# Patient Record
Sex: Female | Born: 1960
Health system: Southern US, Community
[De-identification: ages and names within clinical notes are randomized; demographics above are authoritative.]

## PROBLEM LIST (undated history)

## (undated) DIAGNOSIS — Z9889 Other specified postprocedural states: Secondary | ICD-10-CM

## (undated) DIAGNOSIS — I10 Essential (primary) hypertension: Secondary | ICD-10-CM

## (undated) DIAGNOSIS — K219 Gastro-esophageal reflux disease without esophagitis: Secondary | ICD-10-CM

## (undated) DIAGNOSIS — Z8679 Personal history of other diseases of the circulatory system: Secondary | ICD-10-CM

## (undated) DIAGNOSIS — D151 Benign neoplasm of heart: Secondary | ICD-10-CM

## (undated) DIAGNOSIS — F329 Major depressive disorder, single episode, unspecified: Secondary | ICD-10-CM

## (undated) DIAGNOSIS — M545 Low back pain, unspecified: Secondary | ICD-10-CM

## (undated) DIAGNOSIS — F419 Anxiety disorder, unspecified: Secondary | ICD-10-CM

## (undated) DIAGNOSIS — N393 Stress incontinence (female) (male): Secondary | ICD-10-CM

## (undated) DIAGNOSIS — R011 Cardiac murmur, unspecified: Secondary | ICD-10-CM

## (undated) DIAGNOSIS — M722 Plantar fascial fibromatosis: Secondary | ICD-10-CM

## (undated) DIAGNOSIS — M4800 Spinal stenosis, site unspecified: Secondary | ICD-10-CM

## (undated) DIAGNOSIS — G8929 Other chronic pain: Secondary | ICD-10-CM

## (undated) DIAGNOSIS — I513 Intracardiac thrombosis, not elsewhere classified: Secondary | ICD-10-CM

## (undated) DIAGNOSIS — A419 Sepsis, unspecified organism: Secondary | ICD-10-CM

## (undated) DIAGNOSIS — Z87442 Personal history of urinary calculi: Secondary | ICD-10-CM

## (undated) DIAGNOSIS — G43909 Migraine, unspecified, not intractable, without status migrainosus: Secondary | ICD-10-CM

## (undated) DIAGNOSIS — F32A Depression, unspecified: Secondary | ICD-10-CM

## (undated) DIAGNOSIS — I483 Typical atrial flutter: Secondary | ICD-10-CM

## (undated) DIAGNOSIS — I480 Paroxysmal atrial fibrillation: Secondary | ICD-10-CM

## (undated) DIAGNOSIS — J90 Pleural effusion, not elsewhere classified: Secondary | ICD-10-CM

## (undated) DIAGNOSIS — M1990 Unspecified osteoarthritis, unspecified site: Secondary | ICD-10-CM

## (undated) DIAGNOSIS — Z8719 Personal history of other diseases of the digestive system: Secondary | ICD-10-CM

## (undated) DIAGNOSIS — I499 Cardiac arrhythmia, unspecified: Secondary | ICD-10-CM

## (undated) DIAGNOSIS — M26609 Unspecified temporomandibular joint disorder, unspecified side: Secondary | ICD-10-CM

## (undated) HISTORY — DX: Typical atrial flutter: I48.3

## (undated) HISTORY — DX: Intracardiac thrombosis, not elsewhere classified: I51.3

## (undated) HISTORY — DX: Paroxysmal atrial fibrillation: I48.0

## (undated) HISTORY — PX: CARDIOVASCULAR STRESS TEST: SHX262

## (undated) HISTORY — PX: OTHER SURGICAL HISTORY: SHX169

---

## 1982-11-07 HISTORY — PX: APPENDECTOMY: SHX54

## 1989-07-08 HISTORY — PX: LASIK: SHX215

## 2003-08-11 ENCOUNTER — Encounter: Payer: Self-pay | Admitting: Family Medicine

## 2003-08-11 ENCOUNTER — Encounter: Admission: RE | Admit: 2003-08-11 | Discharge: 2003-08-11 | Payer: Self-pay | Admitting: Family Medicine

## 2006-08-31 ENCOUNTER — Encounter: Admission: RE | Admit: 2006-08-31 | Discharge: 2006-08-31 | Payer: Self-pay | Admitting: Gastroenterology

## 2006-11-17 ENCOUNTER — Encounter: Admission: RE | Admit: 2006-11-17 | Discharge: 2006-11-17 | Payer: Self-pay | Admitting: Gastroenterology

## 2006-12-27 ENCOUNTER — Encounter: Admission: RE | Admit: 2006-12-27 | Discharge: 2006-12-27 | Payer: Self-pay | Admitting: Gastroenterology

## 2007-03-05 ENCOUNTER — Ambulatory Visit (HOSPITAL_COMMUNITY): Admission: RE | Admit: 2007-03-05 | Discharge: 2007-03-05 | Payer: Self-pay | Admitting: Gastroenterology

## 2007-09-25 ENCOUNTER — Emergency Department (HOSPITAL_COMMUNITY): Admission: EM | Admit: 2007-09-25 | Discharge: 2007-09-25 | Payer: Self-pay | Admitting: Emergency Medicine

## 2008-11-12 ENCOUNTER — Encounter: Admission: RE | Admit: 2008-11-12 | Discharge: 2009-02-10 | Payer: Self-pay | Admitting: Orthopedic Surgery

## 2009-02-12 ENCOUNTER — Encounter: Admission: RE | Admit: 2009-02-12 | Discharge: 2009-05-04 | Payer: Self-pay | Admitting: Orthopedic Surgery

## 2009-03-03 ENCOUNTER — Encounter: Admission: RE | Admit: 2009-03-03 | Discharge: 2009-03-26 | Payer: Self-pay | Admitting: Orthopedic Surgery

## 2009-05-07 ENCOUNTER — Encounter: Admission: RE | Admit: 2009-05-07 | Discharge: 2009-08-05 | Payer: Self-pay | Admitting: Orthopedic Surgery

## 2009-07-17 ENCOUNTER — Encounter: Admission: RE | Admit: 2009-07-17 | Discharge: 2009-07-17 | Payer: Self-pay | Admitting: Orthopedic Surgery

## 2009-09-16 ENCOUNTER — Inpatient Hospital Stay (HOSPITAL_COMMUNITY): Admission: RE | Admit: 2009-09-16 | Discharge: 2009-09-19 | Payer: Self-pay | Admitting: Orthopedic Surgery

## 2009-09-16 HISTORY — PX: POSTERIOR FUSION LUMBAR SPINE: SUR632

## 2009-11-10 ENCOUNTER — Encounter: Admission: RE | Admit: 2009-11-10 | Discharge: 2010-02-08 | Payer: Self-pay | Admitting: Orthopedic Surgery

## 2010-01-11 ENCOUNTER — Encounter: Admission: RE | Admit: 2010-01-11 | Discharge: 2010-01-11 | Payer: Self-pay | Admitting: Orthopedic Surgery

## 2010-01-18 ENCOUNTER — Encounter: Admission: RE | Admit: 2010-01-18 | Discharge: 2010-01-18 | Payer: Self-pay | Admitting: Orthopedic Surgery

## 2010-04-08 ENCOUNTER — Ambulatory Visit (HOSPITAL_COMMUNITY): Admission: RE | Admit: 2010-04-08 | Discharge: 2010-04-10 | Payer: Self-pay | Admitting: Orthopedic Surgery

## 2010-04-08 HISTORY — PX: ANTERIOR CERVICAL DECOMP/DISCECTOMY FUSION: SHX1161

## 2010-06-10 ENCOUNTER — Encounter: Admission: RE | Admit: 2010-06-10 | Discharge: 2010-06-10 | Payer: Self-pay | Admitting: Orthopedic Surgery

## 2010-11-28 ENCOUNTER — Encounter: Payer: Self-pay | Admitting: Gastroenterology

## 2011-01-24 LAB — CBC
HCT: 37.3 % (ref 36.0–46.0)
Hemoglobin: 12.7 g/dL (ref 12.0–15.0)
MCV: 88.4 fL (ref 78.0–100.0)
Platelets: 231 10*3/uL (ref 150–400)
RBC: 4.23 MIL/uL (ref 3.87–5.11)

## 2011-02-09 LAB — HEMOGLOBIN AND HEMATOCRIT, BLOOD
HCT: 26.9 % — ABNORMAL LOW (ref 36.0–46.0)
Hemoglobin: 9.1 g/dL — ABNORMAL LOW (ref 12.0–15.0)
Hemoglobin: 9.7 g/dL — ABNORMAL LOW (ref 12.0–15.0)

## 2011-02-09 LAB — TYPE AND SCREEN
ABO/RH(D): A POS
Antibody Screen: NEGATIVE

## 2011-02-09 LAB — CBC
HCT: 27.6 % — ABNORMAL LOW (ref 36.0–46.0)
Hemoglobin: 12.7 g/dL (ref 12.0–15.0)
Hemoglobin: 8.6 g/dL — ABNORMAL LOW (ref 12.0–15.0)
MCHC: 34.4 g/dL (ref 30.0–36.0)
MCHC: 34.4 g/dL (ref 30.0–36.0)
MCV: 87.8 fL (ref 78.0–100.0)
MCV: 88.8 fL (ref 78.0–100.0)
RBC: 2.83 MIL/uL — ABNORMAL LOW (ref 3.87–5.11)
RBC: 3.14 MIL/uL — ABNORMAL LOW (ref 3.87–5.11)
RBC: 4.2 MIL/uL (ref 3.87–5.11)

## 2011-02-09 LAB — POCT I-STAT 4, (NA,K, GLUC, HGB,HCT)
Glucose, Bld: 113 mg/dL — ABNORMAL HIGH (ref 70–99)
Sodium: 134 mEq/L — ABNORMAL LOW (ref 135–145)

## 2011-02-09 LAB — URINALYSIS, ROUTINE W REFLEX MICROSCOPIC
Bilirubin Urine: NEGATIVE
Ketones, ur: NEGATIVE mg/dL
Nitrite: NEGATIVE
Urobilinogen, UA: 0.2 mg/dL (ref 0.0–1.0)

## 2011-02-09 LAB — URINE MICROSCOPIC-ADD ON

## 2011-03-02 ENCOUNTER — Other Ambulatory Visit: Payer: Self-pay | Admitting: Orthopedic Surgery

## 2011-03-02 DIAGNOSIS — M545 Low back pain, unspecified: Secondary | ICD-10-CM

## 2011-03-11 ENCOUNTER — Other Ambulatory Visit: Payer: Self-pay

## 2011-03-15 ENCOUNTER — Ambulatory Visit
Admission: RE | Admit: 2011-03-15 | Discharge: 2011-03-15 | Disposition: A | Discharge status: Home / Self Care | Payer: No Typology Code available for payment source | Source: Ambulatory Visit | Attending: Orthopedic Surgery | Admitting: Orthopedic Surgery

## 2011-03-15 DIAGNOSIS — M545 Low back pain, unspecified: Secondary | ICD-10-CM

## 2011-04-05 ENCOUNTER — Ambulatory Visit
Admission: RE | Admit: 2011-04-05 | Discharge: 2011-04-05 | Disposition: A | Discharge status: Home / Self Care | Payer: No Typology Code available for payment source | Source: Ambulatory Visit | Attending: Family Medicine | Admitting: Family Medicine

## 2011-04-05 ENCOUNTER — Other Ambulatory Visit: Payer: Self-pay | Admitting: Family Medicine

## 2011-04-05 DIAGNOSIS — R319 Hematuria, unspecified: Secondary | ICD-10-CM

## 2011-04-05 DIAGNOSIS — M549 Dorsalgia, unspecified: Secondary | ICD-10-CM

## 2011-04-05 DIAGNOSIS — N390 Urinary tract infection, site not specified: Secondary | ICD-10-CM

## 2011-04-05 DIAGNOSIS — R300 Dysuria: Secondary | ICD-10-CM

## 2011-07-28 ENCOUNTER — Encounter: Payer: Self-pay | Admitting: Internal Medicine

## 2011-08-08 ENCOUNTER — Ambulatory Visit: Payer: No Typology Code available for payment source

## 2011-08-11 ENCOUNTER — Ambulatory Visit: Payer: Medicaid Other | Attending: Orthopedic Surgery

## 2011-08-11 DIAGNOSIS — M25579 Pain in unspecified ankle and joints of unspecified foot: Secondary | ICD-10-CM | POA: Insufficient documentation

## 2011-08-11 DIAGNOSIS — R5381 Other malaise: Secondary | ICD-10-CM | POA: Insufficient documentation

## 2011-08-11 DIAGNOSIS — IMO0001 Reserved for inherently not codable concepts without codable children: Secondary | ICD-10-CM | POA: Insufficient documentation

## 2011-08-16 LAB — URINE MICROSCOPIC-ADD ON

## 2011-08-16 LAB — URINALYSIS, ROUTINE W REFLEX MICROSCOPIC
Glucose, UA: NEGATIVE
Ketones, ur: 15 — AB
Leukocytes, UA: NEGATIVE
Protein, ur: 100 — AB

## 2011-08-16 LAB — PREGNANCY, URINE: Preg Test, Ur: NEGATIVE

## 2011-08-22 ENCOUNTER — Ambulatory Visit: Payer: Medicaid Other

## 2011-08-29 ENCOUNTER — Encounter: Payer: Medicaid Other | Admitting: Physical Therapy

## 2011-08-31 ENCOUNTER — Ambulatory Visit: Payer: Medicaid Other

## 2011-09-05 ENCOUNTER — Ambulatory Visit: Payer: Medicaid Other

## 2011-11-26 ENCOUNTER — Emergency Department (HOSPITAL_BASED_OUTPATIENT_CLINIC_OR_DEPARTMENT_OTHER)
Admission: EM | Admit: 2011-11-26 | Discharge: 2011-11-26 | Disposition: A | Discharge status: Home / Self Care | Payer: Medicaid Other | Attending: Emergency Medicine | Admitting: Emergency Medicine

## 2011-11-26 ENCOUNTER — Encounter (HOSPITAL_BASED_OUTPATIENT_CLINIC_OR_DEPARTMENT_OTHER): Payer: Self-pay | Admitting: Emergency Medicine

## 2011-11-26 DIAGNOSIS — J069 Acute upper respiratory infection, unspecified: Secondary | ICD-10-CM | POA: Insufficient documentation

## 2011-11-26 DIAGNOSIS — G8929 Other chronic pain: Secondary | ICD-10-CM | POA: Insufficient documentation

## 2011-11-26 DIAGNOSIS — M542 Cervicalgia: Secondary | ICD-10-CM | POA: Insufficient documentation

## 2011-11-26 DIAGNOSIS — M5030 Other cervical disc degeneration, unspecified cervical region: Secondary | ICD-10-CM | POA: Insufficient documentation

## 2011-11-26 DIAGNOSIS — H9209 Otalgia, unspecified ear: Secondary | ICD-10-CM | POA: Insufficient documentation

## 2011-11-26 MED ORDER — FEXOFENADINE-PSEUDOEPHED ER 60-120 MG PO TB12: 1.0000 | ORAL_TABLET | Freq: Two times a day (BID) | ORAL | Status: DC

## 2011-11-26 MED ORDER — DEXAMETHASONE SODIUM PHOSPHATE 10 MG/ML IJ SOLN
10.0000 mg | Freq: Once | INTRAMUSCULAR | Status: AC
  Administered 2011-11-26: 10 mg via INTRAMUSCULAR
  Filled 2011-11-26: qty 1

## 2011-11-26 MED ORDER — HYDROMORPHONE HCL PF 1 MG/ML IJ SOLN
1.0000 mg | Freq: Once | INTRAMUSCULAR | Status: AC
  Administered 2011-11-26: 1 mg via INTRAMUSCULAR
  Filled 2011-11-26: qty 1

## 2011-11-26 MED ORDER — CARISOPRODOL 350 MG PO TABS: 350.0000 mg | ORAL_TABLET | Freq: Three times a day (TID) | ORAL | Status: DC

## 2011-11-26 MED ORDER — METAXALONE 800 MG PO TABS: 800.0000 mg | ORAL_TABLET | Freq: Three times a day (TID) | ORAL | Status: AC

## 2011-11-26 MED ORDER — DIAZEPAM 5 MG PO TABS
5.0000 mg | ORAL_TABLET | Freq: Once | ORAL | Status: AC
  Administered 2011-11-26: 5 mg via ORAL
  Filled 2011-11-26: qty 1

## 2011-11-26 NOTE — ED Provider Notes (Addendum)
History     CSN: [id number]  Arrival date & time 11/26/11  4098   First MD Initiated Contact with Patient 11/26/11 0920      Chief Complaint  Patient presents with  . Neck Pain    (Consider location/radiation/quality/duration/timing/severity/associated sxs/prior treatment) HPI Comments: Patient has a history of prior neck and back pain she sees Dr. Shon Baton at [facility] orthopedics for management of this she has had surgery on both her neck and her back. She is surgery on her neck for degenerative joint disease. She is currently on oxycodone 15 mg tablets prescribed by Dr. Shon Baton for her lower back. She complains today of pain throughout her neck on both sides of her neck. This started yesterday afternoon. It has progressively gotten worse since then. Denies any recent injury. Denies any radiation down her arms. Denies any numbness or weakness in her arms. Denies any fevers. She does complain of some nasal congestion and pain in her ears for about the last 3-4 days. Denies any significant cough. Denied any headache. She has tried taking her oxycodone for her neck pain, but this has not relieved it. Her neck pain is worse with any movement.  Patient is a 51 y.o. female presenting with neck pain. The history is provided by the patient.  Neck Pain  This is a recurrent problem. Pertinent negatives include no chest pain, no numbness, no headaches and no weakness.    Past Medical History  Diagnosis Date  . DDD (degenerative disc disease), cervical   . Back pain, chronic     Past Surgical History  Procedure Date  . Neck surgery   . Back surgery     History reviewed. No pertinent family history.  History  Substance Use Topics  . Smoking status: Never Smoker   . Smokeless tobacco: Never Used  . Alcohol Use: No    OB History    Grav Para Term Preterm Abortions TAB SAB Ect Mult Living                  Review of Systems  Constitutional: Negative for fever, chills, diaphoresis and  fatigue.  HENT: Positive for ear pain, congestion, rhinorrhea, neck pain and postnasal drip. Negative for sneezing.   Eyes: Negative.   Respiratory: Negative for cough, chest tightness and shortness of breath.   Cardiovascular: Negative for chest pain and leg swelling.  Gastrointestinal: Negative for nausea, vomiting, abdominal pain, diarrhea and blood in stool.  Genitourinary: Negative for frequency, hematuria, flank pain and difficulty urinating.  Musculoskeletal: Negative for back pain and arthralgias.  Skin: Negative for rash.  Neurological: Negative for dizziness, speech difficulty, weakness, numbness and headaches.    Allergies  Review of patient's allergies indicates no known allergies.  Home Medications   Current Outpatient Rx  Name Route Sig Dispense Refill  . OXYCODONE HCL 15 MG PO TABS Oral Take 15 mg by mouth every 4 (four) hours as needed.    Marland Kitchen FEXOFENADINE-PSEUDOEPHED ER 60-120 MG PO TB12 Oral Take 1 tablet by mouth every 12 (twelve) hours. 30 tablet 0  . METAXALONE 800 MG PO TABS Oral Take 1 tablet (800 mg total) by mouth 3 (three) times daily. 20 tablet 0    BP 134/89  Pulse 76  Temp(Src) 98 F (36.7 C) (Oral)  Resp 18  SpO2 98%  LMP 11/26/2011  Physical Exam  Constitutional: She is oriented to person, place, and time. She appears well-developed and well-nourished.  HENT:  Head: Normocephalic and atraumatic.  Right Ear:  External ear normal.  Left Ear: External ear normal.  Nose: Nose normal.  Mouth/Throat: Oropharynx is clear and moist.  Eyes: Pupils are equal, round, and reactive to light.  Neck: Normal range of motion. Neck supple.       Diffuse pain on palpation throughout posterior neck area. Mostly along the musculature bilaterally. And across the trapezius muscles bilaterally. There is also tenderness throughout the cervical spine. No step-offs or deformities are noted. No swelling erythema or warmth is noted.  Cardiovascular: Normal rate, regular  rhythm and normal heart sounds.   Pulmonary/Chest: Effort normal and breath sounds normal. No respiratory distress. She has no wheezes. She has no rales. She exhibits no tenderness.  Abdominal: Soft. Bowel sounds are normal. There is no tenderness. There is no rebound and no guarding.  Musculoskeletal: Normal range of motion. She exhibits no edema.  Lymphadenopathy:    She has no cervical adenopathy.  Neurological: She is alert and oriented to person, place, and time.       Normal motor function in all extremities. Normal sensation in all extremities. Radial pulses are 2+ bilaterally.  Skin: Skin is warm and dry. No rash noted.  Psychiatric: She has a normal mood and affect.    ED Course  Procedures (including critical care time)  Labs Reviewed - No data to display No results found.   1. Neck pain, musculoskeletal   2. URI (upper respiratory infection)       MDM  Patient has been given Dilaudid and Valium in the emergency department as well as a shot of Decadron. She does complain of some improvement in symptoms. She has no neurologic deficits no radiation of the pain down her arm. I do not feel that emergent MRI is needed. She does see Dr. Shon Baton with [facility] orthopedics. Advised patient to followup with her orthopedist on Monday return here for any worsening symptoms over the weekend. She will continue taking her pain medicine at home and I will also prescribe her a muscle relaxer.       Rolan Bucco, MD 11/26/11 4782  Rolan Bucco, MD 11/26/11 1209

## 2011-11-26 NOTE — ED Notes (Signed)
Nasal congestion, earache, since Tuesday.  Now having severe neck pain.  Denies fever.

## 2011-11-28 ENCOUNTER — Other Ambulatory Visit: Payer: Self-pay | Admitting: Orthopedic Surgery

## 2011-11-28 ENCOUNTER — Ambulatory Visit
Admission: RE | Admit: 2011-11-28 | Discharge: 2011-11-28 | Disposition: A | Discharge status: Home / Self Care | Payer: Medicaid Other | Source: Ambulatory Visit | Attending: Orthopedic Surgery | Admitting: Orthopedic Surgery

## 2011-11-28 ENCOUNTER — Telehealth (HOSPITAL_BASED_OUTPATIENT_CLINIC_OR_DEPARTMENT_OTHER): Payer: Self-pay | Admitting: *Deleted

## 2011-11-28 DIAGNOSIS — R52 Pain, unspecified: Secondary | ICD-10-CM

## 2011-11-28 NOTE — ED Notes (Signed)
Flexeril 5mg  po tid for muscle spasm, 15 tabs no refills, per Dr. Alden Server.  Insurance will not cover Skelexin.

## 2012-04-02 ENCOUNTER — Inpatient Hospital Stay (HOSPITAL_COMMUNITY)
Admission: EM | Admit: 2012-04-02 | Discharge: 2012-04-03 | DRG: 309 | Disposition: A | Discharge status: Home / Self Care | Payer: Medicare Other | Attending: Cardiovascular Disease | Admitting: Cardiovascular Disease

## 2012-04-02 ENCOUNTER — Emergency Department (HOSPITAL_COMMUNITY): Payer: Medicare Other

## 2012-04-02 ENCOUNTER — Encounter (HOSPITAL_COMMUNITY): Payer: Self-pay | Admitting: Neurology

## 2012-04-02 ENCOUNTER — Encounter: Payer: Self-pay | Admitting: Internal Medicine

## 2012-04-02 DIAGNOSIS — I4891 Unspecified atrial fibrillation: Principal | ICD-10-CM | POA: Diagnosis present

## 2012-04-02 DIAGNOSIS — M549 Dorsalgia, unspecified: Secondary | ICD-10-CM | POA: Diagnosis present

## 2012-04-02 DIAGNOSIS — G43909 Migraine, unspecified, not intractable, without status migrainosus: Secondary | ICD-10-CM | POA: Diagnosis present

## 2012-04-02 DIAGNOSIS — M545 Low back pain, unspecified: Secondary | ICD-10-CM | POA: Insufficient documentation

## 2012-04-02 DIAGNOSIS — Z9089 Acquired absence of other organs: Secondary | ICD-10-CM

## 2012-04-02 DIAGNOSIS — M797 Fibromyalgia: Secondary | ICD-10-CM | POA: Insufficient documentation

## 2012-04-02 DIAGNOSIS — N390 Urinary tract infection, site not specified: Secondary | ICD-10-CM | POA: Diagnosis present

## 2012-04-02 DIAGNOSIS — M5030 Other cervical disc degeneration, unspecified cervical region: Secondary | ICD-10-CM | POA: Insufficient documentation

## 2012-04-02 DIAGNOSIS — IMO0001 Reserved for inherently not codable concepts without codable children: Secondary | ICD-10-CM | POA: Diagnosis present

## 2012-04-02 DIAGNOSIS — I498 Other specified cardiac arrhythmias: Secondary | ICD-10-CM | POA: Diagnosis not present

## 2012-04-02 DIAGNOSIS — K219 Gastro-esophageal reflux disease without esophagitis: Secondary | ICD-10-CM | POA: Diagnosis present

## 2012-04-02 HISTORY — DX: Other chronic pain: G89.29

## 2012-04-02 HISTORY — DX: Low back pain: M54.5

## 2012-04-02 HISTORY — DX: Gastro-esophageal reflux disease without esophagitis: K21.9

## 2012-04-02 HISTORY — DX: Low back pain, unspecified: M54.50

## 2012-04-02 HISTORY — DX: Migraine, unspecified, not intractable, without status migrainosus: G43.909

## 2012-04-02 LAB — COMPREHENSIVE METABOLIC PANEL
ALT: 10 U/L (ref 0–35)
AST: 13 U/L (ref 0–37)
CO2: 25 mEq/L (ref 19–32)
Chloride: 105 mEq/L (ref 96–112)
GFR calc non Af Amer: >90 mL/min (ref >90)
Sodium: 137 mEq/L (ref 135–145)
Total Bilirubin: 0.3 mg/dL (ref 0.3–1.2)

## 2012-04-02 LAB — APTT: aPTT: 24 seconds (ref 24–37)

## 2012-04-02 LAB — HEPARIN LEVEL (UNFRACTIONATED): Heparin Unfractionated: 0.44 IU/mL (ref 0.30–0.70)

## 2012-04-02 LAB — CARDIAC PANEL(CRET KIN+CKTOT+MB+TROPI)
CK, MB: 1.5 ng/mL (ref 0.3–4.0)
Total CK: 32 U/L (ref 7–177)

## 2012-04-02 LAB — DIFFERENTIAL
Basophils Absolute: 0 10*3/uL (ref 0.0–0.1)
Lymphocytes Relative: 38 % (ref 12–46)
Neutro Abs: 3.8 10*3/uL (ref 1.7–7.7)

## 2012-04-02 LAB — URINALYSIS, ROUTINE W REFLEX MICROSCOPIC
Bilirubin Urine: NEGATIVE
Protein, ur: 100 mg/dL — AB
Specific Gravity, Urine: 1.016 (ref 1.005–1.030)
Urobilinogen, UA: 1 mg/dL (ref 0.0–1.0)

## 2012-04-02 LAB — CBC
Platelets: 252 10*3/uL (ref 150–400)
RDW: 13.4 % (ref 11.5–15.5)
WBC: 6.8 10*3/uL (ref 4.0–10.5)

## 2012-04-02 LAB — URINE MICROSCOPIC-ADD ON

## 2012-04-02 LAB — PROTIME-INR: INR: 0.96 (ref 0.00–1.49)

## 2012-04-02 LAB — TSH: TSH: 1.954 u[IU]/mL (ref 0.350–4.500)

## 2012-04-02 MED ORDER — SODIUM CHLORIDE 0.9 % IJ SOLN: 3.0000 mL | INTRAMUSCULAR | Status: DC | PRN

## 2012-04-02 MED ORDER — ALPRAZOLAM 0.25 MG PO TABS
0.2500 mg | ORAL_TABLET | Freq: Two times a day (BID) | ORAL | Status: DC | PRN
  Administered 2012-04-03: 0.25 mg via ORAL
  Filled 2012-04-02: qty 1

## 2012-04-02 MED ORDER — ZOLPIDEM TARTRATE 5 MG PO TABS
5.0000 mg | ORAL_TABLET | Freq: Every evening | ORAL | Status: DC | PRN
  Administered 2012-04-02: 5 mg via ORAL
  Filled 2012-04-02: qty 1

## 2012-04-02 MED ORDER — ACETAMINOPHEN 325 MG PO TABS
650.0000 mg | ORAL_TABLET | ORAL | Status: DC | PRN
  Administered 2012-04-02: 650 mg via ORAL
  Filled 2012-04-02: qty 2

## 2012-04-02 MED ORDER — DILTIAZEM HCL 100 MG IV SOLR
5.0000 mg/h | Freq: Once | INTRAVENOUS | Status: AC
  Administered 2012-04-02: 5 mg/h via INTRAVENOUS

## 2012-04-02 MED ORDER — HEPARIN BOLUS VIA INFUSION
4000.0000 [IU] | Freq: Once | INTRAVENOUS | Status: AC
  Administered 2012-04-02: 4000 [IU] via INTRAVENOUS

## 2012-04-02 MED ORDER — DILTIAZEM HCL 25 MG/5ML IV SOLN
INTRAVENOUS | Status: AC
  Filled 2012-04-02: qty 5

## 2012-04-02 MED ORDER — DILTIAZEM HCL 50 MG/10ML IV SOLN
10.0000 mg | Freq: Once | INTRAVENOUS | Status: AC
  Administered 2012-04-02: 10 mg via INTRAVENOUS

## 2012-04-02 MED ORDER — HEPARIN (PORCINE) IN NACL 100-0.45 UNIT/ML-% IJ SOLN
1100.0000 [IU]/h | INTRAMUSCULAR | Status: DC
  Administered 2012-04-02: 1100 [IU]/h via INTRAVENOUS
  Filled 2012-04-02 (×3): qty 250

## 2012-04-02 MED ORDER — NITROGLYCERIN 0.4 MG SL SUBL: 0.4000 mg | SUBLINGUAL_TABLET | SUBLINGUAL | Status: DC | PRN

## 2012-04-02 MED ORDER — LORAZEPAM 0.5 MG PO TABS: 0.5000 mg | ORAL_TABLET | Freq: Every evening | ORAL | Status: DC | PRN

## 2012-04-02 MED ORDER — SODIUM CHLORIDE 0.9 % IV SOLN: 250.0000 mL | INTRAVENOUS | Status: DC | PRN

## 2012-04-02 MED ORDER — SODIUM CHLORIDE 0.9 % IV BOLUS (SEPSIS)
500.0000 mL | Freq: Once | INTRAVENOUS | Status: AC
  Administered 2012-04-02: 500 mL via INTRAVENOUS

## 2012-04-02 MED ORDER — IOHEXOL 350 MG/ML SOLN
100.0000 mL | Freq: Once | INTRAVENOUS | Status: AC | PRN
  Administered 2012-04-02: 100 mL via INTRAVENOUS

## 2012-04-02 MED ORDER — CIPROFLOXACIN HCL 500 MG PO TABS
500.0000 mg | ORAL_TABLET | Freq: Two times a day (BID) | ORAL | Status: DC
  Administered 2012-04-02 – 2012-04-03 (×2): 500 mg via ORAL
  Filled 2012-04-02 (×4): qty 1

## 2012-04-02 MED ORDER — ADULT MULTIVITAMIN W/MINERALS CH
1.0000 | ORAL_TABLET | Freq: Every day | ORAL | Status: DC
  Administered 2012-04-03: 1 via ORAL
  Filled 2012-04-02: qty 1

## 2012-04-02 MED ORDER — ACETAMINOPHEN 325 MG PO TABS
650.0000 mg | ORAL_TABLET | Freq: Once | ORAL | Status: AC
  Administered 2012-04-02: 650 mg via ORAL
  Filled 2012-04-02: qty 2

## 2012-04-02 MED ORDER — DILTIAZEM HCL 100 MG IV SOLR
10.0000 mg/h | INTRAVENOUS | Status: DC
  Administered 2012-04-02: 20 mg/h via INTRAVENOUS
  Administered 2012-04-03 (×2): 10 mg/h via INTRAVENOUS
  Filled 2012-04-02 (×2): qty 100

## 2012-04-02 MED ORDER — OFF THE BEAT BOOK
Freq: Once | Status: AC
  Administered 2012-04-02: 17:00:00
  Filled 2012-04-02: qty 1

## 2012-04-02 MED ORDER — METOPROLOL TARTRATE 25 MG PO TABS
25.0000 mg | ORAL_TABLET | Freq: Two times a day (BID) | ORAL | Status: DC
  Administered 2012-04-02: 25 mg via ORAL
  Filled 2012-04-02 (×4): qty 1

## 2012-04-02 MED ORDER — METOPROLOL TARTRATE 1 MG/ML IV SOLN: 2.5000 mg | Freq: Four times a day (QID) | INTRAVENOUS | Status: DC | PRN

## 2012-04-02 MED ORDER — ONDANSETRON HCL 4 MG/2ML IJ SOLN: 4.0000 mg | Freq: Four times a day (QID) | INTRAMUSCULAR | Status: DC | PRN

## 2012-04-02 MED ORDER — OXYCODONE HCL 5 MG PO TABS
15.0000 mg | ORAL_TABLET | ORAL | Status: DC | PRN
  Administered 2012-04-02 – 2012-04-03 (×3): 15 mg via ORAL
  Filled 2012-04-02 (×3): qty 3

## 2012-04-02 MED ORDER — SODIUM CHLORIDE 0.9 % IJ SOLN
3.0000 mL | Freq: Two times a day (BID) | INTRAMUSCULAR | Status: DC
  Administered 2012-04-03: 3 mL via INTRAVENOUS

## 2012-04-02 NOTE — ED Notes (Signed)
Patient transported to X-ray. HR 112. A x 4. Talking, denying any cp or sob.

## 2012-04-02 NOTE — ED Notes (Signed)
Patient transported to CT. Pt a x 4. HR 121

## 2012-04-02 NOTE — H&P (Signed)
 CARDIOLOGY CONSULT NOTE   Patient ID: Miranda Taylor MRN: [id number] DOB/AGE: 51/30/1962 50 y.o.  Admit date: 04/02/2012  Primary Physician   [facility], Dr Miranda Taylor Primary Cardiologist   none Reason for Consultation   Atrial fib  HPI:Miranda Taylor is a 50 y.o. female without a history of CAD.  She has been feeling poorly for about a week. She complains of general malaise, fatigue and also had urinary frequency with burning. She took some medication she had at home which seemed to help, but her symptoms returned and she saw her family physician today. Her heart rate was noted to be elevated and irregular so she was sent to the emergency room.   In the emergency room, she was found to be in atrial fibrillation with rapid ventricular response. Her heart rate has improved on IV Cardizem, but is still significantly elevated between 101 and 150. She has never had palpitations. She has no history of presyncope or syncope. She has not had any orthostatic symptoms. She did not know her heart rate was elevated or that it was irregular until told by her family physician's staff. She has not had dyspnea on exertion or any chest pain. In the emergency room, she is resting comfortably.   Past Medical History  Diagnosis Date  . DDD (degenerative disc disease), cervical   . Back pain, chronic     Past Surgical History  Procedure Date  . Neck surgery   . Back surgery   . Appendectomy     No Known Allergies  I have reviewed the patient's current medications    . diltiazem (CARDIZEM) infusion  5-15 mg/hr Intravenous Once  . diltiazem  10 mg Intravenous Once  . sodium chloride  500 mL Intravenous Once     iohexol  Prior to Admission medications   Medication Sig Start Date End Date Taking? Authorizing Provider  LORazepam (ATIVAN) 0.5 MG tablet Take 0.5 mg by mouth at bedtime as needed. For sleep   Yes Historical Provider, MD  Multiple Vitamin (MULITIVITAMIN WITH  MINERALS) TABS Take 1 tablet by mouth daily.   Yes Historical Provider, MD  oxyCODONE (ROXICODONE) 15 MG immediate release tablet Take 15 mg by mouth every 4 (four) hours as needed. For pain   Yes Historical Provider, MD     History   Social History  . Marital Status: Married    Spouse Name: N/A    Number of Children: N/A  . Years of Education: N/A   Occupational History  . Disabled    Social History Main Topics  . Smoking status: Former Smoker -- 0.5 packs/day for 20 years    Types: Cigarettes  . Smokeless tobacco: Never Used  . Alcohol Use: No  . Drug Use: Not on file  . Sexually Active: Not on file   Social History Narrative   Pt lives with husband. Both parents are living, one with cardiac issues. Father has arrhythmia and is on blood thinners. Mother and sibs without cardiac issues.   ROS: She has issues with chronic musculoskeletal aches and pains. The UTI symptoms are described above but she denies fevers or chills. She has not seen blood in her urine although the doctor's office told her there was some. She gets occasional reflux symptoms from specific foods but these are rare. She denies melena or other GI problems. Full 14 point review of systems complete and found to be negative unless listed above.  Physical Exam: Blood pressure 114/67, pulse   110, temperature 98.6 F (37 C), temperature source Oral, resp. rate 18, last menstrual period 03/19/2012, SpO2 98.00%.  General: Well developed, well nourished, female in no acute distress Head: Eyes PERRLA, No xanthomas.   Normocephalic and atraumatic, oropharynx without edema or exudate. Dentition good Lungs: Clear bilaterally  Heart: Heart rapid and irregular rate and rhythm with S1, S2, no significant  murmur. pulses are 2+ all 4 extrem.   Neck: No carotid bruits. No lymphadenopathy.  JVD not elevated. Abdomen: Bowel sounds present, abdomen soft and tender in the mid-upper area as well as in the lower abdomen but is without  masses or hernias noted. Msk:  No spine or cva tenderness. No weakness, no joint deformities or effusions. Extremities: No clubbing or cyanosis. No edema.  Neuro: Alert and oriented X 3. No focal deficits noted. Psych:  Good affect, responds appropriately Skin: No rashes or lesions noted.  Labs:   Lab Results  Component Value Date   WBC 6.8 04/02/2012   HGB 12.3 04/02/2012   HCT 36.6 04/02/2012   MCV 85.3 04/02/2012   PLT 252 04/02/2012    Basename 04/02/12 1130  INR 0.96    Lab 04/02/12 1130  NA 137  K 4.3  CL 105  CO2 25  BUN 14  CREATININE 0.76  CALCIUM 9.4  PROT 7.0  BILITOT 0.3  ALKPHOS 79  ALT 10  AST 13  GLUCOSE 86    Basename 04/02/12 1130  CKTOTAL 32  CKMB 1.5  TROPONINI <0.30   Lab Results  Component Value Date   DDIMER 0.63* 04/02/2012   ECG:  02-Apr-2012 11:20:46  Atrial fibrillation with rapid ventricular response Vent. rate 144 BPM PR interval * ms QRS duration 74 ms QT/QTc 284/439 ms P-R-T axes * 50 63  Radiology:  Dg Chest 2 View 04/02/2012  *RADIOLOGY REPORT*  Clinical Data: New onset of atrial fibrillation.  CHEST - 2 VIEW  Comparison: None.  Findings: The heart size and pulmonary vascularity are normal and the lungs are clear.  No effusions.  No osseous abnormality.  IMPRESSION: Normal chest.  Original Report Authenticated By: Ravenscroft H. MAXWELL, M.D.   Ct Angio Chest W/cm &/or Wo Cm 04/02/2012  *RADIOLOGY REPORT*  Clinical Data: Back pain  CT ANGIOGRAPHY CHEST  Technique:  Multidetector CT imaging of the chest using the standard protocol during bolus administration of intravenous contrast. Multiplanar reconstructed images including MIPs were obtained and reviewed to evaluate the vascular anatomy.  Contrast: 100mL OMNIPAQUE IOHEXOL 350 MG/ML SOLN  Comparison: None.  Findings: Respiratory motion artifact limits the examination.  No obvious filling defects in the pulmonary arterial tree to suggest acute pulmonary thromboembolism.  Negative abnormal  mediastinal adenopathy. Small mediastinal and hilar nodes are present.  Calcified thyroid lesions bilaterally.  No pneumothorax.  No pleural fluid.  Large simple cyst in the right kidney.  IMPRESSION: No evidence of acute pulmonary thromboembolism.  Calcified thyroid masses.  Thyroid ultrasound is recommended.  Original Report Authenticated By: ARTHUR A. HOSS, M.D.    ASSESSMENT AND PLAN:   The patient was seen today by Dr , the patient evaluated and the data reviewed.  1. atrial fibrillation with rapid ventricular response: Her rate has improved with IV Cardizem which we can titrate as her blood pressure will allow. She will be anticoagulated with heparin. She may need an antiarrhythmic, if she does not convert spontaneously. The duration is unknown, so if she needs cardioversion, a TEE will be needed.  2. UTI: Her family physician started   her on Cipro and has sent a culture. We will continue the Cipro and we will followup on the culture results if the patient remains hospitalized long enough period  3. Anticoagulation: We will initially use heparin. Her chads score is 0. If we do not cardiovert her, aspirin only. If she is cardioverted, she will need anticoagulation, M.D. advise on Coumadin versus Xarelto or Pradaxa.  4. chronic back pain: She will be continued on her home medications.  Signed: Rhonda Barrett 04/02/2012, 2:32 PM Co-Sign MD    Attending Note:   The patient was seen and examined.  Agree with assessment and plan as noted above.  1. Atrial fibrillation:  Pt is fairly asymptomatic.  She has not felt well for a week but did not know that her heart was irregular. Agree with heparin for now.  Since she is basically asymptomatic, we could consider rate control and anticoagulation for a month and then proceed with cardioversion instead of a several day  Hospitalization. Continue dilt drip for now.  Add metoprolol as needed / as tolerated for additional rate control.  Could use  xarelto instead of coumadin if patient can afford it  2. Urinary tract infection:  This may be exacerbating her atrial fibrillation.  Continue cipro for now.    J. , Jr., MD, FACC 04/02/2012, 4:02 PM   

## 2012-04-02 NOTE — ED Provider Notes (Addendum)
History     CSN: [id number]  Arrival date & time 04/02/12  1101   First MD Initiated Contact with Patient 04/02/12 1116      Chief Complaint  Patient presents with  . Atrial Fibrillation    (Consider location/radiation/quality/duration/timing/severity/associated sxs/prior treatment) HPI Pt referred from her PMD's office for new onset afib. Pt states she has been fatigued x 2 weeks with urinary frequency and dysuria. No fever chills. No CP, SOB palpitations. No prev history of arrythmia. No leg swelling or pain. No recent travel or surgery.  Past Medical History  Diagnosis Date  . DDD (degenerative disc disease), cervical   . Back pain, chronic     Past Surgical History  Procedure Date  . Neck surgery   . Back surgery   . Appendectomy     No family history on file.  History  Substance Use Topics  . Smoking status: Former Smoker -- 0.5 packs/day for 20 years    Types: Cigarettes  . Smokeless tobacco: Never Used  . Alcohol Use: No    OB History    Grav Para Term Preterm Abortions TAB SAB Ect Mult Living                  Review of Systems  Constitutional: Negative for fever and chills.  Respiratory: Negative for cough and shortness of breath.   Cardiovascular: Negative for chest pain, palpitations and leg swelling.  Gastrointestinal: Negative for nausea, vomiting and abdominal pain.  Genitourinary: Positive for dysuria and frequency. Negative for flank pain.  Musculoskeletal: Negative for back pain.  Skin: Negative for rash and wound.  Neurological: Negative for dizziness, weakness, light-headedness and numbness.    Allergies  Review of patient's allergies indicates no known allergies.  Home Medications   Current Outpatient Rx  Name Route Sig Dispense Refill  . LORAZEPAM 0.5 MG PO TABS Oral Take 0.5 mg by mouth at bedtime as needed. For sleep    . ADULT MULTIVITAMIN W/MINERALS CH Oral Take 1 tablet by mouth daily.    . OXYCODONE HCL 15 MG PO TABS Oral  Take 15 mg by mouth every 4 (four) hours as needed. For pain      BP 114/67  Pulse 110  Temp(Src) 98.6 F (37 C) (Oral)  Resp 18  Ht 5\' 7"  (1.702 m)  Wt 165 lb (74.844 kg)  BMI 25.84 kg/m2  SpO2 98%  LMP 03/19/2012  Physical Exam  Nursing note and vitals reviewed. Constitutional: She is oriented to person, place, and time. She appears well-developed and well-nourished. No distress.  HENT:  Head: Normocephalic and atraumatic.  Mouth/Throat: Oropharynx is clear and moist.  Eyes: EOM are normal. Pupils are equal, round, and reactive to light.  Neck: Normal range of motion. Neck supple. No thyromegaly present.  Cardiovascular:       irreg irreg  Pulmonary/Chest: Effort normal and breath sounds normal. No respiratory distress. She has no wheezes. She has no rales.  Abdominal: Soft. Bowel sounds are normal. There is no tenderness. There is no rebound and no guarding.  Musculoskeletal: Normal range of motion. She exhibits no edema and no tenderness.       No calf tenderness or swelling  Neurological: She is alert and oriented to person, place, and time.       5/5 motor, sensation intact  Skin: Skin is warm and dry. No rash noted. No erythema.  Psychiatric: She has a normal mood and affect. Her behavior is normal.    ED  Course  Procedures (including critical care time)  Labs Reviewed  COMPREHENSIVE METABOLIC PANEL - Abnormal; Notable for the following:    Albumin 3.0 (*)    All other components within normal limits  URINALYSIS, ROUTINE W REFLEX MICROSCOPIC - Abnormal; Notable for the following:    APPearance HAZY (*)    Hgb urine dipstick MODERATE (*)    Protein, ur 100 (*)    Leukocytes, UA SMALL (*)    All other components within normal limits  D-DIMER, QUANTITATIVE - Abnormal; Notable for the following:    D-Dimer, Quant 0.63 (*)    All other components within normal limits  URINE MICROSCOPIC-ADD ON - Abnormal; Notable for the following:    Squamous Epithelial / LPF FEW  (*)    All other components within normal limits  CBC  DIFFERENTIAL  APTT  PROTIME-INR  CARDIAC PANEL(CRET KIN+CKTOT+MB+TROPI)  TSH  T4, FREE  HEPARIN LEVEL (UNFRACTIONATED)   Dg Chest 2 View  04/02/2012  *RADIOLOGY REPORT*  Clinical Data: New onset of atrial fibrillation.  CHEST - 2 VIEW  Comparison: None.  Findings: The heart size and pulmonary vascularity are normal and the lungs are clear.  No effusions.  No osseous abnormality.  IMPRESSION: Normal chest.  Original Report Authenticated By: Gwynn Burly, M.D.   Ct Angio Chest W/cm &/or Wo Cm  04/02/2012  *RADIOLOGY REPORT*  Clinical Data: Back pain  CT ANGIOGRAPHY CHEST  Technique:  Multidetector CT imaging of the chest using the standard protocol during bolus administration of intravenous contrast. Multiplanar reconstructed images including MIPs were obtained and reviewed to evaluate the vascular anatomy.  Contrast: OMNIPAQUE IOHEXOL 350 MG/ML SOLN  Comparison: None.  Findings: Respiratory motion artifact limits the examination.  No obvious filling defects in the pulmonary arterial tree to suggest acute pulmonary thromboembolism.  Negative abnormal mediastinal adenopathy. Small mediastinal and hilar nodes are present.  Calcified thyroid lesions bilaterally.  No pneumothorax.  No pleural fluid.  Large simple cyst in the right kidney.  IMPRESSION: No evidence of acute pulmonary thromboembolism.  Calcified thyroid masses.  Thyroid ultrasound is recommended.  Original Report Authenticated By: Donavan Burnet, M.D.     1. Atrial fibrillation with rapid ventricular response       Date: 04/02/2012  Rate: 144  Rhythm: atrial fibrillation  QRS Axis: normal  Intervals: normal  ST/T Wave abnormalities: normal  Conduction Disutrbances:none  Narrative Interpretation:   Old EKG Reviewed: none available   MDM   Discussed with cardiology. Will see in ED.       Loren Racer, MD 04/02/12 1517  Loren Racer, MD 04/02/12  1538

## 2012-04-02 NOTE — Progress Notes (Signed)
ANTICOAGULATION CONSULT NOTE - Initial Consult  Pharmacy Consult for Heparin Indication: atrial fibrillation  No Known Allergies  Patient Measurements: Wt: 75Kg, Ht: 67", IBW: 62 Kg Heparin Dosing Weight: 75Kg  Vital Signs: Temp: 98.6 F (37 C) (05/27 1114) Temp src: Oral (05/27 1114) BP: 114/67 mmHg (05/27 1411) Pulse Rate: 110  (05/27 1411)  Labs:  Basename 04/02/12 1130  HGB 12.3  HCT 36.6  PLT 252  APTT 24  LABPROT 13.0  INR 0.96  HEPARINUNFRC --  CREATININE 0.76  CKTOTAL 32  CKMB 1.5  TROPONINI <0.30    CrCl is unknown because there is no height on file for the current visit.   Medical History: Past Medical History  Diagnosis Date  . DDD (degenerative disc disease), cervical   . Back pain, chronic     Medications:  PTA: Lorazepam, MVI, oxycodone  Assessment: Patient admitted with afib, RVR, to start IV heparin. No prior hx of CAD noted. Noted chest CT negative for PE/dissection. Baseline labs are WNL  Goal of Therapy:  Heparin level 0.3-0.7 units/ml Monitor platelets by anticoagulation protocol: Yes   Plan:  Give 4000 units bolus x 1 Start heparin infusion at 1100 units/hr Check anti-Xa level in 8 hours and daily while on heparin Continue to monitor H&H and platelets  Allena Katz,  K 04/02/2012,3:11 PM

## 2012-04-02 NOTE — Progress Notes (Signed)
ANTICOAGULATION CONSULT NOTE - Initial Consult  Pharmacy Consult for Heparin Indication: atrial fibrillation  No Known Allergies  Patient Measurements: Wt: 75Kg, Ht: 67", IBW: 62 Kg Heparin Dosing Weight: 75Kg  Vital Signs: Temp: 98.8 F (37.1 C) (05/27 2100) Temp src: Oral (05/27 2100) BP: 119/79 mmHg (05/27 2100) Pulse Rate: 87  (05/27 2100)  Labs:  Basename 04/02/12 2134 04/02/12 1130  HGB -- 12.3  HCT -- 36.6  PLT -- 252  APTT -- 24  LABPROT -- 13.0  INR -- 0.96  HEPARINUNFRC 0.44 --  CREATININE -- 0.76  CKTOTAL -- 32  CKMB -- 1.5  TROPONINI -- <0.30    Estimated Creatinine Clearance: 88.9 ml/min (by C-G formula based on Cr of 0.76).   Medical History: Past Medical History  Diagnosis Date  . DDD (degenerative disc disease), cervical   . GERD (gastroesophageal reflux disease)   . Migraines 04/02/12    "some"  . Fibromyalgia   . Chronic lower back pain   . Recurrent UTI   . Atrial flutter with rapid ventricular response     Medications:  PTA: zepam, MVI, oxycodone  Assessment: Patient admitted with afib, RVR, to start IV heparin. No prior hx of CAD noted. Noted chest CT negative for PE/dissection. Heparin level is therapeutic.  Goal of Therapy:  Heparin level 0.3-0.7 units/ml Monitor platelets by anticoagulation protocol: Yes   Plan:  Continue heparin at 1100 units/hr. F/u am labs.  ,  [place] 04/02/2012,10:21 PM

## 2012-04-02 NOTE — ED Notes (Signed)
Pt returned from CT, went to bathroom- RN notified

## 2012-04-02 NOTE — ED Notes (Signed)
Cardiology at bedside with patient

## 2012-04-02 NOTE — ED Notes (Signed)
Per ems- Pt reporting "no energy" x 2 weeks, back pain. Went to PCP today, noticed uncontrolled AFIB 140-170, goes in and out ST then AFIB. 124/62. Denying any cp or sob. Skin warm and dry.

## 2012-04-02 NOTE — Consult Note (Signed)
CARDIOLOGY CONSULT NOTE   Patient ID: Miranda Taylor MRN: [id number] DOB/AGE: 06-27-1961 50 y.o.  Admit date: 04/02/2012  Primary Physician   [facility], Dr Karen Chafe Primary Cardiologist   none Reason for Consultation   Atrial fib  WUJ:WJXBJYN H Miranda Taylor is a 51 y.o. female without a history of CAD.  She has been feeling poorly for about a week. She complains of general malaise, fatigue and also had urinary frequency with burning. She took some medication she had at home which seemed to help, but her symptoms returned and she saw her family physician today. Her heart rate was noted to be elevated and irregular so she was sent to the emergency room.   In the emergency room, she was found to be in atrial fibrillation with rapid ventricular response. Her heart rate has improved on IV Cardizem, but is still significantly elevated between 101 and 150. She has never had palpitations. She has no history of presyncope or syncope. She has not had any orthostatic symptoms. She did not know her heart rate was elevated or that it was irregular until told by her family physician's staff. She has not had dyspnea on exertion or any chest pain. In the emergency room, she is resting comfortably.   Past Medical History  Diagnosis Date  . DDD (degenerative disc disease), cervical   . Back pain, chronic     Past Surgical History  Procedure Date  . Neck surgery   . Back surgery   . Appendectomy     No Known Allergies  I have reviewed the patient's current medications    . diltiazem (CARDIZEM) infusion  5-15 mg/hr Intravenous Once  . diltiazem  10 mg Intravenous Once  . sodium chloride  500 mL Intravenous Once     iohexol  Prior to Admission medications   Medication Sig Start Date End Date Taking? Authorizing Provider  LORazepam (ATIVAN) 0.5 MG tablet Take 0.5 mg by mouth at bedtime as needed. For sleep   Yes Historical Provider, MD  Multiple Vitamin (MULITIVITAMIN WITH  MINERALS) TABS Take 1 tablet by mouth daily.   Yes Historical Provider, MD  oxyCODONE (ROXICODONE) 15 MG immediate release tablet Take 15 mg by mouth every 4 (four) hours as needed. For pain   Yes Historical Provider, MD     History   Social History  . Marital Status: Married    Spouse Name: N/A    Number of Children: N/A  . Years of Education: N/A   Occupational History  . Disabled    Social History Main Topics  . Smoking status: Former Smoker -- 0.5 packs/day for 20 years    Types: Cigarettes  . Smokeless tobacco: Never Used  . Alcohol Use: No  . Drug Use: Not on file  . Sexually Active: Not on file   Social History Narrative   Pt lives with husband. Both parents are living, one with cardiac issues. Father has arrhythmia and is on blood thinners. Mother and sibs without cardiac issues.   ROS: She has issues with chronic musculoskeletal aches and pains. The UTI symptoms are described above but she denies fevers or chills. She has not seen blood in her urine although the doctor's office told her there was some. She gets occasional reflux symptoms from specific foods but these are rare. She denies melena or other GI problems. Full 14 point review of systems complete and found to be negative unless listed above.  Physical Exam: Blood pressure 114/67, pulse  110, temperature 98.6 F (37 C), temperature source Oral, resp. rate 18, last menstrual period 03/19/2012, SpO2 98.00%.  General: Well developed, well nourished, female in no acute distress Head: Eyes PERRLA, No xanthomas.   Normocephalic and atraumatic, oropharynx without edema or exudate. Dentition good Lungs: Clear bilaterally  Heart: Heart rapid and irregular rate and rhythm with S1, S2, no significant  murmur. pulses are 2+ all 4 extrem.   Neck: No carotid bruits. No lymphadenopathy.  JVD not elevated. Abdomen: Bowel sounds present, abdomen soft and tender in the mid-upper area as well as in the lower abdomen but is without  masses or hernias noted. Msk:  No spine or cva tenderness. No weakness, no joint deformities or effusions. Extremities: No clubbing or cyanosis. No edema.  Neuro: Alert and oriented X 3. No focal deficits noted. Psych:  Good affect, responds appropriately Skin: No rashes or lesions noted.  Labs:   Lab Results  Component Value Date   WBC 6.8 04/02/2012   HGB 12.3 04/02/2012   HCT 36.6 04/02/2012   MCV 85.3 04/02/2012   PLT 252 04/02/2012    Basename 04/02/12 1130  INR 0.96    Lab 04/02/12 1130  NA 137  K 4.3  CL 105  CO2 25  BUN 14  CREATININE 0.76  CALCIUM 9.4  PROT 7.0  BILITOT 0.3  ALKPHOS 79  ALT 10  AST 13  GLUCOSE 86    Basename 04/02/12 1130  CKTOTAL 32  CKMB 1.5  TROPONINI <0.30   Lab Results  Component Value Date   DDIMER 0.63* 04/02/2012   ECG:  02-Apr-2012 11:20:46  Atrial fibrillation with rapid ventricular response Vent. rate 144 BPM PR interval * ms QRS duration 74 ms QT/QTc 284/439 ms P-R-T axes * 50 63  Radiology:  Dg Chest 2 View 04/02/2012  *RADIOLOGY REPORT*  Clinical Data: New onset of atrial fibrillation.  CHEST - 2 VIEW  Comparison: None.  Findings: The heart size and pulmonary vascularity are normal and the lungs are clear.  No effusions.  No osseous abnormality.  IMPRESSION: Normal chest.  Original Report Authenticated By: Gwynn Burly, M.D.   Ct Angio Chest W/cm &/or Wo Cm 04/02/2012  *RADIOLOGY REPORT*  Clinical Data: Back pain  CT ANGIOGRAPHY CHEST  Technique:  Multidetector CT imaging of the chest using the standard protocol during bolus administration of intravenous contrast. Multiplanar reconstructed images including MIPs were obtained and reviewed to evaluate the vascular anatomy.  Contrast: OMNIPAQUE IOHEXOL 350 MG/ML SOLN  Comparison: None.  Findings: Respiratory motion artifact limits the examination.  No obvious filling defects in the pulmonary arterial tree to suggest acute pulmonary thromboembolism.  Negative abnormal  mediastinal adenopathy. Small mediastinal and hilar nodes are present.  Calcified thyroid lesions bilaterally.  No pneumothorax.  No pleural fluid.  Large simple cyst in the right kidney.  IMPRESSION: No evidence of acute pulmonary thromboembolism.  Calcified thyroid masses.  Thyroid ultrasound is recommended.  Original Report Authenticated By: Donavan Burnet, M.D.    ASSESSMENT AND PLAN:   The patient was seen today by Dr Elease Hashimoto, the patient evaluated and the data reviewed.  1. atrial fibrillation with rapid ventricular response: Her rate has improved with IV Cardizem which we can titrate as her blood pressure will allow. She will be anticoagulated with heparin. She may need an antiarrhythmic, if she does not convert spontaneously. The duration is unknown, so if she needs cardioversion, a TEE will be needed.  2. UTI: Her family physician started  her on Cipro and has sent a culture. We will continue the Cipro and we will followup on the culture results if the patient remains hospitalized long enough period  3. Anticoagulation: We will initially use heparin. Her chads score is 0. If we do not cardiovert her, aspirin only. If she is cardioverted, she will need anticoagulation, M.D. advise on Coumadin versus Xarelto or Pradaxa.  4. chronic back pain: She will be continued on her home medications.  Signed: Theodore Demark 04/02/2012, 2:32 PM Co-Sign MD    Attending Note:   The patient was seen and examined.  Agree with assessment and plan as noted above.  1. Atrial fibrillation:  Pt is fairly asymptomatic.  She has not felt well for a week but did not know that her heart was irregular. Agree with heparin for now.  Since she is basically asymptomatic, we could consider rate control and anticoagulation for a month and then proceed with cardioversion instead of a several day  Hospitalization. Continue dilt drip for now.  Add metoprolol as needed / as tolerated for additional rate control.  Could use  xarelto instead of coumadin if patient can afford it  2. Urinary tract infection:  This may be exacerbating her atrial fibrillation.  Continue cipro for now.   Vesta Mixer, Montez Hageman., MD, [facility] 04/02/2012, 4:02 PM

## 2012-04-03 ENCOUNTER — Other Ambulatory Visit: Payer: Self-pay

## 2012-04-03 DIAGNOSIS — M797 Fibromyalgia: Secondary | ICD-10-CM | POA: Insufficient documentation

## 2012-04-03 DIAGNOSIS — M5030 Other cervical disc degeneration, unspecified cervical region: Secondary | ICD-10-CM | POA: Insufficient documentation

## 2012-04-03 DIAGNOSIS — I4891 Unspecified atrial fibrillation: Secondary | ICD-10-CM

## 2012-04-03 DIAGNOSIS — M545 Low back pain, unspecified: Secondary | ICD-10-CM | POA: Insufficient documentation

## 2012-04-03 DIAGNOSIS — K219 Gastro-esophageal reflux disease without esophagitis: Secondary | ICD-10-CM | POA: Insufficient documentation

## 2012-04-03 HISTORY — PX: TRANSTHORACIC ECHOCARDIOGRAM: SHX275

## 2012-04-03 LAB — CBC
HCT: 34.8 % — ABNORMAL LOW (ref 36.0–46.0)
RDW: 13.7 % (ref 11.5–15.5)
WBC: 9.2 10*3/uL (ref 4.0–10.5)

## 2012-04-03 LAB — HEPARIN LEVEL (UNFRACTIONATED): Heparin Unfractionated: 0.44 IU/mL (ref 0.30–0.70)

## 2012-04-03 MED ORDER — CIPROFLOXACIN HCL 500 MG PO TABS: 500.0000 mg | ORAL_TABLET | Freq: Two times a day (BID) | ORAL | Status: AC

## 2012-04-03 MED ORDER — METOPROLOL TARTRATE 25 MG PO TABS
25.0000 mg | ORAL_TABLET | Freq: Two times a day (BID) | ORAL | Status: DC
  Filled 2012-04-03 (×2): qty 1

## 2012-04-03 MED ORDER — TRAMADOL HCL 50 MG PO TABS
50.0000 mg | ORAL_TABLET | Freq: Four times a day (QID) | ORAL | Status: DC | PRN
  Administered 2012-04-03 (×2): 50 mg via ORAL
  Filled 2012-04-03 (×2): qty 1

## 2012-04-03 MED ORDER — METOPROLOL TARTRATE 25 MG PO TABS: 25.0000 mg | ORAL_TABLET | Freq: Two times a day (BID) | ORAL | Status: DC

## 2012-04-03 MED ORDER — VENLAFAXINE HCL ER 37.5 MG PO CP24: 37.5000 mg | ORAL_CAPSULE | Freq: Every day | ORAL | Status: DC

## 2012-04-03 MED ORDER — VENLAFAXINE HCL ER 37.5 MG PO CP24
37.5000 mg | ORAL_CAPSULE | Freq: Every day | ORAL | Status: DC
  Administered 2012-04-03: 37.5 mg via ORAL
  Filled 2012-04-03: qty 1

## 2012-04-03 NOTE — Progress Notes (Signed)
Pt received discharge instructions with no further questions. New prescriptions faxed to pharmacy in [place]. Pt knows how to check heart rate and when to call the doctor. She is aware of follow up appointments. She knows signs/symptoms of heart attack and stroke. Stable for DC. Duwaine Maxin, RN

## 2012-04-03 NOTE — Discharge Summary (Signed)
Patient ID: Miranda Taylor,  MRN: [id number], DOB/AGE: 51/15/1962 51 y.o.  Admit date: 04/02/2012 Discharge date: 04/03/2012  Primary Care Provider: Terri Piedra, PA - [facility] Primary Cardiologist: Katherina Right, MD  Discharge Diagnoses Principal Problem:  *Atrial fibrillation with rapid ventricular response  Active Problems:  UTI (lower urinary tract infection)  Chronic lower back pain  Fibromyalgia  Migraines  GERD (gastroesophageal reflux disease)  DDD (degenerative disc disease), cervical  Allergies No Known Allergies  Procedures  CTA Chest 04/02/2012  IMPRESSION: No evidence of acute pulmonary thromboembolism.  Calcified thyroid masses.  Thyroid ultrasound is recommended. _____________  2D Echocardiogram 04/03/2012  Study Conclusions  - Left ventricle: The cavity size was normal. Systolic   function was normal. The estimated ejection fraction was   in the range of 55% to 60%. Wall motion was normal; there   were no regional wall motion abnormalities. - Atrial septum: No defect or patent foramen ovale was   identified. - Pulmonary arteries: PA peak pressure: 32mm Hg (S). _____________  History of Present Illness  51 y/o female w/o prior cardiac history.  She was in her USOH until approximately 1 week prior to admission, when she began to experience generalized malaise, fatigue, and urinary frequency/dysuria.  She saw her PCP on 5/27 and was noted to be tachycardic.  ECG showed afib w/ RVR.  She was also found to have a UTI.  Pt was sent to the ER where she was placed on IV diltiazem with some improvement in rate control.  D-Dimer was elevated and CTA of the chest showed no evidence of PE but did show calcified thyroid nodules with recommendation for thyroid u/s.  Pt was evaluated by cardiology and was admitted for further evaluation.  Hospital Course  Pt r/o for MI.  She converted to Sinus Rhythm on the evening of 5/27, while still on IV diltiazem.   Following conversion, she was mildly bradycardic and this AM, diltiazem was discontinued and oral beta blocker therapy was initiated.  2D echocardiogram was carried out this AM and has shown normal LV function.  She will be discharged home today in good condition.  We have arranged for a 21 day event monitor to assess for additional or asymptomatic afib.  She has a CHADS2 score of Zero and at this point is on aspirin only.  She will continue on Cipro therapy for mgmt of UTI as ordered by her PCP.  Discharge Vitals Blood pressure 98/62, pulse 52, temperature 98 F (36.7 C), temperature source Oral, resp. rate 18, height 5\' 7"  (1.702 m), weight 180 lb 12.4 oz (82 kg), last menstrual period 03/19/2012, SpO2 97.00%.  Filed Weights   04/02/12 1513 04/02/12 1637 04/03/12 0300  Weight: 165 lb (74.844 kg) 165 lb (74.844 kg) 180 lb 12.4 oz (82 kg)   Labs  CBC  Basename 04/03/12 0623 04/02/12 1130  WBC 9.2 6.8  NEUTROABS -- 3.8  HGB 11.4* 12.3  HCT 34.8* 36.6  MCV 85.9 85.3  PLT 235 252   Basic Metabolic Panel  Basename 04/02/12 1130  NA 137  K 4.3  CL 105  CO2 25  GLUCOSE 86  BUN 14  CREATININE 0.76  CALCIUM 9.4  MG --  PHOS --   Liver Function Tests  Basename 04/02/12 1130  AST 13  ALT 10  ALKPHOS 79  BILITOT 0.3  PROT 7.0  ALBUMIN 3.0*   Cardiac Enzymes  Basename 04/02/12 1130  CKTOTAL 32  CKMB 1.5  CKMBINDEX --  TROPONINI <0.30   D-Dimer  Basename 04/02/12 1130  DDIMER 0.63*   Thyroid Function Tests  Basename 04/02/12 1349  TSH 1.954  T4TOTAL --  T3FREE --  THYROIDAB --   Disposition  Pt is being discharged home today in good condition.  Follow-up Plans & Appointments  Follow-up Information    Follow up with [facility]. (We will contact you to arrange for 21 Day Heart Monitor)    Contact information:   [address] [place] [phone]      Follow up with KAPLAN,KRISTEN, PA in 1 week. (re: Thyroid calcifications requiring  Thyroid ultrasound)    Contact information:   [address] [place] [address] [phone]         Discharge Medications  Medication List  As of 04/03/2012  3:24 PM   TAKE these medications         ciprofloxacin 500 MG tablet   Commonly known as: CIPRO   Take 1 tablet (500 mg total) by mouth 2 (two) times daily.      LORazepam 0.5 MG tablet   Commonly known as: ATIVAN   Take 0.5 mg by mouth at bedtime as needed. For sleep      metoprolol tartrate 25 MG tablet   Commonly known as: LOPRESSOR   Take 1 tablet (25 mg total) by mouth 2 (two) times daily.      mulitivitamin with minerals Tabs   Take 1 tablet by mouth daily.      oxyCODONE 15 MG immediate release tablet   Commonly known as: ROXICODONE   Take 15 mg by mouth every 4 (four) hours as needed. For pain      venlafaxine XR 37.5 MG 24 hr capsule   Commonly known as: EFFEXOR-XR   Take 1 capsule (37.5 mg total) by mouth daily.           Outstanding Labs/Studies  Pt will require thyroid u/s as an outpt to re-eval calcified thyroid masses noted on CT;  21 Day Event Monitor  Duration of Discharge Encounter   Greater than 30 minutes including physician time.  Signed, Nicolasa Ducking NP 04/03/2012, 3:24 PM

## 2012-04-03 NOTE — Progress Notes (Signed)
  Echocardiogram 2D Echocardiogram has been performed.  Jorje Guild [facility] 04/03/2012, 10:55 AM

## 2012-04-03 NOTE — Discharge Instructions (Signed)
***  PLEASE REMEMBER TO BRING ALL OF YOUR MEDICATIONS TO EACH OF YOUR FOLLOW-UP OFFICE VISITS.  

## 2012-04-03 NOTE — Discharge Summary (Signed)
Patient seen and examined and history reviewed. Agree with above findings and plan. See rounding note from earlier today.   Miranda Taylor 04/03/2012 3:30 PM

## 2012-04-03 NOTE — Progress Notes (Signed)
TELEMETRY: Reviewed telemetry pt in NSR with some junctional rhythm rate 45 bpm. Converted to NSR last pm at 10:30 : Filed Vitals:   04/03/12 0007 04/03/12 0300 04/03/12 0406 04/03/12 0744  BP: 106/52  98/47 95/60  Pulse: 49  44 45  Temp: 98.3 F (36.8 C)  98 F (36.7 C) 98.6 F (37 C)  TempSrc: Oral  Oral Oral  Resp: 18  18 18   Height:      Weight:  82 kg (180 lb 12.4 oz)    SpO2: 98%  98% 97%    Intake/Output Summary (Last 24 hours) at 04/03/12 0834 Last data filed at 04/03/12 0300  Gross per 24 hour  Intake      0 ml  Output   1100 ml  Net  -1100 ml    SUBJECTIVE Feels a little nausea this am. Feels better since she converted to NSR. No chest pain or SOB.   LABS: Basic Metabolic Panel:  Basename 04/02/12 1130  NA 137  K 4.3  CL 105  CO2 25  GLUCOSE 86  BUN 14  CREATININE 0.76  CALCIUM 9.4  MG --  PHOS --   Liver Function Tests:  Basename 04/02/12 1130  AST 13  ALT 10  ALKPHOS 79  BILITOT 0.3  PROT 7.0  ALBUMIN 3.0*   No results found for this basename: LIPASE:2,AMYLASE:2 in the last 72 hours CBC:  Basename 04/03/12 0623 04/02/12 1130  WBC 9.2 6.8  NEUTROABS -- 3.8  HGB 11.4* 12.3  HCT 34.8* 36.6  MCV 85.9 85.3  PLT 235 252   Cardiac Enzymes:  Basename 04/02/12 1130  CKTOTAL 32  CKMB 1.5  CKMBINDEX --  TROPONINI <0.30   BNP: No components found with this basename: POCBNP:3 D-Dimer:  Basename 04/02/12 1130  DDIMER 0.63*   Hemoglobin A1C: No results found for this basename: HGBA1C in the last 72 hours Fasting Lipid Panel: No results found for this basename: CHOL,HDL,LDLCALC,TRIG,CHOLHDL,LDLDIRECT in the last 72 hours Thyroid Function Tests:  Basename 04/02/12 1349  TSH 1.954  T4TOTAL --  T3FREE --  THYROIDAB --   Anemia Panel: No results found for this basename: VITAMINB12,FOLATE,FERRITIN,TIBC,IRON,RETICCTPCT in the last 72 hours  Radiology/Studies:  Dg Chest 2 View  04/02/2012  *RADIOLOGY REPORT*  Clinical Data: New  onset of atrial fibrillation.  CHEST - 2 VIEW  Comparison: None.  Findings: The heart size and pulmonary vascularity are normal and the lungs are clear.  No effusions.  No osseous abnormality.  IMPRESSION: Normal chest.  Original Report Authenticated By: Gwynn Burly, M.D.   Ct Angio Chest W/cm &/or Wo Cm  04/02/2012  *RADIOLOGY REPORT*  Clinical Data: Back pain  CT ANGIOGRAPHY CHEST  Technique:  Multidetector CT imaging of the chest using the standard protocol during bolus administration of intravenous contrast. Multiplanar reconstructed images including MIPs were obtained and reviewed to evaluate the vascular anatomy.  Contrast: OMNIPAQUE IOHEXOL 350 MG/ML SOLN  Comparison: None.  Findings: Respiratory motion artifact limits the examination.  No obvious filling defects in the pulmonary arterial tree to suggest acute pulmonary thromboembolism.  Negative abnormal mediastinal adenopathy. Small mediastinal and hilar nodes are present.  Calcified thyroid lesions bilaterally.  No pneumothorax.  No pleural fluid.  Large simple cyst in the right kidney.  IMPRESSION: No evidence of acute pulmonary thromboembolism.  Calcified thyroid masses.  Thyroid ultrasound is recommended.  Original Report Authenticated By: Donavan Burnet, M.D.    PHYSICAL EXAM General: Well developed, well nourished, in no  acute distress. Head: Normocephalic, atraumatic, sclera non-icteric, no xanthomas, nares are without discharge. Neck: Negative for carotid bruits. JVD not elevated. Lungs: Clear bilaterally to auscultation without wheezes, rales, or rhonchi. Breathing is unlabored. Heart: RRR S1 S2 without murmurs, rubs, or gallops.  Abdomen: Soft, non-tender, non-distended with normoactive bowel sounds. No hepatomegaly. No rebound/guarding. No obvious abdominal masses. Msk:  Strength and tone appears normal for age. Extremities: No clubbing, cyanosis or edema.  Distal pedal pulses are 2+ and equal bilaterally. Neuro: Alert  and oriented X 3. Moves all extremities spontaneously. Psych:  Responds to questions appropriately with a normal affect.  ASSESSMENT AND PLAN: 1. Atrial fibrillation. Now converted to NSR. Bradycardic with junctional rhythm on cardizem IV. 2. UTI on cipro. 3. Calcified thyroid nodules noted on CT chest. Patient is euthyroid. Will need follow up ultrasound as outpatient. 4. Chronic back pain.  Plan: will check Echo today. Stop IV heparin and cardizem. Start ASA 81 mg daily. If HR and rhythm are stable today may be able to discharge later this afternoon/evening. It may be beneficial for her to wear an event monitor post discharge for 3 weeks.   Principal Problem:  *Atrial fibrillation with rapid ventricular response Active Problems:  UTI (lower urinary tract infection)    Signed,  [place] MD,FACC 04/03/2012 8:42 AM

## 2012-04-03 NOTE — Progress Notes (Signed)
Pt converted to SB around 1030 pm. Pt's HR 40-60's. BP 106/52. MD on call made aware new order received to decrease Cardizem to 10 ml/ hr. Will cont to monitor pt.

## 2012-04-04 ENCOUNTER — Telehealth: Payer: Self-pay

## 2012-04-05 NOTE — Progress Notes (Signed)
Utilization Review Completed.,  T5/30/2013   

## 2012-04-06 ENCOUNTER — Encounter: Payer: Self-pay | Admitting: Internal Medicine

## 2012-04-12 NOTE — Telephone Encounter (Signed)
Patient call back to canc. Appt. For monitor she said she will be going to see some one else.

## 2012-04-16 ENCOUNTER — Encounter: Payer: Self-pay | Admitting: Internal Medicine

## 2012-04-25 ENCOUNTER — Encounter: Payer: Medicaid Other | Admitting: Nurse Practitioner

## 2012-07-12 ENCOUNTER — Encounter: Payer: Self-pay | Admitting: Internal Medicine

## 2012-07-12 ENCOUNTER — Institutional Professional Consult (permissible substitution): Payer: Medicare Other | Admitting: Internal Medicine

## 2012-07-12 ENCOUNTER — Ambulatory Visit (INDEPENDENT_AMBULATORY_CARE_PROVIDER_SITE_OTHER): Payer: Medicare Other | Admitting: Internal Medicine

## 2012-07-12 VITALS — BP 100/60 | HR 60 | Ht 67.0 in | Wt 174.0 lb

## 2012-07-12 DIAGNOSIS — I4891 Unspecified atrial fibrillation: Secondary | ICD-10-CM

## 2012-07-12 MED ORDER — FLECAINIDE ACETATE 50 MG PO TABS: 50.0000 mg | ORAL_TABLET | Freq: Two times a day (BID) | ORAL | Status: DC

## 2012-07-12 NOTE — Progress Notes (Signed)
Primary Care Physician: Mady Gemma, [place] Referring Physician:  Dr Mathis Bud is a 51 y.o. female with a h/o paroxysmal atrial fibrillation who presents today for EP consultation.  She reports that she was initially diagnosed with atrial fibrillation 5/13 after presenting to her Family Doctor with increased urinary urgency and frequency.  She was found to have afib with RVR at the time and was therefore referred to [facility] for further evaluation.  She was given IV cardizem and converted to sinus rhythm.  She was placed on metoprolol and discharge.  She wore an event monitor which documented further afib. She reports that during afib she does feels palpitations as well as fatigue.  She reports mild SOB and decreased exercise tolerance.  She feels "washed out" afterwards.  She thinks that lifting her arms above her head will occasionally trigger afib.  She is unaware of any other triggers.  She finds that episodes typically occur several times per month and last 30 minutes at at time. Today, she denies symptoms of chest pain, orthopnea, PND, lower extremity edema, dizziness, presyncope, syncope, or neurologic sequela. The patient is tolerating medications without difficulties and is otherwise without complaint today.   Past Medical History  Diagnosis Date  . DDD (degenerative disc disease), cervical   . GERD (gastroesophageal reflux disease)   . Migraines 04/02/12    "some"  . Chronic lower back pain   . Recurrent UTI   . Paroxysmal atrial fibrillation   . Atrial flutter    Past Surgical History  Procedure Date  . Back surgery   . Anterior cervical decomp/discectomy fusion ~ 2009  . Appendectomy 1980's  . Posterior fusion lumbar spine ~ 2008  . Refractive surgery 1990's    bilaterally    Current Outpatient Prescriptions  Medication Sig Dispense Refill  . doxycycline (VIBRA-TABS) 100 MG tablet as directed.      Marland Kitchen ENPRESSE-28 tablet as directed.      Marland Kitchen LORazepam  (ATIVAN) 0.5 MG tablet Take 0.5 mg by mouth at bedtime as needed. For sleep      . metoprolol tartrate (LOPRESSOR) 25 MG tablet Take 50 mg by mouth 2 (two) times daily.      . Multiple Vitamin (MULITIVITAMIN WITH MINERALS) TABS Take 1 tablet by mouth daily.      . mupirocin ointment (BACTROBAN) 2 % as directed.      Marland Kitchen NEXIUM 40 MG capsule as needed.      Marland Kitchen oxyCODONE (ROXICODONE) 15 MG immediate release tablet Take 15 mg by mouth every 4 (four) hours as needed. For pain      . RESTASIS 0.05 % ophthalmic emulsion as needed.      . venlafaxine XR (EFFEXOR-XR) 37.5 MG 24 hr capsule Take 1 capsule (37.5 mg total) by mouth daily.      Marland Kitchen DISCONTD: metoprolol tartrate (LOPRESSOR) 25 MG tablet Take 1 tablet (25 mg total) by mouth 2 (two) times daily.  60 tablet  6    No Known Allergies  History   Social History  . Marital Status: Married    Spouse Name: N/A    Number of Children: N/A  . Years of Education: N/A   Occupational History  . Disabled    Social History Main Topics  . Smoking status: Former Smoker -- 0.5 packs/day for 20 years    Types: Cigarettes    Quit date: 11/07/2001  . Smokeless tobacco: Never Used  . Alcohol Use: No  . Drug Use:  No  . Sexually Active: Yes   Other Topics Concern  . Not on file   Social History Narrative   Pt lives with husband in [place].  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  4 children    Family History  Problem Relation Age of Onset  . Hypertension      ROS- All systems are reviewed and negative except as per the HPI above  Physical Exam: Filed Vitals:   07/12/12 1237  BP: 100/60  Pulse: 60  Height: 5\' 7"  (1.702 m)  Weight: 174 lb (78.926 kg)    GEN- The patient is well appearing, alert and oriented x 3 today.   Head- normocephalic, atraumatic Eyes-  Sclera clear, conjunctiva pink Ears- hearing intact Oropharynx- clear Neck- supple, no JVP Lymph- no cervical lymphadenopathy Lungs- Clear to  ausculation bilaterally, normal work of breathing Heart- Regular rate and rhythm, no murmurs, rubs or gallops, PMI not laterally displaced GI- soft, NT, ND, + BS Extremities- no clubbing, cyanosis, or edema MS- no significant deformity or atrophy Skin- no rash or lesion Psych- euthymic mood, full affect Neuro- strength and sensation are intact  EKG 04/04/12- afib with RVR Echo 04/03/12- EF 55-60%, LA size 28mm Lexiscan myoview 04/27/12- normal perfusion Event monitor 05/07/12- sinus with episodes of afib/ RVR  Assessment and Plan:

## 2012-07-12 NOTE — Patient Instructions (Addendum)
Your physician has recommended you make the following change in your medication:  1) Start Flecainide 50mg  twice daily   Your physician recommends that you schedule a follow-up appointment in: 4 weeks with Dr Johney Frame

## 2012-07-15 NOTE — Assessment & Plan Note (Signed)
The patient has symptomatic paroxysmal atrial fibrillation.  She has not tried antiarrhythmic medicine. Therapeutic strategies for afib including medicine and ablation were discussed in detail with the patient today.  She is aware that the AHA/ACC/HRS guidelines would recommend antiarrhythmic medicine as first line therapy.  I will therefore initiate flecainide 50mg  BID at this time. She will return for further assessment in 4 weeks.  IF she fails medical therapy then catheter ablation would also be a good option for her.

## 2012-07-16 ENCOUNTER — Encounter: Payer: Self-pay | Admitting: Internal Medicine

## 2012-08-08 ENCOUNTER — Institutional Professional Consult (permissible substitution): Payer: Medicare Other | Admitting: Internal Medicine

## 2012-08-20 ENCOUNTER — Ambulatory Visit: Payer: Medicare Other | Admitting: Internal Medicine

## 2012-09-05 ENCOUNTER — Ambulatory Visit (INDEPENDENT_AMBULATORY_CARE_PROVIDER_SITE_OTHER): Payer: Medicare Other | Admitting: Internal Medicine

## 2012-09-05 VITALS — BP 108/72 | HR 48 | Ht 67.0 in | Wt 182.0 lb

## 2012-09-05 DIAGNOSIS — I4891 Unspecified atrial fibrillation: Secondary | ICD-10-CM

## 2012-09-05 MED ORDER — METOPROLOL TARTRATE 25 MG PO TABS: ORAL_TABLET | ORAL | Status: DC

## 2012-09-05 NOTE — Assessment & Plan Note (Addendum)
afib is well controlled with flecainide  I will decrease metoprolol to 25mg  BID  CHADS2VASC is 1 (female).  She will therefore continue ASA at this time.  IF she fails medical therapy with flecainide then we could consider ablation as our next option. I will see her again in 6 months for follow-up.  She will contact my office if she difficulty arises in the interim.

## 2012-09-05 NOTE — Patient Instructions (Addendum)
Your physician wants you to follow-up in: 6 months with Dr Jacquiline Doe will receive a reminder letter in the mail two months in advance. If you don't receive a letter, please call our office to schedule the follow-up appointment.  Your physician has recommended you make the following change in your medication:  1) Decrease Metoprolol to 25mg  twice daily

## 2012-09-05 NOTE — Progress Notes (Signed)
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 female who presents today for routine electrophysiology followup.  Since last being seen in our clinic, the patient reports doing very well.  She is unaware of any prolonged afib since starting flecainide.  She reports no difficulty with flecainide but has had fatigue with metoprolol. Today, she denies symptoms of palpitations, chest pain, shortness of breath,  lower extremity edema, dizziness, presyncope, or syncope.  The patient is otherwise without complaint today.   Past Medical History  Diagnosis Date  . DDD (degenerative disc disease), cervical   . GERD (gastroesophageal reflux disease)   . Migraines 04/02/12    "some"  . Chronic lower back pain   . Recurrent UTI   . Paroxysmal atrial fibrillation   . Atrial flutter    Past Surgical History  Procedure Date  . Back surgery   . Anterior cervical decomp/discectomy fusion ~ 2009  . Appendectomy 1980's  . Posterior fusion lumbar spine ~ 2008  . Refractive surgery 1990's    bilaterally    Current Outpatient Prescriptions  Medication Sig Dispense Refill  . ENPRESSE-28 tablet as directed.      . flecainide (TAMBOCOR) 50 MG tablet Take 1 tablet (50 mg total) by mouth 2 (two) times daily.  60 tablet  3  . LORazepam (ATIVAN) 0.5 MG tablet Take 0.5 mg by mouth at bedtime as needed. For sleep      . metoprolol tartrate (LOPRESSOR) 25 MG tablet Take one by mouth twice daily      . Multiple Vitamin (MULITIVITAMIN WITH MINERALS) TABS Take 1 tablet by mouth daily.      Marland Kitchen NEXIUM 40 MG capsule as needed.      Marland Kitchen oxyCODONE (ROXICODONE) 15 MG immediate release tablet Take 15 mg by mouth every 4 (four) hours as needed. For pain      . predniSONE (DELTASONE) 10 MG tablet 10 mg daily.       . RESTASIS 0.05 % ophthalmic emulsion as needed.      . venlafaxine XR (EFFEXOR-XR) 37.5 MG 24 hr capsule Take 1 capsule (37.5 mg total) by mouth daily.      Marland Kitchen DISCONTD:  metoprolol tartrate (LOPRESSOR) 25 MG tablet Take 50 mg by mouth 2 (two) times daily.        Physical Exam: Filed Vitals:   09/05/12 1230  BP: 108/72  Pulse: 48  Height: 5\' 7"  (1.702 m)  Weight: 182 lb (82.555 kg)    GEN- The patient is well appearing, alert and oriented x 3 today.   Head- normocephalic, atraumatic Eyes-  Sclera clear, conjunctiva pink Ears- hearing intact Oropharynx- clear Lungs- Clear to ausculation bilaterally, normal work of breathing Heart- bradycardic regular rhythm, no murmurs, rubs or gallops, PMI not laterally displaced GI- soft, NT, ND, + BS Extremities- no clubbing, cyanosis, or edema  ekg today reveals sinus bradycardia at 48 bpm, otherwise normal ekg  Assessment and Plan:

## 2012-10-08 ENCOUNTER — Other Ambulatory Visit (HOSPITAL_COMMUNITY): Payer: Self-pay | Admitting: Cardiovascular Disease

## 2012-10-08 DIAGNOSIS — I4891 Unspecified atrial fibrillation: Secondary | ICD-10-CM

## 2012-10-08 DIAGNOSIS — I499 Cardiac arrhythmia, unspecified: Secondary | ICD-10-CM

## 2012-10-19 ENCOUNTER — Inpatient Hospital Stay (HOSPITAL_COMMUNITY): Admission: RE | Admit: 2012-10-19 | Payer: Medicare Other | Source: Ambulatory Visit

## 2013-01-23 ENCOUNTER — Other Ambulatory Visit: Payer: Self-pay

## 2013-01-23 MED ORDER — METOPROLOL TARTRATE 25 MG PO TABS: ORAL_TABLET | ORAL | Status: DC

## 2013-02-14 ENCOUNTER — Ambulatory Visit (INDEPENDENT_AMBULATORY_CARE_PROVIDER_SITE_OTHER): Payer: Medicare Other | Admitting: Internal Medicine

## 2013-02-14 ENCOUNTER — Encounter: Payer: Self-pay | Admitting: Internal Medicine

## 2013-02-14 VITALS — BP 113/69 | HR 46 | Ht 67.0 in | Wt 182.8 lb

## 2013-02-14 DIAGNOSIS — I4891 Unspecified atrial fibrillation: Secondary | ICD-10-CM

## 2013-02-14 MED ORDER — FLECAINIDE ACETATE 50 MG PO TABS: 50.0000 mg | ORAL_TABLET | Freq: Two times a day (BID) | ORAL | Status: DC

## 2013-02-14 NOTE — Progress Notes (Signed)
HPI [name] returns today for followup. She is a very pleasant 52 year old woman with paroxysmal atrial fibrillation and sinus bradycardia. In the interim, she has done well. She notes that she has symptomatic palpitations once or twice a month. These may last an hour or more. She has not sought medical attention. She is taking flecainide 50 mg daily along with metoprolol. When her heart was out of rhythm, she will take an additional metoprolol. She denies chest pain, shortness of breath, or syncope. No Known Allergies   Current Outpatient Prescriptions  Medication Sig Dispense Refill  . desvenlafaxine (PRISTIQ) 50 MG 24 hr tablet Take 50 mg by mouth daily.      Marland Kitchen ENPRESSE-28 tablet as directed.      . flecainide (TAMBOCOR) 50 MG tablet Take 1 tablet (50 mg total) by mouth 2 (two) times daily.  60 tablet  3  . LORazepam (ATIVAN) 0.5 MG tablet Take 0.5 mg by mouth at bedtime as needed. For sleep      . metoprolol tartrate (LOPRESSOR) 25 MG tablet Take one by mouth twice daily  60 tablet  4  . Multiple Vitamin (MULITIVITAMIN WITH MINERALS) TABS Take 1 tablet by mouth daily.      Marland Kitchen NEXIUM 40 MG capsule as needed.      Marland Kitchen oxyCODONE (ROXICODONE) 15 MG immediate release tablet Take 15 mg by mouth every 4 (four) hours as needed. For pain      . RESTASIS 0.05 % ophthalmic emulsion as needed.       No current facility-administered medications for this visit.     Past Medical History  Diagnosis Date  . DDD (degenerative disc disease), cervical   . GERD (gastroesophageal reflux disease)   . Migraines 04/02/12    "some"  . Chronic lower back pain   . Recurrent UTI   . Paroxysmal atrial fibrillation   . Atrial flutter     ROS:   All systems reviewed and negative except as noted in the HPI.   Past Surgical History  Procedure Laterality Date  . Back surgery    . Anterior cervical decomp/discectomy fusion  ~ 2009  . Appendectomy  1980's  . Posterior fusion lumbar spine  ~ 2008  .  Refractive surgery  1990's    bilaterally     Family History  Problem Relation Age of Onset  . Hypertension       History   Social History  . Marital Status: Married    Spouse Name: N/A    Number of Children: N/A  . Years of Education: N/A   Occupational History  . Disabled    Social History Main Topics  . Smoking status: Former Smoker -- 0.50 packs/day for 20 years    Types: Cigarettes    Quit date: 11/07/2001  . Smokeless tobacco: Never Used  . Alcohol Use: No  . Drug Use: No  . Sexually Active: Yes   Other Topics Concern  . Not on file   Social History Narrative   Pt lives with husband in [place].  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  4 children     BP 113/69  Pulse 46  Ht 5\' 7"  (1.702 m)  Wt 182 lb 12.8 oz (82.918 kg)  BMI 28.62 kg/m2  Physical Exam:  Well appearing middle-aged woman,NAD HEENT: Unremarkable Neck:  No JVD, no thyromegally Back:  No CVA tenderness Lungs:  Clear with no wheezes, rales, or rhonchi. HEART:  Regular bradycardia rhythm, no murmurs,  no rubs, no clicks Abd:  soft, positive bowel sounds, no organomegally, no rebound, no guarding Ext:  2 plus pulses, no edema, no cyanosis, no clubbing Skin:  No rashes no nodules Neuro:  CN II through XII intact, motor grossly intact  EKG - sinus bradycardia  Assess/Plan:

## 2013-02-14 NOTE — Patient Instructions (Addendum)
Your physician wants you to follow-up in: 6 months with Dr. Allred. You will receive a reminder letter in the mail two months in advance. If you don't receive a letter, please call our office to schedule the follow-up appointment.  

## 2013-02-14 NOTE — Assessment & Plan Note (Signed)
Her symptoms are fairly well-controlled. We discussed taking additional flecainide and beta blocker therapy when she has recurrent palpitations. Also, I recommended that the patient go back to 50 mg of flecainide twice daily if her symptoms increase in frequency or severity. The patient has sinus node dysfunction and I suspect that she will ultimately end up needing a permanent pacemaker in the next several years for rate support.

## 2013-02-22 ENCOUNTER — Encounter (HOSPITAL_BASED_OUTPATIENT_CLINIC_OR_DEPARTMENT_OTHER): Payer: Self-pay | Admitting: *Deleted

## 2013-02-22 ENCOUNTER — Emergency Department (HOSPITAL_BASED_OUTPATIENT_CLINIC_OR_DEPARTMENT_OTHER): Payer: Medicare Other

## 2013-02-22 ENCOUNTER — Emergency Department (HOSPITAL_BASED_OUTPATIENT_CLINIC_OR_DEPARTMENT_OTHER)
Admission: EM | Admit: 2013-02-22 | Discharge: 2013-02-22 | Disposition: A | Discharge status: Home / Self Care | Payer: Medicare Other | Attending: Emergency Medicine | Admitting: Emergency Medicine

## 2013-02-22 DIAGNOSIS — Z8679 Personal history of other diseases of the circulatory system: Secondary | ICD-10-CM | POA: Insufficient documentation

## 2013-02-22 DIAGNOSIS — Z87891 Personal history of nicotine dependence: Secondary | ICD-10-CM | POA: Insufficient documentation

## 2013-02-22 DIAGNOSIS — Z87442 Personal history of urinary calculi: Secondary | ICD-10-CM | POA: Insufficient documentation

## 2013-02-22 DIAGNOSIS — M549 Dorsalgia, unspecified: Secondary | ICD-10-CM | POA: Insufficient documentation

## 2013-02-22 DIAGNOSIS — Z8739 Personal history of other diseases of the musculoskeletal system and connective tissue: Secondary | ICD-10-CM | POA: Insufficient documentation

## 2013-02-22 DIAGNOSIS — Z79899 Other long term (current) drug therapy: Secondary | ICD-10-CM | POA: Insufficient documentation

## 2013-02-22 DIAGNOSIS — N23 Unspecified renal colic: Secondary | ICD-10-CM | POA: Insufficient documentation

## 2013-02-22 DIAGNOSIS — M545 Low back pain, unspecified: Secondary | ICD-10-CM | POA: Insufficient documentation

## 2013-02-22 DIAGNOSIS — G8929 Other chronic pain: Secondary | ICD-10-CM | POA: Insufficient documentation

## 2013-02-22 DIAGNOSIS — R350 Frequency of micturition: Secondary | ICD-10-CM | POA: Insufficient documentation

## 2013-02-22 DIAGNOSIS — Z8719 Personal history of other diseases of the digestive system: Secondary | ICD-10-CM | POA: Insufficient documentation

## 2013-02-22 LAB — URINE MICROSCOPIC-ADD ON

## 2013-02-22 LAB — URINALYSIS, ROUTINE W REFLEX MICROSCOPIC
Glucose, UA: NEGATIVE mg/dL
Protein, ur: 30 mg/dL — AB

## 2013-02-22 MED ORDER — SODIUM CHLORIDE 0.9 % IV BOLUS (SEPSIS)
1000.0000 mL | Freq: Once | INTRAVENOUS | Status: AC
  Administered 2013-02-22: 1000 mL via INTRAVENOUS

## 2013-02-22 MED ORDER — HYDROMORPHONE HCL PF 2 MG/ML IJ SOLN
2.0000 mg | Freq: Once | INTRAMUSCULAR | Status: AC
  Administered 2013-02-22: 2 mg via INTRAVENOUS
  Filled 2013-02-22 (×2): qty 1

## 2013-02-22 MED ORDER — ONDANSETRON HCL 4 MG/2ML IJ SOLN
INTRAMUSCULAR | Status: AC
  Administered 2013-02-22: 4 mg via INTRAVENOUS
  Filled 2013-02-22: qty 2

## 2013-02-22 MED ORDER — OXYCODONE-ACETAMINOPHEN 5-325 MG PO TABS: 1.0000 | ORAL_TABLET | ORAL | Status: DC | PRN

## 2013-02-22 MED ORDER — ONDANSETRON HCL 4 MG/2ML IJ SOLN
4.0000 mg | Freq: Once | INTRAMUSCULAR | Status: AC
  Administered 2013-02-22: 4 mg via INTRAVENOUS
  Filled 2013-02-22: qty 2

## 2013-02-22 MED ORDER — ONDANSETRON HCL 4 MG/2ML IJ SOLN
4.0000 mg | Freq: Once | INTRAMUSCULAR | Status: AC
  Administered 2013-02-22: 4 mg via INTRAVENOUS

## 2013-02-22 MED ORDER — ONDANSETRON 4 MG PO TBDP: 4.0000 mg | ORAL_TABLET | Freq: Three times a day (TID) | ORAL | Status: DC | PRN

## 2013-02-22 MED ORDER — FENTANYL CITRATE 0.05 MG/ML IJ SOLN
INTRAMUSCULAR | Status: AC
  Administered 2013-02-22: 50 ug via INTRAVENOUS
  Filled 2013-02-22: qty 2

## 2013-02-22 MED ORDER — HYDROMORPHONE HCL PF 2 MG/ML IJ SOLN
2.0000 mg | Freq: Once | INTRAMUSCULAR | Status: AC
  Administered 2013-02-22: 2 mg via INTRAVENOUS
  Filled 2013-02-22: qty 1

## 2013-02-22 MED ORDER — FENTANYL CITRATE 0.05 MG/ML IJ SOLN
50.0000 ug | Freq: Once | INTRAMUSCULAR | Status: AC
  Administered 2013-02-22: 50 ug via INTRAVENOUS

## 2013-02-22 NOTE — ED Notes (Signed)
Left flank pain radiating to LLQ pain x 1 day

## 2013-02-22 NOTE — ED Notes (Signed)
Patient transported to CT 

## 2013-02-22 NOTE — ED Notes (Signed)
Pt given strainers and specimen cup to take home.

## 2013-02-23 NOTE — ED Provider Notes (Signed)
History     CSN: [id number]  Arrival date & time 02/22/13  0865   First MD Initiated Contact with Patient 02/22/13 2140      Chief Complaint  Patient presents with  . Flank Pain    (Consider location/radiation/quality/duration/timing/severity/associated sxs/prior treatment) Patient is a 52 y.o. female presenting with flank pain. The history is provided by the patient and medical records. No language interpreter was used.  Flank Pain This is a new problem. The current episode started 6 to 12 hours ago (Pt had onset of left flank and LLQ pain this morning.  She thought it was from a kidney stone, as she has had kidney stones before.). Episode frequency: The pain waxes and wanes. The problem has not changed since onset.Pertinent negatives include no chest pain, no headaches and no shortness of breath. Abdominal pain: Left lower quadrant pain. Nothing aggravates the symptoms. Nothing relieves the symptoms. Treatments tried: She tried an Oxycodone, left over from prior neck surgery, without relief.    Past Medical History  Diagnosis Date  . DDD (degenerative disc disease), cervical   . GERD (gastroesophageal reflux disease)   . Migraines 04/02/12    "some"  . Chronic lower back pain   . Recurrent UTI   . Paroxysmal atrial fibrillation   . Atrial flutter   . Kidney stones     Past Surgical History  Procedure Laterality Date  . Back surgery    . Anterior cervical decomp/discectomy fusion  ~ 2009  . Appendectomy  1980's  . Posterior fusion lumbar spine  ~ 2008  . Refractive surgery  1990's    bilaterally    Family History  Problem Relation Age of Onset  . Hypertension      History  Substance Use Topics  . Smoking status: Former Smoker -- 0.50 packs/day for 20 years    Types: Cigarettes    Quit date: 11/07/2001  . Smokeless tobacco: Never Used  . Alcohol Use: No    OB History   Grav Para Term Preterm Abortions TAB SAB Ect Mult Living                  Review of  Systems  Constitutional: Negative for fever and chills.  Eyes: Negative.   Respiratory: Negative.  Negative for shortness of breath.   Cardiovascular: Negative.  Negative for chest pain.  Gastrointestinal: Negative.  Abdominal pain: Left lower quadrant pain.  Genitourinary: Positive for flank pain.  Musculoskeletal: Positive for back pain.  Skin: Negative.   Neurological: Negative.  Negative for headaches.  Psychiatric/Behavioral: Negative.     Allergies  Review of patient's allergies indicates no known allergies.  Home Medications   Current Outpatient Rx  Name  Route  Sig  Dispense  Refill  . desvenlafaxine (PRISTIQ) 50 MG 24 hr tablet   Oral   Take 50 mg by mouth daily.         Marland Kitchen ENPRESSE-28 tablet      as directed.         . flecainide (TAMBOCOR) 50 MG tablet   Oral   Take 1 tablet (50 mg total) by mouth 2 (two) times daily.   180 tablet   3   . LORazepam (ATIVAN) 0.5 MG tablet   Oral   Take 0.5 mg by mouth at bedtime as needed. For sleep         . metoprolol tartrate (LOPRESSOR) 25 MG tablet      Take one by mouth twice daily  60 tablet   4   . Multiple Vitamin (MULITIVITAMIN WITH MINERALS) TABS   Oral   Take 1 tablet by mouth daily.         Marland Kitchen NEXIUM 40 MG capsule      as needed.         Marland Kitchen oxyCODONE (ROXICODONE) 15 MG immediate release tablet   Oral   Take 15 mg by mouth every 4 (four) hours as needed. For pain         . RESTASIS 0.05 % ophthalmic emulsion      as needed.         . ondansetron (ZOFRAN ODT) 4 MG disintegrating tablet   Oral   Take 1 tablet (4 mg total) by mouth every 8 (eight) hours as needed for nausea.   12 tablet   0   . oxyCODONE-acetaminophen (PERCOCET/ROXICET) 5-325 MG per tablet   Oral   Take 1 tablet by mouth every 4 (four) hours as needed for pain.   20 tablet   0     BP 114/58  Pulse 57  Temp(Src) 98.4 F (36.9 C) (Oral)  Resp 18  Ht 5\' 7"  (1.702 m)  Wt 175 lb (79.379 kg)  BMI 27.4 kg/m2   SpO2 100%  LMP 12/25/2012  Physical Exam  Nursing note and vitals reviewed. Constitutional: She is oriented to person, place, and time. She appears well-developed and well-nourished.  In moderate distress with left flank and LLQ pain.  HENT:  Head: Normocephalic and atraumatic.  Right Ear: External ear normal.  Left Ear: External ear normal.  Mouth/Throat: Oropharynx is clear and moist.  Scar on left anterior neck from prior cervical fusion.  Eyes: Conjunctivae and EOM are normal. Pupils are equal, round, and reactive to light. No scleral icterus.  Neck: Normal range of motion. Neck supple.  Cardiovascular: Normal rate, regular rhythm and normal heart sounds.   Pulmonary/Chest: Effort normal and breath sounds normal.  Abdominal: Soft. Bowel sounds are normal. She exhibits no distension and no mass. There is no tenderness.  She localizes her pain to the LLQ and L flank, but there is no point of tenderness.  Musculoskeletal: Normal range of motion. She exhibits no edema and no tenderness.  Neurological: She is alert and oriented to person, place, and time.  No sensory or motor deficit.  Skin: Skin is warm and dry.  Psychiatric: She has a normal mood and affect. Her behavior is normal.    ED Course  Procedures (including critical care time)  Labs Reviewed  URINALYSIS, ROUTINE W REFLEX MICROSCOPIC - Abnormal; Notable for the following:    Color, Urine AMBER (*)    APPearance CLOUDY (*)    Hgb urine dipstick LARGE (*)    Bilirubin Urine SMALL (*)    Protein, ur 30 (*)    All other components within normal limits  URINE MICROSCOPIC-ADD ON - Abnormal; Notable for the following:    Squamous Epithelial / LPF FEW (*)    All other components within normal limits  PREGNANCY, URINE   Ct Abdomen Pelvis Wo Contrast  02/22/2013  *RADIOLOGY REPORT*  Clinical Data: Left flank and left lower quadrant pain, history kidney stones  CT ABDOMEN AND PELVIS WITHOUT CONTRAST  Technique:   Multidetector CT imaging of the abdomen and pelvis was performed following the standard protocol without intravenous contrast. Sagittal and coronal MPR images reconstructed from axial data set.  Comparison: 04/05/2011  Findings: Dependent atelectasis at lung bases. Right renal cysts, largest upper  pole 5.1 x 4.5 cm image 27. No urinary tract calcification, hydronephrosis, or ureteral dilatation. Abnormal axis right kidney, hilum directed anteriorly. Within limits of a nonenhanced exam, liver, spleen, pancreas, kidneys, and adrenal glands otherwise normal appearance.  Appendix surgically absent. Bladder and ureters unremarkable. Unremarkable uterus and left ovary with small right ovarian cyst 2.6 x 2.2 cm. Scattered atherosclerotic calcifications. Stomach and small bowel loops normal appearance for technique. Question area of subtle wall thickening of the distal descending colon with minimal hazy infiltration of pericolic fat raising question of subtle colitis. No evidence of perforation or abscess.  No mass, adenopathy, free fluid, or free air. Prior lumbar fusion at L4-L5. No acute osseous findings.  IMPRESSION: Right renal cysts. No evidence of urinary tract calcification or obstruction. Question short segment of subtle colitis at distal descending colon.   Original Report Authenticated By: Ulyses Southward, M.D.    Pt had been medicated with IV Fentanyl per protocol.  She was further medicated with IV Dilaudid and Zofran.  CT did not show a ureteral calculus, though her symptoms suggested renal colic.  Advised Rx with Percocet for pain, pt to strain her urine to see if she catches a stone.   1. Renal colic       Carleene Cooper III, MD 02/23/13 1324

## 2013-03-21 ENCOUNTER — Encounter: Payer: Self-pay | Admitting: *Deleted

## 2013-03-21 ENCOUNTER — Encounter: Payer: Self-pay | Admitting: Cardiovascular Disease

## 2013-03-22 ENCOUNTER — Encounter: Payer: Self-pay | Admitting: Cardiovascular Disease

## 2013-03-22 ENCOUNTER — Ambulatory Visit (INDEPENDENT_AMBULATORY_CARE_PROVIDER_SITE_OTHER): Payer: Medicare Other | Admitting: Cardiovascular Disease

## 2013-03-22 VITALS — BP 98/68 | HR 46 | Ht 67.0 in | Wt 179.0 lb

## 2013-03-22 DIAGNOSIS — I4891 Unspecified atrial fibrillation: Secondary | ICD-10-CM

## 2013-03-22 DIAGNOSIS — R0683 Snoring: Secondary | ICD-10-CM

## 2013-03-22 DIAGNOSIS — R0609 Other forms of dyspnea: Secondary | ICD-10-CM

## 2013-03-22 DIAGNOSIS — R0989 Other specified symptoms and signs involving the circulatory and respiratory systems: Secondary | ICD-10-CM

## 2013-03-22 NOTE — Assessment & Plan Note (Signed)
She gets occasional breakthrough PAF when she drinks caffeinated beverages. She also complains of symptoms compatible with obstructive sleep apnea.this may certainly be contributing to her episodes of PAF. We will obtain an outpatient sleep study.

## 2013-03-22 NOTE — Progress Notes (Signed)
03/22/2013 Miranda Taylor   16-Apr-1961  [id number]  Primary Physician Mady Gemma, PA-C Primary Cardiologist: Erlene Quan MD FACP,FACC,FAHA, FSCAI   HPI:  The patient is a delightful 52 year old mildly overweight married Caucasian female, mother of 4 and grandmother to 2 grandchildren, whom I last saw in the office 3 months ago. She has a history of symptomatic paroxysmal atrial fibrillation with a low [place] score on beta-blocker and aspirin. She had a normal 2D echo and stress test. I did an event monitor that showed multiple episodes of PAF, which she is symptomatic from with complaints of shortness of breath and fatigue. I referred her to Dr. Hillis Range at [facility], Electrophysiologist, who placed her on flecainide 50 p.o. b.i.d. empirically. Her symptoms have improved. Since her previous office visits 09/27/12 she has had an occasional episode of PAF primarily brought on by drinking caffeinated beverages. She also complains of symptoms compatible with obstructive sleep apnea.      Current Outpatient Prescriptions  Medication Sig Dispense Refill  . desvenlafaxine (PRISTIQ) 50 MG 24 hr tablet Take 100 mg by mouth daily.       Marland Kitchen ENPRESSE-28 tablet as directed.      . flecainide (TAMBOCOR) 50 MG tablet Take 1 tablet (50 mg total) by mouth 2 (two) times daily.  180 tablet  3  . LORazepam (ATIVAN) 0.5 MG tablet Take 0.5 mg by mouth at bedtime as needed. For sleep      . metoprolol tartrate (LOPRESSOR) 25 MG tablet Take one by mouth twice daily  60 tablet  4  . Multiple Vitamin (MULITIVITAMIN WITH MINERALS) TABS Take 1 tablet by mouth daily.      Marland Kitchen NEXIUM 40 MG capsule as needed.      . ondansetron (ZOFRAN ODT) 4 MG disintegrating tablet Take 1 tablet (4 mg total) by mouth every 8 (eight) hours as needed for nausea.  12 tablet  0  . oxyCODONE (ROXICODONE) 15 MG immediate release tablet Take 15 mg by mouth every 4 (four) hours as needed. For pain      . RESTASIS 0.05 %  ophthalmic emulsion as needed.      . rizatriptan (MAXALT-MLT) 10 MG disintegrating tablet 10 mg as needed.       Marland Kitchen oxyCODONE-acetaminophen (PERCOCET/ROXICET) 5-325 MG per tablet Take 1 tablet by mouth every 4 (four) hours as needed for pain.  20 tablet  0   No current facility-administered medications for this visit.    No Known Allergies  History   Social History  . Marital Status: Married    Spouse Name: N/A    Number of Children: N/A  . Years of Education: N/A   Occupational History  . Disabled    Social History Main Topics  . Smoking status: Former Smoker -- 0.50 packs/day for 20 years    Types: Cigarettes    Quit date: 11/07/2001  . Smokeless tobacco: Never Used  . Alcohol Use: No  . Drug Use: No  . Sexually Active: Yes    Birth Control/ Protection: Pill   Other Topics Concern  . Not on file   Social History Narrative   Pt lives with husband in [place].  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  Cardiovascular: negative for chest pain, dyspnea on exertion, edema, orthopnea, palpitations, paroxysmal nocturnal dyspnea or shortness of breath Dermatological: negative for rash Respiratory: negative  for cough or wheezing Urologic: negative for hematuria Abdominal: negative for nausea, vomiting, diarrhea, bright red blood per rectum, melena, or hematemesis Neurologic: negative for visual changes, syncope, or dizziness All other systems reviewed and are otherwise negative except as noted above.    Blood pressure 98/68, pulse 46, height 5\' 7"  (1.702 m), weight 179 lb (81.194 kg), last menstrual period 12/25/2012.  General appearance: alert and no distress Neck: no adenopathy, no carotid bruit, no JVD, supple, symmetrical, trachea midline and thyroid not enlarged, symmetric, no tenderness/mass/nodules Lungs: clear to auscultation  bilaterally Heart: regular rate and rhythm, S1, S2 normal, no murmur, click, rub or gallop Extremities: extremities normal, atraumatic, no cyanosis or edema  EKG sinus bradycardia at 46 without ST or T wave changes.  ASSESSMENT AND PLAN:   Atrial fibrillation She gets occasional breakthrough PAF when she drinks caffeinated beverages. She also complains of symptoms compatible with obstructive sleep apnea.this may certainly be contributing to her episodes of PAF. We will obtain an outpatient sleep study.      [address] JMD, Nicholes Calamity, [place] 03/22/2013 10:04 AM

## 2013-03-22 NOTE — Patient Instructions (Addendum)
  Your physician wants you to follow-up in: 12 MONTHS You will receive a reminder letter in the mail one month in advance. If you don't receive a letter, please call our office to schedule the follow-up appointment.   NO MEDICATION CHANGES  Your physician has ordered the following tests: SLEEP STUDY AND A TREADMILL TEST

## 2013-03-27 ENCOUNTER — Ambulatory Visit (HOSPITAL_COMMUNITY)
Admission: RE | Admit: 2013-03-27 | Discharge: 2013-03-27 | Disposition: A | Discharge status: Home / Self Care | Payer: Medicare Other | Source: Ambulatory Visit | Attending: Cardiology | Admitting: Cardiology

## 2013-03-27 DIAGNOSIS — I4891 Unspecified atrial fibrillation: Secondary | ICD-10-CM | POA: Insufficient documentation

## 2013-04-09 ENCOUNTER — Other Ambulatory Visit: Payer: Self-pay

## 2013-04-09 MED ORDER — METOPROLOL TARTRATE 25 MG PO TABS: ORAL_TABLET | ORAL | Status: DC

## 2013-04-15 ENCOUNTER — Ambulatory Visit (HOSPITAL_BASED_OUTPATIENT_CLINIC_OR_DEPARTMENT_OTHER): Payer: Medicare Other | Attending: Cardiovascular Disease | Admitting: Radiology

## 2013-04-15 VITALS — Ht 67.0 in | Wt 170.0 lb

## 2013-04-15 DIAGNOSIS — G4730 Sleep apnea, unspecified: Secondary | ICD-10-CM | POA: Insufficient documentation

## 2013-04-15 DIAGNOSIS — G4733 Obstructive sleep apnea (adult) (pediatric): Secondary | ICD-10-CM

## 2013-04-15 DIAGNOSIS — I4891 Unspecified atrial fibrillation: Secondary | ICD-10-CM | POA: Insufficient documentation

## 2013-04-15 DIAGNOSIS — G4710 Hypersomnia, unspecified: Secondary | ICD-10-CM | POA: Insufficient documentation

## 2013-04-15 DIAGNOSIS — R0609 Other forms of dyspnea: Secondary | ICD-10-CM | POA: Insufficient documentation

## 2013-04-15 DIAGNOSIS — R0989 Other specified symptoms and signs involving the circulatory and respiratory systems: Secondary | ICD-10-CM | POA: Insufficient documentation

## 2013-04-15 HISTORY — PX: OTHER SURGICAL HISTORY: SHX169

## 2013-04-20 NOTE — Procedures (Signed)
Miranda Taylor, Miranda Taylor               ACCOUNT NO.:  [id number]  MEDICAL RECORD NO.:  [id number]          PATIENT TYPE:  OUT  LOCATION:  SLEEP CENTER                 FACILITY[facility]  [facility]  PHYSICIAN:   D. Maple Hudson, MD, FCCP, FACPDATE OF BIRTH:  1961-09-02  DATE OF STUDY:  04/15/2013                           NOCTURNAL POLYSOMNOGRAM  REFERRING PHYSICIAN:  Nanetta Batty, M.D.  INDICATION FOR STUDY:  Hypersomnia with sleep apnea.  EPWORTH SLEEPINESS SCORE:  8/24.  BMI 26.6, weight 170 pounds, height 67 inches, neck 14 inches.  MEDICATIONS:  Home medications are charted and reviewed.  SLEEP ARCHITECTURE:  Total sleep time minutes with sleep efficiency 64.4%.  Stage I was 10.7%, stage II 89.1%, stage III 0.2%, REM absent. Sleep latency 51 minutes, awake after sleep onset 10.5 minutes.  Arousal index 19.3.  BEDTIME MEDICATION:  None.  RESPIRATORY DATA:  Apnea-hypotony index (AHI) 1.3 per hour.  A total of 5 events was scored including 2 obstructive apneas and 3 hypopneas. Events were more common while supine.  There were not enough events to permit split titration CPAP protocol.  OXYGEN DATA:  Minimal snoring with oxygen desaturation to a nadir of 93% and mean oxygen saturation through the study of 96.3% on room air.  CARDIAC DATA:  Sinus rhythm with intervals of atrial fibrillation with maximum heart rate 196 per minute.  MOVEMENT-PARASOMNIA:  Periodic limb movement.  A total of 188 limb jerks were counted of which 3 were associated with arousals or awakening for periodic limb movement with arousal index of 0.8 per hour.  Bathroom x1.  IMPRESSION/RECOMMENDATION: 1. Sleep time was relatively short with sleep onset shortly before     midnight and spontaneous awakening by 4:00 a.m.  REM was absent.     No bedtime medication was taken. 2. Occasional respiratory event with sleep disturbance, within normal     limits.  AHI 1.3 per hour (the normal range for adults is from 0-5  events per hour).  Very mild snoring with oxygen desaturation to a     nadir of 93% and mean oxygen saturation through the study of 96.3%     on room air. 3. Frequent limb jerks, but with little direct impact on sleep     quality.  At future clinical meetings consider discussing sleep     impact of limb movements.      D. Maple Hudson, MD, [facility], FACP Diplomate, American Board of Sleep Medicine    CDY/MEDQ  D:  04/20/2013 12:16:10  T:  04/20/2013 14:10:25  Job:  [id number]

## 2013-04-30 ENCOUNTER — Telehealth: Payer: Self-pay | Admitting: *Deleted

## 2013-04-30 NOTE — Telephone Encounter (Signed)
Message copied by Marella Bile on Tue Apr 30, 2013 11:41 AM ------      Message from: Runell Gess      Created: Sun Apr 28, 2013  2:43 PM       Call and tell normal ------

## 2013-04-30 NOTE — Telephone Encounter (Signed)
i informed patient that gxt was normal

## 2013-08-26 DIAGNOSIS — F419 Anxiety disorder, unspecified: Secondary | ICD-10-CM

## 2013-08-26 DIAGNOSIS — F32A Depression, unspecified: Secondary | ICD-10-CM | POA: Insufficient documentation

## 2013-08-26 DIAGNOSIS — F329 Major depressive disorder, single episode, unspecified: Secondary | ICD-10-CM | POA: Insufficient documentation

## 2013-08-29 ENCOUNTER — Other Ambulatory Visit: Payer: Self-pay | Admitting: *Deleted

## 2013-08-29 MED ORDER — METOPROLOL TARTRATE 25 MG PO TABS: ORAL_TABLET | ORAL | Status: DC

## 2013-09-17 ENCOUNTER — Encounter: Payer: Self-pay | Admitting: Internal Medicine

## 2013-09-17 ENCOUNTER — Ambulatory Visit (INDEPENDENT_AMBULATORY_CARE_PROVIDER_SITE_OTHER): Payer: Medicare Other | Admitting: Internal Medicine

## 2013-09-17 VITALS — BP 113/74 | HR 52 | Ht 67.0 in | Wt 175.0 lb

## 2013-09-17 DIAGNOSIS — I4891 Unspecified atrial fibrillation: Secondary | ICD-10-CM

## 2013-09-17 NOTE — Patient Instructions (Signed)
Your physician recommends that you continue on your current medications as directed. Please refer to the Current Medication list given to you today.  Your physician wants you to follow-up in: 6 months with Dr. Taylor.  You will receive a reminder letter in the mail two months in advance. If you don't receive a letter, please call our office to schedule the follow-up appointment.  

## 2013-09-18 NOTE — Progress Notes (Signed)
HPI [name] returns today for followup. She is a very pleasant 52 year old woman with paroxysmal atrial fibrillation and sinus bradycardia. In the interim, she has done well. She notes that she has symptomatic palpitations once or twice a month, most recently when she does stenuous activity like working in the yard. These may last an hour or more. She has not sought medical attention. She is taking flecainide 50 mg daily along with metoprolol. When her heart was out of rhythm, she will take an additional metoprolol and sometimes an additional flecainide. She denies chest pain, shortness of breath, or syncope. No Known Allergies   Current Outpatient Prescriptions  Medication Sig Dispense Refill  . desvenlafaxine (PRISTIQ) 50 MG 24 hr tablet Take 100 mg by mouth daily.       . flecainide (TAMBOCOR) 50 MG tablet Take 1 tablet (50 mg total) by mouth 2 (two) times daily.  180 tablet  3  . hydrochlorothiazide (MICROZIDE) 12.5 MG capsule 12.5 mg as needed.      Marland Kitchen LORazepam (ATIVAN) 0.5 MG tablet Take 0.5 mg by mouth at bedtime as needed. For sleep      . methocarbamol (ROBAXIN) 500 MG tablet 500 mg as needed.      . metoprolol tartrate (LOPRESSOR) 25 MG tablet Take one by mouth twice daily  60 tablet  6  . Multiple Vitamin (MULITIVITAMIN WITH MINERALS) TABS Take 1 tablet by mouth daily.      Marland Kitchen NEXIUM 40 MG capsule as needed.      . ondansetron (ZOFRAN ODT) 4 MG disintegrating tablet Take 1 tablet (4 mg total) by mouth every 8 (eight) hours as needed for nausea.  12 tablet  0  . oxyCODONE (ROXICODONE) 15 MG immediate release tablet Take 15 mg by mouth every 4 (four) hours as needed. For pain      . oxyCODONE-acetaminophen (PERCOCET/ROXICET) 5-325 MG per tablet Take 1 tablet by mouth every 4 (four) hours as needed for pain.  20 tablet  0  . RESTASIS 0.05 % ophthalmic emulsion as needed.      . rizatriptan (MAXALT-MLT) 10 MG disintegrating tablet 10 mg as needed.        No current facility-administered  medications for this visit.     Past Medical History  Diagnosis Date  . DDD (degenerative disc disease), cervical   . GERD (gastroesophageal reflux disease)   . Migraines 04/02/12    "some"  . Chronic lower back pain   . Recurrent UTI   . Paroxysmal atrial fibrillation   . Atrial flutter   . Kidney stones   . Paroxysmal atrial fibrillation     ROS:   All systems reviewed and negative except as noted in the HPI.   Past Surgical History  Procedure Laterality Date  . Back surgery    . Anterior cervical decomp/discectomy fusion  ~ 2009  . Appendectomy  1980's  . Posterior fusion lumbar spine  ~ 2008  . Refractive surgery  1990's    bilaterally  . Doppler echocardiography  04/03/2012    2-D  . Nm myoview ltd      resting images reveal a normal pattern of perfusion in all refions, the post stress muyocardial perfusion images show a normal pattern of perfusion in all regions, the post stress left ventricle is normal in Foley, the rest left vehtricle is normal in size, there is no scintigraphix evidence of inducible muyocardial ischemia, the post stress ejection fraction is 61%, normal myocardial perfusion stu  Family History  Problem Relation Age of Onset  . Hypertension       History   Social History  . Marital Status: Married    Spouse Name: N/A    Number of Children: N/A  . Years of Education: N/A   Occupational History  . Disabled    Social History Main Topics  . Smoking status: Former Smoker -- 0.50 packs/day for 20 years    Types: Cigarettes    Quit date: 11/07/2001  . Smokeless tobacco: Never Used  . Alcohol Use: No  . Drug Use: No  . Sexual Activity: Yes    Birth Control/ Protection: Pill   Other Topics Concern  . Not on file   Social History Narrative   Pt lives with husband in [place].  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  4 children     BP 113/74  Pulse 52  Ht 5\' 7"  (1.702 m)  Wt 175 lb (79.379  kg)  BMI 27.40 kg/m2  Physical Exam:  Well appearing middle-aged woman,NAD HEENT: Unremarkable Neck:  No JVD, no thyromegally Back:  No CVA tenderness Lungs:  Clear with no wheezes, rales, or rhonchi. HEART:  Regular bradycardia rhythm, no murmurs, no rubs, no clicks Abd:  soft, positive bowel sounds, no organomegally, no rebound, no guarding Ext:  2 plus pulses, no edema, no cyanosis, no clubbing Skin:  No rashes no nodules Neuro:  CN II through XII intact, motor grossly intact  EKG - sinus bradycardia  Assess/Plan:

## 2013-09-18 NOTE — Assessment & Plan Note (Signed)
She is predominantly in NSR. I have asked the patient to call us if her atrial fib worsens. I have instructed her to take an additional one or two doses of flecainide if her atrial fib recurs and she is symptomatic.

## 2013-09-27 ENCOUNTER — Ambulatory Visit (INDEPENDENT_AMBULATORY_CARE_PROVIDER_SITE_OTHER): Payer: Medicare Other | Admitting: Psychiatry

## 2013-09-27 ENCOUNTER — Encounter (HOSPITAL_COMMUNITY): Payer: Self-pay | Admitting: Psychiatry

## 2013-09-27 DIAGNOSIS — F3289 Other specified depressive episodes: Secondary | ICD-10-CM

## 2013-09-27 DIAGNOSIS — F32A Depression, unspecified: Secondary | ICD-10-CM

## 2013-09-27 DIAGNOSIS — F329 Major depressive disorder, single episode, unspecified: Secondary | ICD-10-CM

## 2013-09-27 MED ORDER — DESVENLAFAXINE SUCCINATE ER 50 MG PO TB24: 150.0000 mg | ORAL_TABLET | Freq: Every day | ORAL | Status: DC

## 2013-09-27 NOTE — Progress Notes (Signed)
Psychiatric Assessment Adult  Patient Identification:  Miranda Taylor Date of Evaluation:  09/27/2013 Chief Complaint: "cannot focus, stay on task" History of Chief Complaint:  Patient is a 52 yo woman , married with 4 children aged 45, 6, 87 and 70. She also has 3 grandchildren. She reports being on disability currently, does some cleaning. Patient reports she has been having trouble focusing , unable to sit down and relax. States she cannot sit and watch TV. Reports since the beginning of summer she has felt down, on a scale of 1-10, 10 being her best, her mood is at a 5.She has not been enjoying her usual activities. Has not been sleeping well. Fair appetite. Denies any particular stressors. Denies use of drugs or alcohol. Denies suicidal thoughts, denies psychiatric hospitalizations. Has never seen a psychiatrist previously. Has been on Pristiq qt 100mg  for a year.  HPI Review of Systems Physical Exam  Depressive Symptoms: anhedonia, psychomotor agitation, fatigue, difficulty concentrating, impaired memory, anxiety, insomnia, loss of energy/fatigue, disturbed sleep,  (Hypo) Manic Symptoms:   Elevated Mood:  No Irritable Mood:  Yes Grandiosity:  No Distractibility:  Yes Labiality of Mood:  Yes Delusions:  No Hallucinations:  No Impulsivity:  No Sexually Inappropriate Behavior:  No Financial Extravagance:  No Flight of Ideas:  No  Anxiety Symptoms: Excessive Worry:  No Panic Symptoms:  No Agoraphobia:  No Obsessive Compulsive: Negative  Symptoms: None, Specific Phobias:  No Social Anxiety:  No  Psychotic Symptoms:  Hallucinations: No None Delusions:  No Paranoia:  No   Ideas of Reference:  No  PTSD Symptoms: Ever had a traumatic exposure:  No Had a traumatic exposure in the last month:  No Re-experiencing: No   Traumatic Brain Injury: No  Past Psychiatric History: Diagnosis: MDD  Hospitalizations: None  Outpatient Care: Miranda Delton PA  Substance  Abuse Care: denies  Self-Mutilation: denies  Suicidal Attempts: denies  Violent Behaviors: denies   Past Medical History:   Past Medical History  Diagnosis Date  . DDD (degenerative disc disease), cervical   . GERD (gastroesophageal reflux disease)   . Migraines 04/02/12    "some"  . Chronic lower back pain   . Recurrent UTI   . Paroxysmal atrial fibrillation   . Atrial flutter   . Kidney stones   . Paroxysmal atrial fibrillation    History of Loss of Consciousness:  No Seizure History:  No Cardiac History:  No Allergies:  No Known Allergies Current Medications:  Current Outpatient Prescriptions  Medication Sig Dispense Refill  . desvenlafaxine (PRISTIQ) 50 MG 24 hr tablet Take 100 mg by mouth daily.       . flecainide (TAMBOCOR) 50 MG tablet Take 1 tablet (50 mg total) by mouth 2 (two) times daily.  180 tablet  3  . hydrochlorothiazide (MICROZIDE) 12.5 MG capsule 12.5 mg as needed.      Marland Kitchen LORazepam (ATIVAN) 0.5 MG tablet Take 0.5 mg by mouth at bedtime as needed. For sleep      . methocarbamol (ROBAXIN) 500 MG tablet 500 mg as needed.      . metoprolol tartrate (LOPRESSOR) 25 MG tablet Take one by mouth twice daily  60 tablet  6  . Multiple Vitamin (MULITIVITAMIN WITH MINERALS) TABS Take 1 tablet by mouth daily.      Marland Kitchen NEXIUM 40 MG capsule as needed.      . ondansetron (ZOFRAN ODT) 4 MG disintegrating tablet Take 1 tablet (4 mg total) by mouth every 8 (eight) hours  as needed for nausea.  12 tablet  0  . oxyCODONE (ROXICODONE) 15 MG immediate release tablet Take 15 mg by mouth every 4 (four) hours as needed. For pain      . oxyCODONE-acetaminophen (PERCOCET/ROXICET) 5-325 MG per tablet Take 1 tablet by mouth every 4 (four) hours as needed for pain.  20 tablet  0  . RESTASIS 0.05 % ophthalmic emulsion as needed.      . rizatriptan (MAXALT-MLT) 10 MG disintegrating tablet 10 mg as needed.        No current facility-administered medications for this visit.    Previous  Psychotropic Medications:  Medication Dose   effexor                       Substance /abuse History: patient denies any  Social History: Current Place of Residence: [place] of Birth: [place]  Family Members: husband Marital Status:  Married Children: 4  Sons: 2  Daughters: 2 Education:  Management consultant Problems/Performance: None Religious Beliefs/Practices: Christian History of Abuse: none Teacher, music History:  None. Legal History: none Hobbies/Interests: beach, soft ball Family History:   Family History  Problem Relation Age of Onset  . Hypertension      Mental Status Examination/Evaluation: Objective:  Appearance: Fairly Groomed  Patent attorney::  Fair  Speech:  Clear and Coherent  Volume:  Normal  Mood:  dysthymic  Affect:  Depressed  Thought Process:  Circumstantial  Orientation:  Full (Time, Place, and Person)  Thought Content:  WDL  Suicidal Thoughts:  No  Homicidal Thoughts:  No  Judgement:  Fair  Insight:  Fair  Psychomotor Activity:  Normal  Akathisia:  No  Handed:  Right  AIMS (if indicated):  na  Assets:  Communication Skills Desire for Improvement Financial Resources/Insurance Housing Physical Health Social Support    Laboratory/X-Ray Psychological Evaluation(s)   had physical within past month, no anemia or thyroid problems     Assessment:    AXIS I Depressive Disorder NOS  AXIS II Deferred  AXIS III Past Medical History  Diagnosis Date  . DDD (degenerative disc disease), cervical   . GERD (gastroesophageal reflux disease)   . Migraines 04/02/12    "some"  . Chronic lower back pain   . Recurrent UTI   . Paroxysmal atrial fibrillation   . Atrial flutter   . Kidney stones   . Paroxysmal atrial fibrillation      AXIS IV other psychosocial or environmental problems  AXIS V 61-70 mild symptoms   Treatment Plan/Recommendations:  Plan of Care: medication management  Laboratory:  None  currently  Psychotherapy: none currently  Medications: Increase Pristiq to 150mg   Routine PRN Medications:  Yes  Consultations: none currently  Safety Concerns:  None currently  Other:      Patrick North, MD 11/21/20149:15 AM

## 2013-10-25 ENCOUNTER — Ambulatory Visit (INDEPENDENT_AMBULATORY_CARE_PROVIDER_SITE_OTHER): Payer: Medicare Other | Admitting: Psychiatry

## 2013-10-25 VITALS — BP 108/68 | HR 48 | Ht 67.0 in | Wt 179.0 lb

## 2013-10-25 DIAGNOSIS — F32A Depression, unspecified: Secondary | ICD-10-CM

## 2013-10-25 DIAGNOSIS — F329 Major depressive disorder, single episode, unspecified: Secondary | ICD-10-CM

## 2013-10-25 DIAGNOSIS — F331 Major depressive disorder, recurrent, moderate: Secondary | ICD-10-CM

## 2013-10-25 MED ORDER — QUETIAPINE FUMARATE 25 MG PO TABS: 25.0000 mg | ORAL_TABLET | Freq: Two times a day (BID) | ORAL | Status: DC

## 2013-10-25 MED ORDER — DESVENLAFAXINE SUCCINATE ER 50 MG PO TB24: ORAL_TABLET | ORAL | Status: DC

## 2013-10-25 NOTE — Progress Notes (Signed)
[facility] [id number] Progress Note  Miranda Taylor [id number] 52 y.o.  10/25/2013 9:35 AM  Chief Complaint: "increased irritability on higher dose of pristiq"  History of Present Illness:  Patient is a 52 year old woman with a long history of major depressive disorder seen for a followup today. Patient`s Pristiq was increased to 150 mg at her previous visit to treat depressed mood and anhedonia. Patient today reports that depressed he made her feel more irritable and she had become very snappy . She reports she took it for 2 weeks and then decreased back to 100 mg. Continues to feel depressed and then not enjoying her everyday activities. She is able to function but not the way she can actually enjoy her life. Reports she sleeps well on some days and has trouble sleeping on other days. Eating well, not able to exercise.  Increased anxiety, reports she worries a lot about her children. Has a supportive family, denies any suicidal thoughts.   Suicidal Ideation: No Plan Formed: No Patient has means to carry out plan: No  Homicidal Ideation: No Plan Formed: No Patient has means to carry out plan: No  Review of Systems: Psychiatric: Agitation: Yes Hallucination: No Depressed Mood: Yes Insomnia: No Hypersomnia: No Altered Concentration: No Feels Worthless: No Grandiose Ideas: No Belief In Special Powers: No New/Increased Substance Abuse: No Compulsions: No  Neurologic: Headache: No Seizure: No Paresthesias: No  Past Medical Family, Social History: Patient has h/o Lower back surgery. History of atrial fibrillation, stable..  Outpatient Encounter Prescriptions as of 10/25/2013  Medication Sig  . desvenlafaxine (PRISTIQ) 50 MG 24 hr tablet Take 3 tablets (150 mg total) by mouth daily.  . flecainide (TAMBOCOR) 50 MG tablet Take 1 tablet (50 mg total) by mouth 2 (two) times daily.  . hydrochlorothiazide (MICROZIDE) 12.5 MG capsule 12.5 mg as needed.  Marland Kitchen LORazepam (ATIVAN)  0.5 MG tablet Take 0.5 mg by mouth at bedtime as needed. For sleep  . methocarbamol (ROBAXIN) 500 MG tablet 500 mg as needed.  . metoprolol tartrate (LOPRESSOR) 25 MG tablet Take one by mouth twice daily  . Multiple Vitamin (MULITIVITAMIN WITH MINERALS) TABS Take 1 tablet by mouth daily.  Marland Kitchen NEXIUM 40 MG capsule as needed.  . ondansetron (ZOFRAN ODT) 4 MG disintegrating tablet Take 1 tablet (4 mg total) by mouth every 8 (eight) hours as needed for nausea.  Marland Kitchen oxyCODONE (ROXICODONE) 15 MG immediate release tablet Take 15 mg by mouth every 4 (four) hours as needed. For pain  . oxyCODONE-acetaminophen (PERCOCET/ROXICET) 5-325 MG per tablet Take 1 tablet by mouth every 4 (four) hours as needed for pain.  Marland Kitchen RESTASIS 0.05 % ophthalmic emulsion as needed.  . rizatriptan (MAXALT-MLT) 10 MG disintegrating tablet 10 mg as needed.     Past Psychiatric History/Hospitalization(s): Anxiety: No Bipolar Disorder: No Depression: Yes Mania: No Psychosis: No Schizophrenia: No Personality Disorder: No Hospitalization for psychiatric illness: No History of Electroconvulsive Shock Therapy: No Prior Suicide Attempts: No  Physical Exam: Constitutional:  There were no vitals taken for this visit.  General Appearance: alert, oriented, no acute distress  Musculoskeletal: Strength & Muscle Tone: within normal limits Gait & Station: normal Patient leans: N/A  Psychiatric: Speech (describe rate, volume, coherence, spontaneity, and abnormalities if any): normal rate  Thought Process (describe rate, content, abstract reasoning, and computation): normal  Associations: Coherent  Thoughts: normal  Mental Status: Orientation: oriented to person, place, time/date and situation Mood & Affect: flat affect Attention Span & Concentration: normal  Medical  Decision Making (Choose Three): Established Problem, Stable/Improving (1), Review of Psycho-Social Stressors (1), Review of Medication Regimen & Side Effects  (2) and Review of New Medication or Change in Dosage (2)  Assessment: Axis I: MDD  Axis II: deferred  Axis III: Atrial fibrillation  Axis IV: family stressors  Axis V: 70   Plan: Start Seroquel 25 mg po bid, metabolic side effects and movement disorder risks discussed. Patient provides verbal consent. Decrease Pristiq to 100 mg. Patient counseled on strategies to decrease stress and to focus on some cognitive strategies to decrease anxiety. Return to clinic in 2 weeks or call before if needed.  Patrick North, MD 10/25/2013

## 2013-10-30 ENCOUNTER — Other Ambulatory Visit (HOSPITAL_COMMUNITY): Payer: Self-pay | Admitting: *Deleted

## 2013-10-30 DIAGNOSIS — F329 Major depressive disorder, single episode, unspecified: Secondary | ICD-10-CM

## 2013-10-30 DIAGNOSIS — F32A Depression, unspecified: Secondary | ICD-10-CM

## 2013-10-30 MED ORDER — DESVENLAFAXINE SUCCINATE ER 50 MG PO TB24: ORAL_TABLET | ORAL | Status: DC

## 2013-10-30 NOTE — Telephone Encounter (Signed)
RX for Pristiq printed in error. Sent by escript at this time

## 2013-11-11 ENCOUNTER — Other Ambulatory Visit: Payer: Self-pay | Admitting: *Deleted

## 2013-11-11 DIAGNOSIS — F331 Major depressive disorder, recurrent, moderate: Secondary | ICD-10-CM | POA: Insufficient documentation

## 2013-11-11 MED ORDER — METOPROLOL TARTRATE 25 MG PO TABS: ORAL_TABLET | ORAL | Status: DC

## 2013-11-29 ENCOUNTER — Ambulatory Visit (HOSPITAL_COMMUNITY): Payer: Self-pay | Admitting: Psychiatry

## 2013-12-04 ENCOUNTER — Ambulatory Visit (INDEPENDENT_AMBULATORY_CARE_PROVIDER_SITE_OTHER): Payer: Medicare Other | Admitting: Psychiatry

## 2013-12-04 VITALS — BP 112/75 | HR 50 | Ht 67.0 in | Wt 178.0 lb

## 2013-12-04 DIAGNOSIS — F329 Major depressive disorder, single episode, unspecified: Secondary | ICD-10-CM

## 2013-12-04 DIAGNOSIS — F32A Depression, unspecified: Secondary | ICD-10-CM

## 2013-12-04 DIAGNOSIS — F3289 Other specified depressive episodes: Secondary | ICD-10-CM

## 2013-12-04 MED ORDER — DESVENLAFAXINE SUCCINATE ER 100 MG PO TB24: ORAL_TABLET | ORAL | Status: DC

## 2013-12-04 MED ORDER — QUETIAPINE FUMARATE 25 MG PO TABS: 25.0000 mg | ORAL_TABLET | Freq: Every day | ORAL | Status: DC

## 2013-12-04 NOTE — Progress Notes (Signed)
Patient ID: Miranda Taylor, female   DOB: [birth year], 53 y.o.   MRN: [id number]  [facility] [address] Progress Note  Miranda Taylor [id number] 53 y.o.  12/04/2013 10:41 AM  Chief Complaint: "became very sedated on the seroquel"  History of Present Illness:  Patient is a 53 year old woman with a long history of major depressive disorder seen for a followup today. Patient was started on sequela 25 mg twice daily to help with mood regulation  At her previous visit. Patient reports that she became very sedated on the Seroquel and stopped taking it . She increased the Pristiq back to 150 mg. she states her mood has improved a little compared to previously . She reports feeling wound up all the time and feeling like she has to do something. Since she stopped working full time she has not been able to implement a structure to her daily routine. Reports she sleeps well on some days and has trouble sleeping on other days. Eating well, not able to exercise.  Increased anxiety, reports she worries a lot about her children. Has a supportive family, denies any suicidal thoughts.   Suicidal Ideation: No Plan Formed: No Patient has means to carry out plan: No  Homicidal Ideation: No Plan Formed: No Patient has means to carry out plan: No  Review of Systems: Psychiatric: Agitation: Yes Hallucination: No Depressed Mood: Yes Insomnia: No Hypersomnia: No Altered Concentration: No Feels Worthless: No Grandiose Ideas: No Belief In Special Powers: No New/Increased Substance Abuse: No Compulsions: No  Neurologic: Headache: No Seizure: No Paresthesias: No  Past Medical Family, Social History: Patient has h/o Lower back surgery. History of atrial fibrillation, stable..  Outpatient Encounter Prescriptions as of 12/04/2013  Medication Sig  . desvenlafaxine (PRISTIQ) 50 MG 24 hr tablet Take 2 tablets by mouth daily  . flecainide (TAMBOCOR) 50 MG tablet Take 1 tablet (50 mg total) by mouth 2  (two) times daily.  . hydrochlorothiazide (MICROZIDE) 12.5 MG capsule 12.5 mg as needed.  Marland Kitchen LORazepam (ATIVAN) 0.5 MG tablet Take 0.5 mg by mouth at bedtime as needed. For sleep  . methocarbamol (ROBAXIN) 500 MG tablet 500 mg as needed.  . metoprolol tartrate (LOPRESSOR) 25 MG tablet Take one by mouth twice daily  . Multiple Vitamin (MULITIVITAMIN WITH MINERALS) TABS Take 1 tablet by mouth daily.  Marland Kitchen NEXIUM 40 MG capsule as needed.  . ondansetron (ZOFRAN ODT) 4 MG disintegrating tablet Take 1 tablet (4 mg total) by mouth every 8 (eight) hours as needed for nausea.  Marland Kitchen oxyCODONE (ROXICODONE) 15 MG immediate release tablet Take 15 mg by mouth every 4 (four) hours as needed. For pain  . oxyCODONE-acetaminophen (PERCOCET/ROXICET) 5-325 MG per tablet Take 1 tablet by mouth every 4 (four) hours as needed for pain.  Marland Kitchen QUEtiapine (SEROQUEL) 25 MG tablet Take 1 tablet (25 mg total) by mouth 2 (two) times daily.  . RESTASIS 0.05 % ophthalmic emulsion as needed.  . rizatriptan (MAXALT-MLT) 10 MG disintegrating tablet 10 mg as needed.     Past Psychiatric History/Hospitalization(s): Anxiety: No Bipolar Disorder: No Depression: Yes Mania: No Psychosis: No Schizophrenia: No Personality Disorder: No Hospitalization for psychiatric illness: No History of Electroconvulsive Shock Therapy: No Prior Suicide Attempts: No  Physical Exam: Constitutional:  BP 112/75  Pulse 50  Ht 5\' 7"  (1.702 m)  Wt 178 lb (80.74 kg)  BMI 27.87 kg/m2  General Appearance: alert, oriented, no acute distress  Musculoskeletal: Strength & Muscle Tone: within normal limits Gait & Station:  normal Patient leans: N/A  Psychiatric: Speech (describe rate, volume, coherence, spontaneity, and abnormalities if any): normal rate  Thought Process (describe rate, content, abstract reasoning, and computation): normal  Associations: Coherent  Thoughts: normal  Mental Status: Orientation: oriented to person, place, time/date  and situation Mood & Affect: flat affect Attention Span & Concentration: normal  Medical Decision Making (Choose Three): Established Problem, Stable/Improving (1), Review of Psycho-Social Stressors (1), Review of Medication Regimen & Side Effects (2) and Review of New Medication or Change in Dosage (2)  Assessment: Axis I: MDD  Axis II: deferred  Axis III: Atrial fibrillation  Axis IV: family stressors  Axis V: 62   Plan: Depression: Take Seroquel at 25mg  at bedtime. Increase Pristiq to 150 mg. Stop the Ativan at 0.5 mg at bedtime Patient counseled on strategies to stay active at home and exercise regularly. Recommend She start to see a therapist to have a more structured lifestyle, this would help with some of her mood and anxiety symptoms. Return to clinic in 3 months or call before if needed.  Elvin So, MD 12/04/2013

## 2013-12-05 ENCOUNTER — Telehealth (HOSPITAL_COMMUNITY): Payer: Self-pay | Admitting: *Deleted

## 2013-12-05 NOTE — Telephone Encounter (Signed)
Received fax from [employer]:"Per manufacturer, Pristiq cannot be split,crushed,or chewed as this may alter the rate of drug release".Requesting provider make decision to continue or change therapy and fax response or call.

## 2013-12-30 IMAGING — CT CT ANGIO CHEST
2 of 6 series · 19 of 46 positions shown · IV contrast (APPLIED)
Comparison: None.

CLINICAL DATA: Back pain

CT ANGIOGRAPHY CHEST
TECHNIQUE: Multidetector CT imaging of the chest using the
standard protocol during bolus administration of intravenous
contrast. Multiplanar reconstructed images including MIPs were
obtained and reviewed to evaluate the vascular anatomy.
Contrast: 100mL OMNIPAQUE IOHEXOL 350 MG/ML SOLN

[Series 6: pulm embolism 1.0 b25f thin · axial · 0.63mm/px · z∈[-314,-40]mm · 16 of 301 slices shown]
[im 14/301  lung]
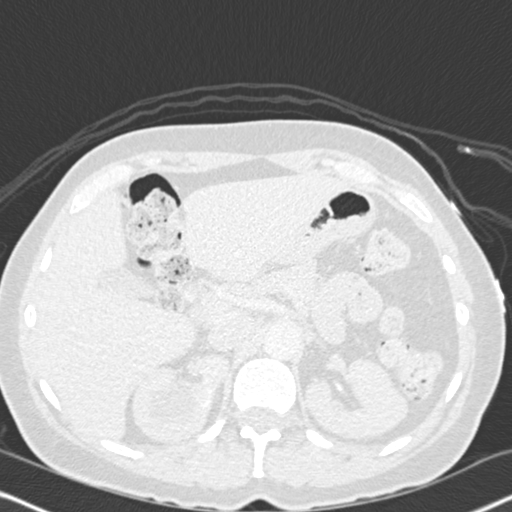
[im 40/301  soft-tissue]
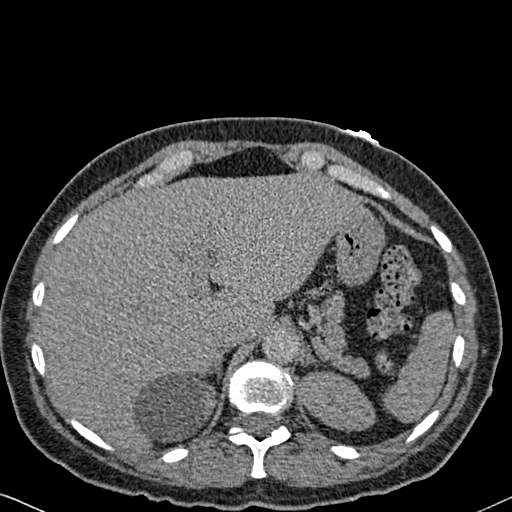
[im 53/301  lung]
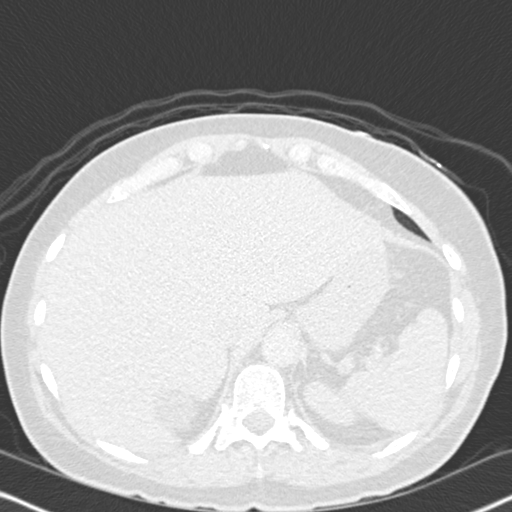
[im 66/301  soft-tissue]
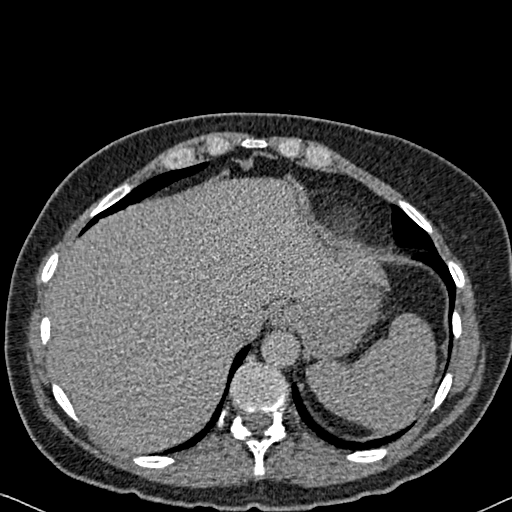
[im 92/301  lung]
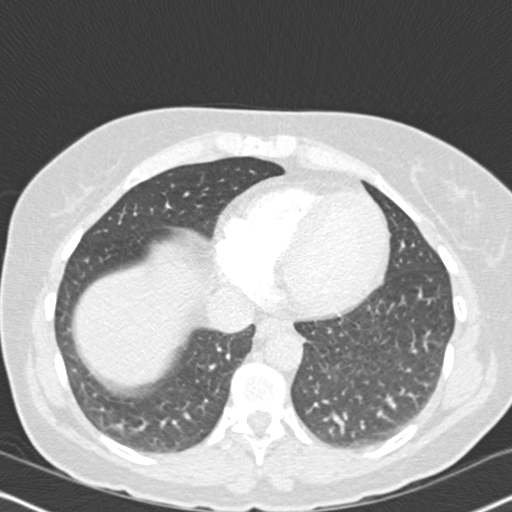
[im 105/301  soft-tissue]
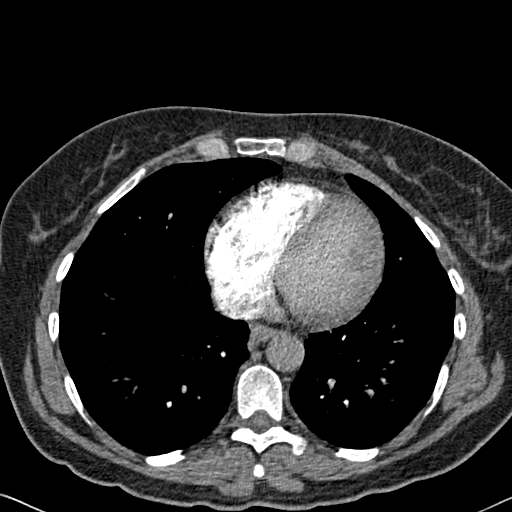
[im 118/301  lung]
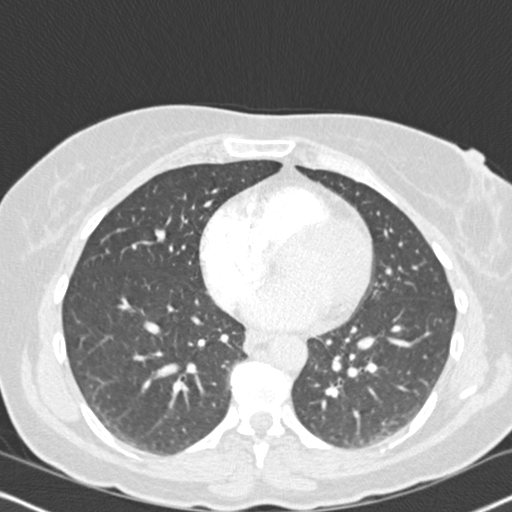
[im 144/301  soft-tissue]
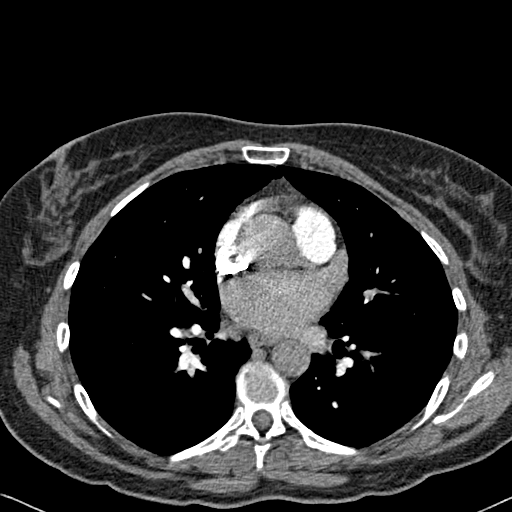
[im 157/301  lung]
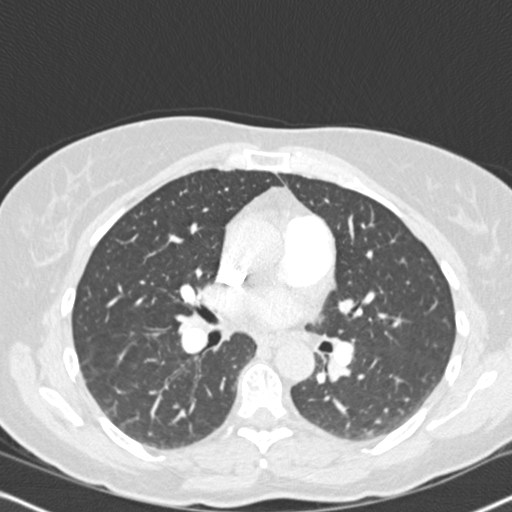
[im 183/301  soft-tissue]
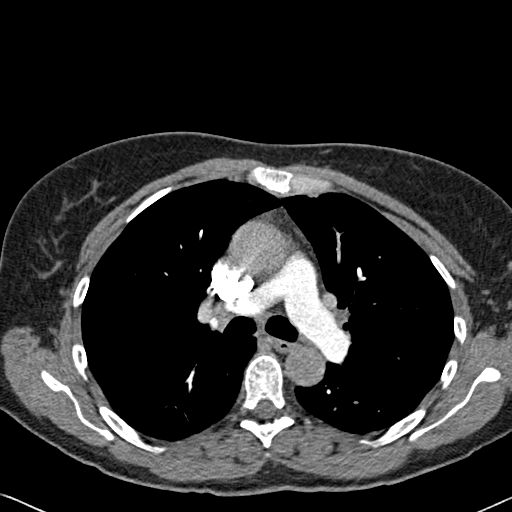
[im 196/301  lung]
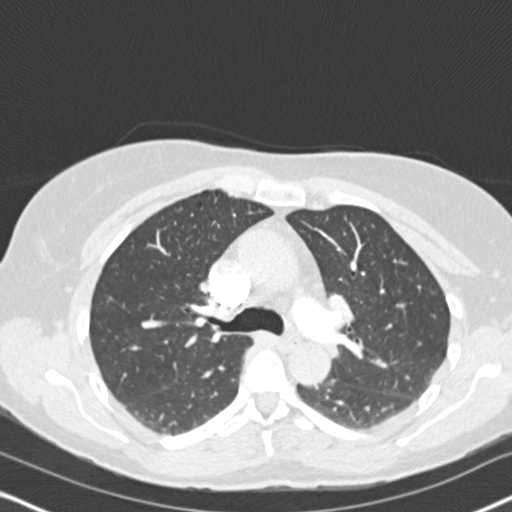
[im 209/301  soft-tissue]
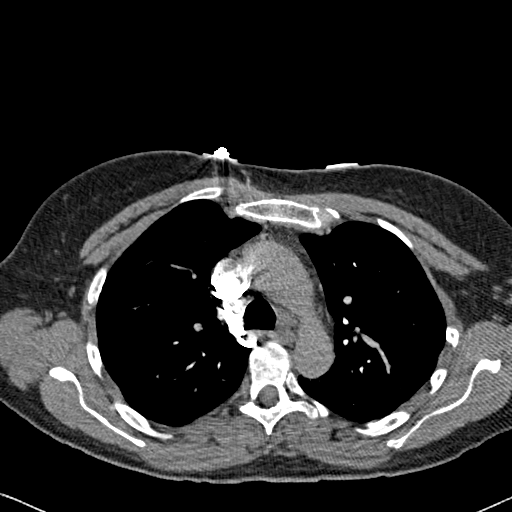
[im 235/301  lung]
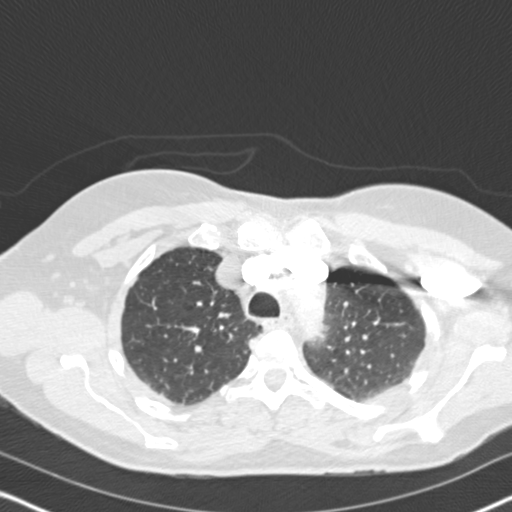
[im 248/301  soft-tissue]
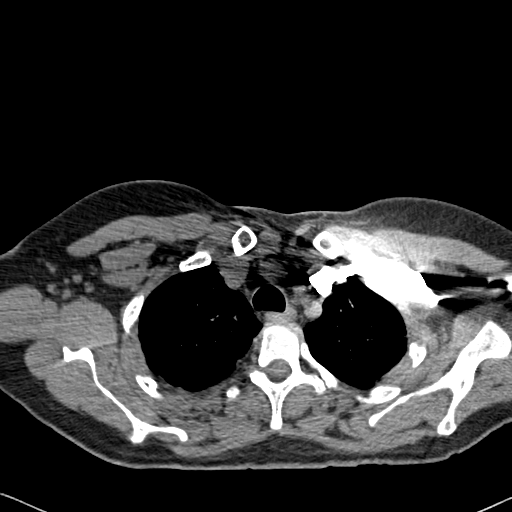
[im 261/301  lung]
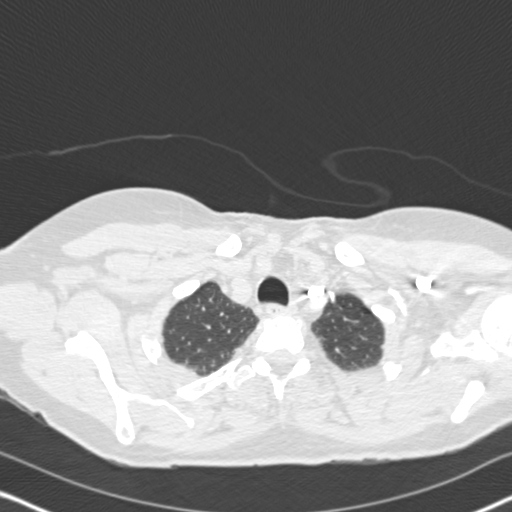
[im 287/301  soft-tissue]
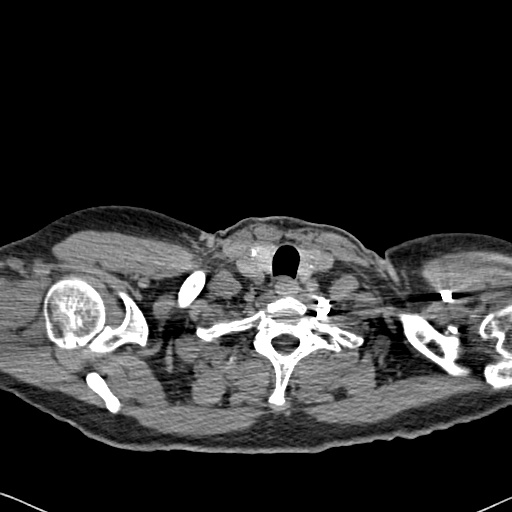

[Series 8: coronals · coronal · 0.70mm/px · 3 of 108 slices shown]
[im 27/108  soft-tissue]
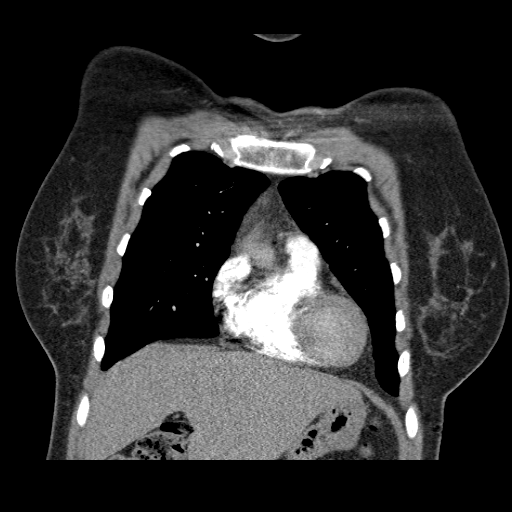
[im 54/108  soft-tissue]
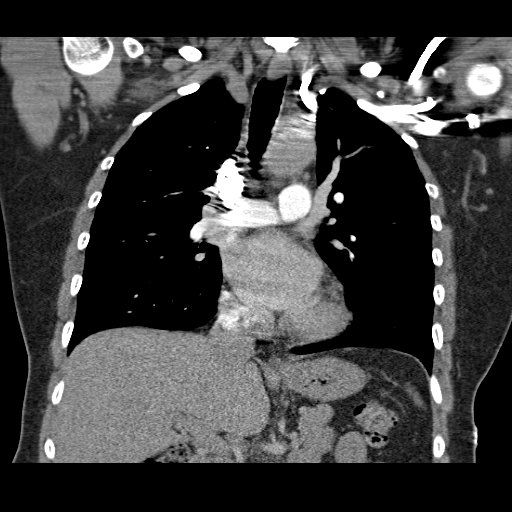
[im 81/108  soft-tissue]
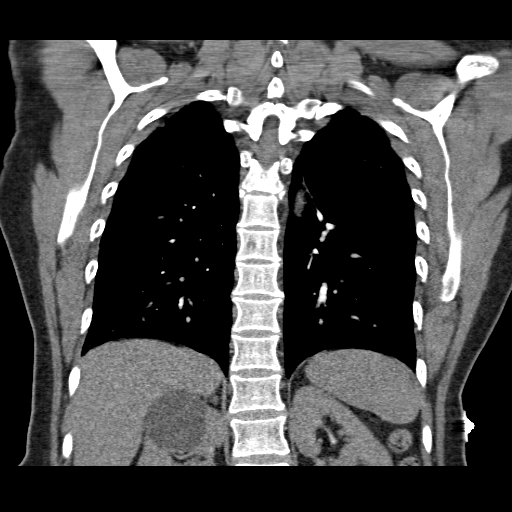

[19 of 46 positions shown; findings below may reference images not displayed]

FINDINGS: Respiratory motion artifact limits the examination.  No
obvious filling defects in the pulmonary arterial tree to suggest
acute pulmonary thromboembolism.

Negative abnormal mediastinal adenopathy. Small mediastinal and
hilar nodes are present.

Calcified thyroid lesions bilaterally.

No pneumothorax.  No pleural fluid.

Large simple cyst in the right kidney.
IMPRESSION: No evidence of acute pulmonary thromboembolism.

Calcified thyroid masses.  Thyroid ultrasound is recommended.

## 2013-12-30 IMAGING — CR DG CHEST 2V
2 series · 2 of 2 positions shown · non-contrast
Comparison: None.

CLINICAL DATA: New onset of atrial fibrillation.

CHEST - 2 VIEW

[w chest pa]
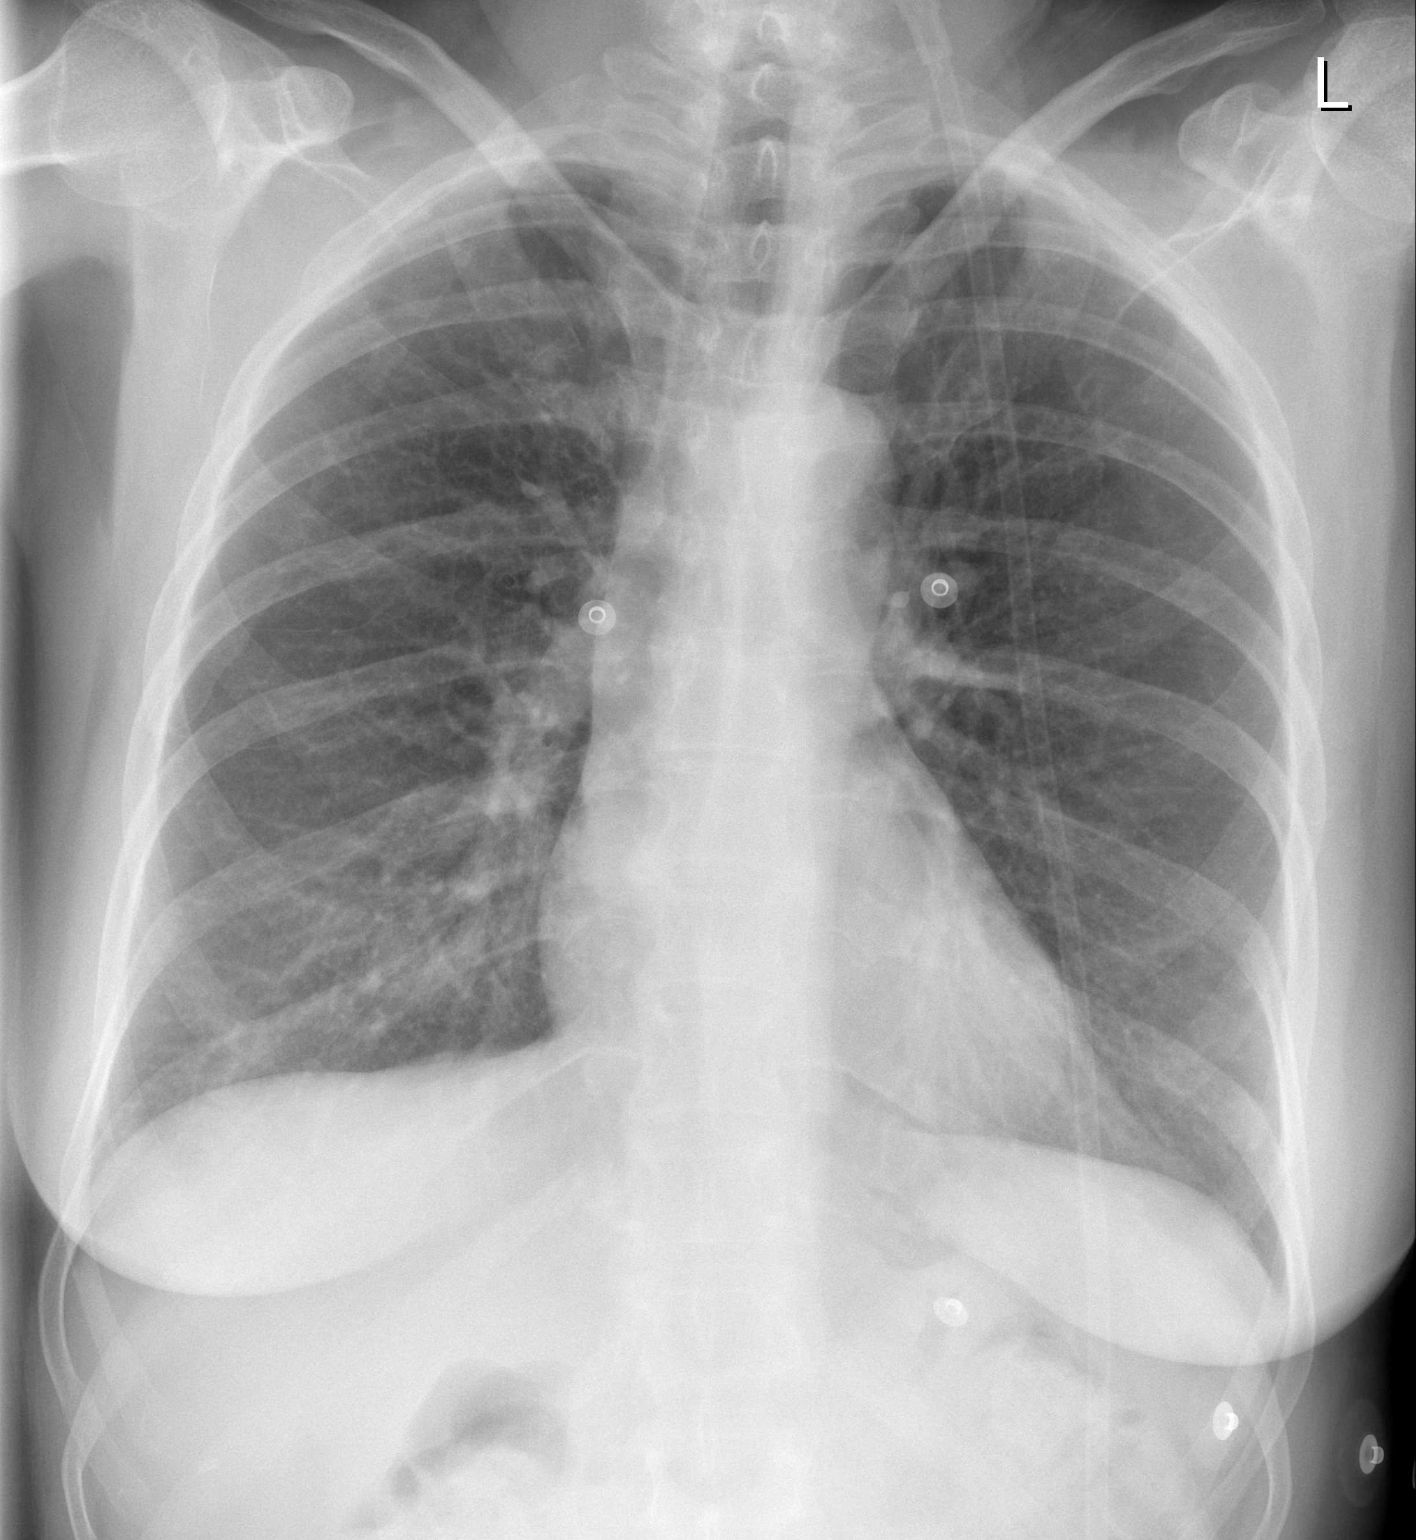

[w chest lat]
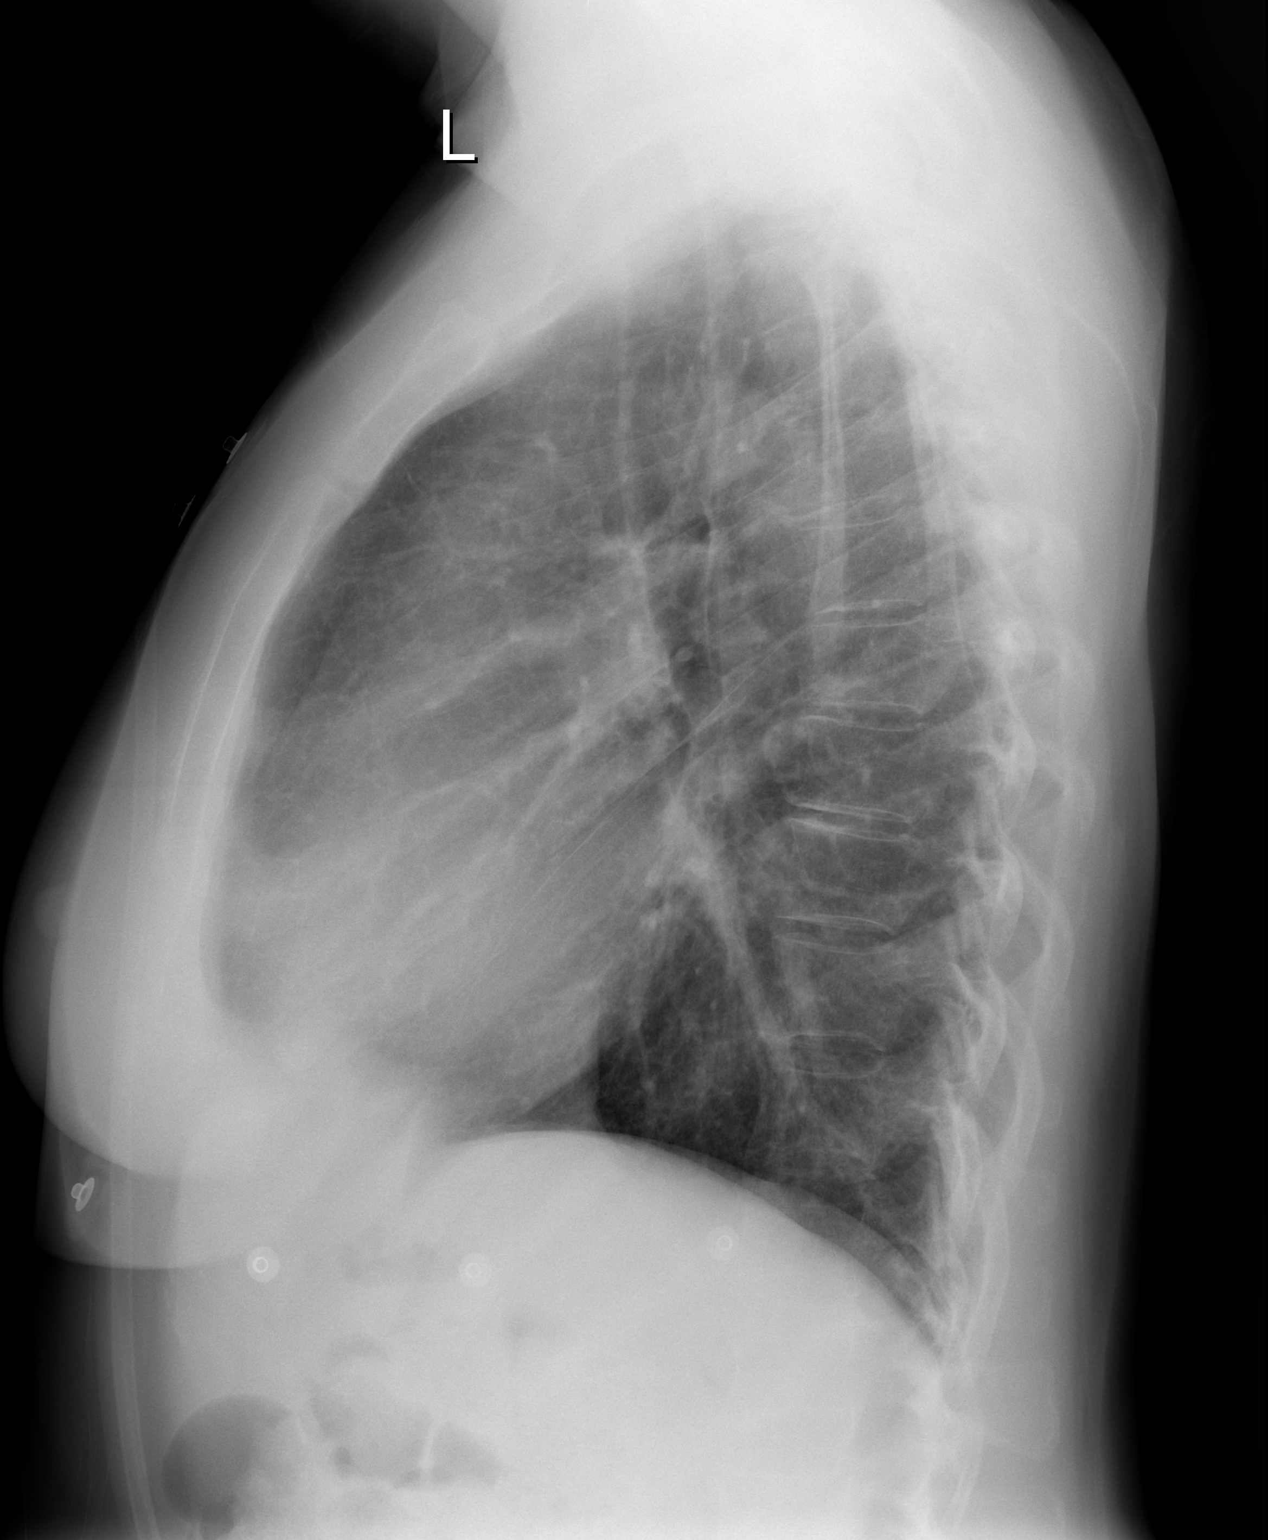

[2 of 2 positions shown; findings below may reference images not displayed]

FINDINGS: The heart size and pulmonary vascularity are normal and
the lungs are clear.  No effusions.  No osseous abnormality.
IMPRESSION: Normal chest.

## 2014-03-04 ENCOUNTER — Ambulatory Visit (INDEPENDENT_AMBULATORY_CARE_PROVIDER_SITE_OTHER): Payer: Medicare Other | Admitting: Psychiatry

## 2014-03-04 VITALS — BP 126/65 | HR 51 | Ht 67.0 in | Wt 181.8 lb

## 2014-03-04 DIAGNOSIS — F329 Major depressive disorder, single episode, unspecified: Secondary | ICD-10-CM | POA: Diagnosis not present

## 2014-03-04 DIAGNOSIS — F32A Depression, unspecified: Secondary | ICD-10-CM

## 2014-03-04 MED ORDER — DESVENLAFAXINE SUCCINATE ER 100 MG PO TB24: ORAL_TABLET | ORAL | Status: DC

## 2014-03-04 NOTE — Progress Notes (Signed)
Patient ID: MANDI MATTIOLI, female   DOB: Sep 12, 1961, 53 y.o.   MRN: [id number]    [facility] [id number] Progress Note  ARTINA MINELLA [id number] 53 y.o.  03/04/2014 9:58 AM  Chief Complaint: "depressed mood, anxiety"  History of Present Illness:  Patient is a 53 year old woman with a long history of major depressive disorder seen for a followup today.  At her previous visit, patient was started on Seroquel 25mg  at bedtime. States it made her very tired and she stopped it. Continuing the Pristiq at 150 mg. she states her mood has gone down. Small things bother her a lot. She reports feeling wound up all the time and feeling like she has to do something.  Reports she sleeps well on some days and has trouble sleeping on other days. Eating well, not able to exercise.  Increased anxiety, reports she worries a lot about her children. Has a supportive family, denies any suicidal thoughts.   Suicidal Ideation: No Plan Formed: No Patient has means to carry out plan: No  Homicidal Ideation: No Plan Formed: No Patient has means to carry out plan: No  Review of Systems: Psychiatric: Agitation: Yes Hallucination: No Depressed Mood: Yes Insomnia: No Hypersomnia: No Altered Concentration: No Feels Worthless: No Grandiose Ideas: No Belief In Special Powers: No New/Increased Substance Abuse: No Compulsions: No  Neurologic: Headache: No Seizure: No Paresthesias: No  Past Medical Family, Social History: Patient has h/o Lower back surgery. History of atrial fibrillation, stable..  Outpatient Encounter Prescriptions as of 03/04/2014  Medication Sig  . desvenlafaxine (PRISTIQ) 100 MG 24 hr tablet Take 1 and 1/2 tablets by mouth daily  . flecainide (TAMBOCOR) 50 MG tablet Take 1 tablet (50 mg total) by mouth 2 (two) times daily.  . hydrochlorothiazide (MICROZIDE) 12.5 MG capsule 12.5 mg as needed.  Marland Kitchen LORazepam (ATIVAN) 0.5 MG tablet Take 0.5 mg by mouth at bedtime as needed. For  sleep  . methocarbamol (ROBAXIN) 500 MG tablet 500 mg as needed.  . metoprolol tartrate (LOPRESSOR) 25 MG tablet Take one by mouth twice daily  . Multiple Vitamin (MULITIVITAMIN WITH MINERALS) TABS Take 1 tablet by mouth daily.  Marland Kitchen NEXIUM 40 MG capsule as needed.  . ondansetron (ZOFRAN ODT) 4 MG disintegrating tablet Take 1 tablet (4 mg total) by mouth every 8 (eight) hours as needed for nausea.  Marland Kitchen oxyCODONE (ROXICODONE) 15 MG immediate release tablet Take 15 mg by mouth every 4 (four) hours as needed. For pain  . oxyCODONE-acetaminophen (PERCOCET/ROXICET) 5-325 MG per tablet Take 1 tablet by mouth every 4 (four) hours as needed for pain.  Marland Kitchen QUEtiapine (SEROQUEL) 25 MG tablet Take 1 tablet (25 mg total) by mouth at bedtime.  . RESTASIS 0.05 % ophthalmic emulsion as needed.  . rizatriptan (MAXALT-MLT) 10 MG disintegrating tablet 10 mg as needed.     Past Psychiatric History/Hospitalization(s): Anxiety: No Bipolar Disorder: No Depression: Yes Mania: No Psychosis: No Schizophrenia: No Personality Disorder: No Hospitalization for psychiatric illness: No History of Electroconvulsive Shock Therapy: No Prior Suicide Attempts: No  Physical Exam: Constitutional:  BP 126/65  Pulse 51  Ht 5\' 7"  (1.702 m)  Wt 181 lb 12.8 oz (82.464 kg)  BMI 28.47 kg/m2  General Appearance: alert, oriented, no acute distress  Musculoskeletal: Strength & Muscle Tone: within normal limits Gait & Station: normal Patient leans: N/A  Psychiatric: Speech (describe rate, volume, coherence, spontaneity, and abnormalities if any): normal rate  Thought Process (describe rate, content, abstract reasoning, and computation): normal  Associations: Coherent  Thoughts: normal  Mental Status: Orientation: oriented to person, place, time/date and situation Mood & Affect: flat affect Attention Span & Concentration: normal  Medical Decision Making (Choose Three): Established Problem, Stable/Improving (1), Review  of Psycho-Social Stressors (1), Review of Medication Regimen & Side Effects (2) and Review of New Medication or Change in Dosage (2)  Assessment: Axis I: MDD  Axis II: deferred  Axis III: Atrial fibrillation  Axis IV: family stressors  Axis V: 91   Plan: Depression: Discontinue Seroquel. Continue Pristiq to 150 mg. Patient counseled on strategies to stay active at home and exercise regularly. Recommend She start to see a therapist to have a more structured lifestyle, this would help with some of her mood and anxiety symptoms. Return to clinic in 3 months or call before if needed. Patient informed she will start seeing a different provider next visit. Elvin So, MD 03/04/2014

## 2014-03-11 ENCOUNTER — Other Ambulatory Visit (HOSPITAL_COMMUNITY): Payer: Self-pay | Admitting: Psychiatry

## 2014-03-11 DIAGNOSIS — F329 Major depressive disorder, single episode, unspecified: Secondary | ICD-10-CM

## 2014-03-11 DIAGNOSIS — F32A Depression, unspecified: Secondary | ICD-10-CM

## 2014-03-11 MED ORDER — DESVENLAFAXINE SUCCINATE ER 50 MG PO TB24: 50.0000 mg | ORAL_TABLET | Freq: Every day | ORAL | Status: DC

## 2014-03-11 MED ORDER — DESVENLAFAXINE SUCCINATE ER 100 MG PO TB24: ORAL_TABLET | ORAL | Status: DC

## 2014-03-12 ENCOUNTER — Encounter: Payer: Self-pay | Admitting: Cardiovascular Disease

## 2014-03-12 ENCOUNTER — Ambulatory Visit (INDEPENDENT_AMBULATORY_CARE_PROVIDER_SITE_OTHER): Payer: Medicare Other | Admitting: Cardiovascular Disease

## 2014-03-12 VITALS — BP 94/68 | HR 55 | Ht 68.0 in | Wt 186.0 lb

## 2014-03-12 DIAGNOSIS — I4891 Unspecified atrial fibrillation: Secondary | ICD-10-CM | POA: Diagnosis not present

## 2014-03-12 MED ORDER — METOPROLOL TARTRATE 50 MG PO TABS: 50.0000 mg | ORAL_TABLET | Freq: Two times a day (BID) | ORAL | Status: DC

## 2014-03-12 NOTE — Patient Instructions (Signed)
We request that you follow-up in: 6 months with an extender and in 1 year with Dr Andria Rhein will receive a reminder letter in the mail two months in advance. If you don't receive a letter, please call our office to schedule the follow-up appointment.  Restart Flecainide at twice a day.  You may take an additional one to two tablets daily as needed for palpitations.

## 2014-03-12 NOTE — Assessment & Plan Note (Signed)
A: intermittent atrial fibrillation likely due to stress and not taking flecainide as prescribed, since she is only taking it PRN P: restart flecainide 50 mg PO BID plus one to two additional doses PRN; continue metoprolol 50 mg PO BID

## 2014-03-12 NOTE — Progress Notes (Signed)
Patient ID: Miranda Taylor, female   DOB: Jul 11, 1961, 53 y.o.   MRN: [id number] 03/12/2014 Miranda Taylor   [birth year]  [id number]  Primary Physician Bing Matter, PA-C Primary Cardiologist:Jonathan Adora Fridge MD Lupe Carney, FSCAI   HPI:  The patient is a delightful 53 year old mildly overweight married Caucasian female, mother of 50 and grandmother to 2 grandchildren, whom I last saw in the office 12 months ago. She has a history of symptomatic paroxysmal atrial fibrillation with a low [place] score on beta-blocker and aspirin. She had a normal 2D echo in 2013 and stress test. I did an event monitor that showed multiple episodes of PAF, which she is symptomatic from with complaints of shortness of breath and fatigue. She was referred to the electrophysiology cardiologist who placed her on flecaininde 50 mg PO BID. She was last seen by Dr. Lovena Le in November 2014 and no changes were made to her regimen at that time. She notes that since that time she has been having episodes to atrial fibrillation approximately once weekly. This is usually precipitated by stress or caffeine use. She is NOT taking flecainide twice daily, but rather only as needed if she has symptomatic atrial fibrillation. She is still taking her metoprolol bid as prescribed. She notes increased fatigue with exertion, but denies shortness of breath or chest pain.      Current Outpatient Prescriptions  Medication Sig Dispense Refill  . desvenlafaxine (PRISTIQ) 100 MG 24 hr tablet Take 1  tablet by mouth daily  30 tablet  2  . erythromycin with ethanol (THERAMYCIN) 2 % external solution as needed.      . flecainide (TAMBOCOR) 50 MG tablet Take 50 mg by mouth as needed.      . hydrochlorothiazide (MICROZIDE) 12.5 MG capsule 12.5 mg as needed.      Marland Kitchen LORazepam (ATIVAN) 0.5 MG tablet Take 0.5 mg by mouth at bedtime as needed. For sleep      . methocarbamol (ROBAXIN) 500 MG tablet 500 mg as needed.      . metoprolol tartrate (LOPRESSOR)  25 MG tablet Take 50 mg by mouth 2 (two) times daily. Take one by mouth twice daily      . Multiple Vitamin (MULITIVITAMIN WITH MINERALS) TABS Take 1 tablet by mouth daily.      Marland Kitchen NEXIUM 40 MG capsule as needed.      . ondansetron (ZOFRAN ODT) 4 MG disintegrating tablet Take 1 tablet (4 mg total) by mouth every 8 (eight) hours as needed for nausea.  12 tablet  0  . oxyCODONE (ROXICODONE) 15 MG immediate release tablet Take 15 mg by mouth every 4 (four) hours as needed. For pain      . oxyCODONE-acetaminophen (PERCOCET/ROXICET) 5-325 MG per tablet Take 1 tablet by mouth every 4 (four) hours as needed for pain.  20 tablet  0  . RESTASIS 0.05 % ophthalmic emulsion as needed.      . rizatriptan (MAXALT-MLT) 10 MG disintegrating tablet 10 mg as needed.        No current facility-administered medications for this visit.    No Known Allergies  History   Social History  . Marital Status: Married    Spouse Name: N/A    Number of Children: N/A  . Years of Education: N/A   Occupational History  . Disabled    Social History Main Topics  . Smoking status: Former Smoker -- 0.50 packs/day for 20 years    Types: Cigarettes    Quit date: 11/07/2001  .  Smokeless tobacco: Never Used  . Alcohol Use: No  . Drug Use: No  . Sexual Activity: Yes    Birth Control/ Protection: Pill   Other Topics Concern  . Not on file   Social History Narrative   Pt lives with husband in [place].  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     Review of Systems: General: positive for fatigue; negative for chills, fever, night sweats or weight changes.  Cardiovascular: positive for palpitations, negative for chest pain, dyspnea on exertion, edema, orthopnea, paroxysmal nocturnal dyspnea or shortness of breath Dermatological: negative for rash Respiratory: negative for cough or wheezing Urologic: negative for hematuria Abdominal: negative for nausea, vomiting,  diarrhea, bright red blood per rectum, melena, or hematemesis Neurologic: negative for visual changes, syncope, or dizziness All other systems reviewed and are otherwise negative except as noted above.    Blood pressure 94/68, pulse 55, height 5\' 8"  (1.727 m), weight 186 lb (84.369 kg).  General appearance: alert and no distress Neck: no adenopathy, no carotid bruit, no JVD, supple, symmetrical, trachea midline and thyroid not enlarged, symmetric, no tenderness/mass/nodules Lungs: clear to auscultation bilaterally Heart: regular rate and rhythm, S1, S2 normal, no murmur, click, rub or gallop Extremities: extremities normal, atraumatic, no cyanosis or edema  EKG sinus bradycardia at 55 without ST or T wave changes.  ASSESSMENT AND PLAN:   Atrial fibrillation A: intermittent atrial fibrillation likely due to stress and not taking flecainide as prescribed, since she is only taking it PRN P: restart flecainide 50 mg PO BID plus one to two additional doses PRN; continue metoprolol 50 mg PO BID        Talmadge Coventry, [place], [place], [place], [facility] 03/12/2014 9:53 AM  I have seen the patient and agree with Dr. Thea Gist assessment and plan. The patient has paroxysmal atrial fibrillation. She was on twice a day flecainide when I saw her last and alcohol he takes when necessary. Return to twice a day dosing with additional side during episodes of PAF. I will see her back in one year for followup.  Lorretta Harp, M.D., [place], [facility], [place] [address] [place], [place]  [address]  [phone] 03/12/2014 12:18 PM

## 2014-03-20 ENCOUNTER — Ambulatory Visit: Payer: Self-pay | Admitting: Internal Medicine

## 2014-04-30 ENCOUNTER — Ambulatory Visit: Payer: Self-pay | Admitting: Internal Medicine

## 2014-05-06 ENCOUNTER — Ambulatory Visit (HOSPITAL_COMMUNITY): Payer: Self-pay | Admitting: Psychiatry

## 2014-05-15 ENCOUNTER — Ambulatory Visit (HOSPITAL_COMMUNITY): Payer: Self-pay | Admitting: Psychiatry

## 2014-05-31 ENCOUNTER — Emergency Department (HOSPITAL_BASED_OUTPATIENT_CLINIC_OR_DEPARTMENT_OTHER): Payer: Medicare Other

## 2014-05-31 ENCOUNTER — Emergency Department (HOSPITAL_BASED_OUTPATIENT_CLINIC_OR_DEPARTMENT_OTHER)
Admission: EM | Admit: 2014-05-31 | Discharge: 2014-05-31 | Disposition: A | Discharge status: Home / Self Care | Payer: Medicare Other | Attending: Emergency Medicine | Admitting: Emergency Medicine

## 2014-05-31 ENCOUNTER — Encounter (HOSPITAL_BASED_OUTPATIENT_CLINIC_OR_DEPARTMENT_OTHER): Payer: Self-pay | Admitting: Emergency Medicine

## 2014-05-31 DIAGNOSIS — Y9289 Other specified places as the place of occurrence of the external cause: Secondary | ICD-10-CM | POA: Diagnosis not present

## 2014-05-31 DIAGNOSIS — Z79899 Other long term (current) drug therapy: Secondary | ICD-10-CM | POA: Insufficient documentation

## 2014-05-31 DIAGNOSIS — I4891 Unspecified atrial fibrillation: Secondary | ICD-10-CM | POA: Insufficient documentation

## 2014-05-31 DIAGNOSIS — S6990XA Unspecified injury of unspecified wrist, hand and finger(s), initial encounter: Secondary | ICD-10-CM | POA: Insufficient documentation

## 2014-05-31 DIAGNOSIS — Y9389 Activity, other specified: Secondary | ICD-10-CM | POA: Diagnosis not present

## 2014-05-31 DIAGNOSIS — I4892 Unspecified atrial flutter: Secondary | ICD-10-CM | POA: Diagnosis not present

## 2014-05-31 DIAGNOSIS — Z87891 Personal history of nicotine dependence: Secondary | ICD-10-CM | POA: Insufficient documentation

## 2014-05-31 DIAGNOSIS — Z87440 Personal history of urinary (tract) infections: Secondary | ICD-10-CM | POA: Insufficient documentation

## 2014-05-31 DIAGNOSIS — G8929 Other chronic pain: Secondary | ICD-10-CM | POA: Diagnosis not present

## 2014-05-31 DIAGNOSIS — Z8719 Personal history of other diseases of the digestive system: Secondary | ICD-10-CM | POA: Insufficient documentation

## 2014-05-31 DIAGNOSIS — W010XXA Fall on same level from slipping, tripping and stumbling without subsequent striking against object, initial encounter: Secondary | ICD-10-CM | POA: Insufficient documentation

## 2014-05-31 DIAGNOSIS — S6980XA Other specified injuries of unspecified wrist, hand and finger(s), initial encounter: Secondary | ICD-10-CM | POA: Insufficient documentation

## 2014-05-31 DIAGNOSIS — S62607A Fracture of unspecified phalanx of left little finger, initial encounter for closed fracture: Secondary | ICD-10-CM

## 2014-05-31 DIAGNOSIS — G43909 Migraine, unspecified, not intractable, without status migrainosus: Secondary | ICD-10-CM | POA: Insufficient documentation

## 2014-05-31 DIAGNOSIS — Z8739 Personal history of other diseases of the musculoskeletal system and connective tissue: Secondary | ICD-10-CM | POA: Diagnosis not present

## 2014-05-31 DIAGNOSIS — Z87442 Personal history of urinary calculi: Secondary | ICD-10-CM | POA: Insufficient documentation

## 2014-05-31 DIAGNOSIS — IMO0002 Reserved for concepts with insufficient information to code with codable children: Secondary | ICD-10-CM | POA: Insufficient documentation

## 2014-05-31 MED ORDER — HYDROCODONE-ACETAMINOPHEN 5-325 MG PO TABS: 1.0000 | ORAL_TABLET | Freq: Four times a day (QID) | ORAL | Status: DC | PRN

## 2014-05-31 MED ORDER — BACITRACIN 500 UNIT/GM EX OINT
1.0000 "application " | TOPICAL_OINTMENT | Freq: Two times a day (BID) | CUTANEOUS | Status: DC
  Administered 2014-05-31: 1 via TOPICAL
  Filled 2014-05-31: qty 0.9

## 2014-05-31 MED ORDER — HYDROCODONE-ACETAMINOPHEN 5-325 MG PO TABS
1.0000 | ORAL_TABLET | Freq: Once | ORAL | Status: AC
  Administered 2014-05-31: 1 via ORAL
  Filled 2014-05-31: qty 1

## 2014-05-31 NOTE — ED Provider Notes (Signed)
CSN: [id number]     Arrival date & time 05/31/14  0707 History   None    No chief complaint on file.    (Consider location/radiation/quality/duration/timing/severity/associated sxs/prior Treatment) HPI Complains of right flank pain and pain at left fifth finger after she slipped and fell on a wet floor yesterday 4 PM in her home. Treated herself with hydrocodone. Pain is worse with movement improved with remaining still. No other associated symptoms. Ambulatory since the event. No other associated symptoms. Pain nonradiating. She sustained a small laceration to her left fifth finger as result of the fall Past Medical History  Diagnosis Date  . DDD (degenerative disc disease), cervical   . GERD (gastroesophageal reflux disease)   . Migraines 04/02/12    "some"  . Chronic lower back pain   . Recurrent UTI   . Paroxysmal atrial fibrillation   . Atrial flutter   . Kidney stones   . Paroxysmal atrial fibrillation    Past Surgical History  Procedure Laterality Date  . Back surgery    . Anterior cervical decomp/discectomy fusion  ~ 2009  . Appendectomy  1980's  . Posterior fusion lumbar spine  ~ 2008  . Refractive surgery  1990's    bilaterally  . Doppler echocardiography  04/03/2012    2-D  . Nm myoview ltd      resting images reveal a normal pattern of perfusion in all refions, the post stress muyocardial perfusion images show a normal pattern of perfusion in all regions, the post stress left ventricle is normal in [place], the rest left vehtricle is normal in size, there is no scintigraphix evidence of inducible muyocardial ischemia, the post stress ejection fraction is 61%, normal myocardial perfusion stu   Family History  Problem Relation Age of Onset  . Hypertension     History  Substance Use Topics  . Smoking status: Former Smoker -- 0.50 packs/day for 20 years    Types: Cigarettes    Quit date: 11/07/2001  . Smokeless tobacco: Never Used  . Alcohol Use: No   OB History    Grav Para Term Preterm Abortions TAB SAB Ect Mult Living                 Review of Systems  Constitutional: Negative.   HENT: Negative.   Respiratory: Negative.   Cardiovascular: Negative.   Gastrointestinal: Negative.   Genitourinary:       Post menopausal  Musculoskeletal: Positive for back pain.  Skin: Positive for wound.  Neurological: Negative.   Psychiatric/Behavioral: Negative.   All other systems reviewed and are negative.     Allergies  Review of patient's allergies indicates no known allergies.  Home Medications   Prior to Admission medications   Medication Sig Start Date End Date Taking? Authorizing Provider  desvenlafaxine (PRISTIQ) 100 MG 24 hr tablet Take 1  tablet by mouth daily 03/11/14   Himabindu Ravi, MD  erythromycin with ethanol (THERAMYCIN) 2 % external solution as needed. 01/08/14   Historical Provider, MD  flecainide (TAMBOCOR) 50 MG tablet Take 50 mg by mouth as needed. 02/14/13 03/12/14  Thompson Grayer, MD  hydrochlorothiazide (MICROZIDE) 12.5 MG capsule 12.5 mg as needed. 09/06/13   Historical Provider, MD  LORazepam (ATIVAN) 0.5 MG tablet Take 0.5 mg by mouth at bedtime as needed. For sleep    Historical Provider, MD  methocarbamol (ROBAXIN) 500 MG tablet 500 mg as needed. 06/25/13   Historical Provider, MD  metoprolol tartrate (LOPRESSOR) 50 MG tablet Take 1 tablet (  50 mg total) by mouth 2 (two) times daily. Take one by mouth twice daily 03/12/14   Lorretta Harp, MD  Multiple Vitamin (MULITIVITAMIN WITH MINERALS) TABS Take 1 tablet by mouth daily.    Historical Provider, MD  NEXIUM 40 MG capsule as needed. 06/25/12   Historical Provider, MD  ondansetron (ZOFRAN ODT) 4 MG disintegrating tablet Take 1 tablet (4 mg total) by mouth every 8 (eight) hours as needed for nausea. 02/22/13   Mylinda Latina III, MD  oxyCODONE (ROXICODONE) 15 MG immediate release tablet Take 15 mg by mouth every 4 (four) hours as needed. For pain    Historical Provider, MD   oxyCODONE-acetaminophen (PERCOCET/ROXICET) 5-325 MG per tablet Take 1 tablet by mouth every 4 (four) hours as needed for pain. 02/22/13   Mylinda Latina III, MD  RESTASIS 0.05 % ophthalmic emulsion as needed. 04/25/12   Historical Provider, MD  rizatriptan (MAXALT-MLT) 10 MG disintegrating tablet 10 mg as needed.  02/26/13   Historical Provider, MD   There were no vitals taken for this visit. Physical Exam  Nursing note and vitals reviewed. Constitutional: She is oriented to person, place, and time. She appears well-developed and well-nourished.  HENT:  Head: Normocephalic and atraumatic.  Eyes: Conjunctivae are normal. Pupils are equal, round, and reactive to light.  Neck: Neck supple. No tracheal deviation present. No thyromegaly present.  Cardiovascular: Normal rate and regular rhythm.   No murmur heard. Pulmonary/Chest: Effort normal and breath sounds normal.  Abdominal: Soft. Bowel sounds are normal. She exhibits no distension. There is no tenderness.  Genitourinary:  Mild right flank tenderness  Musculoskeletal: Normal range of motion. She exhibits no edema and no tenderness.  Entire spine nontender. Pelvis stable nontender. Left hand with 3 mm crescent-shaped laceration at the volar aspect of the finger, middle phalanx. No phalanx is ecchymotic and tender. No soft tissue swelling. Limited flexion secondary to pain.  Neurological: She is alert and oriented to person, place, and time. No cranial nerve deficit. Coordination normal.  Gait normal motor strength 5 over 5 overall  Skin: Skin is warm and dry. No rash noted.  Psychiatric: She has a normal mood and affect.    ED Course  Procedures (including critical care time) Labs Review Labs Reviewed - No data to display  Imaging Review No results found.   EKG Interpretation None     X-ray viewed by me and discussed with radiologist Results for orders placed during the hospital encounter of 02/22/13  PREGNANCY, URINE       Result Value Ref Range   Preg Test, Ur NEGATIVE  NEGATIVE  URINALYSIS, ROUTINE W REFLEX MICROSCOPIC      Result Value Ref Range   Color, Urine AMBER (*) YELLOW   APPearance CLOUDY (*) CLEAR   Specific Gravity, Urine 1.028  1.005 - 1.030   pH 6.0  5.0 - 8.0   Glucose, UA NEGATIVE  NEGATIVE mg/dL   Hgb urine dipstick LARGE (*) NEGATIVE   Bilirubin Urine SMALL (*) NEGATIVE   Ketones, ur NEGATIVE  NEGATIVE mg/dL   Protein, ur 30 (*) NEGATIVE mg/dL   Urobilinogen, UA 0.2  0.0 - 1.0 mg/dL   Nitrite NEGATIVE  NEGATIVE   Leukocytes, UA NEGATIVE  NEGATIVE  URINE MICROSCOPIC-ADD ON      Result Value Ref Range   Squamous Epithelial / LPF FEW (*) RARE   WBC, UA 0-2  <3 WBC/hpf   RBC / HPF 7-10  <3 RBC/hpf   Bacteria, UA RARE  RARE   Urine-Other MUCOUS PRESENT     Dg Finger Little Left  05/31/2014   CLINICAL DATA:  [place] and injured left small finger yesterday with laceration.  EXAM: LEFT LITTLE FINGER 2+V  COMPARISON:  None.  FINDINGS: Avulsion fracture involving the volar base of the middle phalanx. No other fractures. Well preserved joint spaces. Well preserved bone mineral density. Incidental bone island in the head of 5th metacarpal.  IMPRESSION: Volar plate injury with avulsion fracture involving the volar base of the middle phalanx.  Results were discussed by telephone with Dr. Winfred Leeds of the emergency department at the time of interpretation 05/31/2014 0800 hr.   Electronically Signed   By: Evangeline Dakin M.D.   On: 05/31/2014 08:04    MDM  Laceration to fifth finger does not require repair. It is very superficial. I do not believe that this is an open fracture. Plan local wound care. Finger splint. Prescription Norco X-ray of back not indicated. Discussed with patient who agrees. Referral Dr. Amedeo Plenty, whom she seen in the past. Splint is comfortable for pt  Diagnosis #1 fall #2 closed fracture left fifth finger #3 laceration left fifth finger #4 contusion right flank Final  diagnoses:  None        Orlie Dakin, MD 05/31/14 [phone]

## 2014-05-31 NOTE — ED Notes (Signed)
Pt reports falling when walking across a wet floor yesterday.  Landed on her (L) hip.  Pt reports (L) pinky finger pain-slight swelling and redness noted- and (R) hip pain.  Pt ambulatory without difficulty.

## 2014-05-31 NOTE — Discharge Instructions (Signed)
Take Tylenol for mild pain or the pain medicine prescribed for bad pain.Call Dr. Vanetta Shawl office in 2 days to schedule an appointment. Tell office staff that you were seen here when scheduling the appointment

## 2014-06-24 ENCOUNTER — Other Ambulatory Visit: Payer: Self-pay

## 2014-06-25 ENCOUNTER — Encounter: Payer: Self-pay | Admitting: Internal Medicine

## 2014-06-25 ENCOUNTER — Ambulatory Visit (INDEPENDENT_AMBULATORY_CARE_PROVIDER_SITE_OTHER): Payer: Medicare Other | Admitting: Internal Medicine

## 2014-06-25 ENCOUNTER — Telehealth: Payer: Self-pay

## 2014-06-25 VITALS — BP 104/76 | HR 49 | Ht 67.0 in | Wt 181.0 lb

## 2014-06-25 DIAGNOSIS — I4891 Unspecified atrial fibrillation: Secondary | ICD-10-CM

## 2014-06-25 DIAGNOSIS — I480 Paroxysmal atrial fibrillation: Secondary | ICD-10-CM

## 2014-06-25 MED ORDER — METOPROLOL TARTRATE 50 MG PO TABS: 50.0000 mg | ORAL_TABLET | Freq: Two times a day (BID) | ORAL | Status: DC

## 2014-06-25 MED ORDER — FLECAINIDE ACETATE 50 MG PO TABS: 50.0000 mg | ORAL_TABLET | Freq: Two times a day (BID) | ORAL | Status: DC

## 2014-06-25 NOTE — Progress Notes (Signed)
HPI [name] returns today for followup. She is a very pleasant 53year-old woman with paroxysmal atrial fibrillation and sinus bradycardia. In the interim, she has done well. She notes that she has symptomatic palpitations once or twice a month, most recently when she does stenuous activity like working in the yard. These may last an hour or more. She has not sought medical attention. She is taking flecainide 50 mg daily along with metoprolol. When her heart was out of rhythm, she will take an additional metoprolol and sometimes an additional flecainide. She denies chest pain, shortness of breath, or syncope. She does note that she is saddened over the loss of her father who died of a heart attack a few weeks ago. No Known Allergies   Current Outpatient Prescriptions  Medication Sig Dispense Refill  . desvenlafaxine (PRISTIQ) 100 MG 24 hr tablet Take 1  tablet by mouth daily  30 tablet  2  . erythromycin with ethanol (THERAMYCIN) 2 % external solution as needed.      . flecainide (TAMBOCOR) 50 MG tablet Take 50 mg by mouth as needed.      Marland Kitchen LORazepam (ATIVAN) 0.5 MG tablet Take 0.5 mg by mouth at bedtime as needed. For sleep      . methocarbamol (ROBAXIN) 500 MG tablet 500 mg as needed.      . metoprolol tartrate (LOPRESSOR) 50 MG tablet Take 1 tablet (50 mg total) by mouth 2 (two) times daily. Take one by mouth twice daily  180 tablet  3  . montelukast (SINGULAIR) 10 MG tablet Take 10 mg by mouth.      . Multiple Vitamin (MULITIVITAMIN WITH MINERALS) TABS Take 1 tablet by mouth daily.      Marland Kitchen NEXIUM 40 MG capsule as needed.      Marland Kitchen oxyCODONE (ROXICODONE) 15 MG immediate release tablet Take 15 mg by mouth every 4 (four) hours as needed. For pain      . RESTASIS 0.05 % ophthalmic emulsion as needed.      . rizatriptan (MAXALT-MLT) 10 MG disintegrating tablet 10 mg as needed.        No current facility-administered medications for this visit.     Past Medical History  Diagnosis Date  . DDD  (degenerative disc disease), cervical   . GERD (gastroesophageal reflux disease)   . Migraines 04/02/12    "some"  . Chronic lower back pain   . Recurrent UTI   . Paroxysmal atrial fibrillation   . Atrial flutter   . Kidney stones   . Paroxysmal atrial fibrillation     ROS:   All systems reviewed and negative except as noted in the HPI.   Past Surgical History  Procedure Laterality Date  . Back surgery    . Anterior cervical decomp/discectomy fusion  ~ 2009  . Appendectomy  1980's  . Posterior fusion lumbar spine  ~ 2008  . Refractive surgery  1990's    bilaterally  . Doppler echocardiography  04/03/2012    2-D  . Nm myoview ltd      resting images reveal a normal pattern of perfusion in all refions, the post stress muyocardial perfusion images show a normal pattern of perfusion in all regions, the post stress left ventricle is normal in [place], the rest left vehtricle is normal in size, there is no scintigraphix evidence of inducible muyocardial ischemia, the post stress ejection fraction is 61%, normal myocardial perfusion stu     Family History  Problem Relation Age of Onset  .  Hypertension       History   Social History  . Marital Status: Married    Spouse Name: N/A    Number of Children: N/A  . Years of Education: N/A   Occupational History  . Disabled    Social History Main Topics  . Smoking status: Former Smoker -- 0.50 packs/day for 20 years    Types: Cigarettes    Quit date: 11/07/2001  . Smokeless tobacco: Never Used  . Alcohol Use: No  . Drug Use: No  . Sexual Activity: Yes    Birth Control/ Protection: Pill   Other Topics Concern  . Not on file   Social History Narrative   Pt lives with husband in [place].  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     Ht 5\' 7"  (1.702 m)  Wt 181 lb (82.101 kg)  BMI 28.34 kg/m2  LMP 12/25/2012  Physical Exam:  Well appearing middle-aged woman,NAD HEENT:  Unremarkable Neck:  No JVD, no thyromegally Back:  No CVA tenderness Lungs:  Clear with no wheezes, rales, or rhonchi. HEART:  Regular bradycardia rhythm, no murmurs, no rubs, no clicks Abd:  soft, positive bowel sounds, no organomegally, no rebound, no guarding Ext:  2 plus pulses, no edema, no cyanosis, no clubbing Skin:  No rashes no nodules Neuro:  CN II through XII intact, motor grossly intact  EKG - sinus bradycardia  Assess/Plan:

## 2014-06-25 NOTE — Assessment & Plan Note (Signed)
I have asked the patient to take her flecainide and metoprolol twice daily. She may take up to 6 tabs a day as needed.

## 2014-06-25 NOTE — Patient Instructions (Signed)
Your physician has recommended you make the following change in your medication:  1) Take Flecainide 50 mg twice daily.  Your physician wants you to follow-up in: 1 year with Dr. Lovena Le.  You will receive a reminder letter in the mail two months in advance. If you don't receive a letter, please call our office to schedule the follow-up appointment.

## 2014-07-04 ENCOUNTER — Other Ambulatory Visit: Payer: Self-pay | Admitting: Urology

## 2014-07-10 ENCOUNTER — Other Ambulatory Visit: Payer: Self-pay | Admitting: Allergy and Immunology

## 2014-07-10 DIAGNOSIS — J310 Chronic rhinitis: Secondary | ICD-10-CM

## 2014-07-16 ENCOUNTER — Ambulatory Visit
Admission: RE | Admit: 2014-07-16 | Discharge: 2014-07-16 | Disposition: A | Discharge status: Home / Self Care | Payer: Medicare Other | Source: Ambulatory Visit | Attending: Allergy and Immunology | Admitting: Allergy and Immunology

## 2014-07-16 DIAGNOSIS — J310 Chronic rhinitis: Secondary | ICD-10-CM

## 2014-07-25 ENCOUNTER — Encounter (HOSPITAL_BASED_OUTPATIENT_CLINIC_OR_DEPARTMENT_OTHER): Payer: Self-pay | Admitting: *Deleted

## 2014-07-26 ENCOUNTER — Encounter (HOSPITAL_BASED_OUTPATIENT_CLINIC_OR_DEPARTMENT_OTHER): Payer: Self-pay | Admitting: Emergency Medicine

## 2014-07-26 ENCOUNTER — Emergency Department (HOSPITAL_BASED_OUTPATIENT_CLINIC_OR_DEPARTMENT_OTHER)
Admission: EM | Admit: 2014-07-26 | Discharge: 2014-07-26 | Disposition: A | Discharge status: Home / Self Care | Payer: Medicare Other | Attending: Emergency Medicine | Admitting: Emergency Medicine

## 2014-07-26 DIAGNOSIS — M26609 Unspecified temporomandibular joint disorder, unspecified side: Secondary | ICD-10-CM | POA: Diagnosis not present

## 2014-07-26 DIAGNOSIS — G8929 Other chronic pain: Secondary | ICD-10-CM | POA: Insufficient documentation

## 2014-07-26 DIAGNOSIS — M26629 Arthralgia of temporomandibular joint, unspecified side: Secondary | ICD-10-CM

## 2014-07-26 DIAGNOSIS — Z79899 Other long term (current) drug therapy: Secondary | ICD-10-CM | POA: Diagnosis not present

## 2014-07-26 DIAGNOSIS — G43909 Migraine, unspecified, not intractable, without status migrainosus: Secondary | ICD-10-CM | POA: Diagnosis not present

## 2014-07-26 DIAGNOSIS — Z87442 Personal history of urinary calculi: Secondary | ICD-10-CM | POA: Insufficient documentation

## 2014-07-26 DIAGNOSIS — Z87891 Personal history of nicotine dependence: Secondary | ICD-10-CM | POA: Diagnosis not present

## 2014-07-26 DIAGNOSIS — K219 Gastro-esophageal reflux disease without esophagitis: Secondary | ICD-10-CM | POA: Diagnosis not present

## 2014-07-26 DIAGNOSIS — Z8719 Personal history of other diseases of the digestive system: Secondary | ICD-10-CM | POA: Insufficient documentation

## 2014-07-26 DIAGNOSIS — H9209 Otalgia, unspecified ear: Secondary | ICD-10-CM | POA: Insufficient documentation

## 2014-07-26 MED ORDER — HYDROMORPHONE HCL 1 MG/ML IJ SOLN
2.0000 mg | Freq: Once | INTRAMUSCULAR | Status: AC
  Administered 2014-07-26: 2 mg via INTRAMUSCULAR

## 2014-07-26 MED ORDER — HYDROMORPHONE HCL 4 MG PO TABS: 2.0000 mg | ORAL_TABLET | ORAL | Status: DC | PRN

## 2014-07-26 MED ORDER — DIAZEPAM 5 MG PO TABS: 5.0000 mg | ORAL_TABLET | Freq: Four times a day (QID) | ORAL | Status: DC | PRN

## 2014-07-26 MED ORDER — HYDROMORPHONE HCL 1 MG/ML IJ SOLN
INTRAMUSCULAR | Status: AC
  Filled 2014-07-26: qty 2

## 2014-07-26 MED ORDER — DIAZEPAM 5 MG PO TABS
5.0000 mg | ORAL_TABLET | Freq: Once | ORAL | Status: AC
  Administered 2014-07-26: 5 mg via ORAL
  Filled 2014-07-26: qty 1

## 2014-07-26 NOTE — ED Notes (Signed)
Pt presents to ED with complaints of left ear pain and throat pain that started Thursday.

## 2014-07-26 NOTE — ED Provider Notes (Addendum)
CSN: [id number]     Arrival date & time 07/26/14  0019 History   First MD Initiated Contact with Patient 07/26/14 0046     Chief Complaint  Patient presents with  . Ear Pain      (Consider location/radiation/quality/duration/timing/severity/associated sxs/prior Treatment) HPI This is a 53 year old female with a history of TMJ disorder. She is here with a two-day history of severe pain in her left TMJ. The pain radiates to the left side of her face. It is worse with palpation of the joint, chewing or swallowing. She denies fever. She denies swelling of the face. There is no associated erythema or warmth. She states she's been using her bite block without relief. She has been taking oxycodone 15 mg (which she has for back pain) without relief.  Past Medical History  Diagnosis Date  . GERD (gastroesophageal reflux disease)   . Chronic lower back pain   . Paroxysmal atrial fibrillation   . Migraines   . Spinal stenosis   . SUI (stress urinary incontinence, female)   . History of kidney stones   . TMJ (temporomandibular joint syndrome)    Past Surgical History  Procedure Laterality Date  . Anterior cervical decomp/discectomy fusion  04-08-2010    C4 -- C5  . Posterior fusion lumbar spine  09-16-2009    L4 -- L5  . Appendectomy  1984  . Transthoracic echocardiogram  04-03-2012    normal LVF/  ef 55-60%  . Cardiovascular stress test  04-27-2014   dr berry    normal perfusion study/  no ischemia/  ef 61%  . Lasik  1990's   Family History  Problem Relation Age of Onset  . Hypertension     History  Substance Use Topics  . Smoking status: Former Smoker -- 0.50 packs/day for 20 years    Types: Cigarettes    Quit date: 11/07/2001  . Smokeless tobacco: Never Used  . Alcohol Use: No   OB History   Grav Para Term Preterm Abortions TAB SAB Ect Mult Living                 Review of Systems  All other systems reviewed and are negative.  Allergies  Review of patient's allergies  indicates no known allergies.  Home Medications   Prior to Admission medications   Medication Sig Start Date End Date Taking? Authorizing Provider  desvenlafaxine (PRISTIQ) 100 MG 24 hr tablet Take 1  tablet by mouth daily 03/11/14   Himabindu Ravi, MD  erythromycin with ethanol (THERAMYCIN) 2 % external solution as needed. 01/08/14   Historical Provider, MD  flecainide (TAMBOCOR) 50 MG tablet Take 1 tablet (50 mg total) by mouth 2 (two) times daily. 06/25/14   Evans Lance, MD  LORazepam (ATIVAN) 0.5 MG tablet Take 0.5 mg by mouth at bedtime as needed. For sleep    Historical Provider, MD  methocarbamol (ROBAXIN) 500 MG tablet 500 mg as needed. 06/25/13   Historical Provider, MD  metoprolol (LOPRESSOR) 50 MG tablet Take 1 tablet (50 mg total) by mouth 2 (two) times daily. 06/25/14   Evans Lance, MD  montelukast (SINGULAIR) 10 MG tablet Take 10 mg by mouth.    Historical Provider, MD  Multiple Vitamin (MULITIVITAMIN WITH MINERALS) TABS Take 1 tablet by mouth daily.    Historical Provider, MD  NEXIUM 40 MG capsule as needed. 06/25/12   Historical Provider, MD  oxyCODONE (ROXICODONE) 15 MG immediate release tablet Take 15 mg by mouth every 4 (four)  hours as needed. For pain    Historical Provider, MD  RESTASIS 0.05 % ophthalmic emulsion as needed. 04/25/12   Historical Provider, MD  rizatriptan (MAXALT-MLT) 10 MG disintegrating tablet 10 mg as needed.  02/26/13   Historical Provider, MD   BP 128/69  Pulse 54  Temp(Src) 98.7 F (37.1 C) (Oral)  Resp 20  Ht 5\' 7"  (1.702 m)  Wt 175 lb (79.379 kg)  BMI 27.40 kg/m2  SpO2 100%  LMP 12/25/2012  Physical Exam General: Well-developed, well-nourished female in no acute distress; appearance consistent with age of record HENT: normocephalic; atraumatic; tenderness of the left TMJ with pain on range of motion, mild spasm muscle palpable; no parotid enlargement or tenderness; TMs normal Eyes: pupils equal, round and reactive to light; extraocular muscles  intact Neck: supple; no lymphadenopathy Heart: regular rate and rhythm Lungs: clear to auscultation bilaterally Abdomen: soft; nondistended; nontender Extremities: No deformity; full range of motion Neurologic: Awake, alert and oriented; motor function intact in all extremities and symmetric; no facial droop Skin: Warm and dry Psychiatric: Tearful    ED Course  Procedures (including critical care time)  MDM  We will prescribe Dilaudid and Valium for the weekend and she will contact her dentist on Monday.   Wynetta Fines, MD 07/26/14 0107  Wynetta Fines, MD 07/26/14 3300

## 2014-07-26 NOTE — ED Notes (Signed)
MD at bedside. 

## 2014-07-28 ENCOUNTER — Encounter (HOSPITAL_BASED_OUTPATIENT_CLINIC_OR_DEPARTMENT_OTHER): Payer: Self-pay | Admitting: *Deleted

## 2014-07-28 NOTE — Progress Notes (Signed)
NPO AFTER MN. ARRIVE AT 0600. NEEDS HG. WILL TAKE FLECAINIDE , METOPROLOL AND NEXIUM AM DOS W/ SIPS OF WATER. REVIEWED [facility] GUIDELINES , WILL BRING MEDS.

## 2014-08-03 NOTE — Anesthesia Preprocedure Evaluation (Signed)
Anesthesia Evaluation  Patient identified by MRN, date of birth, ID band Patient awake    Reviewed: Allergy & Precautions, H&P , NPO status , Patient's Chart, lab work & pertinent test results, reviewed documented beta blocker date and time   Airway Mallampati: II TM Distance: >3 FB Neck ROM: Full    Dental no notable dental hx.    Pulmonary former smoker,  breath sounds clear to auscultation  Pulmonary exam normal       Cardiovascular Pt. on home beta blockers + dysrhythmias Atrial Fibrillation Rhythm:Regular Rate:Normal     Neuro/Psych  Headaches, PSYCHIATRIC DISORDERS Depression    GI/Hepatic Neg liver ROS, hiatal hernia, GERD-  ,  Endo/Other  negative endocrine ROS  Renal/GU negative Renal ROS     Musculoskeletal  (+) Arthritis -, Fibromyalgia -  Abdominal   Peds  Hematology negative hematology ROS (+)   Anesthesia Other Findings   Reproductive/Obstetrics negative OB ROS                           Anesthesia Physical Anesthesia Plan  ASA: II  Anesthesia Plan: General   Post-op Pain Management:    Induction: Intravenous  Airway Management Planned:   Additional Equipment:   Intra-op Plan:   Post-operative Plan: Extubation in OR  Informed Consent: I have reviewed the patients History and Physical, chart, labs and discussed the procedure including the risks, benefits and alternatives for the proposed anesthesia with the patient or authorized representative who has indicated his/her understanding and acceptance.   Dental advisory given  Plan Discussed with: CRNA  Anesthesia Plan Comments:         Anesthesia Quick Evaluation

## 2014-08-04 ENCOUNTER — Encounter (HOSPITAL_BASED_OUTPATIENT_CLINIC_OR_DEPARTMENT_OTHER): Payer: Self-pay | Admitting: Urology

## 2014-08-04 ENCOUNTER — Ambulatory Visit (HOSPITAL_BASED_OUTPATIENT_CLINIC_OR_DEPARTMENT_OTHER)
Admission: RE | Admit: 2014-08-04 | Discharge: 2014-08-04 | Disposition: A | Discharge status: Home / Self Care | Payer: Medicare Other | Source: Ambulatory Visit | Attending: Urology | Admitting: Urology

## 2014-08-04 ENCOUNTER — Encounter (HOSPITAL_BASED_OUTPATIENT_CLINIC_OR_DEPARTMENT_OTHER)
Admission: RE | Disposition: A | Discharge status: Home / Self Care | Payer: Self-pay | Source: Ambulatory Visit | Attending: Urology

## 2014-08-04 ENCOUNTER — Encounter (HOSPITAL_BASED_OUTPATIENT_CLINIC_OR_DEPARTMENT_OTHER): Payer: Medicare Other | Admitting: Anesthesiology

## 2014-08-04 ENCOUNTER — Ambulatory Visit (HOSPITAL_BASED_OUTPATIENT_CLINIC_OR_DEPARTMENT_OTHER): Payer: Medicare Other | Admitting: Anesthesiology

## 2014-08-04 DIAGNOSIS — Z79899 Other long term (current) drug therapy: Secondary | ICD-10-CM | POA: Diagnosis not present

## 2014-08-04 DIAGNOSIS — N393 Stress incontinence (female) (male): Secondary | ICD-10-CM | POA: Diagnosis not present

## 2014-08-04 DIAGNOSIS — F3289 Other specified depressive episodes: Secondary | ICD-10-CM | POA: Insufficient documentation

## 2014-08-04 DIAGNOSIS — Z87442 Personal history of urinary calculi: Secondary | ICD-10-CM | POA: Insufficient documentation

## 2014-08-04 DIAGNOSIS — Z87891 Personal history of nicotine dependence: Secondary | ICD-10-CM | POA: Diagnosis not present

## 2014-08-04 DIAGNOSIS — Z87440 Personal history of urinary (tract) infections: Secondary | ICD-10-CM | POA: Diagnosis not present

## 2014-08-04 DIAGNOSIS — I4891 Unspecified atrial fibrillation: Secondary | ICD-10-CM | POA: Diagnosis not present

## 2014-08-04 DIAGNOSIS — M129 Arthropathy, unspecified: Secondary | ICD-10-CM | POA: Insufficient documentation

## 2014-08-04 DIAGNOSIS — F329 Major depressive disorder, single episode, unspecified: Secondary | ICD-10-CM | POA: Insufficient documentation

## 2014-08-04 HISTORY — DX: Stress incontinence (female) (male): N39.3

## 2014-08-04 HISTORY — DX: Spinal stenosis, site unspecified: M48.00

## 2014-08-04 HISTORY — DX: Personal history of other diseases of the digestive system: Z87.19

## 2014-08-04 HISTORY — DX: Unspecified temporomandibular joint disorder, unspecified side: M26.609

## 2014-08-04 HISTORY — DX: Personal history of urinary calculi: Z87.442

## 2014-08-04 HISTORY — PX: CYSTOSCOPY: SHX5120

## 2014-08-04 HISTORY — PX: PUBOVAGINAL SLING: SHX1035

## 2014-08-04 LAB — POCT HEMOGLOBIN-HEMACUE: HEMOGLOBIN: 13.1 g/dL (ref 12.0–15.0)

## 2014-08-04 SURGERY — CYSTOSCOPY
Anesthesia: General | Site: Vagina

## 2014-08-04 MED ORDER — CIPROFLOXACIN IN D5W 400 MG/200ML IV SOLN
INTRAVENOUS | Status: AC
  Filled 2014-08-04: qty 200

## 2014-08-04 MED ORDER — METOPROLOL TARTRATE 1 MG/ML IV SOLN
2.5000 mg | INTRAVENOUS | Status: DC | PRN
  Administered 2014-08-04 (×2): 2.5 mg via INTRAVENOUS
  Filled 2014-08-04 (×2): qty 5

## 2014-08-04 MED ORDER — DEXAMETHASONE SODIUM PHOSPHATE 4 MG/ML IJ SOLN
INTRAMUSCULAR | Status: DC | PRN
  Administered 2014-08-04: 10 mg via INTRAVENOUS

## 2014-08-04 MED ORDER — ONDANSETRON HCL 4 MG/2ML IJ SOLN
INTRAMUSCULAR | Status: DC | PRN
  Administered 2014-08-04: 4 mg via INTRAVENOUS

## 2014-08-04 MED ORDER — OXYCODONE HCL 5 MG PO TABS
5.0000 mg | ORAL_TABLET | Freq: Once | ORAL | Status: DC | PRN
  Filled 2014-08-04: qty 1

## 2014-08-04 MED ORDER — PROPOFOL 10 MG/ML IV BOLUS
INTRAVENOUS | Status: DC | PRN
  Administered 2014-08-04: 50 mg via INTRAVENOUS
  Administered 2014-08-04: 200 mg via INTRAVENOUS

## 2014-08-04 MED ORDER — SODIUM CHLORIDE 0.9 % IR SOLN
Status: DC | PRN
  Administered 2014-08-04: 09:00:00

## 2014-08-04 MED ORDER — CLINDAMYCIN PHOSPHATE 2 % VA CREA
1.0000 | TOPICAL_CREAM | Freq: Once | VAGINAL | Status: AC
  Administered 2014-08-04: 1 via VAGINAL
  Filled 2014-08-04 (×2): qty 40

## 2014-08-04 MED ORDER — FENTANYL CITRATE 0.05 MG/ML IJ SOLN
INTRAMUSCULAR | Status: AC
  Filled 2014-08-04: qty 6

## 2014-08-04 MED ORDER — CIPROFLOXACIN IN D5W 400 MG/200ML IV SOLN
400.0000 mg | INTRAVENOUS | Status: AC
  Administered 2014-08-04: 400 mg via INTRAVENOUS
  Filled 2014-08-04: qty 200

## 2014-08-04 MED ORDER — MIDAZOLAM HCL 2 MG/2ML IJ SOLN
INTRAMUSCULAR | Status: AC
  Filled 2014-08-04: qty 2

## 2014-08-04 MED ORDER — PHENYLEPHRINE HCL 10 MG/ML IJ SOLN
INTRAMUSCULAR | Status: DC | PRN
  Administered 2014-08-04 (×3): 40 ug via INTRAVENOUS

## 2014-08-04 MED ORDER — DOCUSATE SODIUM 100 MG PO CAPS: 100.0000 mg | ORAL_CAPSULE | Freq: Two times a day (BID) | ORAL | Status: DC | PRN

## 2014-08-04 MED ORDER — SODIUM CHLORIDE 0.9 % IV SOLN
10.0000 mg | INTRAVENOUS | Status: DC | PRN
  Administered 2014-08-04: 25 ug/min via INTRAVENOUS

## 2014-08-04 MED ORDER — FENTANYL CITRATE 0.05 MG/ML IJ SOLN
INTRAMUSCULAR | Status: DC | PRN
  Administered 2014-08-04: 25 ug via INTRAVENOUS
  Administered 2014-08-04: 50 ug via INTRAVENOUS
  Administered 2014-08-04: 25 ug via INTRAVENOUS

## 2014-08-04 MED ORDER — PROMETHAZINE HCL 25 MG/ML IJ SOLN
6.2500 mg | INTRAMUSCULAR | Status: DC | PRN
  Filled 2014-08-04: qty 1

## 2014-08-04 MED ORDER — METOCLOPRAMIDE HCL 5 MG/ML IJ SOLN
INTRAMUSCULAR | Status: DC | PRN
  Administered 2014-08-04: 10 mg via INTRAVENOUS

## 2014-08-04 MED ORDER — ACETAMINOPHEN 10 MG/ML IV SOLN
INTRAVENOUS | Status: DC | PRN
  Administered 2014-08-04: 1000 mg via INTRAVENOUS

## 2014-08-04 MED ORDER — OXYCODONE HCL 5 MG/5ML PO SOLN
5.0000 mg | Freq: Once | ORAL | Status: DC | PRN
  Filled 2014-08-04: qty 5

## 2014-08-04 MED ORDER — CEFAZOLIN SODIUM-DEXTROSE 2-3 GM-% IV SOLR
INTRAVENOUS | Status: AC
  Filled 2014-08-04: qty 50

## 2014-08-04 MED ORDER — STERILE WATER FOR IRRIGATION IR SOLN
Status: DC | PRN
  Administered 2014-08-04: 10 mL
  Administered 2014-08-04: 3000 mL via INTRAVESICAL

## 2014-08-04 MED ORDER — OXYCODONE HCL 15 MG PO TABS
15.0000 mg | ORAL_TABLET | ORAL | Status: DC | PRN
Start: 2014-08-04 — End: 2015-09-02

## 2014-08-04 MED ORDER — LIDOCAINE HCL (CARDIAC) 20 MG/ML IV SOLN
INTRAVENOUS | Status: DC | PRN
  Administered 2014-08-04: 60 mg via INTRAVENOUS

## 2014-08-04 MED ORDER — KETOROLAC TROMETHAMINE 30 MG/ML IJ SOLN
INTRAMUSCULAR | Status: DC | PRN
  Administered 2014-08-04: 30 mg via INTRAVENOUS

## 2014-08-04 MED ORDER — LACTATED RINGERS IV SOLN
INTRAVENOUS | Status: DC
Start: 2014-08-04 — End: 2014-08-04
  Administered 2014-08-04 (×2): via INTRAVENOUS
  Filled 2014-08-04: qty 1000

## 2014-08-04 MED ORDER — CEFAZOLIN SODIUM-DEXTROSE 2-3 GM-% IV SOLR
2.0000 g | INTRAVENOUS | Status: AC
Start: 2014-08-04 — End: 2014-08-04
  Administered 2014-08-04: 2 g via INTRAVENOUS
  Filled 2014-08-04: qty 50

## 2014-08-04 MED ORDER — SODIUM CHLORIDE 0.9 % IR SOLN
Status: DC | PRN
  Administered 2014-08-04: 500 mL

## 2014-08-04 MED ORDER — LIDOCAINE-EPINEPHRINE (PF) 1 %-1:200000 IJ SOLN
INTRAMUSCULAR | Status: DC | PRN
  Administered 2014-08-04: 20 mL

## 2014-08-04 MED ORDER — MIDAZOLAM HCL 5 MG/5ML IJ SOLN
INTRAMUSCULAR | Status: DC | PRN
  Administered 2014-08-04: 2 mg via INTRAVENOUS

## 2014-08-04 MED ORDER — CEFAZOLIN SODIUM 1-5 GM-% IV SOLN
1.0000 g | INTRAVENOUS | Status: DC
  Filled 2014-08-04: qty 50

## 2014-08-04 MED ORDER — MEPERIDINE HCL 25 MG/ML IJ SOLN
6.2500 mg | INTRAMUSCULAR | Status: DC | PRN
  Filled 2014-08-04: qty 1

## 2014-08-04 MED ORDER — HYDROMORPHONE HCL 1 MG/ML IJ SOLN
0.2500 mg | INTRAMUSCULAR | Status: DC | PRN
  Filled 2014-08-04: qty 1

## 2014-08-04 SURGICAL SUPPLY — 57 items
ADH SKN CLS APL DERMABOND .7 (GAUZE/BANDAGES/DRESSINGS) ×2
BAG URINE DRAINAGE (UROLOGICAL SUPPLIES) IMPLANT
BLADE CLIPPER SURG (BLADE) ×3 IMPLANT
BLADE HEX COATED 2.75 (ELECTRODE) ×3 IMPLANT
BLADE SURG 10 STRL SS (BLADE) ×3 IMPLANT
BLADE SURG 15 STRL LF DISP TIS (BLADE) ×2 IMPLANT
BLADE SURG 15 STRL SS (BLADE) ×3
CATH FOLEY 2WAY SLVR  5CC 16FR (CATHETERS) ×1
CATH FOLEY 2WAY SLVR 5CC 16FR (CATHETERS) ×2 IMPLANT
CLOTH BEACON ORANGE TIMEOUT ST (SAFETY) ×3 IMPLANT
COVER MAYO STAND STRL (DRAPES) ×5 IMPLANT
COVER TABLE BACK 60X90 (DRAPES) ×3 IMPLANT
DERMABOND ADVANCED (GAUZE/BANDAGES/DRESSINGS) ×1
DERMABOND ADVANCED .7 DNX12 (GAUZE/BANDAGES/DRESSINGS) ×2 IMPLANT
DRAIN PENROSE 18X1/4 LTX STRL (WOUND CARE) IMPLANT
DRAPE CAMERA CLOSED 9X96 (DRAPES) ×3 IMPLANT
DRAPE UNDERBUTTOCKS STRL (DRAPE) ×3 IMPLANT
ELECT REM PT RETURN 9FT ADLT (ELECTROSURGICAL) ×3
ELECTRODE REM PT RTRN 9FT ADLT (ELECTROSURGICAL) ×2 IMPLANT
GLOVE BIO SURGEON STRL SZ7 (GLOVE) ×1 IMPLANT
GLOVE BIO SURGEON STRL SZ7.5 (GLOVE) ×6 IMPLANT
GLOVE BIO SURGEON STRL SZ8 (GLOVE) ×1 IMPLANT
GLOVE BIOGEL PI IND STRL 7.5 (GLOVE) IMPLANT
GLOVE BIOGEL PI INDICATOR 7.5 (GLOVE) ×2
GLOVE SURG SS PI 7.5 STRL IVOR (GLOVE) ×1 IMPLANT
GOWN STRL REUS W/ TWL XL LVL3 (GOWN DISPOSABLE) IMPLANT
GOWN STRL REUS W/TWL XL LVL3 (GOWN DISPOSABLE) ×7 IMPLANT
HOLDER FOLEY CATH W/STRAP (MISCELLANEOUS) ×2 IMPLANT
HOOK RETRACTION 12 ELAST STAY (MISCELLANEOUS) ×1 IMPLANT
NEEDLE HYPO 22GX1.5 SAFETY (NEEDLE) ×3 IMPLANT
NS IRRIG 500ML POUR BTL (IV SOLUTION) IMPLANT
PACK BASIN DAY SURGERY FS (CUSTOM PROCEDURE TRAY) ×3 IMPLANT
PACKING VAGINAL (PACKING) ×3 IMPLANT
PENCIL BUTTON HOLSTER BLD 10FT (ELECTRODE) ×3 IMPLANT
PLUG CATH AND CAP STER (CATHETERS) ×3 IMPLANT
RETRACTOR LONRSTAR 16.6X16.6CM (MISCELLANEOUS) IMPLANT
RETRACTOR STER APS 16.6X16.6CM (MISCELLANEOUS) ×3
SET IRRIG Y TYPE TUR BLADDER L (SET/KITS/TRAYS/PACK) ×3 IMPLANT
SHEET LAVH (DRAPES) ×3 IMPLANT
SLING SYSTEM SPARC (Sling) ×1 IMPLANT
SPONGE LAP 4X18 X RAY DECT (DISPOSABLE) ×1 IMPLANT
SUCTION FRAZIER TIP 10 FR DISP (SUCTIONS) ×1 IMPLANT
SURGILUBE 2OZ TUBE FLIPTOP (MISCELLANEOUS) ×3 IMPLANT
SUT SILK 2 0 30  PSL (SUTURE)
SUT SILK 2 0 30 PSL (SUTURE) IMPLANT
SUT VIC AB 2-0 CT1 27 (SUTURE) ×6
SUT VIC AB 2-0 CT1 TAPERPNT 27 (SUTURE) ×4 IMPLANT
SUT VICRYL 4-0 PS2 18IN ABS (SUTURE) ×3 IMPLANT
SYR BULB IRRIGATION 50ML (SYRINGE) ×3 IMPLANT
SYR CONTROL 10ML LL (SYRINGE) ×3 IMPLANT
SYRINGE 10CC LL (SYRINGE) ×3 IMPLANT
TOWEL OR 17X24 6PK STRL BLUE (TOWEL DISPOSABLE) ×4 IMPLANT
TRAY DSU PREP LF (CUSTOM PROCEDURE TRAY) ×3 IMPLANT
TUBE CONNECTING 12X1/4 (SUCTIONS) ×6 IMPLANT
WATER STERILE IRR 3000ML UROMA (IV SOLUTION) ×3 IMPLANT
WATER STERILE IRR 500ML POUR (IV SOLUTION) ×1 IMPLANT
YANKAUER SUCT BULB TIP NO VENT (SUCTIONS) ×3 IMPLANT

## 2014-08-04 NOTE — Transfer of Care (Addendum)
Immediate Anesthesia Transfer of Care Note  Patient: Miranda Taylor  Procedure(s) Performed: Procedure(s) (LRB): CYSTOSCOPY (N/A) [employer] SCI[employer]IFIC RETRO-PUBIC MID URETHRAL SLING (N/A)  Patient Location: PACU  Anesthesia Type: General  Level of Consciousness: awake, alert  and oriented  Airway & Oxygen Therapy: Patient Spontanous Breathing and Patient connected to nasal cannula oxygen  Post-op Assessment: Report given to PACU RN and Post -op Vital signs reviewed and stable  Post vital signs: Reviewed and stable  Complications: No apparent anesthesia complications

## 2014-08-04 NOTE — Anesthesia Postprocedure Evaluation (Signed)
Anesthesia Post Note  Patient: Miranda Taylor  Procedure(s) Performed: Procedure(s) (LRB): CYSTOSCOPY (N/A) [employer] RETRO-PUBIC MID URETHRAL SLING (N/A)  Anesthesia type: General  Patient location: PACU  Post pain: Pain level controlled  Post assessment: Post-op Vital signs reviewed  Last Vitals: BP 100/63  Pulse 91  Temp(Src) 36.6 C (Oral)  Resp 14  Wt 180 lb 8 oz (81.874 kg)  SpO2 97%  LMP 12/25/2012  Post vital signs: Reviewed  Level of consciousness: sedated  PACU course: Pt in afib rate 100-120 intermittently in OR. COnverted to sinus tach at 119. Metoprolol IV given in PACU, pt back into afib with rate 85-110. Discussed with patient that she would likely convert, but that she should contact her cardiologist if she persisted in this rhythm. She agreed and felt comfortable with discharge.  Complications: No apparent anesthesia complications

## 2014-08-04 NOTE — Op Note (Signed)
Preoperative diagnosis:  1. Stress urinary incontinence   Postoperative diagnosis:  1. As above   Procedure: 1. Retropubic mid-urethral sling 2. cystoscopy  Surgeon: Ardis Hughs, MD Resident Assistant: Dr. Amaryllis Dyke, MD  Anesthesia: General  Complications: None  Intraoperative findings: urethral hypermobility, small grade 1 cystocele  EBL: 200cc  Specimens: None  Indication: Miranda Taylor is a 53 y.o. patient with symptomatic stress urinary incontinence.  After reviewing the management options for treatment, he elected to proceed with the above surgical procedure(s). We have discussed the potential benefits and risks of the procedure, side effects of the proposed treatment, the likelihood of the patient achieving the goals of the procedure, and any potential problems that might occur during the procedure or recuperation. Informed consent has been obtained.  Description of procedure:  The patient was taken to the operating room and general anesthesia was induced.  The patient was placed in the dorsal lithotomy position, prepped and draped in the usual sterile fashion, and preoperative antibiotics were administered. A preoperative time-out was performed.   A 16 French Foley catheter was then placed in the patient's urethra and the bladder was drained. The Foley catheter was then capped. The horseshoe [place] retractor was then applied to the drape and the Foley catheter was attached to it. Using the skin hooks for skin nodes were placed in the corners of the labia minora to open up the vaginal vestibule. 5 cc of quarter percent Marcaine with 1% epinephrine was then injected into the periurethral tissue. The suprapubic incisions were then marked out 1 cm lateral to midline and 1 cm above the pubic bone. Using a 15 blade a stab incision was made in both sides of midline. Gauze is placed over these areas to control the skin bleeding. A 1.5 cm incision was then made in the mid  urethra through the vaginal mucosa. Using this Strooley scissors dissection was then carried out peri-urethrally lateral on both sides to the endopelvic fascia.  Once the dissection was completed and there was enough space to place a finger through the incision on both sides of the urethra. Using the AMS Spark needle set both needles were passed through the stab incisions suprapubically down posterior to the pubic bone and then through the periurethral incisions using the index finger to guide the needle out of the incision. Once both needles had been placed the Foley catheter was removed and cystoscopy was performed. A 70 lens was passed gently through the patient's urethra and into the bladder under visual guidance. A 360 cystoscopic evaluation was performed. There was no mucosal abnormalities or evidence of perforation from the needles.  The left needle indented the bladder and was repassed and then reinspected with the cystoscope. The cystoscope was then removed and the Foley catheter replaced. The bladder was then drained again and the Foley catheter. The ends of the sling were then attached to the needles and the needles pulled up through the retropubic space and out the suprapubic incision. Once the sling was noted to be centered the sling was cut at the ends and the plastic sheath was then pulled out. The mesh was noted to be well seated around the urethra - it was closer to the bladder neck than anticipated and I tried to readjust it to sit more distally. A right angle was used to ensure that the proper amount of tension for the sling around the urethra applied. The sling was noted to be tension free and the periurethral area  and well positioned. At this point copious amounts of double antibiotic irrigation was then used to irrigate the incision the periurethral space and the vagina. The incision was then closed with 2-0 Vicryl in a running fashion. The stab incisions in the suprapubic area were closed  with Dermabond. The vagina was then packed with clindamycin impregnated vaginal packing. The patient was subsequently extubated and returned to the PACU in stable condition.  Disposition: Vaginal packing will be removed in the PACU. The patient will be sent home with instructions to remove the Foley catheter in 24 hours. She will be given 3 days of antibiotics as well as pain medications. Followup has been scheduled for 2 weeks.

## 2014-08-04 NOTE — H&P (Signed)
Reason For Visit f/u after urodynamics   History of Present Illness 36F referred by Dr. Kathryne Eriksson for evaluation and management of a cystocele.  SUI: The patient predominant complaint today is urinary incontinence. He has both stress and urge incontinence. She is complaining more stress incontinence. She has leakage when she last, sneezes, bends over to pick things up, and exercises. She also has urge incontinence with mild leakage several times a week. She goes through approximately 3 urinary pads per day. They are moderately soaked when she changes them. He also complains of incomplete bladder emptying. She strains to get her stream started, she starts and stops midstream, and she has to posture to empty her bladder. She complains of a weak stream. In addition, the patient has a history of recurrent UTIs. She has had 3 UTIs in the last 6 months. These of treated with antibiotics and responded well. Her symptoms consist of back pain and suprapubic pain. She currently is symptomatic today. She also has a history of kidney stones, her last stone was approximately 1 year ago. As a child she also had urinary tract infections and had "bladder neck stretch". The patient denies any constipation. She does have dyspareunia at times. She denies any neurological symptoms, comorbidities, or deficits.    Interval: UDS showed stress urinary incontinence without instability, low leak point pressures. Continues to have stress incontinence, using ~3pds/day.    Past Medical History Problems  1. History of arthritis (V13.4) 2. History of atrial fibrillation (V12.59) 3. History of depression (V11.8) 4. History of heartburn (V12.79)  Surgical History Problems  1. History of Appendectomy 2. History of Back Surgery 3. History of Hand Surgery 4. History of Hand Surgery 5. History of Neck Surgery  Current Meds 1. Ativan 1 MG Oral Tablet;  Therapy: (Recorded:30Jun2015) to Recorded 2. Metoprolol Tartrate 25  MG Oral Tablet;  Therapy: [id number] to Recorded 3. OxyCODONE HCl - 15 MG Oral Tablet;  Therapy: (Recorded:30Jun2015) to Recorded 4. Sulfamethoxazole-TMP DS 800-160 MG Oral Tablet; TAKE 2 TABLETS EVERY 12  HOURS;  Therapy: [id number] to (Evaluate:02Jul2015)  Requested for: 30Jun2015; Last  Rx:30Jun2015 Ordered  Allergies Medication  1. No Known Drug Allergies  Family History Problems  1. Family history of Family Health Status - Father's Age   31yrs 2. Family history of Family Health Status - Mother's Age   58yrs 15. Family history of Family Health Status Number Of Children   2 sons and 2 daughters 48. No pertinent family history : Mother  Social History Problems  1. Denied: History of Alcohol Use 2. Caffeine Use   1- 1/2 QD 3. Disabled 4. Former smoker (V15.82)   quit 1990 5. Four children 6. Marital History - Currently Married 7. Separated from significant other (V61.03)  Vitals Vital Signs [Data Includes: Last 1 Day]  Recorded: [id number] 08:07AM  Blood Pressure: 111 / 72 Temperature: 97.7 F Heart Rate: 55  Physical Exam Constitutional: Well nourished and well developed . No acute distress.  Pulmonary: No respiratory distress, normal respiratory rhythm and effort and clear bilateral breath sounds.  Cardiovascular: Heart rate and rhythm are normal . The arterial pulses are normal. No peripheral edema.  Abdomen: The abdomen is soft and nontender.  Skin: Normal skin turgor, no visible rash and no visible skin lesions.  Neuro/Psych:. Mood and affect are appropriate.    Results/Data Urine [Data Includes: Last 1 Day]   [id number]  COLOR YELLOW   APPEARANCE CLEAR   SPECIFIC GRAVITY 1.025   pH 6.0  GLUCOSE NEG mg/dL  BILIRUBIN NEG   KETONE NEG mg/dL  BLOOD LARGE   PROTEIN NEG mg/dL  UROBILINOGEN 1 mg/dL  NITRITE NEG   LEUKOCYTE ESTERASE NEG   SQUAMOUS EPITHELIAL/HPF FEW   WBC 0-2 WBC/hpf  RBC 3-6 RBC/hpf  BACTERIA FEW   CRYSTALS NONE SEEN   CASTS NONE  SEEN    UDS:  Urodynamic evaluation, indication urinary incontinence  The patient's PVR was 30 mL's  Patient's maximum capacity was approximately 550 mL determined by bladder fullness.  Patient had normal desire to void around 400 mL's of the strong desire to void at 451 mL's the bladder was stable, no instability during the study.  The patient's cough leak point pressure at 100 mL's was 65 cm H2O  The patient's Valsalva leak point pressure at 200 mL's was 34 cm H2O  The patient's second Valsalva leak point pressure at 450 mL was 44 cm H2O with moderate leakage  The patient generated a strong contraction and voided 500 mL's minimal PVR.  The EMG moderately increased during voiding due to poor relaxation of the external sphincter    Impression: stress urinary incontinence with moderate leakage and low leak point pressures. There is no instability.   Assessment Patient has predominantly stress urinary incontinence with a low leak point pressure, her symptoms are very bothersome. She is eager to get the period she does not appear to have much bladder instability or overactivity.  Patient has microscopic hematuria today and her urinalysis without evidence of infection. He had a CT scan performed in 2014 that did show small kidney stone right.   Plan Health Maintenance  1. UA With REFLEX; [Do Not Release]; Status:Complete;   Done: [id number] 07:58AM Stress incontinence  2. Follow-up Schedule Surgery Office  Follow-up  Status: Hold For - Appointment   Requested for: [phone]  Discussion/Summary I went over the treatment options the patient for stress urinary incontinence. We discussed pelvic floor physical therapy and strengthening exercises as well as more invasive procedures such as urethral sling including both sympathetic signs and autologous fascial sling. The patient is here to have some significant improvement, based on her exam and her urodynamics I think the best treatment  choice would be a sling. I went over the risks and benefits of both autologous fascial sling as well as synthetic slings. I discussed the black box FDA warnings vaginal mesh. After learning about both options the patient has decided to proceed with a synthetic mesh sling. I discussed this operation with the patient detail including risks and benefits. She understands there is significant risk of urinary retention following the operation. She also understands that she may not be completely dry at the end of the case. Having been given all the risks and benefits the patient has agreed to proceed with the operation.

## 2014-08-04 NOTE — Interval H&P Note (Signed)
History and Physical Interval Note:  08/04/2014 7:29 AM  Miranda Taylor  has presented today for surgery, with the diagnosis of stress urinary incontinence  The various methods of treatment have been discussed with the patient and family. After consideration of risks, benefits and other options for treatment, the patient has consented to  Procedure(s): CYSTOSCOPY (N/A) [place] (N/A) as a surgical intervention .  The patient's history has been reviewed, patient examined, no change in status, stable for surgery.  I have reviewed the patient's chart and labs.  Questions were answered to the patient's satisfaction.     Louis Meckel W

## 2014-08-04 NOTE — Discharge Instructions (Signed)
Antibiotics You may be given a prescription for an antibiotic to take when you arrive home. If so, be sure to take every tablet in the bottle, even if you are feeling better before the prescription is finished. If you begin itching, notice a rash or start to swell on your trunk, arms, legs and/or throat, immediately stop taking the antibiotic and call your Urologist.  Removal of catheter You will go home with a catheter or tube in your bladder. You will remove this the day after surgery by cutting the tube that sticks off the end of the catheter (the balloon port with numbers written on it). You can do this in the shower and allow the catheter to fall out.  Ask [place] if you have any questions about the catheter management.  Remove the foley catheter after 24 hours ( day after the procedure).can be done easily by cutting the side port of the catheter, whichallow the balloon to deflate.  You will see 1-2 teaspoons of clear water as the balloon deflates and then the catheter can be slid out without difficulty.       Cut here   Diet: You may return to your normal diet within 24 hours following your surgery. You may note some mild nausea and possibly vomiting the first 6-8 hours following surgery. This is usually due to the side effects of anesthesia, and will disappear quite soon. I would suggest clear liquids and a very light meal the first evening following your surgery.  Activity:  Your physical activity is to be restricted, especially during the first 2 weeks home. During this time use the following guidelines: No lifting heavy objects (anything greater than 10 pounds). No driving a car and limit long car rides. No strenuous exercise, limits stairclimbing to a minimum.  Bowels:  You may need a stool softener and. A bowel movement every other day is reasonable. Use a mild laxative if needed, such as milk of magnesia 2-3 tablespoons, or 2 Dulcolax tablets. Call if you continue to have problems.  If you had been taking narcotics for pain, before, during or after your surgery, you may be constipated. Take a laxative if necessary.  Medication:  You should resume your pre-surgery medications unless told not to. In addition you may be given an antibiotic to prevent or treat infection. Antibiotics are not always necessary. Pain pills (Tylox or Vicodin) may also be given to help with the incision and catheter discomfort. Tylenol (acetaminophen) or Advil (ibuprofen) which have no narcotics Arbetter if the pain is not too bad. All medication should be taken as prescribed until the bottles are finished unless you are having an unusual reaction to one of the drugs.  Problems you should report to [place]:  a. Fever greater than 101F. b. Heavy bleeding, or clots (see notes above about blood in urine). c. Inability to urinate. d. Drug reactions (hives, rash, nausea, vomiting, diarrhea). e. Severe burning or pain with urination that is not improving.     Urethral Vaginal Sling, Care After Refer to this sheet in the next few weeks. These instructions provide you with information on caring for yourself after your procedure. Your health care provider may also give you more specific instructions. Your treatment has been planned according to current medical practices, but problems sometimes occur. Call your health care provider if you have any problems or questions after your procedure.  WHAT TO EXPECT AFTER THE PROCEDURE  After your procedure, it is typical to have the following:  A catheter in your bladder until your bladder is able to work on its own properly. You will be instructed on how to empty the catheter bag.  Absorbable stitches in your incisions. They will slowly dissolve over 1-2 months. HOME CARE INSTRUCTIONS  Get plenty of rest.  Only take over-the-counter or prescription medicines as directed by your health care provider. Do not take aspirin because it can cause bleeding.  Do not take  baths. Take showers until your health care provider tells you otherwise.  You may resume your usual diet. Eat a well-balanced diet.  Drink enough fluids to keep your urine clear or pale yellow.  Limit exercise and activities as directed by your health care provider. Do not lift anything heavier than 5 pounds (2.3 kg).  Do not douche, use tampons, or have sexual intercourse for 6 weeks after your procedure.  Follow up with your health care provider as directed. SEEK MEDICAL CARE IF:  You have a heavy or bad smelling vaginal discharge.   You have a rash.   You have pain that is not controlled with medicines.   You have lightheadedness or feel faint.  SEEK IMMEDIATE MEDICAL CARE IF:  You have a fever.   You have vaginal bleeding.   You faint.   You have shortness of breath.   You have chest, abdominal, or leg pain.   You have pain when urinating or cannot urinate.   Your catheter is still in your bladder and becomes blocked.   You have swelling, redness, and pain in the vaginal area.  Document Released: 08/14/2013 Document Reviewed: 08/14/2013 [facility] Patient Information 2015 [place], [place]. This information is not intended to replace advice given to you by your health care provider. Make sure you discuss any questions you have with your health care provider.      Post Anesthesia Home Care Instructions  Activity: Get plenty of rest for the remainder of the day. A responsible adult should stay with you for 24 hours following the procedure.  For the next 24 hours, DO NOT: -Drive a car -Paediatric nurse -Drink alcoholic beverages -Take any medication unless instructed by your physician -Make any legal decisions or sign important papers.  Meals: Start with liquid foods such as gelatin or soup. Progress to regular foods as tolerated. Avoid greasy, spicy, heavy foods. If nausea and/or vomiting occur, drink only clear liquids until the nausea and/or  vomiting subsides. Call your physician if vomiting continues.  Special Instructions/Symptoms: Your throat may feel dry or sore from the anesthesia or the breathing tube placed in your throat during surgery. If this causes discomfort, gargle with warm salt water. The discomfort should disappear within 24 hours.

## 2014-08-05 ENCOUNTER — Encounter (HOSPITAL_BASED_OUTPATIENT_CLINIC_OR_DEPARTMENT_OTHER): Payer: Self-pay | Admitting: Urology

## 2014-10-09 ENCOUNTER — Ambulatory Visit: Payer: Self-pay | Admitting: Podiatry

## 2014-10-13 ENCOUNTER — Ambulatory Visit: Payer: Medicare Other | Admitting: Podiatry

## 2014-10-13 ENCOUNTER — Ambulatory Visit: Payer: Self-pay | Admitting: Podiatry

## 2014-10-15 ENCOUNTER — Encounter: Payer: Self-pay | Admitting: Cardiology

## 2014-10-15 ENCOUNTER — Ambulatory Visit (INDEPENDENT_AMBULATORY_CARE_PROVIDER_SITE_OTHER): Payer: Medicare Other | Admitting: Cardiology

## 2014-10-15 VITALS — BP 110/69 | HR 56 | Ht 67.0 in | Wt 185.0 lb

## 2014-10-15 DIAGNOSIS — Z79899 Other long term (current) drug therapy: Secondary | ICD-10-CM

## 2014-10-15 DIAGNOSIS — E785 Hyperlipidemia, unspecified: Secondary | ICD-10-CM

## 2014-10-15 DIAGNOSIS — I480 Paroxysmal atrial fibrillation: Secondary | ICD-10-CM

## 2014-10-15 DIAGNOSIS — Z8249 Family history of ischemic heart disease and other diseases of the circulatory system: Secondary | ICD-10-CM

## 2014-10-15 LAB — COMPREHENSIVE METABOLIC PANEL
ALT: 11 U/L (ref 0–35)
AST: 14 U/L (ref 0–37)
Albumin: 3.9 g/dL (ref 3.5–5.2)
Alkaline Phosphatase: 136 U/L — ABNORMAL HIGH (ref 39–117)
BUN: 15 mg/dL (ref 6–23)
CO2: 30 meq/L (ref 19–32)
Calcium: 9.8 mg/dL (ref 8.4–10.5)
Chloride: 102 mEq/L (ref 96–112)
Creat: 0.76 mg/dL (ref 0.50–1.10)
Glucose, Bld: 88 mg/dL (ref 70–99)
Potassium: 4.4 mEq/L (ref 3.5–5.3)
SODIUM: 140 meq/L (ref 135–145)
TOTAL PROTEIN: 7.5 g/dL (ref 6.0–8.3)
Total Bilirubin: 0.7 mg/dL (ref 0.2–1.2)

## 2014-10-15 LAB — LIPID PANEL
Cholesterol: 180 mg/dL (ref 0–200)
HDL: 62 mg/dL (ref >39)
LDL Cholesterol: 106 mg/dL — ABNORMAL HIGH (ref 0–99)
Total CHOL/HDL Ratio: 2.9 Ratio
Triglycerides: 58 mg/dL (ref <150)
VLDL: 12 mg/dL (ref 0–40)

## 2014-10-15 NOTE — Assessment & Plan Note (Signed)
Paroxysmal atrial fibrillation documented on event monitoring with normal 2-D echo and Myoview stress test. She has a low CH ADS score on aspirin and flecainide as well as a beta blocker.  Stable today with an average of 2 episodes of a fib per month usually associated with stress or caffeine use.

## 2014-10-15 NOTE — Assessment & Plan Note (Signed)
Father recently died of MI, he has had CAD and her mother as well. Will check cholesterol for prevention.

## 2014-10-15 NOTE — Patient Instructions (Signed)
Your physician recommends that you return for lab work  Dr.Berry  wants you to follow-up in: [place]. You will receive a reminder letter in the mail two months in advance. If you don't receive a letter, please call our office to schedule the follow-up appointment.

## 2014-10-15 NOTE — Progress Notes (Signed)
10/15/2014   PCP: Tula Nakayama   Chief Complaint  Patient presents with  . Follow-up     pt denies chest pain, sob, and swelling    Primary Cardiologist:Dr. Adora Fridge EP: Dr. Beckie Salts   HPI:  53 year old mildly overweight married Caucasian female, mother of 82 and grandmother to 3 grandchildren, who was last seen by Dr. Adora Fridge in May 2015. She has a history of symptomatic paroxysmal atrial fibrillation with a low [place] score on beta-blocker and aspirin. She had a normal 2D echo in 2013 and stress test. She had an event monitor that showed multiple episodes of PAF, which in the past she is symptomatic from with complaints of shortness of breath and fatigue. She was referred to the electrophysiology cardiologist who placed her on flecaininde 50 mg PO BID. She was last seen by Dr. Lovena Le in August 2015 and no changes were made to her regimen at that time. She notes that since that time she has been having episodes of atrial fibrillation approximately twice a month. This is usually precipitated by stress or caffeine use. She is taking flecainide twice daily.  She is  taking her metoprolol bid as prescribed. She notes increased fatigue with exertion, but denies shortness of breath or chest pain.  She exercises on occasion.  She does stay active.    No chest pain.  She has chronic back pain that is stable.  She is doing well.  Her father died recently with an MI, her mother also has CAD.  She is not sure of her last lipid panel.    No Known Allergies  Current Outpatient Prescriptions  Medication Sig Dispense Refill  . baclofen (LIORESAL) 10 MG tablet Take 10 mg by mouth as needed.     . desvenlafaxine (PRISTIQ) 50 MG 24 hr tablet Take 50 mg by mouth every evening.    Marland Kitchen erythromycin with ethanol (THERAMYCIN) 2 % external solution as needed.    . flecainide (TAMBOCOR) 50 MG tablet Take 1 tablet (50 mg total) by mouth 2 (two) times daily. 180 tablet 3  . LORazepam (ATIVAN)  0.5 MG tablet Take 0.5 mg by mouth at bedtime as needed. For sleep    . metoprolol (LOPRESSOR) 50 MG tablet Take 1 tablet (50 mg total) by mouth 2 (two) times daily. 180 tablet 3  . montelukast (SINGULAIR) 10 MG tablet Take 10 mg by mouth as needed.     . Multiple Vitamin (MULITIVITAMIN WITH MINERALS) TABS Take 1 tablet by mouth daily.    Marland Kitchen NEXIUM 40 MG capsule Take 40 mg by mouth as needed.     Marland Kitchen oxyCODONE (ROXICODONE) 15 MG immediate release tablet Take 1 tablet (15 mg total) by mouth every 4 (four) hours as needed. For pain 30 tablet 0  . PRISTIQ 100 MG 24 hr tablet Take 100 mg by mouth daily.  11  . RESTASIS 0.05 % ophthalmic emulsion Place 1 drop into both eyes as needed.     . rizatriptan (MAXALT-MLT) 10 MG disintegrating tablet 10 mg as needed.      No current facility-administered medications for this visit.    Past Medical History  Diagnosis Date  . GERD (gastroesophageal reflux disease)   . Chronic lower back pain   . Paroxysmal atrial fibrillation   . Migraines   . Spinal stenosis   . SUI (stress urinary incontinence, female)   . History of kidney stones   . TMJ (temporomandibular  joint syndrome)     LEFT SIDE--  WEAR  MOUTH GUARD  . H/O hiatal hernia     Past Surgical History  Procedure Laterality Date  . Anterior cervical decomp/discectomy fusion  04-08-2010    C4 -- C5  . Posterior fusion lumbar spine  09-16-2009    L4 -- L5  . Appendectomy  1984  . Transthoracic echocardiogram  04-03-2012    normal LVF/  ef 55-60%  . Cardiovascular stress test  04-27-2014   dr berry    normal perfusion study/  no ischemia/  ef 61%  . Lasik  1990's  . Bilateral thunb joint arthrodesis  left 2007/   right 2010  . Negative sleep study  04-15-2013  . Cystoscopy N/A 08/04/2014    Procedure: CYSTOSCOPY;  Surgeon: Ardis Hughs, MD;  Location: [facility];  Service: Urology;  Laterality: N/A;  . Pubovaginal sling N/A 08/04/2014    Procedure: BOSTON SCIENTIFIC  RETRO-PUBIC MID URETHRAL SLING;  Surgeon: Ardis Hughs, MD;  Location: [facility];  Service: Urology;  Laterality: N/A;    GUR:KYHCWCB:JS colds or fevers, slow weight increase, she has had flu shot Skin:no rashes or ulcers HEENT:no blurred vision, no congestion CV:see HPI PUL:see HPI GI:no diarrhea constipation or melena, no indigestion GU:no hematuria, no dysuria MS:no joint pain, no claudication, chronic back pain stable Neuro:no syncope, no lightheadedness Endo:no diabetes, no thyroid disease  Wt Readings from Last 3 Encounters:  10/15/14 185 lb (83.915 kg)  08/04/14 180 lb 8 oz (81.874 kg)  07/26/14 175 lb (79.379 kg)    PHYSICAL EXAM BP 110/69 mmHg  Pulse 56  Ht 5\' 7"  (1.702 m)  Wt 185 lb (83.915 kg)  BMI 28.97 kg/m2  LMP 12/25/2012 General:Pleasant affect, NAD Skin:Warm and dry, brisk capillary refill HEENT:normocephalic, sclera clear, mucus membranes moist Neck:supple, no JVD, no bruits  Heart:S1S2 RRR without murmur, gallup, rub or click Lungs:clear without rales, rhonchi, or wheezes EGB:TDVV, non tender, + BS, do not palpate liver spleen or masses Ext:no lower ext edema, 2+ pedal pulses, 2+ radial pulses Neuro:alert and oriented, MAE, follows commands, + facial symmetry   ASSESSMENT AND PLAN Atrial fibrillation Paroxysmal atrial fibrillation documented on event monitoring with normal 2-D echo and Myoview stress test. She has a low CH ADS score on aspirin and flecainide as well as a beta blocker.  Stable today with an average of 2 episodes of a fib per month usually associated with stress or caffeine use.   Family history of early CAD Father recently died of MI, he has had CAD and her mother as well. Will check cholesterol for prevention.    Follow up with Dr. Adora Fridge in 6 months.

## 2014-11-03 ENCOUNTER — Ambulatory Visit (INDEPENDENT_AMBULATORY_CARE_PROVIDER_SITE_OTHER): Payer: Medicare Other | Admitting: Podiatry

## 2014-11-03 ENCOUNTER — Ambulatory Visit: Payer: Self-pay

## 2014-11-03 ENCOUNTER — Encounter: Payer: Self-pay | Admitting: Podiatry

## 2014-11-03 VITALS — BP 108/62 | HR 54 | Resp 16

## 2014-11-03 DIAGNOSIS — M722 Plantar fascial fibromatosis: Secondary | ICD-10-CM | POA: Diagnosis not present

## 2014-11-03 MED ORDER — TRIAMCINOLONE ACETONIDE 10 MG/ML IJ SUSP
10.0000 mg | Freq: Once | INTRAMUSCULAR | Status: AC
  Administered 2014-11-03: 10 mg

## 2014-11-03 NOTE — Patient Instructions (Signed)

## 2014-11-03 NOTE — Progress Notes (Signed)
Subjective:     Patient ID: Miranda Taylor, female   DOB: 1961/08/28, 53 y.o.   MRN: [id number]  HPI patient presents stating she's getting a lot of pain in the plantar aspect of her right heel and we did surgery on the left one 2 years ago which is doing very well   Review of Systems     Objective:   Physical Exam Neurovascular status intact with muscle strength adequate range of motion within normal limits. Patient's noted to have exquisite discomfort plantar aspect right heel at the insertional point of the tendon into the calcaneus with inflammation and fluid at the insertional point    Assessment:     Acute plantar fasciitis right insertional    Plan:     X-rays reviewed and H&P reviewed. Injected the plantar fascia right 3 mg Kenalog 5 mg Xylocaine and dispensed fascial brace with instructions on usage.

## 2014-11-10 ENCOUNTER — Ambulatory Visit (INDEPENDENT_AMBULATORY_CARE_PROVIDER_SITE_OTHER): Payer: Medicare Other | Admitting: Podiatry

## 2014-11-10 ENCOUNTER — Ambulatory Visit: Payer: Medicare Other | Admitting: Podiatry

## 2014-11-10 VITALS — BP 96/56 | HR 66 | Resp 16

## 2014-11-10 DIAGNOSIS — M722 Plantar fascial fibromatosis: Secondary | ICD-10-CM | POA: Diagnosis not present

## 2014-11-10 MED ORDER — TRIAMCINOLONE ACETONIDE 10 MG/ML IJ SUSP
10.0000 mg | Freq: Once | INTRAMUSCULAR | Status: AC
  Administered 2014-11-10: 10 mg

## 2014-11-10 NOTE — Progress Notes (Signed)
Subjective:     Patient ID: Miranda Taylor, female   DOB: 25-Feb-1961, 54 y.o.   MRN: [id number]  HPI patient states my heel is improved but still painful when pressed   Review of Systems     Objective:   Physical Exam Neurovascular status intact with exquisite discomfort plantar aspect right heel at the insertional point of the tendon into the calcaneus    Assessment:     Plantar fasciitis right with inflammation still noted    Plan:     Reinjected the plantar fascia right 3 mg Kenalog 5 mg Xylocaine advised on physical therapy and reappoint in 1 month. I am just concerned because on the left one week end up having to do surgery

## 2014-12-08 ENCOUNTER — Ambulatory Visit (INDEPENDENT_AMBULATORY_CARE_PROVIDER_SITE_OTHER): Payer: Medicare Other | Admitting: Podiatry

## 2014-12-08 ENCOUNTER — Encounter: Payer: Self-pay | Admitting: Podiatry

## 2014-12-08 VITALS — BP 109/61 | HR 57 | Resp 16

## 2014-12-08 DIAGNOSIS — M722 Plantar fascial fibromatosis: Secondary | ICD-10-CM

## 2014-12-08 MED ORDER — TRIAMCINOLONE ACETONIDE 10 MG/ML IJ SUSP
10.0000 mg | Freq: Once | INTRAMUSCULAR | Status: AC
  Administered 2014-12-08: 10 mg

## 2014-12-08 NOTE — Progress Notes (Signed)
Subjective:     Patient ID: Miranda Taylor, female   DOB: [birth year], 54 y.o.   MRN: [id number]  HPI patient states I have improved some on my right heel with pain still noted with ambulation. States that the center portion of the heel is now more symptomatic   Review of Systems     Objective:   Physical Exam Neurovascular status intact with muscle strength adequate and range of motion subtalar and midtarsal joint within normal limits. Patient is noted to have discomfort in the center portion of the right heel with inflammation and fluid buildup noted    Assessment:     Plantar fasciitis which has improved but is still present right    Plan:     Reviewed condition and from the lateral side injected 3 mg Kenalog 5 mg Xylocaine and instructed on physical therapy. Reappoint for [place] to recheck again in 4 weeks

## 2015-01-22 ENCOUNTER — Ambulatory Visit (INDEPENDENT_AMBULATORY_CARE_PROVIDER_SITE_OTHER): Payer: Medicare Other | Admitting: Podiatry

## 2015-01-22 DIAGNOSIS — M722 Plantar fascial fibromatosis: Secondary | ICD-10-CM

## 2015-01-22 NOTE — Progress Notes (Signed)
Subjective:     Patient ID: Miranda Taylor, female   DOB: 09/04/1961, 54 y.o.   MRN: [id number]  HPI patient states my right heel is killing me and something has to be done. States that she only had several days of relief from the previous treatment   Review of Systems     Objective:   Physical Exam Neurovascular status intact with muscle strength adequate range of motion within normal limits. Patient's noted to have severe medial pain at the plantar fascial insertion into the navicular with fluid buildup occurring around the area. Heel bone itself is not sore    Assessment:     Acute plantar fasciitis right that's not respond to numerous conservative care    Plan:     Reviewed condition and recommended surgery due to long-standing nature problem and the amount of discomfort she's had. At this time I allowed her to read a consent form reviewing alternative treatments and complications and the surgical procedure itself. I explained risk of surgery and the fact that with this particular procedure there can be pains that may last for an extensive period of time and recovery can take upwards of 1 year. She is willing to accept risk and signs consent form is given all preoperative instructions for surgery. She is scheduled for outpatient surgery [facility] but she surgical center at this time

## 2015-01-22 NOTE — Patient Instructions (Signed)
Pre-Operative Instructions  Congratulations, you have decided to take an important step to improving your quality of life.  You can be assured that the doctors of [facility] will be with you every step of the way.  1. Plan to be at the surgery center/hospital at least 1 (one) hour prior to your scheduled time unless otherwise directed by the surgical center/hospital staff.  You must have a responsible adult accompany you, remain during the surgery and drive you home.  Make sure you have directions to the surgical center/hospital and know how to get there on time. 2. For hospital based surgery you will need to obtain a history and physical form from your family physician within 1 month prior to the date of surgery- we will give you a form for you primary physician.  3. We make every effort to accommodate the date you request for surgery.  There are however, times where surgery dates or times have to be moved.  We will contact you as soon as possible if a change in schedule is required.   4. No Aspirin/Ibuprofen for one week before surgery.  If you are on aspirin, any non-steroidal anti-inflammatory medications (Mobic, Aleve, Ibuprofen) you should stop taking it 7 days prior to your surgery.  You make take Tylenol  For pain prior to surgery.  5. Medications- If you are taking daily heart and blood pressure medications, seizure, reflux, allergy, asthma, anxiety, pain or diabetes medications, make sure the surgery center/hospital is aware before the day of surgery so they may notify you which medications to take or avoid the day of surgery. 6. No food or drink after midnight the night before surgery unless directed otherwise by surgical center/hospital staff. 7. No alcoholic beverages 24 hours prior to surgery.  No smoking 24 hours prior to or 24 hours after surgery. 8. Wear loose pants or shorts- loose enough to fit over bandages, boots, and casts. 9. No slip on shoes, sneakers are best. 10. Bring  your boot with you to the surgery center/hospital.  Also bring crutches or a walker if your physician has prescribed it for you.  If you do not have this equipment, it will be provided for you after surgery. 11. If you have not been contracted by the surgery center/hospital by the day before your surgery, call to confirm the date and time of your surgery. 12. Leave-time from work may vary depending on the type of surgery you have.  Appropriate arrangements should be made prior to surgery with your employer. 13. Prescriptions will be provided immediately following surgery by your doctor.  Have these filled as soon as possible after surgery and take the medication as directed. 14. Remove nail polish on the operative foot. 15. Wash the night before surgery.  The night before surgery wash the foot and leg well with the antibacterial soap provided and water paying special attention to beneath the toenails and in between the toes.  Rinse thoroughly with water and dry well with a towel.  Perform this wash unless told not to do so by your physician.  Enclosed: 1 Ice pack (please put in freezer the night before surgery)   1 Hibiclens skin cleaner   Pre-op Instructions  If you have any questions regarding the instructions, do not hesitate to call our office.  [place]: [address] [place], [place] [address] [phone]  [place]: [address] [place], [place] [address] [phone]  [place]: [address]  [place], [place] [address] [phone]  Dr. Richard   Tuchman DPM, Dr. Norman Regal DPM Dr. Richard Sikora DPM, Dr. M. Todd Hyatt DPM, Dr. Kathryn Egerton DPM 

## 2015-02-03 ENCOUNTER — Encounter (HOSPITAL_COMMUNITY): Payer: Self-pay | Admitting: Emergency Medicine

## 2015-02-03 ENCOUNTER — Emergency Department (HOSPITAL_COMMUNITY): Payer: Medicare Other

## 2015-02-03 ENCOUNTER — Inpatient Hospital Stay (HOSPITAL_COMMUNITY)
Admission: EM | Admit: 2015-02-03 | Discharge: 2015-02-11 | DRG: 309 | Disposition: A | Discharge status: Home / Self Care | Payer: Medicare Other | Attending: Cardiovascular Disease | Admitting: Cardiovascular Disease

## 2015-02-03 DIAGNOSIS — K219 Gastro-esophageal reflux disease without esophagitis: Secondary | ICD-10-CM | POA: Diagnosis present

## 2015-02-03 DIAGNOSIS — M2660 Temporomandibular joint disorder, unspecified: Secondary | ICD-10-CM | POA: Diagnosis present

## 2015-02-03 DIAGNOSIS — R000 Tachycardia, unspecified: Secondary | ICD-10-CM

## 2015-02-03 DIAGNOSIS — M797 Fibromyalgia: Secondary | ICD-10-CM | POA: Diagnosis present

## 2015-02-03 DIAGNOSIS — I480 Paroxysmal atrial fibrillation: Secondary | ICD-10-CM

## 2015-02-03 DIAGNOSIS — Z87891 Personal history of nicotine dependence: Secondary | ICD-10-CM

## 2015-02-03 DIAGNOSIS — I4892 Unspecified atrial flutter: Principal | ICD-10-CM

## 2015-02-03 DIAGNOSIS — R739 Hyperglycemia, unspecified: Secondary | ICD-10-CM | POA: Insufficient documentation

## 2015-02-03 DIAGNOSIS — Z981 Arthrodesis status: Secondary | ICD-10-CM

## 2015-02-03 DIAGNOSIS — I513 Intracardiac thrombosis, not elsewhere classified: Secondary | ICD-10-CM | POA: Diagnosis present

## 2015-02-03 DIAGNOSIS — F331 Major depressive disorder, recurrent, moderate: Secondary | ICD-10-CM | POA: Diagnosis present

## 2015-02-03 DIAGNOSIS — Z79899 Other long term (current) drug therapy: Secondary | ICD-10-CM

## 2015-02-03 HISTORY — DX: Plantar fascial fibromatosis: M72.2

## 2015-02-03 LAB — MAGNESIUM: Magnesium: 1.6 mg/dL (ref 1.5–2.5)

## 2015-02-03 LAB — BASIC METABOLIC PANEL
ANION GAP: 4 — AB (ref 5–15)
BUN: 15 mg/dL (ref 6–23)
CHLORIDE: 109 mmol/L (ref 96–112)
CO2: 27 mmol/L (ref 19–32)
CREATININE: 0.84 mg/dL (ref 0.50–1.10)
Calcium: 8.7 mg/dL (ref 8.4–10.5)
GFR calc non Af Amer: 78 mL/min — ABNORMAL LOW (ref >90)
Glucose, Bld: 104 mg/dL — ABNORMAL HIGH (ref 70–99)
Potassium: 3.6 mmol/L (ref 3.5–5.1)
Sodium: 140 mmol/L (ref 135–145)

## 2015-02-03 LAB — CBC
HEMATOCRIT: 38.2 % (ref 36.0–46.0)
HEMOGLOBIN: 12.3 g/dL (ref 12.0–15.0)
MCH: 28.6 pg (ref 26.0–34.0)
MCHC: 32.2 g/dL (ref 30.0–36.0)
MCV: 88.8 fL (ref 78.0–100.0)
Platelets: 254 10*3/uL (ref 150–400)
RBC: 4.3 MIL/uL (ref 3.87–5.11)
RDW: 16 % — AB (ref 11.5–15.5)
WBC: 8.1 10*3/uL (ref 4.0–10.5)

## 2015-02-03 LAB — I-STAT TROPONIN, ED: TROPONIN I, POC: 0 ng/mL (ref 0.00–0.08)

## 2015-02-03 LAB — TROPONIN I: Troponin I: <0.03 ng/mL (ref <0.031)

## 2015-02-03 LAB — PROTIME-INR
INR: 0.99 (ref 0.00–1.49)
PROTHROMBIN TIME: 13.2 s (ref 11.6–15.2)

## 2015-02-03 LAB — BRAIN NATRIURETIC PEPTIDE: B Natriuretic Peptide: 366.7 pg/mL — ABNORMAL HIGH (ref 0.0–100.0)

## 2015-02-03 MED ORDER — PANTOPRAZOLE SODIUM 40 MG PO TBEC: 80.0000 mg | DELAYED_RELEASE_TABLET | Freq: Every day | ORAL | Status: DC | PRN

## 2015-02-03 MED ORDER — METOPROLOL TARTRATE 50 MG PO TABS
50.0000 mg | ORAL_TABLET | Freq: Two times a day (BID) | ORAL | Status: DC
  Filled 2015-02-03: qty 1

## 2015-02-03 MED ORDER — SODIUM CHLORIDE 0.9 % IV SOLN: 250.0000 mL | INTRAVENOUS | Status: DC | PRN

## 2015-02-03 MED ORDER — LORATADINE 10 MG PO TABS
10.0000 mg | ORAL_TABLET | Freq: Every day | ORAL | Status: DC | PRN
  Administered 2015-02-04 – 2015-02-05 (×2): 10 mg via ORAL
  Filled 2015-02-03 (×3): qty 1

## 2015-02-03 MED ORDER — ADULT MULTIVITAMIN W/MINERALS CH
1.0000 | ORAL_TABLET | Freq: Every day | ORAL | Status: DC
  Administered 2015-02-04 – 2015-02-11 (×8): 1 via ORAL
  Filled 2015-02-03 (×9): qty 1

## 2015-02-03 MED ORDER — ACETAMINOPHEN 325 MG PO TABS
650.0000 mg | ORAL_TABLET | ORAL | Status: DC | PRN
  Administered 2015-02-04 – 2015-02-05 (×2): 650 mg via ORAL
  Filled 2015-02-03 (×3): qty 2

## 2015-02-03 MED ORDER — DILTIAZEM LOAD VIA INFUSION
20.0000 mg | Freq: Once | INTRAVENOUS | Status: AC
  Administered 2015-02-03: 20 mg via INTRAVENOUS
  Filled 2015-02-03: qty 20

## 2015-02-03 MED ORDER — METOPROLOL TARTRATE 1 MG/ML IV SOLN
5.0000 mg | Freq: Once | INTRAVENOUS | Status: AC
  Administered 2015-02-03: 5 mg via INTRAVENOUS
  Filled 2015-02-03: qty 5

## 2015-02-03 MED ORDER — ONDANSETRON HCL 4 MG/2ML IJ SOLN: 4.0000 mg | Freq: Four times a day (QID) | INTRAMUSCULAR | Status: DC | PRN

## 2015-02-03 MED ORDER — SODIUM CHLORIDE 0.9 % IJ SOLN
3.0000 mL | Freq: Two times a day (BID) | INTRAMUSCULAR | Status: DC
  Administered 2015-02-03 – 2015-02-11 (×16): 3 mL via INTRAVENOUS

## 2015-02-03 MED ORDER — METOPROLOL TARTRATE 50 MG PO TABS
50.0000 mg | ORAL_TABLET | Freq: Four times a day (QID) | ORAL | Status: DC
Start: 2015-02-03 — End: 2015-02-08
  Administered 2015-02-03 – 2015-02-07 (×16): 50 mg via ORAL
  Filled 2015-02-03: qty 2
  Filled 2015-02-03 (×18): qty 1

## 2015-02-03 MED ORDER — LORAZEPAM 0.5 MG PO TABS
0.5000 mg | ORAL_TABLET | Freq: Every day | ORAL | Status: DC
  Administered 2015-02-03 – 2015-02-10 (×8): 0.5 mg via ORAL
  Filled 2015-02-03 (×8): qty 1

## 2015-02-03 MED ORDER — APIXABAN 5 MG PO TABS
5.0000 mg | ORAL_TABLET | Freq: Two times a day (BID) | ORAL | Status: DC
  Administered 2015-02-03 – 2015-02-11 (×16): 5 mg via ORAL
  Filled 2015-02-03 (×18): qty 1

## 2015-02-03 MED ORDER — ADENOSINE 6 MG/2ML IV SOLN
6.0000 mg | Freq: Once | INTRAVENOUS | Status: AC
  Administered 2015-02-03: 6 mg via INTRAVENOUS
  Filled 2015-02-03: qty 2

## 2015-02-03 MED ORDER — DEXTROSE 5 % IV SOLN
5.0000 mg/h | INTRAVENOUS | Status: DC
  Administered 2015-02-03: 5 mg/h via INTRAVENOUS
  Filled 2015-02-03: qty 100

## 2015-02-03 MED ORDER — VENLAFAXINE HCL ER 75 MG PO CP24
75.0000 mg | ORAL_CAPSULE | Freq: Every evening | ORAL | Status: DC
  Administered 2015-02-03 – 2015-02-10 (×8): 75 mg via ORAL
  Filled 2015-02-03 (×9): qty 1

## 2015-02-03 MED ORDER — OXYCODONE HCL 5 MG PO TABS
7.5000 mg | ORAL_TABLET | ORAL | Status: DC | PRN
Start: 2015-02-03 — End: 2015-02-11
  Administered 2015-02-06: 10 mg via ORAL
  Administered 2015-02-06 – 2015-02-07 (×2): 15 mg via ORAL
  Administered 2015-02-07 – 2015-02-11 (×8): 10 mg via ORAL
  Filled 2015-02-03 (×3): qty 2
  Filled 2015-02-03 (×2): qty 3
  Filled 2015-02-03: qty 2
  Filled 2015-02-03: qty 3
  Filled 2015-02-03 (×4): qty 2

## 2015-02-03 MED ORDER — SODIUM CHLORIDE 0.9 % IJ SOLN: 3.0000 mL | INTRAMUSCULAR | Status: DC | PRN

## 2015-02-03 NOTE — Progress Notes (Signed)
Discussed risks, benefits, indications of anticoagulation with patient. She prefers Eliquis out of the options discussed. Father had bad experience on Xarelto.  Just as a reminder note when discharging patient we'll have to keep in mind that her sinus rate is in the 50s. This may affect the regimen she goes out on. Dayna Dunn PA-C

## 2015-02-03 NOTE — ED Notes (Signed)
Pt coming from MD office. Pt having weakness/fatigue- diagnosed with sinus infection. Pt has hx of Afib but normally in SR., today HR 150's Aflutter at MD office and sent here. BP 140/90, HR 148

## 2015-02-03 NOTE — ED Notes (Signed)
Placed pt on zoll for adenosine. PA at bedside. Will administer adenosine

## 2015-02-03 NOTE — ED Provider Notes (Signed)
CSN: [id number]     Arrival date & time 02/03/15  1356 History   First MD Initiated Contact with Patient 02/03/15 1357     Chief Complaint  Patient presents with  . Atrial Flutter     (Consider location/radiation/quality/duration/timing/severity/associated sxs/prior Treatment) HPI  Miranda Taylor is a 54 y.o. female with PMH of proximal atrial fibrillation takes flecanide occasionally, lopressor daily presenting from primary office due to atrial flutter with heart rate 150s. Patient went for a sinus infection. She is not in any anticoagulant. She denies any chest pain or shortness of breath. Patient given 324 aspirin in the ED. Patient endorses generalized weakness and fatigue for one week but no other symptoms. No peripheral edema or gain. Any alcohol or caffeine use. She is followed by Dr. Alvester Chou   Past Medical History  Diagnosis Date  . GERD (gastroesophageal reflux disease)   . Chronic lower back pain   . Paroxysmal atrial fibrillation   . Migraines   . Spinal stenosis   . SUI (stress urinary incontinence, female)   . History of kidney stones   . TMJ (temporomandibular joint syndrome)     LEFT SIDE--  WEAR  MOUTH GUARD  . H/O hiatal hernia    Past Surgical History  Procedure Laterality Date  . Anterior cervical decomp/discectomy fusion  04-08-2010    C4 -- C5  . Posterior fusion lumbar spine  09-16-2009    L4 -- L5  . Appendectomy  1984  . Transthoracic echocardiogram  04-03-2012    normal LVF/  ef 55-60%  . Cardiovascular stress test  04-27-2014   dr berry    normal perfusion study/  no ischemia/  ef 61%  . Lasik  1990's  . Bilateral thunb joint arthrodesis  left 2007/   right 2010  . Negative sleep study  04-15-2013  . Cystoscopy N/A 08/04/2014    Procedure: CYSTOSCOPY;  Surgeon: Ardis Hughs, MD;  Location: [facility];  Service: Urology;  Laterality: N/A;  . Pubovaginal sling N/A 08/04/2014    Procedure: BOSTON SCIENTIFIC RETRO-PUBIC MID URETHRAL  SLING;  Surgeon: Ardis Hughs, MD;  Location: [facility];  Service: Urology;  Laterality: N/A;   Family History  Problem Relation Age of Onset  . Hypertension    . Heart attack Father   . Heart disease Mother    History  Substance Use Topics  . Smoking status: Former Smoker -- 0.50 packs/day for 20 years    Types: Cigarettes    Quit date: 11/07/2001  . Smokeless tobacco: Never Used  . Alcohol Use: No   OB History    No data available     Review of Systems 10 Systems reviewed and are negative for acute change except as noted in the HPI.    Allergies  Review of patient's allergies indicates no known allergies.  Home Medications   Prior to Admission medications   Medication Sig Start Date End Date Taking? Authorizing Provider  desvenlafaxine (PRISTIQ) 50 MG 24 hr tablet Take 50 mg by mouth every evening.   Yes Historical Provider, MD  flecainide (TAMBOCOR) 50 MG tablet Take 1 tablet (50 mg total) by mouth 2 (two) times daily. Patient taking differently: Take 50 mg by mouth as needed (for heart rate).  06/25/14  Yes Evans Lance, MD  LORazepam (ATIVAN) 0.5 MG tablet Take 0.5 mg by mouth at bedtime. For sleep   Yes Historical Provider, MD  metoprolol (LOPRESSOR) 50 MG tablet Take 1  tablet (50 mg total) by mouth 2 (two) times daily. 06/25/14  Yes Evans Lance, MD  Multiple Vitamin (MULITIVITAMIN WITH MINERALS) TABS Take 1 tablet by mouth daily.   Yes Historical Provider, MD  NEXIUM 40 MG capsule Take 40 mg by mouth as needed (for heartburn).  06/25/12  Yes Historical Provider, MD  oxyCODONE (ROXICODONE) 15 MG immediate release tablet Take 1 tablet (15 mg total) by mouth every 4 (four) hours as needed. For pain Patient taking differently: Take 7.5-15 mg by mouth every 4 (four) hours as needed for pain. For pain 08/04/14  Yes Ardis Hughs, MD  rizatriptan (MAXALT-MLT) 10 MG disintegrating tablet Take 10 mg by mouth as needed for migraine.  02/26/13  Yes  Historical Provider, MD   BP 114/98 mmHg  Pulse 146  Temp(Src) 98.2 F (36.8 C) (Oral)  Resp 16  Ht 5\' 7"  (1.702 m)  Wt 193 lb (87.544 kg)  BMI 30.22 kg/m2  SpO2 96%  LMP 12/25/2012 Physical Exam  Constitutional: She appears well-developed and well-nourished. No distress.  HENT:  Head: Normocephalic and atraumatic.  Eyes: Conjunctivae and EOM are normal. Right eye exhibits no discharge. Left eye exhibits no discharge.  Neck: No JVD present.  Cardiovascular: Regular rhythm.   Tachycardia. No leg swelling or tenderness. Negative Homan's sign.  Pulmonary/Chest: Effort normal and breath sounds normal. No respiratory distress. She has no wheezes.  Abdominal: Soft. Bowel sounds are normal. She exhibits no distension. There is no tenderness.  Neurological: She is alert. She exhibits normal muscle tone. Coordination normal.  Skin: Skin is warm and dry. She is not diaphoretic.  Nursing note and vitals reviewed.   ED Course  Procedures (including critical care time) Labs Review Labs Reviewed  CBC - Abnormal; Notable for the following:    RDW 16.0 (*)    All other components within normal limits  BASIC METABOLIC PANEL - Abnormal; Notable for the following:    Glucose, Bld 104 (*)    GFR calc non Af Amer 78 (*)    Anion gap 4 (*)    All other components within normal limits  I-STAT TROPOININ, ED    Imaging Review Dg Chest 2 View  02/03/2015   CLINICAL DATA:  Weakness and fatigue. Sinus infection. Patient has atrial flutter with heart rate 150.  EXAM: CHEST  2 VIEW  COMPARISON:  04/02/2012.  FINDINGS: Cardiac silhouette within normal limits. Mild vascular congestion could represent early failure or bronchitis. Trace BILATERAL pleural effusions. Normal mediastinal contour. No osseous findings.  IMPRESSION: Normal heart size with mild vascular congestion could represent early failure or bronchitis. Trace BILATERAL pleural effusions. Worsening aeration from priors.   Electronically Signed    By: Rolla Flatten M.D.   On: 02/03/2015 15:27     EKG Interpretation   Date/Time:  Tuesday February 03 2015 14:02:00 EDT Ventricular Rate:  148 PR Interval:    QRS Duration: 96 QT Interval:  349 QTC Calculation: 548 R Axis:   -43 Text Interpretation:  Sinus tachycardia Left axis deviation RSR' in V1 or  V2, probably normal variant Abnormal T, consider ischemia, diffuse leads  Prolonged QT interval Baseline wander in lead(s) II III aVR aVF V1  Confirmed by ZAMMIT  MD, JOSEPH [phone]) on 02/03/2015 4:02:02 PM      MDM   Final diagnoses:  Atrial flutter  Tachycardia   Pt with PMH of paroxsymal atrial fibrillation presenting from PCP with atrial flutter and HR 150. No chest pain or SOB but  patient reports generalized weakness for one week. Patient with tachycardia but no JVD or peripheral edema. No improvement valsalva, Trendelenburg. Patient given IV Lopressor without improvement of heart rate. Patient also given adenosine which decreased rate to 90 and patient rhythm. BP atrial flutter but rate returned to 150. Consult to cardiology. Spoke with Almyra Deforest PA-C with cardiology who agrees to evaluate the patient with plan for admission to cardiology service.  Patient signed out to [facility] PA-C at shift change pending cardiology's recommendations.  Discussed all results and patient verbalizes understanding and agrees with plan.  This is a shared patient. This patient was discussed with the physician who saw and evaluated the patient and agrees with the plan.     Al Corpus, PA-C 02/03/15 1647  Milton Ferguson, MD 02/04/15 [phone]

## 2015-02-03 NOTE — ED Provider Notes (Signed)
Discussed case with Al Corpus, PA-C. Transfer of care to this provider a change in shift.  Miranda Taylor is a 54 year old female with past medical history of proximal atrial fibrillation that takes the night occasionally, daily Lopressor presents to the primary care provider's office with atrial flutter with a heart rate in the 150s. Patient given 324 mg aspirin in ED setting.  Plan: Cardiology to come and assess patient. High anticipation that the patient will be admitted.   Patient seen and assessed by cardiology. Cardiology to admit patient. Admission orders placed by cardiology.  Jamse Mead, PA-C 02/03/15 [place], MD 02/04/15 [phone]

## 2015-02-03 NOTE — ED Notes (Signed)
Report from hope, rn.  Pt care assumed

## 2015-02-03 NOTE — ED Notes (Signed)
Pt's HR momentarily went to 90, then right back to 150 after the adenosine.

## 2015-02-03 NOTE — ED Notes (Signed)
MD office gave pt 324mg  ASA. Pt denies chest pain and SOB

## 2015-02-03 NOTE — H&P (Signed)
History and Physical  Patient ID: Miranda Taylor MRN: [id number], DOB: 12-06-1960 Date of Encounter: 02/03/2015, 4:59 PM Primary Physician: Tula Nakayama Primary Cardiologist: Dr. Gwenlyn Found Primary EP: Dr. Lovena Le  Chief Complaint: fatigue Reason for Admission: atrial flutter with RVR  HPI: [name] is a 54 y/o F with history of paroxysmal atrial fibrillation (maintained on BB/flecainide, not on anticoagulation due to historically low [place] score - CHADSVASC 1, migraines, unremarkable echo/nuc in 2013, occasional migraines, and sinus bradycardia when in normal rhythm who presented to [facility] today from PCP for evaluation of atrial flutter. She was previously on BID flecainide but now only takes it rarely PRN.  She has felt fatigued for the last week. She saw her PCP this morning because she thought she might be coming down with a sinus infection due to the fatigue. She has had decreased exercise tolerance lately due to general fatigue. She also has had mild sore throat and runny nose. She denies fever, chills, CP or SOB. She thought she felt a little bit of palpitations this morning so she took her metoprolol and they resolved. However, when seen by her PCP she was noted to be in atrial flutter with RVR with HR 150. She had no awareness of this arrhythmia at all. In the ER she was given IV Lopressor without improvement in HR. She was also given adenosine which decreased rate to 90 but HR quickly returned to 150bpm (not on central tele at that time). Labs pertinent for troponin neg x1, CBC OK, BMET normal except glu 104. CXR normal heart size with mild vascular congestion could represent early failure or bronchitis; trace bilateral pleural effusions. HR still 150. BP stable at 110-120s. Note HR when in NSR 49-55 on previous EKGs. She is minimally symptomatic with only general fatigue. No chest pain, dyspnea, LEE, orthopnea, PND, cough, history of bleeding, history of stroke. She reports tick  bite earlier this month and states PCP has drawn blood to further evaluate.  Past Medical History  Diagnosis Date  . GERD (gastroesophageal reflux disease)   . Chronic lower back pain   . Paroxysmal atrial fibrillation   . Migraines   . Spinal stenosis   . SUI (stress urinary incontinence, female)   . History of kidney stones   . TMJ (temporomandibular joint syndrome)     LEFT SIDE--  WEAR  MOUTH GUARD  . H/O hiatal hernia   . Plantar fasciitis      Most Recent Cardiac Studies: 2D Echo 03/2012 - Left ventricle: The cavity size was normal. Systolic function was normal. The estimated ejection fraction was in the range of 55% to 60%. Wall motion was normal; there were no regional wall motion abnormalities. - Atrial septum: No defect or patent foramen ovale was identified.  Nuclear stress test 04/2012 - Normal, low risk, EF 61%   Surgical History:  Past Surgical History  Procedure Laterality Date  . Anterior cervical decomp/discectomy fusion  04-08-2010    C4 -- C5  . Posterior fusion lumbar spine  09-16-2009    L4 -- L5  . Appendectomy  1984  . Transthoracic echocardiogram  04-03-2012    normal LVF/  ef 55-60%  . Cardiovascular stress test  04-27-2014   dr berry    normal perfusion study/  no ischemia/  ef 61%  . Lasik  1990's  . Bilateral thunb joint arthrodesis  left 2007/   right 2010  . Negative sleep study  04-15-2013  . Cystoscopy N/A 08/04/2014  Procedure: CYSTOSCOPY;  Surgeon: Ardis Hughs, MD;  Location: [facility];  Service: Urology;  Laterality: N/A;  . Pubovaginal sling N/A 08/04/2014    Procedure: BOSTON SCIENTIFIC RETRO-PUBIC MID URETHRAL SLING;  Surgeon: Ardis Hughs, MD;  Location: [facility];  Service: Urology;  Laterality: N/A;     Home Meds: Prior to Admission medications   Medication Sig Start Date End Date Taking? Authorizing Provider  desvenlafaxine (PRISTIQ) 50 MG 24 hr tablet Take 50 mg by mouth every  evening.   Yes Historical Provider, MD  flecainide (TAMBOCOR) 50 MG tablet Take 1 tablet (50 mg total) by mouth 2 (two) times daily. Patient taking differently: Take 50 mg by mouth as needed (for heart rate).  06/25/14  Yes Evans Lance, MD  LORazepam (ATIVAN) 0.5 MG tablet Take 0.5 mg by mouth at bedtime. For sleep   Yes Historical Provider, MD  metoprolol (LOPRESSOR) 50 MG tablet Take 1 tablet (50 mg total) by mouth 2 (two) times daily. 06/25/14  Yes Evans Lance, MD  Multiple Vitamin (MULITIVITAMIN WITH MINERALS) TABS Take 1 tablet by mouth daily.   Yes Historical Provider, MD  NEXIUM 40 MG capsule Take 40 mg by mouth as needed (for heartburn).  06/25/12  Yes Historical Provider, MD  oxyCODONE (ROXICODONE) 15 MG immediate release tablet Take 1 tablet (15 mg total) by mouth every 4 (four) hours as needed. For pain Patient taking differently: Take 7.5-15 mg by mouth every 4 (four) hours as needed for pain. For pain 08/04/14  Yes Ardis Hughs, MD  rizatriptan (MAXALT-MLT) 10 MG disintegrating tablet Take 10 mg by mouth as needed for migraine.  02/26/13  Yes Historical Provider, MD    Allergies: No Known Allergies  History   Social History  . Marital Status: Legally Separated    Spouse Name: N/A  . Number of Children: N/A  . Years of Education: N/A   Occupational History  . Disabled    Social History Main Topics  . Smoking status: Former Smoker -- 0.50 packs/day for 20 years    Types: Cigarettes    Quit date: 11/07/2001  . Smokeless tobacco: Never Used  . Alcohol Use: No  . Drug Use: No  . Sexual Activity: Not on file   Other Topics Concern  . Not on file   Social History Narrative   Pt lives with husband in [place].  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     Family History  Problem Relation Age of Onset  . Hypertension    . Heart attack Father   . Heart disease Mother     Review of Systems: All other systems  reviewed and are otherwise negative except as noted above.  Labs:   Lab Results  Component Value Date   WBC 8.1 02/03/2015   HGB 12.3 02/03/2015   HCT 38.2 02/03/2015   MCV 88.8 02/03/2015   PLT 254 02/03/2015    Recent Labs Lab 02/03/15 1410  NA 140  K 3.6  CL 109  CO2 27  BUN 15  CREATININE 0.84  CALCIUM 8.7  GLUCOSE 104*    Lab Results  Component Value Date   CHOL 180 10/15/2014   HDL 62 10/15/2014   LDLCALC 106* 10/15/2014   TRIG 58 10/15/2014   Lab Results  Component Value Date   DDIMER 0.63* 04/02/2012    Radiology/Studies:  Dg Chest 2 View  02/03/2015   CLINICAL DATA:  Weakness and fatigue. Sinus infection. Patient has atrial flutter with heart rate 150.  EXAM: CHEST  2 VIEW  COMPARISON:  04/02/2012.  FINDINGS: Cardiac silhouette within normal limits. Mild vascular congestion could represent early failure or bronchitis. Trace BILATERAL pleural effusions. Normal mediastinal contour. No osseous findings.  IMPRESSION: Normal heart size with mild vascular congestion could represent early failure or bronchitis. Trace BILATERAL pleural effusions. Worsening aeration from priors.   Electronically Signed   By: Rolla Flatten M.D.   On: 02/03/2015 15:27   Wt Readings from Last 3 Encounters:  02/03/15 193 lb (87.544 kg)  10/15/14 185 lb (83.915 kg)  08/04/14 180 lb 8 oz (81.874 kg)   EKG: atrial flutter 148bpm diffuse T wave changes   Physical Exam: Blood pressure 110/87, pulse 147, temperature 98.2 F (36.8 C), temperature source Oral, resp. rate 16, height 5\' 7"  (1.702 m), weight 193 lb (87.544 kg), last menstrual period 12/25/2012, SpO2 96 %. General: Well developed, well nourished WF, in no acute distress. Head: Normocephalic, atraumatic, sclera non-icteric, no xanthomas, nares are without discharge.  Neck: Negative for carotid bruits. JVD not elevated. Lungs: Clear bilaterally to auscultation without wheezes, rales, or rhonchi. Breathing is unlabored. Heart: RRR  with tachycardic rate with S1 S2. No murmurs, rubs, or gallops appreciated. Abdomen: Soft, non-tender, non-distended with normoactive bowel sounds. No hepatomegaly. No rebound/guarding. No obvious abdominal masses. Msk:  Strength and tone appear normal for age. Extremities: No clubbing or cyanosis. No edema.  Distal pedal pulses are 2+ and equal bilaterally. Neuro: Alert and oriented X 3. No focal deficit. No facial asymmetry. Moves all extremities spontaneously. Psych:  Responds to questions appropriately with a normal affect.    ASSESSMENT AND PLAN:   1. Newly recognized atrial flutter with RVR of unclear duration - she reports fatigue x 1 week, which is currently the other symptom she feels right now at a HR of 150. Per d/w MD, will start diltiazem with 20mg  bolus then drip. Although rate control can be difficult to achieve with atrial flutter, will start with this strategy. Continue home BB. If HR is better in the AM, would plan for 3-4 weeks of anticoagulation with outpatient DCCV. If she remains in rapid atrial flutter will need to consider TEE/DCCV this admission. Will start Xarelto 20mg  qsupper in anticipation of this - will discuss with MD how many doses would be necessary before performing DCCV. CHADSVASC tentatively 1 for female only but will check A1C given hyperglycemia. Check BNP given question of effusions on CXR. Check 2D echo when HR lower. At discharge may consider changing flecainide back to 50mg  BID scheduled rather than PRN as she has been doing. 2. History of paroxysmal atrial fibrillation - has had to use flecainide only sparingly. 3. Recent rhinitis symptoms with history of seasonal allergies - mild. Exam generally benign. Supportive care. Symptomatic rx. 4. Mild hyperglycemia - check A1C.  5. Recent tick bite - being evaluated by primary care, tickborne titers have been obtained per patient.  Signed, Melina Copa PA-C 02/03/2015, 4:59 PM   Patient seen and examined. Agree  with assessment and plan.  [name] is a pleasant 54 year old female who has a history of paroxysmal atrial fibrillation and has been on metoprolo tartrate 50 mg twice a day , and remotely had been on Flecanide.  She has not been on anticoagulation due to a low chads asked on score.  She has a history of migraine headaches.  Over the past week she has noticed more fatigue  and developments of some mild exertional shortness of breath.  She has experienced sinus irritation and recently experienced a tick bite.  She presented to her primary care's office today for evaluation and was noted to have tachycardic heart rate in atrial flutter at a rate of 150 bpm.  She was transported to the emergency room and was given 5 mg IV Lopressor without significant benefit.  She had been given adenosine with transient reduction of heart rate, but ultimately she reverted back to her present rate at 148 bpm.  She is unaware of her palpitations.  She denies associated chest pain.  She denies any over-the-counter pseudoephedrine preparations.  There is no excessive caffeine use.  Her ECG showed probable atrial flutter with 21 block at 148 bpm. .  At present, will start Cardizem 20 mg bolus and initiate a 10 mg per hour drip.  She will be started on anticoagulation.  Consider TEE guided cardioversion if she does not convert to sinus rhythm and she may require re-initiation of flecainide or another antiarrhythmic regimen.  Troy Sine, MD, [facility] 02/03/2015 6:48 PM

## 2015-02-03 NOTE — ED Notes (Signed)
Pt monitored by pulse ox, bp cuff, and 12-lead. 

## 2015-02-03 NOTE — Progress Notes (Signed)
On-Call Cardiology Note  S: I was contacted by the patient's RN regarding a skin reaction at the site of diltiazem administration.  Patient had just received IV bolus when she developed stinging in the left forearm and multiple adjacent hives with erythema.  The redness and pain have resolved, but the hives persist.  The IV flushes freely without pain with saline.  The patient denies chest pain, shortness of breath, wheezing and rash elsewhere.  She continues to have palpitations.  O: Temp:  [98.2 F (36.8 C)] 98.2 F (36.8 C) (03/29 1402) Pulse Rate:  [146-150] 150 (03/29 2050) Resp:  [15-22] 18 (03/29 2050) BP: (110-136)/(82-106) 135/105 mmHg (03/29 2050) SpO2:  [91 %-100 %] 91 % (03/29 2050) Weight:  [83.915 kg (185 lb)-87.544 kg (193 lb)] 87.544 kg (193 lb) (03/29 1620)  Gen: Lying in bed, appears slightly anxious. HEENT: MMM.  No facial edema. Resp: Mildly diminshed breath sounds at both bases.  Otherwise clear without wheezes or crackles. CV: Tachycardic but regular.  No murmurs. Ext: Left forearm with 3 wheals.  No erythema or evidence of IV infiltration.  No rashes elsewhere on the body.  Tele: Atrial flutter with 2:1 block.  Ventricular rate 148 bpm.  A/P: 54 y/o woman with h/o PAF admitted for atrial flutter with 2:1 block.  She has a local skin reaction concerning for allergic reaction to the diltiazem.  Though she does not have any systemic symptoms, I am hesitant to continue with this medication.  I will therefore increase her metoprolol to 50 mg Q6 hours (first dose now) with consideration for addition of verapamil if additional rate control is needed and blood pressure allows.  Ultimately, the patient my need TEE/DCCV in the AM.  I will therefore make her NPO after midnight.  Nelva Bush, MD Cardiology Fellow

## 2015-02-03 NOTE — Progress Notes (Signed)
Pt c/o stinging on left hand iv after bolus of cardizem.    Left arm had patches of redness and whelps. Stopped Cardizem and Md called.  No other symptoms noted.  Hr remains in 140's .  Will continue to monitor. Saunders Revel T

## 2015-02-04 DIAGNOSIS — I480 Paroxysmal atrial fibrillation: Secondary | ICD-10-CM | POA: Diagnosis not present

## 2015-02-04 DIAGNOSIS — I4892 Unspecified atrial flutter: Secondary | ICD-10-CM | POA: Diagnosis not present

## 2015-02-04 LAB — COMPREHENSIVE METABOLIC PANEL
ALT: 51 U/L — ABNORMAL HIGH (ref 0–35)
AST: 46 U/L — AB (ref 0–37)
Albumin: 2.8 g/dL — ABNORMAL LOW (ref 3.5–5.2)
Alkaline Phosphatase: 92 U/L (ref 39–117)
Anion gap: 3 — ABNORMAL LOW (ref 5–15)
BILIRUBIN TOTAL: 0.5 mg/dL (ref 0.3–1.2)
BUN: 15 mg/dL (ref 6–23)
CALCIUM: 8.9 mg/dL (ref 8.4–10.5)
CO2: 31 mmol/L (ref 19–32)
Chloride: 108 mmol/L (ref 96–112)
Creatinine, Ser: 0.99 mg/dL (ref 0.50–1.10)
GFR calc non Af Amer: 64 mL/min — ABNORMAL LOW (ref >90)
GFR, EST AFRICAN AMERICAN: 74 mL/min — AB (ref >90)
Glucose, Bld: 116 mg/dL — ABNORMAL HIGH (ref 70–99)
POTASSIUM: 3.7 mmol/L (ref 3.5–5.1)
SODIUM: 142 mmol/L (ref 135–145)
Total Protein: 6.2 g/dL (ref 6.0–8.3)

## 2015-02-04 LAB — CBC
HCT: 37.9 % (ref 36.0–46.0)
Hemoglobin: 11.9 g/dL — ABNORMAL LOW (ref 12.0–15.0)
MCH: 28 pg (ref 26.0–34.0)
MCHC: 31.4 g/dL (ref 30.0–36.0)
MCV: 89.2 fL (ref 78.0–100.0)
Platelets: 248 10*3/uL (ref 150–400)
RBC: 4.25 MIL/uL (ref 3.87–5.11)
RDW: 16.3 % — AB (ref 11.5–15.5)
WBC: 8.5 10*3/uL (ref 4.0–10.5)

## 2015-02-04 LAB — MRSA PCR SCREENING: MRSA by PCR: NEGATIVE

## 2015-02-04 LAB — T4, FREE: FREE T4: 0.96 ng/dL (ref 0.80–1.80)

## 2015-02-04 LAB — TROPONIN I
Troponin I: <0.03 ng/mL (ref <0.031)
Troponin I: <0.03 ng/mL (ref <0.031)

## 2015-02-04 LAB — TSH: TSH: 3.081 u[IU]/mL (ref 0.350–4.500)

## 2015-02-04 MED ORDER — ZOLPIDEM TARTRATE 5 MG PO TABS
5.0000 mg | ORAL_TABLET | Freq: Every evening | ORAL | Status: DC | PRN
  Administered 2015-02-04 – 2015-02-10 (×5): 5 mg via ORAL
  Filled 2015-02-04 (×5): qty 1

## 2015-02-04 MED ORDER — ZOLPIDEM TARTRATE 5 MG PO TABS: 5.0000 mg | ORAL_TABLET | Freq: Every day | ORAL | Status: DC

## 2015-02-04 MED ORDER — MAGNESIUM SULFATE 2 GM/50ML IV SOLN
2.0000 g | Freq: Once | INTRAVENOUS | Status: AC
  Administered 2015-02-04: 2 g via INTRAVENOUS
  Filled 2015-02-04: qty 50

## 2015-02-04 NOTE — Progress Notes (Signed)
UR completed 

## 2015-02-04 NOTE — Progress Notes (Signed)
Echocardiogram has not been performed. Patient's heart rate is currently 147. Per echo order, the physician wants the echo to be performed when patient's heart rate is less than 110.

## 2015-02-04 NOTE — Care Management Note (Addendum)
    Page 1 of 1   02/09/2015     1:00:01 PM CARE MANAGEMENT NOTE 02/09/2015  Patient:  Miranda Taylor, Miranda Taylor   Account Number:  [id number]  Date Initiated:  02/04/2015  Documentation initiated by:  Elissa Hefty  Subjective/Objective Assessment:   adm w at flutter     Action/Plan:   lives w friend   Anticipated DC Date:     Anticipated DC Plan:  [place]  CM consult  Medication Assistance      Choice offered to / List presented to:             Status of service:  In process, will continue to follow Medicare Important Message given?  YES (If response is "NO", the following Medicare IM given date fields will be blank) Date Medicare IM given:  02/09/2015 Medicare IM given by:  GRAVES-BIGELOW, Date Additional Medicare IM given:   Additional Medicare IM given by:    Discharge Disposition:  HOME/SELF CARE  Per UR Regulation:  Reviewed for med. necessity/level of care/duration of stay  If discussed at [place] of Stay Meetings, dates discussed:   02/10/2015    Comments:   02-09-15 1258 Jacqlyn Krauss, RN,BSN [phone] CM did call Rafael Gonzalez and the medication is available. NO further needs from CM at this time.  3/30 1106 debbie dowell rn,bsn gave pt 30day free eliquis card. she will have 3.60 per month copay w no prior auth req.

## 2015-02-04 NOTE — Progress Notes (Signed)
TEE DCCV re scheduled for tomorrow after she has had 5 doses of Eliquis. Pt can eat today and will make her NPO after MN.  Lorretta Harp, M.D., [place], [facility], Laverta [place] [address]. [place], [place]  [id number]  [phone] 02/04/2015 2:27 PM

## 2015-02-04 NOTE — Progress Notes (Signed)
Pt hr still in 140's.   Md Aware.  However Hr in slightly increasing to 149-150. Pt asymptomatic.  Md paged. Will continue to monitor. Saunders Revel T

## 2015-02-04 NOTE — Progress Notes (Signed)
   Subjective:  C/O new DOE, no CP. Remains in [place] 2:1 at 150 VR  Objective:  Temp:  [98 F (36.7 C)-98.3 F (36.8 C)] 98.3 F (36.8 C) (03/30 0800) Pulse Rate:  [146-171] 148 (03/30 0400) Resp:  [10-26] 14 (03/30 1000) BP: (84-136)/(67-106) 127/106 mmHg (03/30 1000) SpO2:  [83 %-100 %] 94 % (03/30 1000) Weight:  [185 lb (83.915 kg)-193 lb (87.544 kg)] 193 lb (87.544 kg) (03/29 1620) Weight change:   Intake/Output from previous day: 03/29 0701 - 03/30 0700 In: 70 [P.O.:60; I.V.:10] Out: 951 [Urine:951]  Intake/Output from this shift: Total I/O In: 60 [P.O.:60] Out: -   Physical Exam: General appearance: alert and no distress Neck: no adenopathy, no carotid bruit, no JVD, supple, symmetrical, trachea midline and thyroid not enlarged, symmetric, no tenderness/mass/nodules Lungs: clear to auscultation bilaterally Heart: Tachy Extremities: extremities normal, atraumatic, no cyanosis or edema  Lab Results: Results for orders placed or performed during the hospital encounter of 02/03/15 (from the past 48 hour(s))  CBC     Status: Abnormal   Collection Time: 02/03/15  2:10 PM  Result Value Ref Range   WBC 8.1 4.0 - 10.5 K/uL   RBC 4.30 3.87 - 5.11 MIL/uL   Hemoglobin 12.3 12.0 - 15.0 g/dL   HCT 38.2 36.0 - 46.0 %   MCV 88.8 78.0 - 100.0 fL   MCH 28.6 26.0 - 34.0 pg   MCHC 32.2 30.0 - 36.0 g/dL   RDW 16.0 (H) 11.5 - 15.5 %   Platelets 254 150 - 400 K/uL  Basic metabolic panel     Status: Abnormal   Collection Time: 02/03/15  2:10 PM  Result Value Ref Range   Sodium 140 135 - 145 mmol/L   Potassium 3.6 3.5 - 5.1 mmol/L   Chloride 109 96 - 112 mmol/L   CO2 27 19 - 32 mmol/L   Glucose, Bld 104 (H) 70 - 99 mg/dL   BUN 15 6 - 23 mg/dL   Creatinine, Ser 0.84 0.50 - 1.10 mg/dL   Calcium 8.7 8.4 - 10.5 mg/dL   GFR calc non Af Amer 78 (L) >90 mL/min   GFR calc Af Amer >90 >90 mL/min    Comment: (NOTE) The eGFR has been calculated using the CKD EPI equation. This  calculation has not been validated in all clinical situations. eGFR's persistently <90 mL/min signify possible Chronic Kidney Disease.    Anion gap 4 (L) 5 - 15  I-Stat Troponin, ED (not at [facility])     Status: None   Collection Time: 02/03/15  2:47 PM  Result Value Ref Range   Troponin i, poc 0.00 0.00 - 0.08 ng/mL   Comment 3            Comment: Due to the release kinetics of cTnI, a negative result within the first hours of the onset of symptoms does not rule out myocardial infarction with certainty. If myocardial infarction is still suspected, repeat the test at appropriate intervals.   TSH     Status: None   Collection Time: 02/03/15  8:24 PM  Result Value Ref Range   TSH 3.081 0.350 - 4.500 uIU/mL  Brain natriuretic peptide     Status: Abnormal   Collection Time: 02/03/15  8:24 PM  Result Value Ref Range   B Natriuretic Peptide 366.7 (H) 0.0 - 100.0 pg/mL  T4, free     Status: None   Collection Time: 02/03/15  9:37 PM  Result Value Ref Range     Free T4 0.96 0.80 - 1.80 ng/dL    Comment: Performed at [facility]  Magnesium     Status: None   Collection Time: 02/03/15  9:37 PM  Result Value Ref Range   Magnesium 1.6 1.5 - 2.5 mg/dL  Troponin I-(serum)     Status: None   Collection Time: 02/03/15  9:37 PM  Result Value Ref Range   Troponin I <0.03 <0.031 ng/mL    Comment:        NO INDICATION OF MYOCARDIAL INJURY.   Protime-INR     Status: None   Collection Time: 02/03/15  9:37 PM  Result Value Ref Range   Prothrombin Time 13.2 11.6 - 15.2 seconds   INR 0.99 0.00 - 1.49  Troponin I-(serum)     Status: None   Collection Time: 02/04/15  2:05 AM  Result Value Ref Range   Troponin I <0.03 <0.031 ng/mL    Comment:        NO INDICATION OF MYOCARDIAL INJURY.   Comprehensive metabolic panel     Status: Abnormal   Collection Time: 02/04/15  2:05 AM  Result Value Ref Range   Sodium 142 135 - 145 mmol/L   Potassium 3.7 3.5 - 5.1 mmol/L   Chloride 108 96 - 112  mmol/L   CO2 31 19 - 32 mmol/L   Glucose, Bld 116 (H) 70 - 99 mg/dL   BUN 15 6 - 23 mg/dL   Creatinine, Ser 0.99 0.50 - 1.10 mg/dL   Calcium 8.9 8.4 - 10.5 mg/dL   Total Protein 6.2 6.0 - 8.3 g/dL   Albumin 2.8 (L) 3.5 - 5.2 g/dL   AST 46 (H) 0 - 37 U/L   ALT 51 (H) 0 - 35 U/L   Alkaline Phosphatase 92 39 - 117 U/L   Total Bilirubin 0.5 0.3 - 1.2 mg/dL   GFR calc non Af Amer 64 (L) >90 mL/min   GFR calc Af Amer 74 (L) >90 mL/min    Comment: (NOTE) The eGFR has been calculated using the CKD EPI equation. This calculation has not been validated in all clinical situations. eGFR's persistently <90 mL/min signify possible Chronic Kidney Disease.    Anion gap 3 (L) 5 - 15  CBC     Status: Abnormal   Collection Time: 02/04/15  2:05 AM  Result Value Ref Range   WBC 8.5 4.0 - 10.5 K/uL   RBC 4.25 3.87 - 5.11 MIL/uL   Hemoglobin 11.9 (L) 12.0 - 15.0 g/dL   HCT 37.9 36.0 - 46.0 %   MCV 89.2 78.0 - 100.0 fL   MCH 28.0 26.0 - 34.0 pg   MCHC 31.4 30.0 - 36.0 g/dL   RDW 16.3 (H) 11.5 - 15.5 %   Platelets 248 150 - 400 K/uL  MRSA PCR Screening     Status: None   Collection Time: 02/04/15  2:53 AM  Result Value Ref Range   MRSA by PCR NEGATIVE NEGATIVE    Comment:        The GeneXpert MRSA Assay (FDA approved for NASAL specimens only), is one component of a comprehensive MRSA colonization surveillance program. It is not intended to diagnose MRSA infection nor to guide or monitor treatment for MRSA infections.   Troponin I-(serum)     Status: None   Collection Time: 02/04/15  8:15 AM  Result Value Ref Range   Troponin I <0.03 <0.031 ng/mL    Comment:        NO INDICATION  OF MYOCARDIAL INJURY.     Imaging: Imaging results have been reviewed  Assessment/Plan:   1. Active Problems: 2.   Atrial flutter with rapid ventricular response 3.   PAF (paroxysmal atrial fibrillation) 4.   Hyperglycemia 5.   Time Spent Directly with Patient:  20 minutes  Length of Stay:     Pt remains in Aflutter with 2:1 conduction at 150s.  She's had nl 2D and myoview in past. Not on OAC at home secondary to low CHA2DSVASC2 score of 1. She has taken PRN Flecainide. She has gotten 2 doses of Eliquis. Labs OK. BB adjusted. Since we are unsure of duration of Aflutter will require TEE DCCV today. Will most likely require flecainide BID at D/C  , J 02/04/2015, 10:49 AM 

## 2015-02-05 DIAGNOSIS — I4892 Unspecified atrial flutter: Secondary | ICD-10-CM | POA: Diagnosis not present

## 2015-02-05 LAB — BASIC METABOLIC PANEL
Anion gap: 9 (ref 5–15)
BUN: 11 mg/dL (ref 6–23)
CO2: 24 mmol/L (ref 19–32)
CREATININE: 0.97 mg/dL (ref 0.50–1.10)
Calcium: 8.8 mg/dL (ref 8.4–10.5)
Chloride: 108 mmol/L (ref 96–112)
GFR calc non Af Amer: 66 mL/min — ABNORMAL LOW (ref >90)
GFR, EST AFRICAN AMERICAN: 76 mL/min — AB (ref >90)
Glucose, Bld: 104 mg/dL — ABNORMAL HIGH (ref 70–99)
Potassium: 4 mmol/L (ref 3.5–5.1)
Sodium: 141 mmol/L (ref 135–145)

## 2015-02-05 LAB — MAGNESIUM: MAGNESIUM: 2.1 mg/dL (ref 1.5–2.5)

## 2015-02-05 NOTE — Progress Notes (Signed)
Subjective:  No CP/SOB. Remains in [place] with 2:1 at 150  Objective:  Temp:  [97.5 F (36.4 C)-98.4 F (36.9 C)] 98 F (36.7 C) (03/31 0700) Pulse Rate:  [138-148] 146 (03/31 0610) Resp:  [7-28] 25 (03/31 0700) BP: (106-172)/(66-114) 120/70 mmHg (03/31 0700) SpO2:  [94 %-98 %] 98 % (03/31 0700) Weight change:   Intake/Output from previous day: 03/30 0701 - 03/31 0700 In: 770 [P.O.:720; IV Piggyback:50] Out: 800 [Urine:800]  Intake/Output from this shift:    Physical Exam: General appearance: alert and no distress Neck: no adenopathy, no carotid bruit, no JVD, supple, symmetrical, trachea midline and thyroid not enlarged, symmetric, no tenderness/mass/nodules Lungs: clear to auscultation bilaterally Heart: Tachy Extremities: extremities normal, atraumatic, no cyanosis or edema  Lab Results: Results for orders placed or performed during the hospital encounter of 02/03/15 (from the past 48 hour(s))  CBC     Status: Abnormal   Collection Time: 02/03/15  2:10 PM  Result Value Ref Range   WBC 8.1 4.0 - 10.5 K/uL   RBC 4.30 3.87 - 5.11 MIL/uL   Hemoglobin 12.3 12.0 - 15.0 g/dL   HCT 38.2 36.0 - 46.0 %   MCV 88.8 78.0 - 100.0 fL   MCH 28.6 26.0 - 34.0 pg   MCHC 32.2 30.0 - 36.0 g/dL   RDW 16.0 (H) 11.5 - 15.5 %   Platelets 254 150 - 400 K/uL  Basic metabolic panel     Status: Abnormal   Collection Time: 02/03/15  2:10 PM  Result Value Ref Range   Sodium 140 135 - 145 mmol/L   Potassium 3.6 3.5 - 5.1 mmol/L   Chloride 109 96 - 112 mmol/L   CO2 27 19 - 32 mmol/L   Glucose, Bld 104 (H) 70 - 99 mg/dL   BUN 15 6 - 23 mg/dL   Creatinine, Ser 0.84 0.50 - 1.10 mg/dL   Calcium 8.7 8.4 - 10.5 mg/dL   GFR calc non Af Amer 78 (L) >90 mL/min   GFR calc Af Amer >90 >90 mL/min    Comment: (NOTE) The eGFR has been calculated using the CKD EPI equation. This calculation has not been validated in all clinical situations. eGFR's persistently <90 mL/min signify possible  Chronic Kidney Disease.    Anion gap 4 (L) 5 - 15  I-Stat Troponin, ED (not at [facility])     Status: None   Collection Time: 02/03/15  2:47 PM  Result Value Ref Range   Troponin i, poc 0.00 0.00 - 0.08 ng/mL   Comment 3            Comment: Due to the release kinetics of cTnI, a negative result within the first hours of the onset of symptoms does not rule out myocardial infarction with certainty. If myocardial infarction is still suspected, repeat the test at appropriate intervals.   TSH     Status: None   Collection Time: 02/03/15  8:24 PM  Result Value Ref Range   TSH 3.081 0.350 - 4.500 uIU/mL  Brain natriuretic peptide     Status: Abnormal   Collection Time: 02/03/15  8:24 PM  Result Value Ref Range   B Natriuretic Peptide 366.7 (H) 0.0 - 100.0 pg/mL  T4, free     Status: None   Collection Time: 02/03/15  9:37 PM  Result Value Ref Range   Free T4 0.96 0.80 - 1.80 ng/dL    Comment: Performed at [facility]  Magnesium  Status: None   Collection Time: 02/03/15  9:37 PM  Result Value Ref Range   Magnesium 1.6 1.5 - 2.5 mg/dL  Troponin I-(serum)     Status: None   Collection Time: 02/03/15  9:37 PM  Result Value Ref Range   Troponin I <0.03 <0.031 ng/mL    Comment:        NO INDICATION OF MYOCARDIAL INJURY.   Protime-INR     Status: None   Collection Time: 02/03/15  9:37 PM  Result Value Ref Range   Prothrombin Time 13.2 11.6 - 15.2 seconds   INR 0.99 0.00 - 1.49  Troponin I-(serum)     Status: None   Collection Time: 02/04/15  2:05 AM  Result Value Ref Range   Troponin I <0.03 <0.031 ng/mL    Comment:        NO INDICATION OF MYOCARDIAL INJURY.   Comprehensive metabolic panel     Status: Abnormal   Collection Time: 02/04/15  2:05 AM  Result Value Ref Range   Sodium 142 135 - 145 mmol/L   Potassium 3.7 3.5 - 5.1 mmol/L   Chloride 108 96 - 112 mmol/L   CO2 31 19 - 32 mmol/L   Glucose, Bld 116 (H) 70 - 99 mg/dL   BUN 15 6 - 23 mg/dL   Creatinine, Ser  0.99 0.50 - 1.10 mg/dL   Calcium 8.9 8.4 - 10.5 mg/dL   Total Protein 6.2 6.0 - 8.3 g/dL   Albumin 2.8 (L) 3.5 - 5.2 g/dL   AST 46 (H) 0 - 37 U/L   ALT 51 (H) 0 - 35 U/L   Alkaline Phosphatase 92 39 - 117 U/L   Total Bilirubin 0.5 0.3 - 1.2 mg/dL   GFR calc non Af Amer 64 (L) >90 mL/min   GFR calc Af Amer 74 (L) >90 mL/min    Comment: (NOTE) The eGFR has been calculated using the CKD EPI equation. This calculation has not been validated in all clinical situations. eGFR's persistently <90 mL/min signify possible Chronic Kidney Disease.    Anion gap 3 (L) 5 - 15  CBC     Status: Abnormal   Collection Time: 02/04/15  2:05 AM  Result Value Ref Range   WBC 8.5 4.0 - 10.5 K/uL   RBC 4.25 3.87 - 5.11 MIL/uL   Hemoglobin 11.9 (L) 12.0 - 15.0 g/dL   HCT 37.9 36.0 - 46.0 %   MCV 89.2 78.0 - 100.0 fL   MCH 28.0 26.0 - 34.0 pg   MCHC 31.4 30.0 - 36.0 g/dL   RDW 16.3 (H) 11.5 - 15.5 %   Platelets 248 150 - 400 K/uL  MRSA PCR Screening     Status: None   Collection Time: 02/04/15  2:53 AM  Result Value Ref Range   MRSA by PCR NEGATIVE NEGATIVE    Comment:        The GeneXpert MRSA Assay (FDA approved for NASAL specimens only), is one component of a comprehensive MRSA colonization surveillance program. It is not intended to diagnose MRSA infection nor to guide or monitor treatment for MRSA infections.   Troponin I-(serum)     Status: None   Collection Time: 02/04/15  8:15 AM  Result Value Ref Range   Troponin I <0.03 <0.031 ng/mL    Comment:        NO INDICATION OF MYOCARDIAL INJURY.   Magnesium     Status: None   Collection Time: 02/05/15  2:23 AM  Result  Value Ref Range   Magnesium 2.1 1.5 - 2.5 mg/dL  Basic metabolic panel     Status: Abnormal   Collection Time: 02/05/15  2:23 AM  Result Value Ref Range   Sodium 141 135 - 145 mmol/L   Potassium 4.0 3.5 - 5.1 mmol/L   Chloride 108 96 - 112 mmol/L   CO2 24 19 - 32 mmol/L   Glucose, Bld 104 (H) 70 - 99 mg/dL   BUN 11  6 - 23 mg/dL   Creatinine, Ser 0.97 0.50 - 1.10 mg/dL   Calcium 8.8 8.4 - 10.5 mg/dL   GFR calc non Af Amer 66 (L) >90 mL/min   GFR calc Af Amer 76 (L) >90 mL/min    Comment: (NOTE) The eGFR has been calculated using the CKD EPI equation. This calculation has not been validated in all clinical situations. eGFR's persistently <90 mL/min signify possible Chronic Kidney Disease.    Anion gap 9 5 - 15    Imaging: Imaging results have been reviewed  Assessment/Plan:   1. Active Problems: 2.   Atrial flutter with rapid ventricular response 3.   PAF (paroxysmal atrial fibrillation) 4.   Hyperglycemia 5.   Time Spent Directly with Patient:  15 minutes  Length of Stay:   Pt remains in [place] with VR 150 (2:1) on Eliquis BID. She has received 3 doses. For TEE DCCV tomorrow Am after 5th dose. Exam benign. Labs OK  , J 02/05/2015, 9:50 AM

## 2015-02-05 NOTE — Progress Notes (Signed)
UR completed 

## 2015-02-05 NOTE — Anesthesia Preprocedure Evaluation (Addendum)
Anesthesia Evaluation  Patient identified by MRN, date of birth, ID band Patient awake    Reviewed: Allergy & Precautions, NPO status , Patient's Chart, lab work & pertinent test results  History of Anesthesia Complications Negative for: history of anesthetic complications  Airway Mallampati: II  TM Distance: >3 FB Neck ROM: Full    Dental no notable dental hx. (+) Dental Advisory Given   Pulmonary former smoker,  breath sounds clear to auscultation  Pulmonary exam normal       Cardiovascular + dysrhythmias Atrial Fibrillation Rhythm:Irregular Rate:Normal     Neuro/Psych  Headaches, PSYCHIATRIC DISORDERS Anxiety Depression    GI/Hepatic Neg liver ROS, hiatal hernia, GERD-  Medicated and Controlled,  Endo/Other  obesity  Renal/GU negative Renal ROS  negative genitourinary   Musculoskeletal  (+) Arthritis -, Fibromyalgia -  Abdominal   Peds negative pediatric ROS (+)  Hematology negative hematology ROS (+)   Anesthesia Other Findings   Reproductive/Obstetrics negative OB ROS                            Anesthesia Physical Anesthesia Plan  ASA: III  Anesthesia Plan: MAC   Post-op Pain Management:    Induction: Intravenous  Airway Management Planned: Mask  Additional Equipment:   Intra-op Plan:   Post-operative Plan:   Informed Consent: I have reviewed the patients History and Physical, chart, labs and discussed the procedure including the risks, benefits and alternatives for the proposed anesthesia with the patient or authorized representative who has indicated his/her understanding and acceptance.   Dental advisory given  Plan Discussed with: CRNA and Surgeon  Anesthesia Plan Comments:        Anesthesia Quick Evaluation

## 2015-02-06 ENCOUNTER — Observation Stay (HOSPITAL_COMMUNITY): Payer: Medicare Other | Admitting: Anesthesiology

## 2015-02-06 ENCOUNTER — Encounter (HOSPITAL_COMMUNITY): Payer: Self-pay | Admitting: *Deleted

## 2015-02-06 ENCOUNTER — Encounter (HOSPITAL_COMMUNITY)
Admission: EM | Disposition: A | Discharge status: Home / Self Care | Payer: Self-pay | Source: Home / Self Care | Attending: Cardiovascular Disease

## 2015-02-06 DIAGNOSIS — I4892 Unspecified atrial flutter: Secondary | ICD-10-CM | POA: Diagnosis not present

## 2015-02-06 DIAGNOSIS — I513 Intracardiac thrombosis, not elsewhere classified: Secondary | ICD-10-CM

## 2015-02-06 DIAGNOSIS — I499 Cardiac arrhythmia, unspecified: Secondary | ICD-10-CM

## 2015-02-06 DIAGNOSIS — I340 Nonrheumatic mitral (valve) insufficiency: Secondary | ICD-10-CM | POA: Diagnosis not present

## 2015-02-06 HISTORY — DX: Intracardiac thrombosis, not elsewhere classified: I51.3

## 2015-02-06 HISTORY — DX: Cardiac arrhythmia, unspecified: I49.9

## 2015-02-06 HISTORY — PX: TEE WITHOUT CARDIOVERSION: SHX5443

## 2015-02-06 SURGERY — ECHOCARDIOGRAM, TRANSESOPHAGEAL
Anesthesia: Monitor Anesthesia Care

## 2015-02-06 MED ORDER — FENTANYL CITRATE 0.05 MG/ML IJ SOLN
INTRAMUSCULAR | Status: DC | PRN
  Administered 2015-02-06 (×2): 50 ug via INTRAVENOUS

## 2015-02-06 MED ORDER — PROMETHAZINE HCL 25 MG/ML IJ SOLN: 6.2500 mg | INTRAMUSCULAR | Status: DC | PRN

## 2015-02-06 MED ORDER — MIDAZOLAM HCL 5 MG/5ML IJ SOLN
INTRAMUSCULAR | Status: DC | PRN
  Administered 2015-02-06: 2 mg via INTRAVENOUS

## 2015-02-06 MED ORDER — PROPOFOL 10 MG/ML IV BOLUS
INTRAVENOUS | Status: DC | PRN
  Administered 2015-02-06: 20 mg via INTRAVENOUS
  Administered 2015-02-06: 10 mg via INTRAVENOUS
  Administered 2015-02-06 (×4): 20 mg via INTRAVENOUS
  Administered 2015-02-06 (×2): 10 mg via INTRAVENOUS
  Administered 2015-02-06 (×2): 20 mg via INTRAVENOUS
  Administered 2015-02-06: 30 mg via INTRAVENOUS

## 2015-02-06 MED ORDER — LACTATED RINGERS IV SOLN
INTRAVENOUS | Status: DC | PRN
  Administered 2015-02-06: 10:00:00 via INTRAVENOUS

## 2015-02-06 MED ORDER — PERFLUTREN LIPID MICROSPHERE
INTRAVENOUS | Status: DC | PRN
  Administered 2015-02-06: 2 mL via INTRAVENOUS

## 2015-02-06 MED ORDER — PERFLUTREN LIPID MICROSPHERE
INTRAVENOUS | Status: AC
  Filled 2015-02-06: qty 10

## 2015-02-06 MED ORDER — DILTIAZEM HCL 30 MG PO TABS
30.0000 mg | ORAL_TABLET | Freq: Four times a day (QID) | ORAL | Status: DC
  Administered 2015-02-06 – 2015-02-07 (×5): 30 mg via ORAL
  Filled 2015-02-06 (×6): qty 1

## 2015-02-06 MED ORDER — LACTATED RINGERS IV SOLN
INTRAVENOUS | Status: DC
  Administered 2015-02-06: 09:00:00 via INTRAVENOUS

## 2015-02-06 MED ORDER — BUTAMBEN-TETRACAINE-BENZOCAINE 2-2-14 % EX AERO
INHALATION_SPRAY | CUTANEOUS | Status: DC | PRN
  Administered 2015-02-06: 2 via TOPICAL

## 2015-02-06 MED ORDER — FENTANYL CITRATE 0.05 MG/ML IJ SOLN: 25.0000 ug | INTRAMUSCULAR | Status: DC | PRN

## 2015-02-06 NOTE — Progress Notes (Signed)
UR completed 

## 2015-02-06 NOTE — CV Procedure (Signed)
See full TEE report in [facility]; patient sedated by anesthesia with diprovan 200 mg IV total; moderate global reduction in LV function; mild LAE; spontaneous contrast in LA and possible thrombus at opening of LAA; definity used and thrombus could not be exluded; images reviewed with Dr Aundra Dubin; DCCV cancelled; continue apixaban for 4 weeks and then proceed with DCCV. Kirk Ruths

## 2015-02-06 NOTE — Progress Notes (Signed)
  Echocardiogram Echocardiogram Transesophageal has been performed.  Miranda Taylor 02/06/2015, 10:44 AM

## 2015-02-06 NOTE — Progress Notes (Signed)
Echo tech called to find out if we still wanted 2D echo. HR still fast. Had TEE which gave [place] the information we needed. We can reassess LV function with f/u echo when HR slower.   PA-C

## 2015-02-06 NOTE — Transfer of Care (Signed)
Immediate Anesthesia Transfer of Care Note  Patient: Miranda Taylor  Procedure(s) Performed: Procedure(s): TRANSESOPHAGEAL ECHOCARDIOGRAM (TEE) (N/A) CARDIOVERSION (N/A)  Patient Location: PACU and Endoscopy Unit  Anesthesia Type:MAC  Level of Consciousness: awake, alert , oriented and sedated  Airway & Oxygen Therapy: Patient Spontanous Breathing and Patient connected to nasal cannula oxygen  Post-op Assessment: Report given to RN, Post -op Vital signs reviewed and stable and Patient moving all extremities  Post vital signs: Reviewed and stable  Last Vitals:  Filed Vitals:   02/06/15 0855  BP: 131/109  Pulse: 148  Temp: 36.4 C  Resp: 20    Complications: No apparent anesthesia complications

## 2015-02-06 NOTE — Anesthesia Postprocedure Evaluation (Signed)
  Anesthesia Post-op Note  Patient: Miranda Taylor  Procedure(s) Performed: Procedure(s) (LRB): TRANSESOPHAGEAL ECHOCARDIOGRAM (TEE) (N/A)  Patient Location: PACU  Anesthesia Type: MAC  Level of Consciousness: awake and alert   Airway and Oxygen Therapy: Patient Spontanous Breathing  Post-op Pain: mild  Post-op Assessment: Post-op Vital signs reviewed, Patient's Cardiovascular Status Stable, Respiratory Function Stable, Patent Airway and No signs of Nausea or vomiting  Last Vitals:  Filed Vitals:   02/06/15 1230  BP: 123/93  Pulse: 147  Temp:   Resp:     Post-op Vital Signs: stable   Complications: No apparent anesthesia complications

## 2015-02-06 NOTE — Progress Notes (Signed)
     Subjective:  No CP/SOB. Back from endo. DCCV cancelled since possible LAA thrombus seen.  Objective:  Temp:  [97.6 F (36.4 C)-98.4 F (36.9 C)] 97.7 F (36.5 C) (04/01 1047) Pulse Rate:  [145-148] 145 (04/01 1108) Resp:  [14-24] 14 (04/01 1108) BP: (104-134)/(74-109) 106/83 mmHg (04/01 1108) SpO2:  [94 %-98 %] 96 % (04/01 1108) Weight change:   Intake/Output from previous day: 03/31 0701 - 04/01 0700 In: 960 [P.O.:960] Out: 2000 [Urine:2000]  Intake/Output from this shift: Total I/O In: 430 [P.O.:30; I.V.:400] Out: -   Physical Exam: General appearance: alert and no distress Neck: no adenopathy, no carotid bruit, no JVD, supple, symmetrical, trachea midline and thyroid not enlarged, symmetric, no tenderness/mass/nodules Lungs: clear to auscultation bilaterally Heart: tachy Extremities: extremities normal, atraumatic, no cyanosis or edema  Lab Results: Results for orders placed or performed during the hospital encounter of 02/03/15 (from the past 48 hour(s))  Magnesium     Status: None   Collection Time: 02/05/15  2:23 AM  Result Value Ref Range   Magnesium 2.1 1.5 - 2.5 mg/dL  Basic metabolic panel     Status: Abnormal   Collection Time: 02/05/15  2:23 AM  Result Value Ref Range   Sodium 141 135 - 145 mmol/L   Potassium 4.0 3.5 - 5.1 mmol/L   Chloride 108 96 - 112 mmol/L   CO2 24 19 - 32 mmol/L   Glucose, Bld 104 (H) 70 - 99 mg/dL   BUN 11 6 - 23 mg/dL   Creatinine, Ser 0.97 0.50 - 1.10 mg/dL   Calcium 8.8 8.4 - 10.5 mg/dL   GFR calc non Af Amer 66 (L) >90 mL/min   GFR calc Af Amer 76 (L) >90 mL/min    Comment: (NOTE) The eGFR has been calculated using the CKD EPI equation. This calculation has not been validated in all clinical situations. eGFR's persistently <90 mL/min signify possible Chronic Kidney Disease.    Anion gap 9 5 - 15    Imaging: Imaging results have been reviewed  Tele- Aflutter with 2:1, VR 140-150  Assessment/Plan:    1. Active Problems: 2.   Atrial flutter with rapid ventricular response 3.   PAF (paroxysmal atrial fibrillation) 4.   Hyperglycemia 5.   Time Spent Directly with Patient:  15 minutes  Length of Stay:    Pt back from endo. TEE showed possible LAA thrombus and therefore DCCV appropriately not performed with recommendation to continue Eliquis for 4 weeks and re try. LV fxn moderately reduced according to Dr. Jacalyn Lefevre prelim note (Nl LV fxn in past) prob secondary to tachycardia. On q 6 hrs metop. Will add q 6 hour short acting diltiazem to try to reduce VR prior to DC home. Tx tele.   Miranda Taylor 02/06/2015, 11:49 AM

## 2015-02-07 DIAGNOSIS — I513 Intracardiac thrombosis, not elsewhere classified: Secondary | ICD-10-CM | POA: Diagnosis present

## 2015-02-07 DIAGNOSIS — M797 Fibromyalgia: Secondary | ICD-10-CM | POA: Diagnosis present

## 2015-02-07 DIAGNOSIS — M2660 Temporomandibular joint disorder, unspecified: Secondary | ICD-10-CM | POA: Diagnosis present

## 2015-02-07 DIAGNOSIS — I959 Hypotension, unspecified: Secondary | ICD-10-CM | POA: Diagnosis not present

## 2015-02-07 DIAGNOSIS — I480 Paroxysmal atrial fibrillation: Secondary | ICD-10-CM | POA: Diagnosis present

## 2015-02-07 DIAGNOSIS — Z981 Arthrodesis status: Secondary | ICD-10-CM | POA: Diagnosis not present

## 2015-02-07 DIAGNOSIS — F331 Major depressive disorder, recurrent, moderate: Secondary | ICD-10-CM | POA: Diagnosis present

## 2015-02-07 DIAGNOSIS — R739 Hyperglycemia, unspecified: Secondary | ICD-10-CM | POA: Diagnosis present

## 2015-02-07 DIAGNOSIS — Z87891 Personal history of nicotine dependence: Secondary | ICD-10-CM | POA: Diagnosis not present

## 2015-02-07 DIAGNOSIS — I4892 Unspecified atrial flutter: Secondary | ICD-10-CM | POA: Diagnosis present

## 2015-02-07 DIAGNOSIS — Z79899 Other long term (current) drug therapy: Secondary | ICD-10-CM | POA: Diagnosis not present

## 2015-02-07 DIAGNOSIS — K219 Gastro-esophageal reflux disease without esophagitis: Secondary | ICD-10-CM | POA: Diagnosis present

## 2015-02-07 MED ORDER — DILTIAZEM HCL 60 MG PO TABS
60.0000 mg | ORAL_TABLET | Freq: Four times a day (QID) | ORAL | Status: DC
  Administered 2015-02-07 – 2015-02-08 (×3): 60 mg via ORAL
  Filled 2015-02-07 (×3): qty 1

## 2015-02-07 MED ORDER — DOXYCYCLINE HYCLATE 100 MG PO TABS
100.0000 mg | ORAL_TABLET | Freq: Two times a day (BID) | ORAL | Status: DC
  Administered 2015-02-07 – 2015-02-11 (×9): 100 mg via ORAL
  Filled 2015-02-07 (×10): qty 1

## 2015-02-07 NOTE — Progress Notes (Signed)
    SUBJECTIVE:  No acute pain.  No SOB.  Complains of sinus drainage.  PHYSICAL EXAM Filed Vitals:   02/06/15 1915 02/07/15 0500 02/07/15 0851 02/07/15 1059  BP: 118/77 110/81 117/81 122/84  Pulse: 147 142 142 153  Temp: 97.7 F (36.5 C) 97.8 F (36.6 C) 98.1 F (36.7 C)   TempSrc:   Oral   Resp: 20 17 16    Height: 5\' 7"  (1.702 m)     Weight: 188 lb 8.6 oz (85.521 kg)     SpO2: 94% 96% 96%    General:  No distress Lungs:  Clear Heart:  RRR tachy Abdomen:  Positive bowel sounds, no rebound no guarding Extremities:  No edema  LABS: Lab Results  Component Value Date   TROPONINI <0.03 02/04/2015   No results found for this or any previous visit (from the past 24 hour(s)).  Intake/Output Summary (Last 24 hours) at 02/07/15 1322 Last data filed at 02/07/15 0500  Gross per 24 hour  Intake    240 ml  Output    100 ml  Net    140 ml     ASSESSMENT AND PLAN:  Atrial flutter:   On Eliquis.  Not cardioverted because of "smoke" in LA on TEE.  Rate control and anticoagulation planned.  However, rate is still not controlled.  I will increase the Cardizem.  If we cannot control the rate she might need to have DCCV despite the risk.  Sinus pain:  Start Doxy.  [place]  02/07/2015 1:22 PM

## 2015-02-08 MED ORDER — AMIODARONE LOAD VIA INFUSION: 150.0000 mg | Freq: Once | INTRAVENOUS | Status: DC

## 2015-02-08 MED ORDER — DILTIAZEM HCL 100 MG IV SOLR
10.0000 mg/h | INTRAVENOUS | Status: DC
  Filled 2015-02-08: qty 100

## 2015-02-08 MED ORDER — METOPROLOL TARTRATE 50 MG PO TABS
50.0000 mg | ORAL_TABLET | Freq: Four times a day (QID) | ORAL | Status: DC
  Administered 2015-02-08 – 2015-02-11 (×14): 50 mg via ORAL
  Filled 2015-02-08 (×14): qty 1

## 2015-02-08 MED ORDER — AMIODARONE IV BOLUS ONLY 150 MG/100ML
150.0000 mg | Freq: Once | INTRAVENOUS | Status: AC
  Administered 2015-02-08: 150 mg via INTRAVENOUS
  Filled 2015-02-08: qty 100

## 2015-02-08 NOTE — Progress Notes (Addendum)
Subjective:  SOB when up  Objective:  Vital Signs in the last 24 hours: Temp:  [97.7 F (36.5 C)-98.1 F (36.7 C)] 97.7 F (36.5 C) (04/03 0543) Pulse Rate:  [71-153] 71 (04/03 0543) Resp:  [18] 18 (04/03 0543) BP: (92-122)/(62-84) 97/71 mmHg (04/03 0543) SpO2:  [94 %-98 %] 98 % (04/03 0543) Weight:  [188 lb 11.4 oz (85.6 kg)] 188 lb 11.4 oz (85.6 kg) (04/03 0543)  Intake/Output from previous day:  Intake/Output Summary (Last 24 hours) at 02/08/15 6153 Last data filed at 02/07/15 2016  Gross per 24 hour  Intake    100 ml  Output      0 ml  Net    100 ml    Physical Exam: General appearance: alert, cooperative and no distress Lungs: clear to auscultation bilaterally Heart: irregularly irregular rhythm and rate 140 Abdomen: small peri umbilical erosion from tick bite 3 weeks ago Extremities: extremities normal, atraumatic, no cyanosis or edema   Rate: 140  Rhythm: atrial flutter  Lab Results: No results for input(s): WBC, HGB, PLT in the last 72 hours. No results for input(s): NA, K, CL, CO2, GLUCOSE, BUN, CREATININE in the last 72 hours. No results for input(s): TROPONINI in the last 72 hours.  Invalid input(s): CK, MB No results for input(s): INR in the last 72 hours.  Imaging: Imaging results have been reviewed  Cardiac Studies: Echo 02/06/15 Study Conclusions  - Left ventricle: Systolic function was moderately reduced. The estimated ejection fraction was in the range of 35% to 40%. Diffuse hypokinesis. - Aortic valve: No evidence of vegetation. There was mild regurgitation. - Mitral valve: No evidence of vegetation. There was mild regurgitation. - Left atrium: The atrium was mildly dilated. There was a possible, mobilethrombusin the appendage. There was spontaneous echo contrast (&quot;smoke&quot;) in the cavity. - Right atrium: No evidence of thrombus in the atrial cavity or appendage. - Atrial septum: No defect or patent foramen ovale  was identified. - Tricuspid valve: No evidence of vegetation. There was mild-moderate regurgitation. - Pulmonic valve: No evidence of vegetation. - Pericardium, extracardiac: A small pericardial effusion was identified.  Impressions:  - Technically difficult due to tachycardia (HR 140-150); definity used to assess LAA; noderate global reduction in LV function (EF difficult to quantitate due to tachycardia); mild LAE; spontaneous contrast in LA; possible thrombus at opening of LAA; mild MR and AI; mild to moderate TR; suggest anticoagulation for four weeks and then proceed with DCCV.   Assessment/Plan:   Principal Problem:   Atrial flutter with rapid ventricular response Active Problems:   PAF (paroxysmal atrial fibrillation)   Fibromyalgia   Major depressive disorder, recurrent episode, moderate   PLAN: Will review with MD, rate not controlled on current medical Rx. Change to IV Diltiazem. NPO till evelauted in am, possible cardioversion.    She mentioned to me she had a tick bite from 3 weeks ago, Vibramycin started 4/2/for sinusitis, not tick bite-I don't think she needs prolonged antibiotic therapy for her tick bite. She has no rash, fever, or elevated WBC.   Kerin Ransom PA-C Beeper [phone] 02/08/2015, 9:03 AM   History and all data above reviewed.  Patient examined.  I agree with the findings as above.  She is not uncomfortable.  She has no pain.  However, her rate (though transiently down) is mostly up.  The patient exam reveals COR:RRR  ,  Lungs: Clear  ,  Abd: Positive bowel sounds, no rebound no guarding, Ext No edema  .  All available labs, radiology testing, previous records reviewed. Agree with documented assessment and plan. Atrial flutter:  Put on IV cardizem.  I have discussed with EP.  She will likely need DCCV despite findings on TEE.  Keep NPO after MN. Will discuss further with Dr. Stanford Breed.  Alyda Megna  9:25 AM  02/08/2015   Addendum:  I spoke  with Dr. Caryl Comes.  This is a difficult situation.  In this situation (where cardioversion would be high risk for the next four weeks but we need rate control) we will try IV amiodarone and PO amiodarone for rate control today.

## 2015-02-08 NOTE — Progress Notes (Signed)
Starting at 0501, Patient HR finally starts to slow down to low 100's to 120's. Still in [place]. Will continue to monitor

## 2015-02-08 NOTE — Progress Notes (Signed)
This RN called Cards Fellow, Oval Linsey about Patients BP consistently lowering. BP was 96/44 rechecked at 101/54, needed hold parameters for PO Metoprolol. Discussed with Cards fellow other options for Cardiac Meds since pt is now hypotensive. Cards Fellow to pass on in am. Gave hold parameters and also wants nursing to stagger Cardizem and Lopressor.

## 2015-02-09 ENCOUNTER — Encounter (HOSPITAL_COMMUNITY): Payer: Self-pay | Admitting: Cardiology

## 2015-02-09 DIAGNOSIS — F331 Major depressive disorder, recurrent, moderate: Secondary | ICD-10-CM

## 2015-02-09 LAB — BASIC METABOLIC PANEL
Anion gap: 9 (ref 5–15)
BUN: 12 mg/dL (ref 6–23)
CO2: 28 mmol/L (ref 19–32)
Calcium: 9.7 mg/dL (ref 8.4–10.5)
Chloride: 103 mmol/L (ref 96–112)
Creatinine, Ser: 0.91 mg/dL (ref 0.50–1.10)
GFR calc Af Amer: 82 mL/min — ABNORMAL LOW (ref >90)
GFR calc non Af Amer: 71 mL/min — ABNORMAL LOW (ref >90)
Glucose, Bld: 101 mg/dL — ABNORMAL HIGH (ref 70–99)
Potassium: 4.4 mmol/L (ref 3.5–5.1)
Sodium: 140 mmol/L (ref 135–145)

## 2015-02-09 MED ORDER — DILTIAZEM HCL 25 MG/5ML IV SOLN
10.0000 mg | Freq: Once | INTRAVENOUS | Status: AC
  Administered 2015-02-09: 10 mg via INTRAVENOUS
  Filled 2015-02-09: qty 5

## 2015-02-09 MED ORDER — DILTIAZEM HCL 30 MG PO TABS
30.0000 mg | ORAL_TABLET | Freq: Four times a day (QID) | ORAL | Status: DC
  Administered 2015-02-09 (×2): 30 mg via ORAL
  Filled 2015-02-09 (×2): qty 1

## 2015-02-09 MED ORDER — DILTIAZEM HCL 60 MG PO TABS
60.0000 mg | ORAL_TABLET | Freq: Four times a day (QID) | ORAL | Status: DC
  Administered 2015-02-09 – 2015-02-10 (×3): 60 mg via ORAL
  Filled 2015-02-09 (×3): qty 1

## 2015-02-09 NOTE — Progress Notes (Signed)
Subjective:  Remains tachycardic. No improvement with amiodarone bolus overnight. It seems like she was on cardizem with possible improvement in rate, but may have had an IV site reaction - she says it was mildly red, the IV Was removed and it has improved. No signs of breathing difficulty, hives, throat swelling, etc.  Objective:  Vital Signs in the last 24 hours: Temp:  [97.8 F (36.6 C)] 97.8 F (36.6 C) (04/04 0529) Pulse Rate:  [138-139] 138 (04/04 0529) Resp:  [18] 18 (04/04 0529) BP: (100-109)/(61-76) 104/68 mmHg (04/04 0529) SpO2:  [98 %] 98 % (04/04 0529) Weight:  [185 lb 9.6 oz (84.188 kg)] 185 lb 9.6 oz (84.188 kg) (04/04 0529)  Intake/Output from previous day:  Intake/Output Summary (Last 24 hours) at 02/09/15 0943 Last data filed at 02/09/15 7048  Gross per 24 hour  Intake    363 ml  Output      0 ml  Net    363 ml    Physical Exam: General appearance: alert, cooperative and no distress Lungs: clear to auscultation bilaterally Heart: irregularly irregular rhythm and rate 140 Abdomen: small peri umbilical erosion from tick bite 3 weeks ago Extremities: extremities normal, atraumatic, no cyanosis or edema   Rate: 140  Rhythm: atrial flutter  Lab Results: No results for input(s): WBC, HGB, PLT in the last 72 hours.  Recent Labs  02/09/15 0530  NA 140  K 4.4  CL 103  CO2 28  GLUCOSE 101*  BUN 12  CREATININE 0.91   No results for input(s): TROPONINI in the last 72 hours.  Invalid input(s): CK, MB No results for input(s): INR in the last 72 hours.  Imaging: Imaging results have been reviewed  Cardiac Studies: Echo 02/06/15 Study Conclusions  - Left ventricle: Systolic function was moderately reduced. The estimated ejection fraction was in the range of 35% to 40%. Diffuse hypokinesis. - Aortic valve: No evidence of vegetation. There was mild regurgitation. - Mitral valve: No evidence of vegetation. There was mild regurgitation. -  Left atrium: The atrium was mildly dilated. There was a possible, mobilethrombusin the appendage. There was spontaneous echo contrast (&quot;smoke&quot;) in the cavity. - Right atrium: No evidence of thrombus in the atrial cavity or appendage. - Atrial septum: No defect or patent foramen ovale was identified. - Tricuspid valve: No evidence of vegetation. There was mild-moderate regurgitation. - Pulmonic valve: No evidence of vegetation. - Pericardium, extracardiac: A small pericardial effusion was identified.  Impressions:  - Technically difficult due to tachycardia (HR 140-150); definity used to assess LAA; noderate global reduction in LV function (EF difficult to quantitate due to tachycardia); mild LAE; spontaneous contrast in LA; possible thrombus at opening of LAA; mild MR and AI; mild to moderate TR; suggest anticoagulation for four weeks and then proceed with DCCV.   Assessment/Plan:   Principal Problem:   Atrial flutter with rapid ventricular response Active Problems:   Fibromyalgia   Major depressive disorder, recurrent episode, moderate   PAF (paroxysmal atrial fibrillation)   PLAN:  No benefit from IV amiodarone overnight. She likely has LAA thrombus. Will need 1 month of anticoagulation and repeat TEE/DCCV at that time if thrombus has resolved. Rate control is suboptimal. No benefit from IV amiodarone. Increased risk of conversion on amiodarone - she understands this. Not sure diltiazem is contraindicated based on local IV site reaction. She is willing to try po cardizem today in addition to her B-blocker. Will start 30 mg po q6 hrs. Hopefully we  can get better rate control.  Pixie Casino, MD, [facility] Attending Cardiologist [facility]]

## 2015-02-09 NOTE — Progress Notes (Signed)
Pt heartrate consistently in 140s with BP 106/84. Patient asymptomatic. Spoke with cardiologist on call - MD Colon Flattery advised to give Cardizem 12am dose STAT and if no drop in heartrate to callback and discuss further options for metoprolol dosing. Will continue to monitor closely.

## 2015-02-10 DIAGNOSIS — I959 Hypotension, unspecified: Secondary | ICD-10-CM

## 2015-02-10 MED ORDER — DILTIAZEM HCL ER COATED BEADS 240 MG PO CP24
240.0000 mg | ORAL_CAPSULE | Freq: Every day | ORAL | Status: DC
  Administered 2015-02-10 – 2015-02-11 (×2): 240 mg via ORAL
  Filled 2015-02-10 (×2): qty 1

## 2015-02-10 MED ORDER — METOPROLOL TARTRATE 1 MG/ML IV SOLN
5.0000 mg | INTRAVENOUS | Status: DC | PRN
  Filled 2015-02-10: qty 5

## 2015-02-10 NOTE — Progress Notes (Addendum)
Pt heartrate now sustaining at 139, cardiologist on call paged for further orders. Scheduled medications given. BP stable, patient asymptomatic. Will continue to monitor closely.  1257 - new orders received from MD [facility] for IV metoprolol for rate control. Will continue to monitor closely.

## 2015-02-10 NOTE — Consult Note (Signed)
ELECTROPHYSIOLOGY CONSULT NOTE    Primary Care Physician: Tula Nakayama Referring Physician: Shelva Majestic, MD Primary EP: Dr Tacey Ruiz Date: 02/03/2015  Reason for consultation:  Atrial flutter and atrial fibrillation with tachycardia and fatigue. Patient found to have an LA thrombus on TEE.  Miranda Taylor is a 54 y.o. female with a h/o spinal stenosis, GERD, chronic low back pain, migraines, paroxysmal atrial fibrillation, and newly diagnosed with atrial flutter with RVR, not on oral anticoagulation due to low CHA2DS2VASc score of 1 (female sex). Patient takes metoprolol 50 mg twice a day and prn Flecainide when she has palpitations. She states that her symptoms of atrial fibrillation are mostly well controlled with Metoprolol. However, she reports that she's been experiencing increased fatigue over the past 10 days. She saw her PCP for what she thought was a sinus infection and a recent tick bite. During her PCPs visit, she was found to have tachycardia and subsequently admitted for inpatient treatment. During her hospitalization, her EKG revealed new onset atrial flutter with RVR. She was started on Cardizem drip and prn IV Metoprolol together with anticoagulation with a plan for DCCV after TEE. Heart rate has remained in the 140s despite above therapy. TEE revealed possible left atrial thrombus. EP was consulted to assist with management of this complex situation.   Today, she reports that her symptoms of fatigue have not changed since admission. She denies symptoms of palpitations, chest pain, shortness of breath, orthopnea, PND, lower extremity edema, dizziness, presyncope, syncope, or neurologic sequela. The patient is tolerating medications without difficulties and is otherwise without complaint today.   Past Medical History  Diagnosis Date  . GERD (gastroesophageal reflux disease)   . Chronic lower back pain   . Paroxysmal atrial fibrillation   . Migraines   . Spinal  stenosis   . SUI (stress urinary incontinence, female)   . History of kidney stones   . TMJ (temporomandibular joint syndrome)     LEFT SIDE--  WEAR  MOUTH GUARD  . H/O hiatal hernia   . Plantar fasciitis    Past Surgical History  Procedure Laterality Date  . Anterior cervical decomp/discectomy fusion  04-08-2010    C4 -- C5  . Posterior fusion lumbar spine  09-16-2009    L4 -- L5  . Appendectomy  1984  . Transthoracic echocardiogram  04-03-2012    normal LVF/  ef 55-60%  . Cardiovascular stress test  04-27-2014   dr berry    normal perfusion study/  no ischemia/  ef 61%  . Lasik  1990's  . Bilateral thunb joint arthrodesis  left 2007/   right 2010  . Negative sleep study  04-15-2013  . Cystoscopy N/A 08/04/2014    Procedure: CYSTOSCOPY;  Surgeon: Ardis Hughs, MD;  Location: [facility];  Service: Urology;  Laterality: N/A;  . Pubovaginal sling N/A 08/04/2014    Procedure: BOSTON SCIENTIFIC RETRO-PUBIC MID URETHRAL SLING;  Surgeon: Ardis Hughs, MD;  Location[facility];  Service: Urology;  Laterality: N/A;  . Tee without cardioversion N/A 02/06/2015    Procedure: TRANSESOPHAGEAL ECHOCARDIOGRAM (TEE);  Surgeon: Lelon Perla, MD;  Location: [facility] END[facility]COPY;  Service: Cardiovascular;  Laterality: N/A;   Scheduled medications:   . apixaban  5 mg Oral BID  . diltiazem  60 mg Oral 4 times per day  . doxycycline  100 mg Oral Q12H  . LORazepam  0.5 mg Oral QHS  . metoprolol  50 mg Oral 4  times per day  . multivitamin with minerals  1 tablet Oral Daily  . sodium chloride  3 mL Intravenous Q12H  . venlafaxine XR  75 mg Oral QPM  sodium chloride, acetaminophen, fentaNYL, loratadine, metoprolol, ondansetron (ZOFRAN) IV, oxyCODONE, pantoprazole, promethazine, sodium chloride, zolpidem     No Active Allergies  History   Social History  . Marital Status: Legally Separated    Spouse Name: N/A  . Number of Children: N/A  . Years of  Education: N/A   Occupational History  . Disabled    Social History Main Topics  . Smoking status: Former Smoker -- 0.50 packs/day for 20 years    Types: Cigarettes    Quit date: 11/07/2001  . Smokeless tobacco: Never Used  . Alcohol Use: No  . Drug Use: No  . Sexual Activity: Not on file   Other Topics Concern  . Not on file   Social History Narrative   Pt lives with husband in [place].  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 Hypertension    . Heart attack Father     age 45 - passed away of MI, first diagnosed with heart disease in his 3s  . Heart disease Mother     ROS- All systems are reviewed and negative except as per the HPI above  Physical Exam: Telemetry: Filed Vitals:   02/10/15 0620 02/10/15 1230 02/10/15 1233 02/10/15 1533  BP: 101/73 107/81 107/73 113/82  Pulse: 137  139 139  Temp: 97.7 F (36.5 C)   98.7 F (37.1 C)  TempSrc: Oral   Oral  Resp: 16   18  Height:      Weight: 183 lb 10.3 oz (83.3 kg)     SpO2: 98%   98%    GEN- The patient is well appearing, alert and oriented x 3 today.   Head- normocephalic, atraumatic Eyes-  Sclera clear, conjunctiva pink Ears- hearing intact Oropharynx- clear Neck- supple, no JVP Lymph- no cervical lymphadenopathy, no thyroidomegaly Lungs- Clear to ausculation bilaterally, normal work of breathing Heart- Tachycardia with irregular rate and rhythm, no murmurs, rubs or gallops, PMI not laterally displaced GI- soft, NT, ND, + BS Extremities- no clubbing, cyanosis, or edema MS- no significant deformity or atrophy Skin- no rash or lesion Psych- euthymic mood, full affect Neuro- strength and sensation are intact  EKG- Atrial flutter with a 2:1 AV conduction. Nonspecific T-wave changes. RVR with a rate of 140.  Telemetry: persistent RVR with HR 140-145  Labs:   Lab Results  Component Value Date   WBC 8.5  02/04/2015   HGB 11.9* 02/04/2015   HCT 37.9 02/04/2015   MCV 89.2 02/04/2015   PLT 248 02/04/2015    Recent Labs Lab 02/04/15 0205  02/09/15 0530  NA 142  < > 140  K 3.7  < > 4.4  CL 108  < > 103  CO2 31  < > 28  BUN 15  < > 12  CREATININE 0.99  < > 0.91  CALCIUM 8.9  < > 9.7  PROT 6.2  --   --   BILITOT 0.5  --   --   ALKPHOS 92  --   --   ALT 51*  --   --   AST 46*  --   --   GLUCOSE 116*  < > 101*  < > = values in this interval not  displayed. Lab Results  Component Value Date   CKTOTAL 32 04/02/2012   CKMB 1.5 04/02/2012   TROPONINI <0.03 02/04/2015    Lab Results  Component Value Date   CHOL 180 10/15/2014   Lab Results  Component Value Date   HDL 62 10/15/2014   Lab Results  Component Value Date   LDLCALC 106* 10/15/2014   Lab Results  Component Value Date   TRIG 58 10/15/2014   Lab Results  Component Value Date   CHOLHDL 2.9 10/15/2014   No results found for: LDLDIRECT    Radiology: No imaging studies   Transesophageal echocardiogram :02/06/2015 Study Conclusions  - Left ventricle: Systolic function was moderately reduced. The estimated ejection fraction was in the range of 35% to 40%. Diffuse hypokinesis. - Aortic valve: No evidence of vegetation. There was mild regurgitation. - Mitral valve: No evidence of vegetation. There was mild regurgitation. - Left atrium: The atrium was mildly dilated. There was a possible, mobilethrombusin the appendage. There was spontaneous echo contrast (&quot;smoke&quot;) in the cavity. - Right atrium: No evidence of thrombus in the atrial cavity or appendage. - Atrial septum: No defect or patent foramen ovale was identified. - Tricuspid valve: No evidence of vegetation. There was mild-moderate regurgitation. - Pulmonic valve: No evidence of vegetation. - Pericardium, extracardiac: A small pericardial effusion was identified.  Impressions:  - Technically difficult due to tachycardia  (HR 140-150); definity used to assess LAA; noderate global reduction in LV function (EF difficult to quantitate due to tachycardia); mild LAE; spontaneous contrast in LA; possible thrombus at opening of LAA; mild MR and AI; mild to moderate TR; suggest anticoagulation for four weeks and then proceed with DCCV.  Transthoracic echocardiogram 04/03/2012:  Study Conclusions  - Left ventricle: The cavity size was normal. Systolic function was normal. The estimated ejection fraction was in the range of 55% to 60%. Wall motion was normal; there were no regional wall motion abnormalities. - Atrial septum: No defect or patent foramen ovale was identified.  ASSESSMENT AND PLAN:  54 year old woman with past medical history of well-controlled paroxysmal atrial fibrillation, not on anticoagulants due to very low risk of stroke who presents with about 10 days history of increased fatigue. She has been discovered to have new onset atrial flutter with RVR with HR of 140s. DCCV was planned but TEE reveals concern for left atrial thrombus precluding the procedure. She has failed to achieve rate control despite treatment with Cardizem and Metoprolol. On admission she was initiated on Apixaban in anticipation of DCCV and she is currently on it. EP is consulted to consider other treatment modalities. TSH has been normal at 3.08 on 02/03/2015.  Atrial Flutter: Symptoms of fatigue are likely related to her RVR in the setting of atrial fibrillation. She has not responded to medical therapy. Presence of LA thrombus precludes DCCV Plan  - Will continue with anticoagulation for at least 4 weeks and plan for radiofrequency ablation  - Increase Cardizem ER to 240 mg BID -Continue with metoprolol 50 mg every 6 hours. We will titrate this dose based on her response with increased Cardizem. -Consider discharge over the next 24-48 hours. We will accept some degree of tachycardia at the time of discharge  since she is minimally symptomatic and reevaluate her as outpatient  Atrial fibrillation: Paroxysmal in nature.  Low CHA2DS2VASc score of 1 (female sex).  Plan -would anticipate atrial flutter ablation with ongoing medical therapy for atrial fibrillation.  Depression: Continue with venlafaxine   I have discussed  the case with my attending, Dr. Rayann Heman.  Signed:  Jessee Avers, MD PGY-3 Internal Medicine Teaching Service Pager: [phone] 02/10/2015, 4:36 PM   I have seen, examined the patient, and reviewed the above assessment and plan with Dr Alice Rieger.  Changes to above are made where necessary.  The patient has typical appearing atrial flutter refractory to rate control.  Unfortunately, rhythm control options are limited due to left atrial thrombus.  She has been appropriately initiated on eliquis.  I have advised atrial flutter ablation after 4 weeks of anticoagulation (3 wks from now).  In the interim, will increase diltiazem CD to 240mg  BID.  I think that she could be safely discharged soon (even despite RVR) with plans to return electively for ablation.   Co Sign: Thompson Grayer, MD 02/10/2015 8:04 PM

## 2015-02-10 NOTE — Progress Notes (Signed)
    Subjective:  Remains tachycardic. No improvement with amiodarone bolus. Started on cardizem - again, without improvement. Seems asymptomatic.  Objective:  Vital Signs in the last 24 hours: Temp:  [97.7 F (36.5 C)-99 F (37.2 C)] 97.7 F (36.5 C) (04/05 0620) Pulse Rate:  [137-142] 137 (04/05 0620) Resp:  [16-18] 16 (04/05 0620) BP: (98-115)/(67-94) 101/73 mmHg (04/05 0620) SpO2:  [98 %-99 %] 98 % (04/05 0620) Weight:  [183 lb 10.3 oz (83.3 kg)] 183 lb 10.3 oz (83.3 kg) (04/05 0620)  Intake/Output from previous day:  Intake/Output Summary (Last 24 hours) at 02/10/15 0920 Last data filed at 02/09/15 2000  Gross per 24 hour  Intake    243 ml  Output      0 ml  Net    243 ml    Physical Exam: General appearance: alert, cooperative and no distress Lungs: clear to auscultation bilaterally Heart: irregularly irregular rhythm and rate 140 Abdomen: small peri umbilical erosion from tick bite 3 weeks ago Extremities: extremities normal, atraumatic, no cyanosis or edema   Rate: 140  Rhythm: atrial flutter  Lab Results: No results for input(s): WBC, HGB, PLT in the last 72 hours.  Recent Labs  02/09/15 0530  NA 140  K 4.4  CL 103  CO2 28  GLUCOSE 101*  BUN 12  CREATININE 0.91   No results for input(s): TROPONINI in the last 72 hours.  Invalid input(s): CK, MB No results for input(s): INR in the last 72 hours.  Imaging: Imaging results have been reviewed  Cardiac Studies: Echo 02/06/15 Study Conclusions  - Left ventricle: Systolic function was moderately reduced. The estimated ejection fraction was in the range of 35% to 40%. Diffuse hypokinesis. - Aortic valve: No evidence of vegetation. There was mild regurgitation. - Mitral valve: No evidence of vegetation. There was mild regurgitation. - Left atrium: The atrium was mildly dilated. There was a possible, mobilethrombusin the appendage. There was spontaneous echo contrast (&quot;smoke&quot;)  in the cavity. - Right atrium: No evidence of thrombus in the atrial cavity or appendage. - Atrial septum: No defect or patent foramen ovale was identified. - Tricuspid valve: No evidence of vegetation. There was mild-moderate regurgitation. - Pulmonic valve: No evidence of vegetation. - Pericardium, extracardiac: A small pericardial effusion was identified.  Impressions:  - Technically difficult due to tachycardia (HR 140-150); definity used to assess LAA; noderate global reduction in LV function (EF difficult to quantitate due to tachycardia); mild LAE; spontaneous contrast in LA; possible thrombus at opening of LAA; mild MR and AI; mild to moderate TR; suggest anticoagulation for four weeks and then proceed with DCCV.   Assessment/Plan:   Principal Problem:   Atrial flutter with rapid ventricular response Active Problems:   Fibromyalgia   Major depressive disorder, recurrent episode, moderate   PAF (paroxysmal atrial fibrillation)   PLAN:  Now on b-blocker and CCB - limited by hypotension. Rate has remained in the 140's in flutter. +LA thrombus. Will ask EP again for formal recommendations. May need to continue current therapy and consider sending her home with anticoagulation later today if there are no other options. Amiodarone did not affect rate - also >than random chance of conversion, may increase stroke risk.   Pixie Casino, MD, [facility] Attending Cardiologist [facility]]

## 2015-02-10 NOTE — Progress Notes (Signed)
Unable to give metoprolol IV for rate control due to SBP of 98, will continue to monitor closely. Patient still asymptomatic with heartrate at 137.

## 2015-02-11 MED ORDER — DOXYCYCLINE HYCLATE 100 MG PO TABS: 100.0000 mg | ORAL_TABLET | Freq: Two times a day (BID) | ORAL | Status: DC

## 2015-02-11 MED ORDER — APIXABAN 5 MG PO TABS: 5.0000 mg | ORAL_TABLET | Freq: Two times a day (BID) | ORAL | Status: DC

## 2015-02-11 MED ORDER — METOPROLOL TARTRATE 100 MG PO TABS: 100.0000 mg | ORAL_TABLET | Freq: Two times a day (BID) | ORAL | Status: DC

## 2015-02-11 MED ORDER — DILTIAZEM HCL ER COATED BEADS 240 MG PO CP24: 240.0000 mg | ORAL_CAPSULE | Freq: Every day | ORAL | Status: DC

## 2015-02-11 NOTE — Discharge Summary (Signed)
Discharge Summary   Patient ID: Miranda Taylor,  MRN: [id number], DOB/AGE: [birth year] 54 y.o.  Admit date: 02/03/2015 Discharge date: 02/11/2015  Primary Care Provider: Bing Taylor Primary Cardiologist: Miranda Fridge, MD / G. Lovena Le, MD (EP)  Discharge Diagnoses Principal Problem:   Atrial flutter with rapid ventricular response  **oral anticoagulation (Eliquis) started with plan for TEE/DCCV vs. Ablation in ~ 4 wks.  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 The   estimated ejection fraction was in the range of 35% to 40%.   Diffuse hypokinesis. - Aortic valve: No evidence of vegetation. There was mild   regurgitation. - Mitral valve: No evidence of vegetation. There was mild   regurgitation. - Left atrium: The atrium was mildly dilated. There was a possible,   mobilethrombusin the appendage. There was spontaneous echo   contrast (&quot;smoke&quot;) in the cavity. - Right atrium: No evidence of thrombus in the atrial cavity or   appendage. - Atrial septum: No defect or patent foramen ovale was identified. - Tricuspid valve: No evidence of vegetation. There was   mild-moderate regurgitation. - Pulmonic valve: No evidence of vegetation. - Pericardium, extracardiac: A small pericardial effusion was   identified.  Impressions:  - Technically difficult due to tachycardia (HR 140-150); definity   used to assess LAA; noderate global reduction in LV function (EF   difficult to quantitate due to tachycardia); mild LAE;   spontaneous contrast in LA; possible thrombus at opening of LAA;   mild MR and AI; mild to moderate TR; suggest anticoagulation for   four  weeks and then proceed with DCCV. _____________   History of Present Illness  54 y/o female with a h/o paroxysmal afib on beta blocker and flecainide at home.  She has not been anticoagulated in the setting of a CHA2DS2VASc of 1 (female gender).  Over a period of a week prior to admission, she had noted increasing fatigue and decreased exercise tolerance.  On 3/29, he saw her PCP and was found to be in aflutter with rates in the 150's.  She was transported to the [facility] ED where she was treated with adenosine with a brief period of HR slowing and then continued tachycardia.  She was seen by cardiology and admitted for further evaluation.  Hospital Course  Patient ruled out for MI.  She remained in rapid atrial flutter despite IV diltiazem, which was subsequently discontinued in the setting of a local skin reaction to the drug at the IV site.  She was then placed on PO metoprolol.  Eliquis was initiated for anticoagulation, as it became clear that Miranda Taylor would require TEE and DCCV.  She remained in atrial flutter as we loaded eliquis and awaited 5 doses prior to DCCV.  On 4/1, she underwent TEE, which unfortunately showed a possible left atrial appendage thrombus.  As a result, DCCV was cancelled and recommendation was made for 4 wks of anticoagulation prior to DCCV.  Beta blocker dose was increased further and oral diltiazem was added and titrated.  She remained tachycardic and IV amiodarone was loaded without much effect.  She was seen by EP with recommendation for ongoing rate control.  It was not felt to be safe to escalate amiodarone dosing, as we  did not want her to convert to sinus rhythm in the setting of possible LAA thrombus.  Rates remain in the 130's to 140's, however she is asymptomatic at rest.  She was seen by EP this morning with recommendation for discharge and early follow-up and subsequent repeat TEE and either DCCV or catheter ablation in approximately 4 wks.  Discharge Vitals Blood  pressure 112/76, pulse 136, temperature 98.1 F (36.7 C), temperature source Oral, resp. rate 18, height 5\' 7"  (1.702 m), weight 183 lb 6.8 oz (83.2 kg), last menstrual period 12/25/2012, SpO2 97 %.  Filed Weights   02/09/15 0529 02/10/15 0620 02/11/15 0451  Weight: 185 lb 9.6 oz (84.188 kg) 183 lb 10.3 oz (83.3 kg) 183 lb 6.8 oz (83.2 kg)   Labs  CBC Lab Results  Component Value Date   WBC 8.5 02/04/2015   HGB 11.9* 02/04/2015   HCT 37.9 02/04/2015   MCV 89.2 02/04/2015   PLT 248 65/99/3570    Basic Metabolic Panel  Recent Labs  02/09/15 0530  NA 140  K 4.4  CL 103  CO2 28  GLUCOSE 101*  BUN 12  CREATININE 0.91  CALCIUM 9.7   Lab Results  Component Value Date   TSH 3.081 02/03/2015   Lab Results  Component Value Date   TROPONINI <0.03 02/04/2015     Disposition  Pt is being discharged home today in good condition.  Follow-up Plans & Appointments  Follow-up Information    Follow up with Miranda Harp, MD On 04/17/2015.   Specialty:  Cardiology   Why:  10 AM   Contact information:   [address] [place] [address] [phone]       Follow up with Miranda Peru, MD On 02/24/2015.   Specialty:  Cardiology   Why:  4:30 PM   Contact information:   [address] [place] [address] [phone]      Discharge Medications    Medication List    STOP taking these medications        flecainide 50 MG tablet  Commonly known as:  TAMBOCOR      TAKE these medications        apixaban 5 MG Tabs tablet  Commonly known as:  ELIQUIS  Take 1 tablet (5 mg total) by mouth 2 (two) times daily.     desvenlafaxine 50 MG 24 hr tablet  Commonly known as:  PRISTIQ  Take 50 mg by mouth every evening.     diltiazem 240 MG 24 hr capsule  Commonly known as:  CARDIZEM CD  Take 1 capsule (240 mg total) by mouth daily.     doxycycline 100 MG tablet  Commonly known as:  VIBRA-TABS  Take 1 tablet (100 mg total) by mouth  every 12 (twelve) hours.     LORazepam 0.5 MG tablet  Commonly known as:  ATIVAN  Take 0.5 mg by mouth at bedtime. For sleep     metoprolol 100 MG tablet  Commonly known as:  LOPRESSOR  Take 1 tablet (100 mg total) by mouth 2 (two) times daily.     multivitamin with minerals Tabs tablet  Take 1 tablet by mouth daily.     NEXIUM 40 MG capsule  Generic drug:  esomeprazole  Take 40 mg by mouth as needed (for heartburn).     oxyCODONE 15 MG immediate release tablet  Commonly known as:  ROXICODONE  Take 1 tablet (15 mg total) by mouth every 4 (four)  hours as needed. For pain     rizatriptan 10 MG disintegrating tablet  Commonly known as:  MAXALT-MLT  Take 10 mg by mouth as needed for migraine.       Outstanding Labs/Studies  None  Duration of Discharge Encounter   Greater than 30 minutes including physician time.  Signed, Murray Hodgkins NP 02/11/2015, 11:32 AM

## 2015-02-11 NOTE — Progress Notes (Signed)
Patient ID: KAITLAN BIN, female   DOB: January 10, 1961, 54 y.o.   MRN: [id number]    Patient Name: Miranda Taylor Date of Encounter: 02/11/2015     Principal Problem:   Atrial flutter with rapid ventricular response Active Problems:   Fibromyalgia   Major depressive disorder, recurrent episode, moderate   PAF (paroxysmal atrial fibrillation)    SUBJECTIVE  No chest pain or sob. Remains in atrial flutter with an RVR  CURRENT MEDS . apixaban  5 mg Oral BID  . diltiazem  240 mg Oral Daily  . doxycycline  100 mg Oral Q12H  . LORazepam  0.5 mg Oral QHS  . metoprolol  50 mg Oral 4 times per day  . multivitamin with minerals  1 tablet Oral Daily  . sodium chloride  3 mL Intravenous Q12H  . venlafaxine XR  75 mg Oral QPM    OBJECTIVE  Filed Vitals:   02/10/15 1804 02/10/15 1805 02/10/15 2100 02/11/15 0451  BP: 105/79 105/79 114/65 112/76  Pulse:   141 136  Temp:   98.1 F (36.7 C) 98.1 F (36.7 C)  TempSrc:   Oral Oral  Resp:   18 18  Height:      Weight:    183 lb 6.8 oz (83.2 kg)  SpO2:   96% 97%    Intake/Output Summary (Last 24 hours) at 02/11/15 0834 Last data filed at 02/10/15 1006  Gross per 24 hour  Intake      3 ml  Output      0 ml  Net      3 ml   Filed Weights   02/09/15 0529 02/10/15 0620 02/11/15 0451  Weight: 185 lb 9.6 oz (84.188 kg) 183 lb 10.3 oz (83.3 kg) 183 lb 6.8 oz (83.2 kg)    PHYSICAL EXAM  General: Pleasant, middle aged woman, NAD. Neuro: Alert and oriented X 3. Moves all extremities spontaneously. Psych: Normal affect. HEENT:  Normal  Neck: Supple without bruits or JVD. Lungs:  Resp regular and unlabored, CTA. Heart: RRR no s3, s4, or murmurs. Abdomen: Soft, non-tender, non-distended, BS + x 4.  Extremities: No clubbing, cyanosis or edema. DP/PT/Radials 2+ and equal bilaterally.  Accessory Clinical Findings  CBC No results for input(s): WBC, NEUTROABS, HGB, HCT, MCV, PLT in the last 72 hours. Basic Metabolic Panel  Recent  Labs  02/09/15 0530  NA 140  K 4.4  CL 103  CO2 28  GLUCOSE 101*  BUN 12  CREATININE 0.91  CALCIUM 9.7   Liver Function Tests No results for input(s): AST, ALT, ALKPHOS, BILITOT, PROT, ALBUMIN in the last 72 hours. No results for input(s): LIPASE, AMYLASE in the last 72 hours. Cardiac Enzymes No results for input(s): CKTOTAL, CKMB, CKMBINDEX, TROPONINI in the last 72 hours. BNP Invalid input(s): POCBNP D-Dimer No results for input(s): DDIMER in the last 72 hours. Hemoglobin A1C No results for input(s): HGBA1C in the last 72 hours. Fasting Lipid Panel No results for input(s): CHOL, HDL, LDLCALC, TRIG, CHOLHDL, LDLDIRECT in the last 72 hours. Thyroid Function Tests No results for input(s): TSH, T4TOTAL, T3FREE, THYROIDAB in the last 72 hours.  Invalid input(s): FREET3  TELE  Atrial flutter with 2:1 AV conduction  Radiology/Studies  Dg Chest 2 View  02/03/2015   CLINICAL DATA:  Weakness and fatigue. Sinus infection. Patient has atrial flutter with heart rate 150.  EXAM: CHEST  2 VIEW  COMPARISON:  04/02/2012.  FINDINGS: Cardiac silhouette within normal limits. Mild vascular congestion could  represent early failure or bronchitis. Trace BILATERAL pleural effusions. Normal mediastinal contour. No osseous findings.  IMPRESSION: Normal heart size with mild vascular congestion could represent early failure or bronchitis. Trace BILATERAL pleural effusions. Worsening aeration from priors.   Electronically Signed   By: Rolla Flatten M.D.   On: 02/03/2015 15:27    ASSESSMENT AND PLAN  1. Atrial flutter with an RVR 2. 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 As she is asymptomatic at rest, she may be discharged home on Eliquis twice daily, Metoprolol 100 bid, cardizem 240 daily and be scheduled to followup with me in 2-3 weeks. Will plan either DCCV or catheter ablation after 4 weeks  of systemic anti-coagulation. She is instructed not to do any vigorous physical activity.   Gregg Taylor,M.D.  02/11/2015 8:34 AM

## 2015-02-11 NOTE — Discharge Instructions (Signed)
***  PLEASE REMEMBER TO BRING ALL OF YOUR MEDICATIONS TO EACH OF YOUR FOLLOW-UP OFFICE VISITS.  

## 2015-02-24 ENCOUNTER — Other Ambulatory Visit: Payer: Self-pay

## 2015-02-24 ENCOUNTER — Ambulatory Visit (INDEPENDENT_AMBULATORY_CARE_PROVIDER_SITE_OTHER): Payer: Medicare Other | Admitting: Internal Medicine

## 2015-02-24 ENCOUNTER — Encounter: Payer: Self-pay | Admitting: Internal Medicine

## 2015-02-24 VITALS — BP 96/72 | HR 129 | Ht 67.0 in | Wt 188.4 lb

## 2015-02-24 DIAGNOSIS — I480 Paroxysmal atrial fibrillation: Secondary | ICD-10-CM

## 2015-02-24 DIAGNOSIS — I4892 Unspecified atrial flutter: Secondary | ICD-10-CM

## 2015-02-24 DIAGNOSIS — I5032 Chronic diastolic (congestive) heart failure: Secondary | ICD-10-CM | POA: Insufficient documentation

## 2015-02-24 MED ORDER — AMIODARONE HCL 400 MG PO TABS: 400.0000 mg | ORAL_TABLET | Freq: Every day | ORAL | Status: DC

## 2015-02-24 NOTE — Assessment & Plan Note (Signed)
Her symptoms are class 2. Hopefully she will not progress before undergoing DCCV. She is also at risk for worsening heart failure and LV dysfunction. She is encouraged to maintain a low sodium diet.

## 2015-02-24 NOTE — Assessment & Plan Note (Signed)
She has now gone into atrial fib from atrial flutter. When she undergoes ablation, will plan to ablate her flutter as well.

## 2015-02-24 NOTE — Patient Instructions (Addendum)
Medication Instructions:  1) START Amiodarone 400 mg daily on 03/03/15 *Continue Eliquis twice a day as ordered  Labwork: NONE  Testing/Procedures: NONE  Follow-Up: Your physician recommends that you schedule a follow-up appointment: the week on May 16 th with Dr. Lovena Le (Re: medication change and possible cardioversion following week);  Will refer to Dr. Joylene Grapes after 5/16 appointment   Any Other Special Instructions Will Be Listed Below (If Applicable).

## 2015-02-24 NOTE — Assessment & Plan Note (Signed)
Her ventricular rate is increaseed and she now has atrial fibrillation. She has been on systemic anti-coagulation just over 2 weeks. I have recommended she start taking amiodarone in a week. She will continue her systemic anti-coagulation. I plan to cardiovert in approx. 4 weeks after starting amio 400 mg daily.

## 2015-02-24 NOTE — Progress Notes (Signed)
HPI [name] returns today for followup. She is a very pleasant 54year-old woman with paroxysmal atrial fibrillation and flutter and sinus bradycardia. In the interim, she has been in the hospital with atrial flutter and a rapid ventricular response. We also noted atrial fibrillation. She underwent transesophageal echo which demonstrated a left atrial appendage thrombus, and she was placed on systemic anticoagulation with apixaban. She has been stable in the interim but notes shortness of breath with any exertion. She does not have dyspnea at rest. She has not had syncope. She has been difficult to control with regard to her AV nodal blocking drugs.   sNo Known Allergies   Current Outpatient Prescriptions  Medication Sig Dispense Refill  . apixaban (ELIQUIS) 5 MG TABS tablet Take 1 tablet (5 mg total) by mouth 2 (two) times daily. 60 tablet 6  . desvenlafaxine (PRISTIQ) 50 MG 24 hr tablet Take 50 mg by mouth every evening.    . diltiazem (CARDIZEM CD) 240 MG 24 hr capsule Take 1 capsule (240 mg total) by mouth daily. 30 capsule 6  . doxycycline (VIBRA-TABS) 100 MG tablet Take 1 tablet (100 mg total) by mouth every 12 (twelve) hours. 5 tablet 0  . LORazepam (ATIVAN) 0.5 MG tablet Take 0.5 mg by mouth at bedtime. For sleep    . metoprolol tartrate (LOPRESSOR) 25 MG tablet Take 25 mg by mouth 2 (two) times daily.    . Multiple Vitamin (MULITIVITAMIN WITH MINERALS) TABS Take 1 tablet by mouth daily.    Marland Kitchen NEXIUM 40 MG capsule Take 40 mg by mouth daily as needed (for heartburn).     Marland Kitchen oxyCODONE (ROXICODONE) 15 MG immediate release tablet Take 1 tablet (15 mg total) by mouth every 4 (four) hours as needed. For pain (Patient taking differently: Take 7.5-15 mg by mouth every 4 (four) hours as needed for pain. For pain) 30 tablet 0  . rizatriptan (MAXALT-MLT) 10 MG disintegrating tablet Take 10 mg by mouth daily as needed for migraine.     Marland Kitchen zolpidem (AMBIEN) 5 MG tablet Take 5 mg by mouth at  bedtime as needed. sleep     No current facility-administered medications for this visit.     Past Medical History  Diagnosis Date  . GERD (gastroesophageal reflux disease)   . Chronic lower back pain   . Paroxysmal atrial fibrillation   . Migraines   . Spinal stenosis   . SUI (stress urinary incontinence, female)   . History of kidney stones   . TMJ (temporomandibular joint syndrome)     LEFT SIDE--  WEAR  MOUTH GUARD  . H/O hiatal hernia   . Plantar fasciitis     ROS:   All systems reviewed and negative except as noted in the HPI.   Past Surgical History  Procedure Laterality Date  . Anterior cervical decomp/discectomy fusion  04-08-2010    C4 -- C5  . Posterior fusion lumbar spine  09-16-2009    L4 -- L5  . Appendectomy  1984  . Transthoracic echocardiogram  04-03-2012    normal LVF/  ef 55-60%  . Cardiovascular stress test  04-27-2014   dr berry    normal perfusion study/  no ischemia/  ef 61%  . Lasik  1990's  . Bilateral thunb joint arthrodesis  left 2007/   right 2010  . Negative sleep study  04-15-2013  . Cystoscopy N/A 08/04/2014    Procedure: CYSTOSCOPY;  Surgeon: Ardis Hughs, MD;  Location: [facility];  Service: Urology;  Laterality: N/A;  . Pubovaginal sling N/A 08/04/2014    Procedure: BOSTON SCIENTIFIC RETRO-PUBIC MID URETHRAL SLING;  Surgeon: Ardis Hughs, MD;  Location: [facility];  Service: Urology;  Laterality: N/A;  . Tee without cardioversion N/A 02/06/2015    Procedure: TRANSESOPHAGEAL ECHOCARDIOGRAM (TEE);  Surgeon: Lelon Perla, MD;  Location: [facility] ENDOSCOPY;  Service: Cardiovascular;  Laterality: N/A;     Family History  Problem Relation Age of Onset  . Hypertension    . Heart attack Father     age 49 - passed away of MI, first diagnosed with heart disease in his 65s  . Heart disease Mother      History   Social History  . Marital Status: Legally Separated    Spouse Name: N/A  . Number of  Children: N/A  . Years of Education: N/A   Occupational History  . Disabled    Social History Main Topics  . Smoking status: Former Smoker -- 0.50 packs/day for 20 years    Types: Cigarettes    Quit date: 11/07/2001  . Smokeless tobacco: Never Used  . Alcohol Use: No  . Drug Use: No  . Sexual Activity: Not on file   Other Topics Concern  . Not on file   Social History Narrative   Pt lives with husband in [place].  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     BP 96/72 mmHg  Pulse 129  Ht 5\' 7"  (1.702 m)  Wt 188 lb 6.4 oz (85.458 kg)  BMI 29.50 kg/m2  LMP 12/25/2012  Physical Exam:  Stable but anxious appearing middle-aged woman,NAD HEENT: Unremarkable Neck:  7 cm JVD, no thyromegally Back:  No CVA tenderness Lungs:  Clear with no wheezes or rhonchi. Basilar rales are present. No increased work of breathing HEART:  IRegular tachycardic rhythm, no murmurs, no rubs, no clicks Abd:  soft, positive bowel sounds, no organomegally, no rebound, no guarding Ext:  2 plus pulses, no edema, no cyanosis, no clubbing Skin:  No rashes no nodules Neuro:  CN II through XII intact, motor grossly intact  EKG - atrial fibrillation with a rapid ventricular response  Assess/Plan:

## 2015-03-10 ENCOUNTER — Encounter: Payer: Self-pay | Admitting: Internal Medicine

## 2015-03-16 ENCOUNTER — Telehealth: Payer: Self-pay | Admitting: Internal Medicine

## 2015-03-16 NOTE — Telephone Encounter (Signed)
Spoke with patient and she says her legs are swelling( starts above the knee but feet are not) Her weight is stable since the hospital.  Worse by the end of day and goes down at night.  Should she start fluid pill in the morning?

## 2015-03-16 NOTE — Telephone Encounter (Signed)
New message     Patient calling   Pt c/o medication issue:  1. Name of Medication: unsure which medication it is , eliquis, amiodarone, cardizem, metoprolol    2. How are you currently taking this medication (dosage and times per day)? As prescribe   3. Are you having a reaction (difficulty breathing--STAT)? Legs are swelling to ankles.  Faces is swelling some too.    4. What is your medication issue? Discuss which medication is causing her to have swelling.

## 2015-03-17 MED ORDER — POTASSIUM CHLORIDE ER 10 MEQ PO TBCR: EXTENDED_RELEASE_TABLET | ORAL | Status: DC

## 2015-03-17 MED ORDER — FUROSEMIDE 40 MG PO TABS: ORAL_TABLET | ORAL | Status: DC

## 2015-03-17 NOTE — Telephone Encounter (Signed)
Discussed with Dr Lovena Le, will start prn Furosemide 40 mg and Potassium 10 meq as needed for leg swelling.  She will call if this does not help

## 2015-03-23 ENCOUNTER — Ambulatory Visit: Payer: Medicare Other | Admitting: Podiatry

## 2015-03-23 ENCOUNTER — Ambulatory Visit (INDEPENDENT_AMBULATORY_CARE_PROVIDER_SITE_OTHER): Payer: Medicare Other | Admitting: Podiatry

## 2015-03-23 DIAGNOSIS — M722 Plantar fascial fibromatosis: Secondary | ICD-10-CM

## 2015-03-23 MED ORDER — TRIAMCINOLONE ACETONIDE 10 MG/ML IJ SUSP
10.0000 mg | Freq: Once | INTRAMUSCULAR | Status: AC
  Administered 2015-03-23: 10 mg

## 2015-03-24 NOTE — Progress Notes (Signed)
Subjective:     Patient ID: Miranda Taylor, female   DOB: Sep 08, 1961, 54 y.o.   MRN: [id number]  HPI patient no she needs surgery on her right heel but is unable to do currently and that like an injection to try to help her   Review of Systems     Objective:   Physical Exam Neurovascular status intact with discomfort in the medial aspect of the right plantar heel still present    Assessment:     Plantar fasciitis right still present    Plan:     Reinjected the plantar fascia 3 mg Kenalog 5 mg Xylocaine and instructed on reduced activity until we'll be able to do surgery on this patient

## 2015-03-31 ENCOUNTER — Ambulatory Visit (INDEPENDENT_AMBULATORY_CARE_PROVIDER_SITE_OTHER): Payer: Medicare Other | Admitting: Internal Medicine

## 2015-03-31 ENCOUNTER — Encounter: Payer: Self-pay | Admitting: Internal Medicine

## 2015-03-31 ENCOUNTER — Telehealth: Payer: Self-pay | Admitting: Internal Medicine

## 2015-03-31 VITALS — BP 120/76 | HR 53 | Ht 67.0 in | Wt 189.4 lb

## 2015-03-31 DIAGNOSIS — I4891 Unspecified atrial fibrillation: Secondary | ICD-10-CM | POA: Diagnosis not present

## 2015-03-31 DIAGNOSIS — E663 Overweight: Secondary | ICD-10-CM | POA: Insufficient documentation

## 2015-03-31 DIAGNOSIS — I480 Paroxysmal atrial fibrillation: Secondary | ICD-10-CM

## 2015-03-31 DIAGNOSIS — I5032 Chronic diastolic (congestive) heart failure: Secondary | ICD-10-CM

## 2015-03-31 DIAGNOSIS — I4892 Unspecified atrial flutter: Secondary | ICD-10-CM | POA: Diagnosis not present

## 2015-03-31 NOTE — Assessment & Plan Note (Signed)
Will plan to address the importance of weight loss.

## 2015-03-31 NOTE — Telephone Encounter (Signed)
Spoke with pt and she states that she has not scheduled her foot surgery yet but wanted to know if Dr. Lovena Le thought she should hold off on the ablation until after the foot surgery or if he thought it would be ok to go ahead and schedule the foot surgery prior to seeing Dr. Rayann Heman and having the ablation. Will forward to Dr. Lovena Le and his nurse for review and follow up.

## 2015-03-31 NOTE — Telephone Encounter (Signed)
Left message to call back  

## 2015-03-31 NOTE — Assessment & Plan Note (Signed)
She is class 1 in NSR. Will follow.

## 2015-03-31 NOTE — Progress Notes (Signed)
HPI [name]- Miranda Taylor returns today for followup. She is a very pleasant 54 year-old woman with paroxysmal atrial fibrillation and flutter and sinus bradycardia. In the interim, she has been in the hospital with atrial flutter and a rapid ventricular response. She underwent transesophageal echo which demonstrated a left atrial appendage thrombus, and she was placed on systemic anticoagulation with apixaban.  She has been difficult to control with regard to her AV nodal blocking drugs. She was placed on amiodarone and has reverted to NSR. She feels better.   sNo Known Allergies   Current Outpatient Prescriptions  Medication Sig Dispense Refill  . amiodarone (PACERONE) 400 MG tablet Take 1 tablet (400 mg total) by mouth daily. 30 tablet 11  . apixaban (ELIQUIS) 5 MG TABS tablet Take 1 tablet (5 mg total) by mouth 2 (two) times daily. 60 tablet 6  . desvenlafaxine (PRISTIQ) 50 MG 24 hr tablet Take 50 mg by mouth every evening.    . diltiazem (CARDIZEM CD) 240 MG 24 hr capsule Take 1 capsule (240 mg total) by mouth daily. 30 capsule 6  . furosemide (LASIX) 40 MG tablet Take one tablet by mouth daily as needed for swelling 30 tablet 3  . LORazepam (ATIVAN) 0.5 MG tablet Take 0.5 mg by mouth at bedtime. For sleep    . metoprolol tartrate (LOPRESSOR) 25 MG tablet Take 25 mg by mouth 2 (two) times daily.    . Multiple Vitamin (MULITIVITAMIN WITH MINERALS) TABS Take 1 tablet by mouth daily.    Marland Kitchen NEXIUM 40 MG capsule Take 40 mg by mouth daily as needed (for heartburn).     Marland Kitchen oxyCODONE (ROXICODONE) 15 MG immediate release tablet Take 1 tablet (15 mg total) by mouth every 4 (four) hours as needed. For pain (Patient taking differently: Take 7.5-15 mg by mouth every 4 (four) hours as needed for pain. For pain) 30 tablet 0  . potassium chloride (K-DUR) 10 MEQ tablet Take one tablet by mouth daily when taking the fluid pill for swelling 30 tablet 3  . rizatriptan (MAXALT-MLT) 10 MG disintegrating tablet Take 10  mg by mouth daily as needed for migraine.     Marland Kitchen zolpidem (AMBIEN) 5 MG tablet Take 5 mg by mouth at bedtime as needed. sleep     No current facility-administered medications for this visit.     Past Medical History  Diagnosis Date  . GERD (gastroesophageal reflux disease)   . Chronic lower back pain   . Paroxysmal atrial fibrillation   . Migraines   . Spinal stenosis   . SUI (stress urinary incontinence, female)   . History of kidney stones   . TMJ (temporomandibular joint syndrome)     LEFT SIDE--  WEAR  MOUTH GUARD  . H/O hiatal hernia   . Plantar fasciitis     ROS:   All systems reviewed and negative except as noted in the HPI.   Past Surgical History  Procedure Laterality Date  . Anterior cervical decomp/discectomy fusion  04-08-2010    C4 -- C5  . Posterior fusion lumbar spine  09-16-2009    L4 -- L5  . Appendectomy  1984  . Transthoracic echocardiogram  04-03-2012    normal LVF/  ef 55-60%  . Cardiovascular stress test  04-27-2014   dr berry    normal perfusion study/  no ischemia/  ef 61%  . Lasik  1990's  . Bilateral thunb joint arthrodesis  left 2007/   right 2010  . Negative sleep study  04-15-2013  . Cystoscopy N/A 08/04/2014    Procedure: CYSTOSCOPY;  Surgeon: Ardis Hughs, MD;  Location: [facility];  Service: Urology;  Laterality: N/A;  . Pubovaginal sling N/A 08/04/2014    Procedure: BOSTON SCIENTIFIC RETRO-PUBIC MID URETHRAL SLING;  Surgeon: Ardis Hughs, MD;  Location: [facility];  Service: Urology;  Laterality: N/A;  . Tee without cardioversion N/A 02/06/2015    Procedure: TRANSESOPHAGEAL ECHOCARDIOGRAM (TEE);  Surgeon: Lelon Perla, MD;  Location[facility] ENDOSCOPY;  Service: Cardiovascular;  Laterality: N/A;     Family History  Problem Relation Age of Onset  . Hypertension    . Heart attack Father     age 56 - passed away of MI, first diagnosed with heart disease in his 79s  . Heart disease Mother       History   Social History  . Marital Status: Legally Separated    Spouse Name: N/A  . Number of Children: N/A  . Years of Education: N/A   Occupational History  . Disabled    Social History Main Topics  . Smoking status: Former Smoker -- 0.50 packs/day for 20 years    Types: Cigarettes    Quit date: 11/07/2001  . Smokeless tobacco: Never Used  . Alcohol Use: No  . Drug Use: No  . Sexual Activity: Not on file   Other Topics Concern  . Not on file   Social History Narrative   Pt lives with husband in [place].  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     BP 120/76 mmHg  Pulse 53  Ht 5\' 7"  (1.702 m)  Wt 189 lb 6.4 oz (85.911 kg)  BMI 29.66 kg/m2  LMP 12/25/2012  Physical Exam:  Stable but anxious appearing middle-aged woman,NAD HEENT: Unremarkable Neck:  7 cm JVD, no thyromegally Back:  No CVA tenderness Lungs:  Clear with no wheezes or rhonchi. Basilar rales are present. No increased work of breathing HEART:  IRegular tachycardic rhythm, no murmurs, no rubs, no clicks Abd:  soft, positive bowel sounds, no organomegally, no rebound, no guarding Ext:  2 plus pulses, no edema, no cyanosis, no clubbing Skin:  No rashes no nodules Neuro:  CN II through XII intact, motor grossly intact  EKG - nsr  Assess/Plan:

## 2015-03-31 NOTE — Assessment & Plan Note (Signed)
She has reverted to NSR. She will continue amiodarone and anti-coagulation with Eliquis. I discussed catheter ablation of atrial flutter. She has also had atrial fibrillation. She will continue her current meds. I'll plan to refer the patient to Dr. Rayann Heman for both a fib and flutter ablation.

## 2015-03-31 NOTE — Telephone Encounter (Signed)
New Message   Patient is calling bc she is referred to Dr. Rayann Heman  For a-flutter and she is suppose to have foot surgery and needs to know if she should wait to have the ablation until after the surgery. Please give patient a call.

## 2015-03-31 NOTE — Patient Instructions (Signed)
Medication Instructions:  Your physician recommends that you continue on your current medications as directed. Please refer to the Current Medication list given to you today.   Labwork: NONE  Testing/Procedures: NONE  Follow-Up: Your physician recommends that you schedule a follow-up on 04/09/2015 with Dr. Rayann Heman to discuss A. Fib/A. Flutter Ablation.  Any Other Special Instructions Will Be Listed Below (If Applicable).

## 2015-03-31 NOTE — Assessment & Plan Note (Signed)
She is maintaining NSR. Because she has had both atrial fib and flutter and is now back in NSR. I think referral to consider catheter ablation of atrial fib and flutter is reasonable. She would like to have both problems addressed with ablation at once and is willing to take the additional risk of both procedures.

## 2015-04-01 ENCOUNTER — Ambulatory Visit: Payer: Self-pay | Admitting: Internal Medicine

## 2015-04-01 NOTE — Telephone Encounter (Signed)
lmom for patient to keep her appointment on 04/09/15 with Dr Rayann Heman.  Can discuss the ablation with Dr Rayann Heman at that time.  i would go ahead and schedule the foot surgery prior as the Eliquis will not be able to be held post ablation.  I let her know she could call back with questions otherwise

## 2015-04-03 ENCOUNTER — Telehealth: Payer: Self-pay | Admitting: *Deleted

## 2015-04-03 NOTE — Telephone Encounter (Signed)
"  Supposed to have surgery with Dr. Paulla Dolly.  Can you tell me how far out his surgery dates are?  Give me a call back."    I left patient a message to call me back.  I'm returning your call.

## 2015-04-07 ENCOUNTER — Encounter: Payer: Self-pay | Admitting: *Deleted

## 2015-04-09 ENCOUNTER — Encounter (HOSPITAL_COMMUNITY): Payer: Self-pay

## 2015-04-09 ENCOUNTER — Ambulatory Visit (HOSPITAL_COMMUNITY): Admit: 2015-04-09 | Payer: Self-pay | Admitting: Internal Medicine

## 2015-04-09 ENCOUNTER — Institutional Professional Consult (permissible substitution): Payer: Self-pay | Admitting: Internal Medicine

## 2015-04-09 SURGERY — A-FLUTTER/A-TACH/SVT ABLATION
Anesthesia: LOCAL

## 2015-04-10 ENCOUNTER — Ambulatory Visit (INDEPENDENT_AMBULATORY_CARE_PROVIDER_SITE_OTHER): Payer: BLUE CROSS/BLUE SHIELD | Admitting: Internal Medicine

## 2015-04-10 ENCOUNTER — Encounter: Payer: Self-pay | Admitting: *Deleted

## 2015-04-10 ENCOUNTER — Encounter: Payer: Self-pay | Admitting: Internal Medicine

## 2015-04-10 VITALS — BP 102/64 | HR 39 | Ht 67.0 in | Wt 190.0 lb

## 2015-04-10 DIAGNOSIS — I480 Paroxysmal atrial fibrillation: Secondary | ICD-10-CM

## 2015-04-10 DIAGNOSIS — E663 Overweight: Secondary | ICD-10-CM

## 2015-04-10 DIAGNOSIS — I4892 Unspecified atrial flutter: Secondary | ICD-10-CM

## 2015-04-10 MED ORDER — AMIODARONE HCL 200 MG PO TABS: 200.0000 mg | ORAL_TABLET | Freq: Every day | ORAL | Status: DC

## 2015-04-10 NOTE — Addendum Note (Signed)
Addended by: Alvis Lemmings C on: 04/10/2015 10:03 AM   Modules accepted: Orders, Medications

## 2015-04-10 NOTE — Progress Notes (Signed)
Electrophysiology Office Note   Date:  04/10/2015   ID:  Miranda Taylor, DOB 30-Aug-1961, MRN [id number]  PCP:  Tula Nakayama  Cardiologist:  Dr Gwenlyn Found Primary Electrophysiologist: Dr Lovena Le  Chief Complaint  Patient presents with  . Atrial Fibrillation     History of Present Illness: Miranda Taylor is a 54 y.o. female who presents today for electrophysiology evaluation.   She has a long history of atrial fibrillation and atrial flutter.  She has failed medical therapy with flecainide.  She reports increasing frequency and duration of atrial arrhythmias.  She presented with atrial flutter with RVR 4/16 and symptoms of CHF.  She was noted to have reduced EF and also a LAA thrombus.  She was treated with eliquis and subsequently cardioverted with amiodarone.  She feels "better" in normal rhythm.  Energy is improved.  Exercise tolerance is also increased.  With medical therapy she is quite bradycardic and feels that she gets "tired" very easily.  She has had some swelling with diltiazem.  Today, she denies symptoms of palpitations, chest pain  orthopnea, PND,  claudication, dizziness, presyncope, syncope, bleeding, or neurologic sequela. The patient is tolerating medications without difficulties and is otherwise without complaint today.    Past Medical History  Diagnosis Date  . GERD (gastroesophageal reflux disease)   . Chronic lower back pain   . Paroxysmal atrial fibrillation   . Migraines   . Spinal stenosis   . SUI (stress urinary incontinence, female)   . History of kidney stones   . TMJ (temporomandibular joint syndrome)     LEFT SIDE--  WEAR  MOUTH GUARD  . H/O hiatal hernia   . Plantar fasciitis   . Typical atrial flutter   . 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 Anterior cervical decomp/discectomy fusion  04-08-2010    C4 -- C5  . Posterior fusion lumbar spine  09-16-2009    L4 -- L5  . Appendectomy   1984  . Transthoracic echocardiogram  04-03-2012    normal LVF/  ef 55-60%  . Cardiovascular stress test  04-27-2014   dr berry    normal perfusion study/  no ischemia/  ef 61%  . Lasik  1990's  . Bilateral thunb joint arthrodesis  left 2007/   right 2010  . Negative sleep study  04-15-2013  . Cystoscopy N/A 08/04/2014    Procedure: CYSTOSCOPY;  Surgeon: Ardis Hughs, MD;  Location: [facility];  Service: Urology;  Laterality: N/A;  . Pubovaginal sling N/A 08/04/2014    Procedure: BOSTON SCIENTIFIC RETRO-PUBIC MID URETHRAL SLING;  Surgeon: Ardis Hughs, MD;  Location: [facility];  Service: Urology;  Laterality: N/A;  . Tee without cardioversion N/A 02/06/2015    Procedure: TRANSESOPHAGEAL ECHOCARDIOGRAM (TEE);  Surgeon: Lelon Perla, MD;  Location: [facility] ENDOSCOPY;  Service: Cardiovascular;  Laterality: N/A;     Current Outpatient Prescriptions  Medication Sig Dispense Refill  . amiodarone (PACERONE) 400 MG tablet Take 1 tablet (400 mg total) by mouth daily. 30 tablet 11  . apixaban (ELIQUIS) 5 MG TABS tablet Take 1 tablet (5 mg total) by mouth 2 (two) times daily. 60 tablet 6  . desvenlafaxine (PRISTIQ) 50 MG 24 hr tablet Take 50 mg by mouth every other day.     . diltiazem (CARDIZEM CD) 240 MG 24 hr capsule Take 1 capsule (240 mg total) by mouth daily. 30 capsule 6  .  furosemide (LASIX) 40 MG tablet Take one tablet by mouth daily as needed for swelling 30 tablet 3  . LORazepam (ATIVAN) 0.5 MG tablet Take 0.5 mg by mouth at bedtime. For sleep    . metoprolol tartrate (LOPRESSOR) 25 MG tablet Take 25 mg by mouth 2 (two) times daily.    . Multiple Vitamin (MULITIVITAMIN WITH MINERALS) TABS Take 1 tablet by mouth daily.    Marland Kitchen NEXIUM 40 MG capsule Take 40 mg by mouth daily as needed (for heartburn).     Marland Kitchen oxyCODONE (ROXICODONE) 15 MG immediate release tablet Take 1 tablet (15 mg total) by mouth every 4 (four) hours as needed. For pain (Patient taking  differently: Take 7.5-15 mg by mouth every 4 (four) hours as needed for pain. For pain) 30 tablet 0  . potassium chloride (K-DUR) 10 MEQ tablet Take one tablet by mouth daily when taking the fluid pill for swelling 30 tablet 3  . rizatriptan (MAXALT-MLT) 10 MG disintegrating tablet Take 10 mg by mouth daily as needed for migraine.     Marland Kitchen zolpidem (AMBIEN) 5 MG tablet Take 5 mg by mouth at bedtime as needed. sleep     No current facility-administered medications for this visit.    Allergies:   Review of patient's allergies indicates no known allergies.   Social History:  The patient  reports that she quit smoking about 13 years ago. Her smoking use included Cigarettes. She has a 10 pack-year smoking history. She has never used smokeless tobacco. She reports that she does not drink alcohol or use illicit drugs.   Family History:  The patient's  family history includes Arrhythmia in her father; CAD (age of onset: 54) in her father; Heart attack (age of onset: 43) in her father; Heart disease in her mother; Hypertension in an other family member.    ROS:  Please see the history of present illness.   All other systems are reviewed and negative.    PHYSICAL EXAM: VS:  BP 102/64 mmHg  Pulse 39  Ht 5\' 7"  (1.702 m)  Wt 86.183 kg (190 lb)  BMI 29.75 kg/m2  LMP 12/25/2012 , BMI Body mass index is 29.75 kg/(m^2). GEN: Well nourished, well developed, in no acute distress HEENT: normal Neck: no JVD, carotid bruits, or masses Cardiac: bradycardic regular rhythm; no murmurs, rubs, or gallops,no edema  Respiratory:  clear to auscultation bilaterally, normal work of breathing GI: soft, nontender, nondistended, + BS MS: no deformity or atrophy Skin: warm and dry  Neuro:  Strength and sensation are intact Psych: euthymic mood, full affect  EKG:  EKG is ordered today. The ekg ordered today shows sinus bradycardia 39 bpm   Recent Labs: 02/03/2015: B Natriuretic Peptide 366.7*; TSH 3.081 02/04/2015:  ALT 51*; Hemoglobin 11.9*; Platelets 248 02/05/2015: Magnesium 2.1 02/09/2015: BUN 12; Creatinine 0.91; Potassium 4.4; Sodium 140    Lipid Panel     Component Value Date/Time   CHOL 180 10/15/2014 0939   TRIG 58 10/15/2014 0939   HDL 62 10/15/2014 0939   CHOLHDL 2.9 10/15/2014 0939   VLDL 12 10/15/2014 0939   LDLCALC 106* 10/15/2014 0939     Wt Readings from Last 3 Encounters:  04/10/15 86.183 kg (190 lb)  03/31/15 85.911 kg (189 lb 6.4 oz)  02/24/15 85.458 kg (188 lb 6.4 oz)      Other studies Reviewed: Additional studies/ records that were reviewed today include: Dr Forde Dandy notes, recent hospital records, TEE    ASSESSMENT AND PLAN:  1.  Paroxysmal atrial fibrillation/ atrial flutter The patient has symptomatic afib/ atrial flutter and has failed medical therapy with flecainide.  Chads2vasc score is at least 2 and she has had a prior LAA thrombus. Therapeutic strategies for afib including medicine and ablation were discussed in detail with the patient today. Risk, benefits, and alternatives to EP study and radiofrequency ablation for afib were also discussed in detail today. These risks include but are not limited to stroke, bleeding, vascular damage, tamponade, perforation, damage to the esophagus, lungs, and other structures, pulmonary vein stenosis, worsening renal function, and death. The patient understands these risk and wishes to proceed.  We will therefore proceed with catheter ablation at the next available time.  She will require TEE prior to the procedure. Decrease amiodarone to 200mg  daily  2. Bradycardia Stop diltiazem Reduced amiodarone as above  3. Chronic systolic dysfunction Should improve with sinus rhythm Stop diltiazem Current medicines are reviewed at length with the patient today.   The patient does not have concerns regarding her medicines.  The following changes were made today:  none  Signed, Thompson Grayer, MD  04/10/2015 9:33 AM     [facility]  [facility] [address] [place] [address] [phone] (office) [phone] (fax)

## 2015-04-10 NOTE — Patient Instructions (Signed)
Medication Instructions: - Decrease amiodarone to 200 mg once daily - Stop dilitiazem  Labwork: - Your physician recommends that you return for lab work: 05/08/15   Procedures/Testing: - Your physician has recommended that you have an ablation. Catheter ablation is a medical procedure used to treat some cardiac arrhythmias (irregular heartbeats). During catheter ablation, a long, thin, flexible tube is put into a blood vessel in your groin (upper thigh), or neck. This tube is called an ablation catheter. It is then guided to your heart through the blood vessel. Radio frequency waves destroy small areas of heart tissue where abnormal heartbeats may cause an arrhythmia to start. Please see the instruction sheet given to you today.  - Your physician has requested that you have a TEE. During a TEE, sound waves are used to create images of your heart. It provides your doctor with information about the size and shape of your heart and how well your heart's chambers and valves are working. In this test, a transducer is attached to the end of a flexible tube that's guided down your throat and into your esophagus (the tube leading from you mouth to your stomach) to get a more detailed image of your heart. You are not awake for the procedure. Please see the instruction sheet given to you today. For further information please visit [email or web address].  Follow-Up: - Your physician recommends that you schedule a follow-up appointment in: 4 weeks with Roderic Palau, NP (a-fib clinic)   Any Additional Special Instructions Will Be Listed Below (If Applicable). - none

## 2015-04-17 ENCOUNTER — Ambulatory Visit (INDEPENDENT_AMBULATORY_CARE_PROVIDER_SITE_OTHER): Payer: Medicare Other | Admitting: Cardiovascular Disease

## 2015-04-17 ENCOUNTER — Encounter: Payer: Self-pay | Admitting: Cardiovascular Disease

## 2015-04-17 VITALS — BP 102/68 | HR 64 | Ht 67.0 in | Wt 188.0 lb

## 2015-04-17 DIAGNOSIS — I4892 Unspecified atrial flutter: Secondary | ICD-10-CM

## 2015-04-17 NOTE — Assessment & Plan Note (Signed)
Miranda Taylor has a history of PAF in the past. She was recently admitted for a week with A. Fib/flutter with RVR. A transesophageal echo showed thrombus in her left atrial appendage performed by Dr. Stanford Breed with an EF of 35-40% and therefore she did not undergo cardioversion. She was placed on oral anticoagulation, rate controlled on placed on amiodarone. She ultimately converted to sinus rhythm and saw Dr. Rayann Heman in the office recently was scheduled her for a ablation procedure in the beginning of July. Her amnio dose was reduced and her diltiazem discontinued. She does have some mild peripheral edema. The thought was that her reduced ejection fraction was related to her arrhythmia.

## 2015-04-17 NOTE — Patient Instructions (Signed)
Your physician wants you to follow-up in: 1 year with Dr Berry. You will receive a reminder letter in the mail two months in advance. If you don't receive a letter, please call our office to schedule the follow-up appointment.  

## 2015-04-17 NOTE — Progress Notes (Signed)
04/17/2015 Miranda Taylor   Mar 12, 1961  [id number]  Primary Physician Bing Matter, PA-C Primary Cardiologist: Lorretta Harp MD Renae Gloss   HPI:   The patient is a delightful 54 year old mildly overweight married Caucasian female, mother of 22 and grandmother to 2 grandchildren, whom I last saw in the office 2 years  ago. She has a history of symptomatic paroxysmal atrial fibrillation with a low [place] score on beta-blocker and aspirin. She had a normal 2D echo and stress test. I did an event monitor that showed multiple episodes of PAF, which she is symptomatic from with complaints of shortness of breath and fatigue. I referred her to Dr. Thompson Grayer at [facility], Electrophysiologist, who placed her on flecainide 50 p.o. b.i.d. empirically. Her symptoms have improved.she had symptoms compatible with obstructive sleep apnea when I last saw her 2 years ago and a subsequent outpatient sleep study did not suggest this. She was recently admitted to the hospital 02/03/15 for one week with A. Fib with RVR. A transesophageal echo showed reduced systolic function with 25-42% range with left atrial smoke/thrombus and therefore cardioversion was not performed. She was placed on amiodarone and rate controlled and placed on oral anticoagulation. She subsequently converted as an outpatient and saw Dr. Rayann Heman in the office who has scheduled her for elective A. Fib fib/flutter ablation in the near future.  Current Outpatient Prescriptions  Medication Sig Dispense Refill  . amiodarone (PACERONE) 200 MG tablet Take 1 tablet (200 mg total) by mouth daily. 30 tablet 6  . apixaban (ELIQUIS) 5 MG TABS tablet Take 1 tablet (5 mg total) by mouth 2 (two) times daily. 60 tablet 6  . desvenlafaxine (PRISTIQ) 50 MG 24 hr tablet Take 50 mg by mouth every other day.     . furosemide (LASIX) 40 MG tablet Take one tablet by mouth daily as needed for swelling (Patient taking differently: as needed.  Take one tablet by mouth daily as needed for swelling) 30 tablet 3  . LORazepam (ATIVAN) 0.5 MG tablet Take 0.5 mg by mouth at bedtime. For sleep    . metoprolol tartrate (LOPRESSOR) 25 MG tablet Take 25 mg by mouth 2 (two) times daily.    . Multiple Vitamin (MULITIVITAMIN WITH MINERALS) TABS Take 1 tablet by mouth daily.    Marland Kitchen NEXIUM 40 MG capsule Take 40 mg by mouth daily as needed (for heartburn).     . nitrofurantoin, macrocrystal-monohydrate, (MACROBID) 100 MG capsule Take 100 mg by mouth.    . oxyCODONE (ROXICODONE) 15 MG immediate release tablet Take 1 tablet (15 mg total) by mouth every 4 (four) hours as needed. For pain (Patient taking differently: Take 7.5-15 mg by mouth every 4 (four) hours as needed for pain. For pain) 30 tablet 0  . potassium chloride (K-DUR) 10 MEQ tablet Take one tablet by mouth daily when taking the fluid pill for swelling 30 tablet 3  . rizatriptan (MAXALT-MLT) 10 MG disintegrating tablet Take 10 mg by mouth daily as needed for migraine.     Marland Kitchen zolpidem (AMBIEN) 5 MG tablet Take 5 mg by mouth at bedtime as needed. sleep     No current facility-administered medications for this visit.    No Known Allergies  History   Social History  . Marital Status: Legally Separated    Spouse Name: N/A  . Number of Children: N/A  . Years of Education: N/A   Occupational History  . Disabled    Social History Main Topics  .  Smoking status: Former Smoker -- 0.50 packs/day for 20 years    Types: Cigarettes    Quit date: 11/07/2001  . Smokeless tobacco: Never Used  . Alcohol Use: No  . Drug Use: No  . Sexual Activity: Not on file   Other Topics Concern  . Not on file   Social History Narrative   Pt lives with husband in [place].  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  Cardiovascular: negative for chest pain, dyspnea on  exertion, edema, orthopnea, palpitations, paroxysmal nocturnal dyspnea or shortness of breath Dermatological: negative for rash Respiratory: negative for cough or wheezing Urologic: negative for hematuria Abdominal: negative for nausea, vomiting, diarrhea, bright red blood per rectum, melena, or hematemesis Neurologic: negative for visual changes, syncope, or dizziness All other systems reviewed and are otherwise negative except as noted above.    Blood pressure 102/68, pulse 64, height 5\' 7"  (1.702 m), weight 188 lb (85.276 kg), last menstrual period 12/25/2012.  General appearance: alert and no distress Neck: no adenopathy, no carotid bruit, no JVD, supple, symmetrical, trachea midline and thyroid not enlarged, symmetric, no tenderness/mass/nodules Lungs: clear to auscultation bilaterally Heart: regular rate and rhythm, S1, S2 normal, no murmur, click, rub or gallop Extremities: extremities normal, atraumatic, no cyanosis or edema  EKG not performed today  ASSESSMENT AND PLAN:   Atrial flutter with rapid ventricular response Mrs. Morrisette has a history of PAF in the past. She was recently admitted for a week with A. Fib/flutter with RVR. A transesophageal echo showed thrombus in her left atrial appendage performed by Dr. Stanford Breed with an EF of 35-40% and therefore she did not undergo cardioversion. She was placed on oral anticoagulation, rate controlled on placed on amiodarone. She ultimately converted to sinus rhythm and saw Dr. Rayann Heman in the office recently was scheduled her for a ablation procedure in the beginning of July. Her amnio dose was reduced and her diltiazem discontinued. She does have some mild peripheral edema. The thought was that her reduced ejection fraction was related to her arrhythmia.      Lorretta Harp MD FACP,FACC,FAHA, [facility] 04/17/2015 10:31 AM

## 2015-05-04 ENCOUNTER — Other Ambulatory Visit: Payer: Self-pay | Admitting: Nurse Practitioner

## 2015-05-07 ENCOUNTER — Other Ambulatory Visit: Payer: Self-pay

## 2015-05-07 DIAGNOSIS — I480 Paroxysmal atrial fibrillation: Secondary | ICD-10-CM

## 2015-05-07 MED ORDER — METOPROLOL TARTRATE 25 MG PO TABS: 25.0000 mg | ORAL_TABLET | Freq: Two times a day (BID) | ORAL | Status: DC

## 2015-05-07 NOTE — Telephone Encounter (Signed)
Rx(s) sent to pharmacy electronically.  

## 2015-05-08 ENCOUNTER — Other Ambulatory Visit (INDEPENDENT_AMBULATORY_CARE_PROVIDER_SITE_OTHER): Payer: Medicare Other | Admitting: *Deleted

## 2015-05-08 ENCOUNTER — Other Ambulatory Visit: Payer: Self-pay

## 2015-05-08 ENCOUNTER — Telehealth: Payer: Self-pay | Admitting: Cardiovascular Disease

## 2015-05-08 DIAGNOSIS — I480 Paroxysmal atrial fibrillation: Secondary | ICD-10-CM

## 2015-05-08 LAB — CBC WITH DIFFERENTIAL/PLATELET
Basophils Absolute: 0 10*3/uL (ref 0.0–0.1)
Basophils Relative: 0.4 % (ref 0.0–3.0)
EOS ABS: 0.1 10*3/uL (ref 0.0–0.7)
Eosinophils Relative: 1.8 % (ref 0.0–5.0)
HEMATOCRIT: 38.6 % (ref 36.0–46.0)
Hemoglobin: 12.5 g/dL (ref 12.0–15.0)
Lymphocytes Relative: 35.6 % (ref 12.0–46.0)
Lymphs Abs: 2.4 10*3/uL (ref 0.7–4.0)
MCHC: 32.5 g/dL (ref 30.0–36.0)
MCV: 86.8 fl (ref 78.0–100.0)
Monocytes Absolute: 0.4 10*3/uL (ref 0.1–1.0)
Monocytes Relative: 5.9 % (ref 3.0–12.0)
NEUTROS ABS: 3.8 10*3/uL (ref 1.4–7.7)
Neutrophils Relative %: 56.3 % (ref 43.0–77.0)
PLATELETS: 204 10*3/uL (ref 150.0–400.0)
RBC: 4.45 Mil/uL (ref 3.87–5.11)
RDW: 14.1 % (ref 11.5–15.5)
WBC: 6.8 10*3/uL (ref 4.0–10.5)

## 2015-05-08 LAB — BASIC METABOLIC PANEL
BUN: 18 mg/dL (ref 6–23)
CO2: 28 mEq/L (ref 19–32)
CREATININE: 1 mg/dL (ref 0.40–1.20)
Calcium: 9.4 mg/dL (ref 8.4–10.5)
Chloride: 107 mEq/L (ref 96–112)
GFR: 61.43 mL/min (ref >60.00)
GLUCOSE: 76 mg/dL (ref 70–99)
Potassium: 4.1 mEq/L (ref 3.5–5.1)
SODIUM: 141 meq/L (ref 135–145)

## 2015-05-08 NOTE — Telephone Encounter (Signed)
April called in stating that 2 prescriptions were sent in yesterday for the pt. She said the first one was sent by dr. Gwenlyn Found for Lopressor 100mg  BID and the second was sent by Dr. Lovena Le for Lopressor 25mg  BID. She would like to know which one is correct. Please f/u with her   Thanks

## 2015-05-08 NOTE — Telephone Encounter (Signed)
Spoke with patient - she takes metoprolol tartrate 25mg  BID.   Called pharmacy to update them. Patient reports she informed pharmacy staff of this herself.   Med list updated.

## 2015-05-14 ENCOUNTER — Encounter (HOSPITAL_COMMUNITY): Payer: Self-pay | Admitting: *Deleted

## 2015-05-14 ENCOUNTER — Ambulatory Visit (HOSPITAL_COMMUNITY)
Admission: RE | Admit: 2015-05-14 | Discharge: 2015-05-14 | Disposition: A | Discharge status: Home / Self Care | Payer: Medicare Other | Source: Ambulatory Visit | Attending: Nurse Practitioner | Admitting: Nurse Practitioner

## 2015-05-14 ENCOUNTER — Encounter (HOSPITAL_COMMUNITY)
Admission: RE | Disposition: A | Discharge status: Home / Self Care | Payer: Self-pay | Source: Ambulatory Visit | Attending: Cardiovascular Disease

## 2015-05-14 ENCOUNTER — Ambulatory Visit (HOSPITAL_COMMUNITY)
Admission: RE | Admit: 2015-05-14 | Discharge: 2015-05-14 | Disposition: A | Discharge status: Home / Self Care | Payer: Medicare Other | Source: Ambulatory Visit | Attending: Cardiovascular Disease | Admitting: Cardiovascular Disease

## 2015-05-14 DIAGNOSIS — I480 Paroxysmal atrial fibrillation: Secondary | ICD-10-CM | POA: Insufficient documentation

## 2015-05-14 DIAGNOSIS — I4892 Unspecified atrial flutter: Secondary | ICD-10-CM | POA: Diagnosis not present

## 2015-05-14 DIAGNOSIS — Z6828 Body mass index (BMI) 28.0-28.9, adult: Secondary | ICD-10-CM | POA: Diagnosis not present

## 2015-05-14 DIAGNOSIS — Z79899 Other long term (current) drug therapy: Secondary | ICD-10-CM | POA: Diagnosis not present

## 2015-05-14 DIAGNOSIS — E663 Overweight: Secondary | ICD-10-CM | POA: Insufficient documentation

## 2015-05-14 DIAGNOSIS — Z7901 Long term (current) use of anticoagulants: Secondary | ICD-10-CM | POA: Insufficient documentation

## 2015-05-14 DIAGNOSIS — Z87891 Personal history of nicotine dependence: Secondary | ICD-10-CM | POA: Diagnosis not present

## 2015-05-14 HISTORY — PX: TEE WITHOUT CARDIOVERSION: SHX5443

## 2015-05-14 SURGERY — ECHOCARDIOGRAM, TRANSESOPHAGEAL
Anesthesia: Moderate Sedation

## 2015-05-14 MED ORDER — SODIUM CHLORIDE 0.9 % IV SOLN
INTRAVENOUS | Status: DC
  Administered 2015-05-14: 500 mL via INTRAVENOUS

## 2015-05-14 MED ORDER — DIPHENHYDRAMINE HCL 50 MG/ML IJ SOLN
INTRAMUSCULAR | Status: AC
  Filled 2015-05-14: qty 1

## 2015-05-14 MED ORDER — MIDAZOLAM HCL 10 MG/2ML IJ SOLN
INTRAMUSCULAR | Status: DC | PRN
  Administered 2015-05-14: 2 mg via INTRAVENOUS
  Administered 2015-05-14: 1 mg via INTRAVENOUS
  Administered 2015-05-14: 2 mg via INTRAVENOUS

## 2015-05-14 MED ORDER — FENTANYL CITRATE (PF) 100 MCG/2ML IJ SOLN
INTRAMUSCULAR | Status: DC | PRN
Start: 2015-05-14 — End: 2015-05-14
  Administered 2015-05-14 (×2): 25 ug via INTRAVENOUS

## 2015-05-14 MED ORDER — MIDAZOLAM HCL 5 MG/ML IJ SOLN
INTRAMUSCULAR | Status: AC
  Filled 2015-05-14: qty 2

## 2015-05-14 MED ORDER — FENTANYL CITRATE (PF) 100 MCG/2ML IJ SOLN
INTRAMUSCULAR | Status: AC
  Filled 2015-05-14: qty 2

## 2015-05-14 NOTE — Interval H&P Note (Signed)
History and Physical Interval Note:  05/14/2015 8:05 AM  Miranda Taylor  has presented today for surgery, with the diagnosis of AFIB  The various methods of treatment have been discussed with the patient and family. After consideration of risks, benefits and other options for treatment, the patient has consented to  Procedure(s): TRANSESOPHAGEAL ECHOCARDIOGRAM (TEE) (N/A) as a surgical intervention .  The patient's history has been reviewed, patient examined, no change in status, stable for surgery.  I have reviewed the patient's chart and labs.  Questions were answered to the patient's satisfaction.     ,

## 2015-05-14 NOTE — H&P (View-Only) (Signed)
04/17/2015 BRETTA FEES   May 08, 1961  [id number]  Primary Physician Bing Matter, PA-C Primary Cardiologist: Lorretta Harp MD Miranda Taylor   HPI:   The patient is a delightful 54 year old mildly overweight married Caucasian female, mother of 64 and grandmother to 2 grandchildren, whom I last saw in the office 2 years  ago. She has a history of symptomatic paroxysmal atrial fibrillation with a low [place] score on beta-blocker and aspirin. She had a normal 2D echo and stress test. I did an event monitor that showed multiple episodes of PAF, which she is symptomatic from with complaints of shortness of breath and fatigue. I referred her to Dr. Thompson Grayer at [facility], Electrophysiologist, who placed her on flecainide 50 p.o. b.i.d. empirically. Her symptoms have improved.she had symptoms compatible with obstructive sleep apnea when I last saw her 2 years ago and a subsequent outpatient sleep study did not suggest this. She was recently admitted to the hospital 02/03/15 for one week with A. Fib with RVR. A transesophageal echo showed reduced systolic function with 74-94% range with left atrial smoke/thrombus and therefore cardioversion was not performed. She was placed on amiodarone and rate controlled and placed on oral anticoagulation. She subsequently converted as an outpatient and saw Dr. Rayann Heman in the office who has scheduled her for elective A. Fib fib/flutter ablation in the near future.  Current Outpatient Prescriptions  Medication Sig Dispense Refill  . amiodarone (PACERONE) 200 MG tablet Take 1 tablet (200 mg total) by mouth daily. 30 tablet 6  . apixaban (ELIQUIS) 5 MG TABS tablet Take 1 tablet (5 mg total) by mouth 2 (two) times daily. 60 tablet 6  . desvenlafaxine (PRISTIQ) 50 MG 24 hr tablet Take 50 mg by mouth every other day.     . furosemide (LASIX) 40 MG tablet Take one tablet by mouth daily as needed for swelling (Patient taking differently: as needed.  Take one tablet by mouth daily as needed for swelling) 30 tablet 3  . LORazepam (ATIVAN) 0.5 MG tablet Take 0.5 mg by mouth at bedtime. For sleep    . metoprolol tartrate (LOPRESSOR) 25 MG tablet Take 25 mg by mouth 2 (two) times daily.    . Multiple Vitamin (MULITIVITAMIN WITH MINERALS) TABS Take 1 tablet by mouth daily.    Marland Kitchen NEXIUM 40 MG capsule Take 40 mg by mouth daily as needed (for heartburn).     . nitrofurantoin, macrocrystal-monohydrate, (MACROBID) 100 MG capsule Take 100 mg by mouth.    . oxyCODONE (ROXICODONE) 15 MG immediate release tablet Take 1 tablet (15 mg total) by mouth every 4 (four) hours as needed. For pain (Patient taking differently: Take 7.5-15 mg by mouth every 4 (four) hours as needed for pain. For pain) 30 tablet 0  . potassium chloride (K-DUR) 10 MEQ tablet Take one tablet by mouth daily when taking the fluid pill for swelling 30 tablet 3  . rizatriptan (MAXALT-MLT) 10 MG disintegrating tablet Take 10 mg by mouth daily as needed for migraine.     Marland Kitchen zolpidem (AMBIEN) 5 MG tablet Take 5 mg by mouth at bedtime as needed. sleep     No current facility-administered medications for this visit.    No Known Allergies  History   Social History  . Marital Status: Legally Separated    Spouse Name: N/A  . Number of Children: N/A  . Years of Education: N/A   Occupational History  . Disabled    Social History Main Topics  .  Smoking status: Former Smoker -- 0.50 packs/day for 20 years    Types: Cigarettes    Quit date: 11/07/2001  . Smokeless tobacco: Never Used  . Alcohol Use: No  . Drug Use: No  . Sexual Activity: Not on file   Other Topics Concern  . Not on file   Social History Narrative   Pt lives with husband in [place].  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  Cardiovascular: negative for chest pain, dyspnea on  exertion, edema, orthopnea, palpitations, paroxysmal nocturnal dyspnea or shortness of breath Dermatological: negative for rash Respiratory: negative for cough or wheezing Urologic: negative for hematuria Abdominal: negative for nausea, vomiting, diarrhea, bright red blood per rectum, melena, or hematemesis Neurologic: negative for visual changes, syncope, or dizziness All other systems reviewed and are otherwise negative except as noted above.    Blood pressure 102/68, pulse 64, height 5\' 7"  (1.702 m), weight 188 lb (85.276 kg), last menstrual period 12/25/2012.  General appearance: alert and no distress Neck: no adenopathy, no carotid bruit, no JVD, supple, symmetrical, trachea midline and thyroid not enlarged, symmetric, no tenderness/mass/nodules Lungs: clear to auscultation bilaterally Heart: regular rate and rhythm, S1, S2 normal, no murmur, click, rub or gallop Extremities: extremities normal, atraumatic, no cyanosis or edema  EKG not performed today  ASSESSMENT AND PLAN:   Atrial flutter with rapid ventricular response Mrs. Quast has a history of PAF in the past. She was recently admitted for a week with A. Fib/flutter with RVR. A transesophageal echo showed thrombus in her left atrial appendage performed by Dr. Stanford Breed with an EF of 35-40% and therefore she did not undergo cardioversion. She was placed on oral anticoagulation, rate controlled on placed on amiodarone. She ultimately converted to sinus rhythm and saw Dr. Rayann Heman in the office recently was scheduled her for a ablation procedure in the beginning of July. Her amnio dose was reduced and her diltiazem discontinued. She does have some mild peripheral edema. The thought was that her reduced ejection fraction was related to her arrhythmia.      Lorretta Harp MD FACP,FACC,FAHA, [facility] 04/17/2015 10:31 AM

## 2015-05-14 NOTE — Progress Notes (Signed)
  Echocardiogram Echocardiogram Transesophageal has been performed.  Miranda Taylor 05/14/2015, 8:51 AM

## 2015-05-14 NOTE — Op Note (Signed)
INDICATIONS: atrial fibrillation, LA thrombus  PROCEDURE:   Informed consent was obtained prior to the procedure. The risks, benefits and alternatives for the procedure were discussed and the patient comprehended these risks.  Risks include, but are not limited to, cough, sore throat, vomiting, nausea, somnolence, esophageal and stomach trauma or perforation, bleeding, low blood pressure, aspiration, pneumonia, infection, trauma to the teeth and death.    After a procedural time-out, the oropharynx was anesthetized with 20% benzocaine spray. The patient was given 5 mg versed and 50 mcg fentanyl for moderate sedation.   The transesophageal probe was inserted in the esophagus and stomach without difficulty and multiple views were obtained.  The patient was kept under observation until the patient left the procedure room.  The patient left the procedure room in stable condition.   Agitated microbubble saline contrast was not administered.  COMPLICATIONS:    There were no immediate complications.  FINDINGS:  There is a pedunculated multilobulated, highly mobile, 1-1.5 cm mass attached to the lateral aspect of the left atrial appendage ostium. The patient is in sinus rhythm and has excellent appendage emptying velocities. She is fully anticoagulated.   RECOMMENDATIONS:     Consider atypical location for atrial myxoma, rather than thrombus. Will review with cMRI readers to see if MR may offer any advantage in differential diagnosis.  Time Spent Directly with the Patient:  60 minutes   , 05/14/2015, 8:08 AM

## 2015-05-14 NOTE — Discharge Instructions (Signed)
Transesophageal Echocardiogram °Transesophageal echocardiography (TEE) is a picture test of your heart using sound waves. The pictures taken can give very detailed pictures of your heart. This can help your doctor see if there are problems with your heart. TEE can check: °· If your heart has blood clots in it. °· How well your heart valves are working. °· If you have an infection on the inside of your heart. °· Some of the major arteries of your heart. °· If your heart valve is working after a repair. °· Your heart before a procedure that uses a shock to your heart to get the rhythm back to normal. °BEFORE THE PROCEDURE °· Do not eat or drink for 6 hours before the procedure or as told by your doctor. °· Make plans to have someone drive you home after the procedure. Do not drive yourself home. °· An IV tube will be put in your arm. °PROCEDURE °· You will be given a medicine to help you relax (sedative). It will be given through the IV tube. °· A numbing medicine will be sprayed or gargled in the back of your throat to help numb it. °· The tip of the probe is placed into the back of your mouth. You will be asked to swallow. This helps to pass the probe into your esophagus. °· Once the tip of the probe is in the right place, your doctor can take pictures of your heart. °· You may feel pressure at the back of your throat. °AFTER THE PROCEDURE °· You will be taken to a recovery area so the sedative can wear off. °· Your throat may be sore and scratchy. This will go away slowly over time. °· You will go home when you are fully awake and able to swallow liquids. °· You should have someone stay with you for the next 24 hours. °· Do not drive or operate machinery for the next 24 hours. °Document Released: 08/21/2009 Document Revised: 10/29/2013 Document Reviewed: 04/25/2013 °[employer]® Patient Information ©2015 [employer]. This information is not intended to replace advice given to you by your health care provider. Make  sure you discuss any questions you have with your health care provider. ° ° °Conscious Sedation, Adult, Care After °Refer to this sheet in the next few weeks. These instructions provide you with information on caring for yourself after your procedure. Your health care provider may also give you more specific instructions. Your treatment has been planned according to current medical practices, but problems sometimes occur. Call your health care provider if you have any problems or questions after your procedure. °WHAT TO EXPECT AFTER THE PROCEDURE  °After your procedure: °· You may feel sleepy, clumsy, and have poor balance for several hours. °· Vomiting may occur if you eat too soon after the procedure. °HOME CARE INSTRUCTIONS °· Do not participate in any activities where you could become injured for at least 24 hours. Do not: °¨ Drive. °¨ Swim. °¨ Ride a bicycle. °¨ Operate heavy machinery. °¨ Cook. °¨ Use power tools. °¨ Climb ladders. °¨ Work from a high place. °· Do not make important decisions or sign legal documents until you are improved. °· If you vomit, drink water, juice, or soup when you can drink without vomiting. Make sure you have little or no nausea before eating solid foods. °· Only take over-the-counter or prescription medicines for pain, discomfort, or fever as directed by your health care provider. °· Make sure you and your family fully understand everything about   the medicines given to you, including what side effects may occur. °· You should not drink alcohol, take sleeping pills, or take medicines that cause drowsiness for at least 24 hours. °· If you smoke, do not smoke without supervision. °· If you are feeling better, you may resume normal activities 24 hours after you were sedated. °· Keep all appointments with your health care provider. °SEEK MEDICAL CARE IF: °· Your skin is pale or bluish in color. °· You continue to feel nauseous or vomit. °· Your pain is getting worse and is not helped  by medicine. °· You have bleeding or swelling. °· You are still sleepy or feeling clumsy after 24 hours. °SEEK IMMEDIATE MEDICAL CARE IF: °· You develop a rash. °· You have difficulty breathing. °· You develop any type of allergic problem. °· You have a fever. °MAKE SURE YOU: °· Understand these instructions. °· Will watch your condition. °· Will get help right away if you are not doing well or get worse. °Document Released: 08/14/2013 Document Reviewed: 08/14/2013 °[employer]® Patient Information ©2015 [employer]. This information is not intended to replace advice given to you by your health care provider. Make sure you discuss any questions you have with your health care provider. ° °

## 2015-05-15 ENCOUNTER — Ambulatory Visit (HOSPITAL_COMMUNITY): Admission: RE | Admit: 2015-05-15 | Payer: Medicare Other | Source: Ambulatory Visit | Admitting: Internal Medicine

## 2015-05-15 ENCOUNTER — Encounter (HOSPITAL_COMMUNITY): Payer: Self-pay | Admitting: Cardiovascular Disease

## 2015-05-15 ENCOUNTER — Encounter (HOSPITAL_COMMUNITY): Admission: RE | Payer: Self-pay | Source: Ambulatory Visit

## 2015-05-15 SURGERY — ATRIAL FIBRILLATION ABLATION
Anesthesia: Monitor Anesthesia Care

## 2015-05-18 ENCOUNTER — Institutional Professional Consult (permissible substitution) (INDEPENDENT_AMBULATORY_CARE_PROVIDER_SITE_OTHER): Payer: Medicare Other | Admitting: Thoracic Surgery (Cardiothoracic Vascular Surgery)

## 2015-05-18 ENCOUNTER — Encounter: Payer: Self-pay | Admitting: Thoracic Surgery (Cardiothoracic Vascular Surgery)

## 2015-05-18 VITALS — BP 126/92 | HR 106 | Resp 16 | Ht 67.0 in | Wt 180.0 lb

## 2015-05-18 DIAGNOSIS — R222 Localized swelling, mass and lump, trunk: Secondary | ICD-10-CM | POA: Diagnosis not present

## 2015-05-18 DIAGNOSIS — I480 Paroxysmal atrial fibrillation: Secondary | ICD-10-CM

## 2015-05-18 DIAGNOSIS — I5189 Other ill-defined heart diseases: Secondary | ICD-10-CM

## 2015-05-18 NOTE — Progress Notes (Signed)
[address]       [place] [address]             [phone]     CARDIOTHORACIC SURGERY CONSULTATION REPORT  Referring Provider is Croitoru, Dani Gobble, MD  Primary Cardiologist is Lorretta Harp, MD PCP is PROVIDER NOT IN SYSTEM  Chief Complaint  Patient presents with  . Atrial Fibrillation    eval for MINI EXPOSURE for excision of atrial mass/myxoma.Marland KitchenMarland KitchenTEE 05/14/15    HPI:  Patient is a 54 year old female with history of recurrent paroxysmal atrial fibrillation and atrial flutter who has been referred for surgical consultation to consider surgical management of possible left atrial mass and atrial fibrillation.  The patient was first diagnosed with atrial fibrillation in 2013. She initially presented with increased urinary urgency and frequency related to chronic recurrent urinary tract infections. She was found to be in atrial fibrillation with rapid ventricular response at the time. She spontaneously converted to sinus rhythm and initially was treated with beta blocker therapy.  She has been followed ever since intermittently by Dr. Gwenlyn Found, Dr. Rayann Heman, and Dr. Lovena Le.  She does not experience symptoms of  Chest pain nor frequent palpitations, and she reports that her primary symptoms associated with recurrent episodes of atrial fibrillation are decreased exercise tolerance and fatigue. She has been placed on an event monitor in the past that has documented continued episodes of recurrent paroxysmal atrial fibrillation. She at times has palpitations, although these symptoms are not typically limiting or problematic.  She was initially not treated with anticoagulation due to low CHADSVASC score.  She was hospitalized March 29 through April 6 of this year with atrial flutter associated with rapid ventricular response. At the time she presented with symptoms of increased fatigue.  She ruled out for acute myocardial infarction. She was started on Eliquis for anticoagulation  and TEE was performed with intervention of proceeding with DC cardioversion. However, TEE reviewed a small mass in the left atrial appendage consistent with left atrial thrombus. Cardioversion was canceled and the patient was treated with beta blocker and diltiazem for rate control. Amiodarone was added prior to hospital discharge.  She was seen in follow-up by Dr. Lovena Le on 03/31/2015 at which time she was noted to have converted spontaneously to sinus rhythm, although she remained bradycardic while in sinus rhythm..  She was referred back to Dr. Rayann Heman to consider possible ablation for recurrent atrial fibrillation and atrial flutter.  She was seen in consultation by Dr. Rayann Heman 04/10/2015. Her dose of amiodarone was decreased and diltiazem was stopped. Plans were made for elective catheter-based ablation pending results of repeat TEE.  Follow-up TEE performed by Dr. Sallyanne Kuster on 05/14/2015 revealed persistent small mass in the left atrial appendage consistent with possible mass versus thrombus. The patient was referred for surgical consultation.  The patient is married and lives with her husband in [place].  She has been disabled for the last several years because of problems with chronic degenerative joint disease in her back. She previously worked in the [employer]. She describes a long history of mild symptoms of exertional shortness of breath and fatigue. Symptoms seem to wax and wane, and according to the patient her exertional shortness of breath and decreased energy coincide with episodes when she is out of rhythm. She denies any history of chest pain or chest tightness either with activity or at rest. She has occasional palpitations. She had lower extremity edema while taking diltiazem, but this has  resolved. She has never had any dizzy spells or syncope.  Past Medical History  Diagnosis Date  . GERD (gastroesophageal reflux disease)   . Chronic lower back pain   . Paroxysmal atrial  fibrillation   . Migraines   . Spinal stenosis   . SUI (stress urinary incontinence, female)   . History of kidney stones   . TMJ (temporomandibular joint syndrome)     LEFT SIDE--  WEAR  MOUTH GUARD  . H/O hiatal hernia   . Plantar fasciitis   . Typical atrial flutter   . 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 Anterior cervical decomp/discectomy fusion  04-08-2010    C4 -- C5  . Posterior fusion lumbar spine  09-16-2009    L4 -- L5  . Appendectomy  1984  . Transthoracic echocardiogram  04-03-2012    normal LVF/  ef 55-60%  . Cardiovascular stress test  04-27-2014   dr berry    normal perfusion study/  no ischemia/  ef 61%  . Lasik  1990's  . Bilateral thunb joint arthrodesis  left 2007/   right 2010  . Negative sleep study  04-15-2013  . Cystoscopy N/A 08/04/2014    Procedure: CYSTOSCOPY;  Surgeon: Ardis Hughs, MD;  Location: [facility];  Service: Urology;  Laterality: N/A;  . Pubovaginal sling N/A 08/04/2014    Procedure: BOSTON SCIENTIFIC RETRO-PUBIC MID URETHRAL SLING;  Surgeon: Ardis Hughs, MD;  Location: [facility];  Service: Urology;  Laterality: N/A;  . Tee without cardioversion N/A 02/06/2015    Procedure: TRANSESOPHAGEAL ECHOCARDIOGRAM (TEE);  Surgeon: Lelon Perla, MD;  Location: [facility] ENDOSCOPY;  Service: Cardiovascular;  Laterality: N/A;  . Tee without cardioversion N/A 05/14/2015    Procedure: TRANSESOPHAGEAL ECHOCARDIOGRAM (TEE);  Surgeon: Sanda Klein, MD;  Location: [facility] ENDOSCOPY;  Service: Cardiovascular;  Laterality: N/A;    Family History  Problem Relation Age of Onset  . Hypertension    . Heart attack Father 39  . Heart disease Mother   . CAD Father 26  . Arrhythmia Father     History   Social History  . Marital Status: Married    Spouse Name: N/A  . Number of Children: N/A  . Years of Education: N/A   Occupational History  . Disabled    Social  History Main Topics  . Smoking status: Former Smoker -- 0.50 packs/day for 20 years    Types: Cigarettes    Quit date: 11/07/2001  . Smokeless tobacco: Never Used  . Alcohol Use: No  . Drug Use: No  . Sexual Activity: Not on file   Other Topics Concern  . Not on file   Social History Narrative   Pt lives with husband in [place].  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    Current Outpatient Prescriptions  Medication Sig Dispense Refill  . amiodarone (PACERONE) 200 MG tablet Take 1 tablet (200 mg total) by mouth daily. 30 tablet 6  . apixaban (ELIQUIS) 5 MG TABS tablet Take 1 tablet (5 mg total) by mouth 2 (two) times daily. 60 tablet 6  . desvenlafaxine (PRISTIQ) 50 MG 24 hr tablet Take 50 mg by mouth every other day.     . furosemide (LASIX) 40 MG tablet Take one tablet by mouth daily as needed for swelling 30 tablet 3  . LORazepam (ATIVAN) 0.5 MG tablet Take 0.5 mg by  mouth at bedtime. For sleep    . metoprolol tartrate (LOPRESSOR) 25 MG tablet Take 1 tablet (25 mg total) by mouth 2 (two) times daily. 90 tablet 3  . Multiple Vitamin (MULITIVITAMIN WITH MINERALS) TABS Take 1 tablet by mouth daily.    Marland Kitchen NEXIUM 40 MG capsule Take 40 mg by mouth daily as needed (for heartburn).     . potassium chloride (K-DUR) 10 MEQ tablet Take one tablet by mouth daily when taking the fluid pill for swelling 30 tablet 3  . rizatriptan (MAXALT-MLT) 10 MG disintegrating tablet Take 10 mg by mouth daily as needed for migraine.     Marland Kitchen zolpidem (AMBIEN) 5 MG tablet Take 5 mg by mouth at bedtime as needed. sleep    . oxyCODONE (ROXICODONE) 15 MG immediate release tablet Take 1 tablet (15 mg total) by mouth every 4 (four) hours as needed. For pain (Patient taking differently: Take 7.5-15 mg by mouth every 4 (four) hours as needed for pain. For pain) 30 tablet 0   No current facility-administered medications for this visit.    No Known Allergies    Review  of Systems:   General:  normal appetite, decreased energy, no weight gain, no weight loss, no fever  Cardiac:  no chest pain with exertion, no chest pain at rest, + SOB with exertion, no resting SOB, no PND, no orthopnea, + palpitations, + arrhythmia, + atrial fibrillation, no LE edema, no dizzy spells, no syncope  Respiratory:  no shortness of breath, no home oxygen, no productive cough, no dry cough, no bronchitis, no wheezing, no hemoptysis, no asthma, no pain with inspiration or cough, no sleep apnea, no CPAP at night  GI:   no difficulty swallowing, + reflux, no frequent heartburn, no hiatal hernia, no abdominal pain, no constipation, no diarrhea, no hematochezia, no hematemesis, no melena  GU:   no dysuria,  no frequency, + recurrent urinary tract infection, no hematuria, no kidney stones, no kidney disease  Vascular:  no pain suggestive of claudication, no pain in feet, no leg cramps, no varicose veins, no DVT, no non-healing foot ulcer  Neuro:   no stroke, no TIA's, no seizures, no headaches, no temporary blindness one eye,  no slurred speech, no peripheral neuropathy, no chronic pain, no instability of gait, no memory/cognitive dysfunction  Musculoskeletal: no arthritis, no joint swelling, no myalgias, no difficulty walking, normal mobility   Skin:   no rash, no itching, no skin infections, no pressure sores or ulcerations  Psych:   no anxiety, no depression, no nervousness, no unusual recent stress  Eyes:   no blurry vision, no floaters, no recent vision changes, + wears glasses or contacts  ENT:   no hearing loss, no loose or painful teeth, no dentures, last saw dentist within 6 months  Hematologic:  + easy bruising, no abnormal bleeding, no clotting disorder, no frequent epistaxis  Endocrine:  no diabetes, does not check CBG's at home     Physical Exam:   BP 126/92 mmHg  Pulse 106  Resp 16  Ht 5\' 7"  (1.702 m)  Wt 180 lb (81.647 kg)  BMI 28.19 kg/m2  SpO2 97%  LMP  12/25/2012  General:  Obese but o/w  well-appearing  HEENT:  Unremarkable   Neck:   no JVD, no bruits, no adenopathy   Chest:   clear to auscultation, symmetrical breath sounds, no wheezes, no rhonchi   CV:   Irregular rate and rhythm, no murmur   Abdomen:  soft,  non-tender, no masses   Extremities:  warm, well-perfused, pulses diminished but palpable, no LE edema  Rectal/GU  Deferred  Neuro:   Grossly non-focal and symmetrical throughout  Skin:   Clean and dry, no rashes, no breakdown   Diagnostic Tests:  Transesophageal Echocardiography  Patient:  Miranda Taylor, Miranda Taylor MR #:    [id number] Study Date: 02/06/2015 Gender:   F Age:    44 Height:   170.2 cm Weight:   87.7 kg BSA:    2.06 m^2 Pt. Status: Room:    2H26C  ADMITTING  Shelva Majestic, M.D. ATTENDING  Shelva Majestic, M.D. ORDERING   Kirk Ruths PERFORMING  Kirk Ruths REFERRING  Kirk Ruths SONOGRAPHER Roseanna Rainbow  cc:  ------------------------------------------------------------------- LV EF: 35% -  40%  ------------------------------------------------------------------- Indications:   Atrial fibrillation - 427.31.  ------------------------------------------------------------------- History:  PMH:  Atrial flutter.  ------------------------------------------------------------------- Study Conclusions  - Left ventricle: Systolic function was moderately reduced. The estimated ejection fraction was in the range of 35% to 40%. Diffuse hypokinesis. - Aortic valve: No evidence of vegetation. There was mild regurgitation. - Mitral valve: No evidence of vegetation. There was mild regurgitation. - Left atrium: The atrium was mildly dilated. There was a possible, mobilethrombusin the appendage. There was spontaneous echo contrast (&quot;smoke&quot;) in the cavity. - Right atrium: No evidence of thrombus in the atrial cavity or appendage. - Atrial septum:  No defect or patent foramen ovale was identified. - Tricuspid valve: No evidence of vegetation. There was mild-moderate regurgitation. - Pulmonic valve: No evidence of vegetation. - Pericardium, extracardiac: A small pericardial effusion was identified.  Impressions:  - Technically difficult due to tachycardia (HR 140-150); definity used to assess LAA; noderate global reduction in LV function (EF difficult to quantitate due to tachycardia); mild LAE; spontaneous contrast in LA; possible thrombus at opening of LAA; mild MR and AI; mild to moderate TR; suggest anticoagulation for four weeks and then proceed with DCCV.  Diagnostic transesophageal echocardiography. 2D and color Doppler. Birthdate: Patient birthdate: May 16, 1961. Age: Patient is 54 yr old. Sex: Gender: female.  BMI: 30.3 kg/m^2. Blood pressure: 109/61 Patient status: Inpatient. Study date: Study date: 02/06/2015. Study time: 09:58 AM. Location: Endoscopy.  -------------------------------------------------------------------  ------------------------------------------------------------------- Left ventricle: Systolic function was moderately reduced. The estimated ejection fraction was in the range of 35% to 40%. Diffuse hypokinesis.  ------------------------------------------------------------------- Aortic valve:  Structurally normal valve.  Cusp separation was normal. No evidence of vegetation. Doppler: There was mild regurgitation.  ------------------------------------------------------------------- Aorta: Descending aorta: The descending aorta had mild diffuse disease.  ------------------------------------------------------------------- Mitral valve:  Structurally normal valve.  Leaflet separation was normal. No evidence of vegetation. Doppler: There was mild regurgitation.  ------------------------------------------------------------------- Left atrium: The atrium  was mildly dilated. There was a possible, mobilethrombusin the appendage. There was spontaneous echo contrast (&quot;smoke&quot;) in the cavity.  ------------------------------------------------------------------- Atrial septum: No defect or patent foramen ovale was identified.  ------------------------------------------------------------------- Right ventricle: The cavity size was normal. Systolic function was normal.  ------------------------------------------------------------------- Pulmonic valve:  Structurally normal valve.  Cusp separation was normal. No evidence of vegetation. Doppler: There was trivial regurgitation.  ------------------------------------------------------------------- Tricuspid valve:  Structurally normal valve.  Leaflet separation was normal. No evidence of vegetation. Doppler: There was mild-moderate regurgitation.  ------------------------------------------------------------------- Right atrium: The atrium was normal in size. No evidence of thrombus in the atrial cavity or appendage.  ------------------------------------------------------------------- Pericardium: A small pericardial effusion was identified.  ------------------------------------------------------------------- Prepared and Electronically Authenticated by  Kirk Ruths 2016-04-01T16:08:16   Transesophageal Echocardiography  Patient:  Miranda Taylor  MR #:    [id number] Study Date: 05/14/2015 Gender:   F Age:    39 Height:   170.2 cm Weight:   85.3 kg BSA:    2.03 m^2 Pt. Status: Room:  ADMITTING  Sanda Klein, MD PERFORMING  Sanda Klein, MD SONOGRAPHER [facility] ATTENDING  Lynnell Jude, Amber K Emelia Loron, Amber K REFERRING  Lynnell Jude, Amber K  cc:  ------------------------------------------------------------------- LV EF: 55% -   60%  ------------------------------------------------------------------- Indications:   Pre-op evaluation V728.1.  ------------------------------------------------------------------- History:  PMH: LAA mass.  ------------------------------------------------------------------- Study Conclusions  - Left ventricle: Systolic function was normal. The estimated ejection fraction was in the range of 55% to 60%. Wall motion was normal; there were no regional wall motion abnormalities. - Left atrium: The atrium was mildly dilated. There was a small, 1.0 cm (L) x 0.8 cm (W), irregular, hyperechoic, mobilemassin the atrial cavity or appendage; based on its appearance, myxoma or fibroelastoma cannot be excluded. - Right atrium: No evidence of thrombus in the atrial cavity or appendage. - Pericardium, extracardiac: A trivial pericardial effusion was identified.  Impressions:  - The previously described &quot;thrombus&quot; from February 06, 2015 is still seen in the left atrial appendage, despite restoration of sinus rhythm, excellent appendage contraction and 3 months of effective anticoagulation. It is visualized in more detail today at a much slower heart rate. It has features that raise the likelihood of a cardiac benign tumor, rather than a thrombus.  Diagnostic transesophageal echocardiography. 2D and color Doppler. Birthdate: Patient birthdate: 09/20/1961. Age: Patient is 54 yr old. Sex: Gender: female.  BMI: 29.4 kg/m^2. Blood pressure: 107/65 Patient status: Inpatient. Study date: Study date: 05/14/2015. Study time: 08:04 AM. Location: Endoscopy.  -------------------------------------------------------------------  ------------------------------------------------------------------- Left ventricle: Systolic function was normal. The estimated ejection fraction was in the range of 55% to 60%. Wall motion was normal; there were no  regional wall motion abnormalities.  ------------------------------------------------------------------- Aortic valve:  Structurally normal valve. Trileaflet; normal thickness leaflets. Cusp separation was normal. Doppler: There was no significant regurgitation.  ------------------------------------------------------------------- Aorta: The aorta was normal, not dilated, and non-diseased. There was no atheroma. There was no evidence for dissection. Aortic root: The aortic root was not dilated. Ascending aorta: The ascending aorta was normal in size. Aortic arch: The aortic arch was normal in size. Descending aorta: The descending aorta was normal in size.  ------------------------------------------------------------------- Mitral valve:  Structurally normal valve.  Leaflet separation was normal. Doppler: There was no significant regurgitation.  ------------------------------------------------------------------- Left atrium: The atrium was mildly dilated. There was a small, 1.0 cm (L) x 0.8 cm (W), irregular, hyperechoic, mobilemassin the atrial cavity or appendage; based on its appearance, myxoma or fibroelastoma cannot be excluded. The mass is attached to the lateral aspect of the base of the appendage. The appendage was morphologically a left appendage and of normal size. Emptying velocity was normal.  ------------------------------------------------------------------- Right ventricle: The cavity size was normal. Wall thickness was normal. Systolic function was normal.  ------------------------------------------------------------------- Pulmonic valve:  Structurally normal valve.  ------------------------------------------------------------------- Tricuspid valve:  Structurally normal valve.  Leaflet separation was normal. Doppler: There was mild regurgitation.  ------------------------------------------------------------------- Pulmonary artery:  The main  pulmonary artery was normal-sized.  ------------------------------------------------------------------- Right atrium: The atrium was normal in size. No evidence of thrombus in the atrial cavity or appendage. The appendage was morphologically a right appendage.  ------------------------------------------------------------------- Pericardium: A trivial pericardial effusion was identified.  ------------------------------------------------------------------- Post procedure conclusions Ascending Aorta:  - The aorta was normal, not dilated, and non-diseased.  -------------------------------------------------------------------  Measurements  Tricuspid valve           Value Tricuspid regurg peak velocity    253  cm/s Tricuspid peak RV-RA gradient    26  mm Hg  Legend: (L) and (H) mark values outside specified reference range.  ------------------------------------------------------------------- Prepared and Electronically Authenticated by  Candee Furbish, M.D. 2016-07-07T13:00:23               *[facility]*            [address], [place] [address]              [phone]  ------------------------------------------------------------------- Transesophageal Echocardiography  (Report amended )  Patient:  Miranda Taylor, Miranda Taylor MR #:    [id number] Study Date: 05/14/2015 Gender:   F Age:    89 Height:   170.2 cm Weight:   85.3 kg BSA:    2.03 m^2 Pt. Status: Room:  ADMITTING  Sanda Klein, MD PERFORMING  Sanda Klein, MD SONOGRAPHER [facility] ATTENDING  Lynnell Jude, Amber K Emelia Loron, Amber K REFERRING  Lynnell Jude, Amber K  cc:  ------------------------------------------------------------------- LV EF: 55% -   60%  ------------------------------------------------------------------- Indications:   Pre-op evaluation V728.1.  ------------------------------------------------------------------- History:  PMH: LAA mass.  ------------------------------------------------------------------- Study Conclusions  - Left ventricle: Systolic function was normal. The estimated ejection fraction was in the range of 55% to 60%. Wall motion was normal; there were no regional wall motion abnormalities. - Left atrium: The atrium was mildly dilated. There was a small, 1.0 cm (L) x 0.8 cm (W), irregular, hyperechoic, mobilemassin the atrial cavity or appendage; based on its appearance, myxoma or fibroelastoma cannot be excluded. - Right atrium: No evidence of thrombus in the atrial cavity or appendage. - Pericardium, extracardiac: A trivial pericardial effusion was identified.  Impressions:  - The previously described &quot;thrombus&quot; from February 06, 2015 is still seen in the left atrial appendage, despite restoration of sinus rhythm, excellent appendage contraction and 3 months of effective anticoagulation. It is visualized in more detail today at a much slower heart rate. It has features that raise the likelihood of a cardiac benign tumor, rather than a thrombus.  Diagnostic transesophageal echocardiography. 2D and color Doppler. Birthdate: Patient birthdate: [birth year]. Age: Patient is 54 yr old. Sex: Gender: female.  BMI: 29.4 kg/m^2. Blood pressure: 107/65 Patient status: Inpatient. Study date: Study date: 05/14/2015. Study time: 08:04 AM. Location: Endoscopy.  -------------------------------------------------------------------  ------------------------------------------------------------------- Left ventricle: Systolic function was normal. The estimated ejection fraction was in the range of 55% to 60%. Wall motion was normal; there were no  regional wall motion abnormalities.  ------------------------------------------------------------------- Aortic valve:  Structurally normal valve. Trileaflet; normal thickness leaflets. Cusp separation was normal. Doppler: There was no significant regurgitation.  ------------------------------------------------------------------- Aorta: The aorta was normal, not dilated, and non-diseased. There was no atheroma. There was no evidence for dissection. Aortic root: The aortic root was not dilated. Ascending aorta: The ascending aorta was normal in size. Aortic arch: The aortic arch was normal in size. Descending aorta: The descending aorta was normal in size.  ------------------------------------------------------------------- Mitral valve:  Structurally normal valve.  Leaflet separation was normal. Doppler: There was no significant regurgitation.  ------------------------------------------------------------------- Left atrium: The atrium was mildly dilated. There was a small, 1.0 cm (L) x 0.8 cm (W), irregular, hyperechoic, mobilemassin the atrial cavity or appendage; based on its appearance, myxoma  or fibroelastoma cannot be excluded. The mass is attached to the lateral aspect of the base of the appendage. The appendage was morphologically a left appendage and of normal size. Emptying velocity was normal.  ------------------------------------------------------------------- Right ventricle: The cavity size was normal. Wall thickness was normal. Systolic function was normal.  ------------------------------------------------------------------- Pulmonic valve:  Structurally normal valve.  ------------------------------------------------------------------- Tricuspid valve:  Structurally normal valve.  Leaflet separation was normal. Doppler: There was mild regurgitation.  ------------------------------------------------------------------- Pulmonary artery:  The main  pulmonary artery was normal-sized.  ------------------------------------------------------------------- Right atrium: The atrium was normal in size. No evidence of thrombus in the atrial cavity or appendage. The appendage was morphologically a right appendage.  ------------------------------------------------------------------- Pericardium: A trivial pericardial effusion was identified.  ------------------------------------------------------------------- Post procedure conclusions Ascending Aorta:  - The aorta was normal, not dilated, and non-diseased.  ------------------------------------------------------------------- Measurements  Tricuspid valve           Value Tricuspid regurg peak velocity    253  cm/s Tricuspid peak RV-RA gradient    26  mm Hg  Legend: (L) and (H) mark values outside specified reference range.  ------------------------------------------------------------------- Renaldo Harrison, MD 2016-07-07T13:02:38   Impression:  Patient has long history of recurrent paroxysmal atrial fibrillation and atrial flutter dating back at least 3 years. Up until 3 months ago she had not been treated with anticoagulation therapy, but Eliquis was initiated in early April after TEE suggested the presence of left atrial thrombus. Follow-up TEE performed last week demonstrates persistence of a small mass near the orifice of the left atrial appendage. I have personally reviewed both of the patient's transesophageal echocardiograms. 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   Options include continued observation on current anticoagulation therapy using Eliquis, changing to an alternative method of anticoagulation such as warfarin, or proceeding directly to surgical resection of this  small mass near the orifice of the left atrial appendage.   I feel that proceeding with surgery at this point might be a bit aggressive and potentially unnecessary, although concomitant Maze procedure could be performed at the time.  The patient might be a candidate for a minimally invasive approach for surgery, although pre-operative diagnostic cardiac catheterization would be required to rule out the presence of coronary artery disease.    Plan:  I have discussed options at length with the patient and her husband in the office today. She desires to continue medical therapy with hopes that this mass may prove to be left atrial thrombus as originally suspected and resolve on long-term anticoagulation therapy. I will defer any subsequent decision regarding whether or not to change the patient from Eliquis to a different NOAC or warfarin to Dr. Gwenlyn Found and/or Dr. Rayann Heman, but I would favor using warfarin in this setting so that the patient's therapeutic state of anticoagulation can be documented.  I would recommend waiting at least another 3 months before repeating her transesophageal echocardiogram. We will plan to see her back in the office once that has been completed. All of her questions have been addressed.   I spent in excess of 90 minutes during the conduct of this office consultation and >50% of this time involved direct face-to-face encounter with the patient for counseling and/or coordination of their care.   Valentina Gu. Roxy Manns, MD 05/18/2015 5:21 PM

## 2015-05-18 NOTE — Patient Instructions (Signed)
The patient should continue all previous medications without changes at this time  

## 2015-05-19 ENCOUNTER — Telehealth: Payer: Self-pay | Admitting: *Deleted

## 2015-05-19 NOTE — Telephone Encounter (Signed)
I received a message from Dr Gwenlyn Found asking me to get patient an appt with him.  I left a message for the patient to call so that I could schedule the appt.

## 2015-05-25 NOTE — Telephone Encounter (Signed)
I spoke with patient and Roderic Palau NP.  Patient will be seen by Roderic Palau next week as scheduled.

## 2015-06-01 ENCOUNTER — Encounter (HOSPITAL_COMMUNITY): Payer: Self-pay | Admitting: Nurse Practitioner

## 2015-06-01 ENCOUNTER — Ambulatory Visit (HOSPITAL_COMMUNITY)
Admission: RE | Admit: 2015-06-01 | Discharge: 2015-06-01 | Disposition: A | Discharge status: Home / Self Care | Payer: Medicare Other | Source: Ambulatory Visit | Attending: Nurse Practitioner | Admitting: Nurse Practitioner

## 2015-06-01 VITALS — BP 122/70 | HR 38 | Ht 67.0 in | Wt 186.2 lb

## 2015-06-01 DIAGNOSIS — I480 Paroxysmal atrial fibrillation: Secondary | ICD-10-CM

## 2015-06-01 NOTE — Patient Instructions (Signed)
Parking code [id number] 

## 2015-06-02 ENCOUNTER — Encounter (HOSPITAL_COMMUNITY): Payer: Self-pay | Admitting: Nurse Practitioner

## 2015-06-02 NOTE — Addendum Note (Signed)
Encounter addended by: Sherran Needs, NP on: 06/02/2015 10:23 AM<BR>     Documentation filed: Notes Section

## 2015-06-02 NOTE — Progress Notes (Addendum)
Patient ID: Miranda Taylor, female   DOB: [birth year], 54 y.o.   MRN: [id number]     Referring  Physician: Dr. Roxy Manns Electrophysiologist: Dr. Rayann Heman Cardiologist: Dr. Lezlie Lye is a 54 y.o. female with a h/o PAF,recently seen by Dr. Roxy Manns for possible surgical management of possible left atrial mass and atrial fibrillation. The patient was first diagnosed with atrial fibrillation in 2013. She initially presented with increased urinary urgency and frequency related to chronic recurrent urinary tract infections. She was found to be in atrial fibrillation with rapid ventricular response at the time. She spontaneously converted to sinus rhythm and initially was treated with beta blocker therapy. She has been followed ever since intermittently by Dr. Gwenlyn Found, Dr. Rayann Heman, and Dr. Lovena Le. She does not experience symptoms of Chest pain nor frequent palpitations, and she reports that her primary symptoms associated with recurrent episodes of atrial fibrillation are decreased exercise tolerance and fatigue. She has been placed on an event monitor in the past that has documented continued episodes of recurrent paroxysmal atrial fibrillation. She at times has palpitations, although these symptoms are not typically limiting or problematic. She was initially not treated with anticoagulation due to low CHADSVASC score. She was hospitalized March 29 through April 6 of this year with atrial flutter associated with rapid ventricular response. At the time she presented with symptoms of increased fatigue. She ruled out for acute myocardial infarction. She was started on Eliquis in April of thsi year  for anticoagulation and TEE was performed with intervention of proceeding with DC cardioversion. However, TEE reviewed a small mass in the left atrial appendage consistent with left atrial thrombus. Cardioversion was canceled and the patient was treated with beta blocker and diltiazem for rate control. Amiodarone was  added prior to hospital discharge. She was seen in follow-up by Dr. Lovena Le on 03/31/2015 at which time she was noted to have converted spontaneously to sinus rhythm, although she remains bradycardic while in sinus rhythm.. She was referred back to Dr. Rayann Heman to consider possible ablation for recurrent atrial fibrillation and atrial flutter. She was seen in consultation by Dr. Rayann Heman 04/10/2015. Her dose of amiodarone was decreased and diltiazem was stopped. Plans were made for elective catheter-based ablation pending results of repeat TEE. Follow-up TEE performed by Dr. Sallyanne Kuster on 05/14/2015 revealed persistent small mass in the left atrial appendage consistent with possible mass versus thrombus. The patient was referred for surgical consultation.   The pt is being seen in the afib clinic today to discuss change of anticoagulant due  to possible persistent thrombus vrs mass. The pt reports that she has not been taking Eliquis correctly only taking once daily. This was brought to her attention when Dr. Roxy Manns asked her if she had been taking twice a day. She told him yes, but looked at the bottle when she got home and realized that even thought the bottle was labeled correctly, she had only been taking once daily. I discussed with Dr. Rayann Heman and we do not consider this as a failure of the drug. So, she will continue on Eliquis 5 mg bid.. Otherwise, no complaints. Remaining in SR although bradycardic, asymptomatic.   Today, she denies symptoms of palpitations, chest pain, shortness of breath, orthopnea, PND, lower extremity edema, dizziness, presyncope, syncope, or neurologic sequela. The patient is tolerating medications without difficulties and is otherwise without complaint today.   Past Medical History  Diagnosis Date  . GERD (gastroesophageal reflux disease)   . Chronic lower back pain   .  Paroxysmal atrial fibrillation   . Migraines   . Spinal stenosis   . SUI (stress urinary incontinence,  female)   . History of kidney stones   . TMJ (temporomandibular joint syndrome)     LEFT SIDE--  WEAR  MOUTH GUARD  . H/O hiatal hernia   . Plantar fasciitis   . Typical atrial flutter   . 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 Anterior cervical decomp/discectomy fusion  04-08-2010    C4 -- C5  . Posterior fusion lumbar spine  09-16-2009    L4 -- L5  . Appendectomy  1984  . Transthoracic echocardiogram  04-03-2012    normal LVF/  ef 55-60%  . Cardiovascular stress test  04-27-2014   dr berry    normal perfusion study/  no ischemia/  ef 61%  . Lasik  1990's  . Bilateral thunb joint arthrodesis  left 2007/   right 2010  . Negative sleep study  04-15-2013  . Cystoscopy N/A 08/04/2014    Procedure: CYSTOSCOPY;  Surgeon: Ardis Hughs, MD;  Location: [facility];  Service: Urology;  Laterality: N/A;  . Pubovaginal sling N/A 08/04/2014    Procedure: BOST[employer] SCIENTIFIC RETRO-PUBIC MID URETHRAL SLING;  Surgeon: Ardis Hughs, MD;  Location: [facility];  Service: Urology;  Laterality: N/A;  . Tee without cardioversion N/A 02/06/2015    Procedure: TRANSESOPHAGEAL ECHOCARDIOGRAM (TEE);  Surgeon: Lelon Perla, MD;  Location: [facility] ENDOSCOPY;  Service: Cardiovascular;  Laterality: N/A;  . Tee without cardioversion N/A 05/14/2015    Procedure: TRANSESOPHAGEAL ECHOCARDIOGRAM (TEE);  Surgeon: Sanda Klein, MD;  Location: [facility] ENDOSCOPY;  Service: Cardiovascular;  Laterality: N/A;    Current Outpatient Prescriptions  Medication Sig Dispense Refill  . amiodarone (PACERONE) 200 MG tablet Take 1 tablet (200 mg total) by mouth daily. 30 tablet 6  . apixaban (ELIQUIS) 5 MG TABS tablet Take 1 tablet (5 mg total) by mouth 2 (two) times daily. 60 tablet 6  . baclofen (LIORESAL) 10 MG tablet Take 10 mg by mouth 2 (two) times daily as needed.  2  . desvenlafaxine (PRISTIQ) 50 MG 24 hr tablet Take 50 mg by mouth every  other day.     . furosemide (LASIX) 40 MG tablet Take one tablet by mouth daily as needed for swelling 30 tablet 3  . LORazepam (ATIVAN) 0.5 MG tablet Take 0.5 mg by mouth at bedtime. For sleep    . metoprolol tartrate (LOPRESSOR) 25 MG tablet Take 1 tablet (25 mg total) by mouth 2 (two) times daily. 90 tablet 3  . Multiple Vitamin (MULITIVITAMIN WITH MINERALS) TABS Take 1 tablet by mouth daily.    Marland Kitchen NEXIUM 40 MG capsule Take 40 mg by mouth daily as needed (for heartburn).     Marland Kitchen oxyCODONE (ROXICODONE) 15 MG immediate release tablet Take 1 tablet (15 mg total) by mouth every 4 (four) hours as needed. For pain (Patient taking differently: Take 7.5-15 mg by mouth every 4 (four) hours as needed for pain. For pain) 30 tablet 0  . potassium chloride (K-DUR) 10 MEQ tablet Take one tablet by mouth daily when taking the fluid pill for swelling 30 tablet 3  . rizatriptan (MAXALT-MLT) 10 MG disintegrating tablet Take 10 mg by mouth daily as needed for migraine.     Marland Kitchen zolpidem (AMBIEN) 5 MG tablet Take 5 mg by mouth at bedtime as needed. sleep     No current  facility-administered medications for this encounter.    No Known Allergies  History   Social History  . Marital Status: Married    Spouse Name: N/A  . Number of Children: N/A  . Years of Education: N/A   Occupational History  . Disabled    Social History Main Topics  . Smoking status: Former Smoker -- 0.50 packs/day for 20 years    Types: Cigarettes    Quit date: 11/07/2001  . Smokeless tobacco: Never Used  . Alcohol Use: No  . Drug Use: No  . Sexual Activity: Not on file   Other Topics Concern  . Not on file   Social History Narrative   Pt lives with husband in [place].  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 Hypertension    . Heart attack Father 79  . Heart disease Mother   . CAD Father 46  . Arrhythmia Father     ROS-  All systems are reviewed and negative except as per the HPI above  Physical Exam: Filed Vitals:   06/01/15 1111  BP: 122/70  Pulse: 38  Height: 5\' 7"  (1.702 m)  Weight: 186 lb 3.2 oz (84.46 kg)    GEN- The patient is well appearing, alert and oriented x 3 today.   Head- normocephalic, atraumatic Eyes-  Sclera clear, conjunctiva pink Ears- hearing intact Oropharynx- clear Neck- supple, no JVP Lymph- no cervical lymphadenopathy Lungs- Clear to ausculation bilaterally, normal work of breathing Heart- Regular rate and rhythm, no murmurs, rubs or gallops, PMI not laterally displaced GI- soft, NT, ND, + BS Extremities- no clubbing, cyanosis, or edema MS- no significant deformity or atrophy Skin- no rash or lesion Psych- euthymic mood, full affect Neuro- strength and sensation are intact  EKG- Marked sinus bradycardia, 38 bpm, Pr int 170 ms,  QRS 78 ms, QTc 427 ms Epic records reviewed  Assessment and Plan: 1. Possible left atrial thrombus vrs mass Pt had not been taking Eliquis 5 mg bid correctly, only taking it once daily since April. So this is not a failure of drug. She will continue Eliquis at correct dose. She will return to this office mid October when we will set up repeat TEE in time for her to f/u with Dr. Roxy Manns in November.  2. PAF Maintaining SR, although bradycardic, asymptomatic. EKG unchanged as to last seen with Dr. Rayann Heman.

## 2015-06-05 NOTE — Telephone Encounter (Signed)
Opened in error

## 2015-06-12 ENCOUNTER — Ambulatory Visit (HOSPITAL_COMMUNITY): Payer: Self-pay | Admitting: Nurse Practitioner

## 2015-07-10 ENCOUNTER — Encounter (HOSPITAL_BASED_OUTPATIENT_CLINIC_OR_DEPARTMENT_OTHER): Payer: Self-pay

## 2015-07-10 ENCOUNTER — Emergency Department (HOSPITAL_BASED_OUTPATIENT_CLINIC_OR_DEPARTMENT_OTHER)
Admission: EM | Admit: 2015-07-10 | Discharge: 2015-07-10 | Disposition: A | Discharge status: Home / Self Care | Payer: BLUE CROSS/BLUE SHIELD | Attending: Emergency Medicine | Admitting: Emergency Medicine

## 2015-07-10 DIAGNOSIS — Z87442 Personal history of urinary calculi: Secondary | ICD-10-CM | POA: Insufficient documentation

## 2015-07-10 DIAGNOSIS — M436 Torticollis: Secondary | ICD-10-CM | POA: Insufficient documentation

## 2015-07-10 DIAGNOSIS — Z87891 Personal history of nicotine dependence: Secondary | ICD-10-CM | POA: Insufficient documentation

## 2015-07-10 DIAGNOSIS — Z8719 Personal history of other diseases of the digestive system: Secondary | ICD-10-CM | POA: Diagnosis not present

## 2015-07-10 DIAGNOSIS — Z79899 Other long term (current) drug therapy: Secondary | ICD-10-CM | POA: Insufficient documentation

## 2015-07-10 DIAGNOSIS — Z8679 Personal history of other diseases of the circulatory system: Secondary | ICD-10-CM | POA: Diagnosis not present

## 2015-07-10 DIAGNOSIS — G43909 Migraine, unspecified, not intractable, without status migrainosus: Secondary | ICD-10-CM | POA: Diagnosis not present

## 2015-07-10 DIAGNOSIS — M542 Cervicalgia: Secondary | ICD-10-CM | POA: Diagnosis present

## 2015-07-10 DIAGNOSIS — G8929 Other chronic pain: Secondary | ICD-10-CM | POA: Insufficient documentation

## 2015-07-10 LAB — BASIC METABOLIC PANEL
Anion gap: 6 (ref 5–15)
BUN: 17 mg/dL (ref 6–20)
CHLORIDE: 105 mmol/L (ref 101–111)
CO2: 28 mmol/L (ref 22–32)
CREATININE: 0.96 mg/dL (ref 0.44–1.00)
Calcium: 9 mg/dL (ref 8.9–10.3)
GFR calc Af Amer: >60 mL/min (ref >60)
GFR calc non Af Amer: >60 mL/min (ref >60)
GLUCOSE: 114 mg/dL — AB (ref 65–99)
Potassium: 3.6 mmol/L (ref 3.5–5.1)
SODIUM: 139 mmol/L (ref 135–145)

## 2015-07-10 LAB — CBC WITH DIFFERENTIAL/PLATELET
Basophils Absolute: 0 10*3/uL (ref 0.0–0.1)
Basophils Relative: 0 % (ref 0–1)
EOS PCT: 1 % (ref 0–5)
Eosinophils Absolute: 0.1 10*3/uL (ref 0.0–0.7)
HCT: 39 % (ref 36.0–46.0)
Hemoglobin: 12.3 g/dL (ref 12.0–15.0)
LYMPHS ABS: 2.5 10*3/uL (ref 0.7–4.0)
LYMPHS PCT: 31 % (ref 12–46)
MCH: 28.1 pg (ref 26.0–34.0)
MCHC: 31.5 g/dL (ref 30.0–36.0)
MCV: 89.2 fL (ref 78.0–100.0)
MONOS PCT: 5 % (ref 3–12)
Monocytes Absolute: 0.4 10*3/uL (ref 0.1–1.0)
Neutro Abs: 4.9 10*3/uL (ref 1.7–7.7)
Neutrophils Relative %: 63 % (ref 43–77)
Platelets: 209 10*3/uL (ref 150–400)
RBC: 4.37 MIL/uL (ref 3.87–5.11)
RDW: 13.5 % (ref 11.5–15.5)
WBC: 7.9 10*3/uL (ref 4.0–10.5)

## 2015-07-10 MED ORDER — DIAZEPAM 5 MG PO TABS
5.0000 mg | ORAL_TABLET | Freq: Once | ORAL | Status: AC
  Administered 2015-07-10: 5 mg via ORAL
  Filled 2015-07-10: qty 1

## 2015-07-10 MED ORDER — HYDROMORPHONE HCL 1 MG/ML IJ SOLN: 1.0000 mg | Freq: Once | INTRAMUSCULAR | Status: DC

## 2015-07-10 MED ORDER — OXYCODONE-ACETAMINOPHEN 5-325 MG PO TABS: 1.0000 | ORAL_TABLET | ORAL | Status: DC | PRN

## 2015-07-10 MED ORDER — HYDROMORPHONE HCL 1 MG/ML IJ SOLN
1.0000 mg | Freq: Once | INTRAMUSCULAR | Status: AC
  Administered 2015-07-10: 1 mg via INTRAMUSCULAR
  Filled 2015-07-10: qty 1

## 2015-07-10 MED ORDER — DIPHENHYDRAMINE HCL 25 MG PO CAPS
25.0000 mg | ORAL_CAPSULE | Freq: Once | ORAL | Status: AC
  Administered 2015-07-10: 25 mg via ORAL
  Filled 2015-07-10: qty 1

## 2015-07-10 MED ORDER — CYCLOBENZAPRINE HCL 10 MG PO TABS: 10.0000 mg | ORAL_TABLET | Freq: Two times a day (BID) | ORAL | Status: DC | PRN

## 2015-07-10 NOTE — Discharge Instructions (Signed)

## 2015-07-10 NOTE — ED Notes (Signed)
Pt woke with neck pain-denies injury, fever or illness

## 2015-07-10 NOTE — ED Provider Notes (Signed)
CSN: [id number]     Arrival date & time 07/10/15  1151 History   First MD Initiated Contact with Patient 07/10/15 1207     Chief Complaint  Patient presents with  . Neck Pain     (Consider location/radiation/quality/duration/timing/severity/associated sxs/prior Treatment) Patient is a 54 y.o. female presenting with general illness.  Illness Location:  L neck Quality:  Aching pain Severity:  Moderate Onset quality:  Gradual Duration:  1 day Timing:  Constant Progression:  Unchanged Chronicity:  New Context:  Sleeping poorly, present on awakening, atraumatic Relieved by:  Nothing Worsened by:  Movement, rotation Associated symptoms: headaches (at neck) and nausea   Associated symptoms: no cough, no diarrhea, no fatigue, no fever and no vomiting     Past Medical History  Diagnosis Date  . GERD (gastroesophageal reflux disease)   . Chronic lower back pain   . Paroxysmal atrial fibrillation   . Migraines   . Spinal stenosis   . SUI (stress urinary incontinence, female)   . History of kidney stones   . TMJ (temporomandibular joint syndrome)     LEFT SIDE--  WEAR  MOUTH GUARD  . H/O hiatal hernia   . Plantar fasciitis   . Typical atrial flutter   . 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 Anterior cervical decomp/discectomy fusion  04-08-2010    C4 -- C5  . Posterior fusion lumbar spine  09-16-2009    L4 -- L5  . Appendectomy  1984  . Transthoracic echocardiogram  04-03-2012    normal LVF/  ef 55-60%  . Cardiovascular stress test  04-27-2014   dr berry    normal perfusion study/  no ischemia/  ef 61%  . Lasik  1990's  . Bilateral thunb joint arthrodesis  left 2007/   right 2010  . Negative sleep study  04-15-2013  . Cystoscopy N/A 08/04/2014    Procedure: CYSTOSCOPY;  Surgeon: Ardis Hughs, MD;  Location: [facility];  Service: Urology;  Laterality: N/A;  . Pubovaginal sling N/A 08/04/2014   Procedure: B[employer] RETRO-PUBIC MID URETHRAL SLING;  Surgeon: Ardis Hughs, MD;  Location: [facility];  Service: Urology;  Laterality: N/A;  . Tee without cardioversion N/A 02/06/2015    Procedure: TRANSESOPHAGEAL ECHOCARDIOGRAM (TEE);  Surgeon: Lelon Perla, MD;  Location: [facility] ENDOSCOPY;  Service: Cardiovascular;  Laterality: N/A;  . Tee without cardioversion N/A 05/14/2015    Procedure: TRANSESOPHAGEAL ECHOCARDIOGRAM (TEE);  Surgeon: Sanda Klein, MD;  Location: [facility] ENDOSCOPY;  Service: Cardiovascular;  Laterality: N/A;   Family History  Problem Relation Age of Onset  . Hypertension    . Heart attack Father 17  . Heart disease Mother   . CAD Father 70  . Arrhythmia Father    Social History  Substance Use Topics  . Smoking status: Former Smoker -- 0.50 packs/day for 20 years    Types: Cigarettes    Quit date: 11/07/2001  . Smokeless tobacco: Never Used  . Alcohol Use: No   OB History    No data available     Review of Systems  Constitutional: Negative for fever and fatigue.  Respiratory: Negative for cough.   Gastrointestinal: Positive for nausea. Negative for vomiting and diarrhea.  Neurological: Positive for headaches (at neck).  All other systems reviewed and are negative.     Allergies  Review of patient's allergies indicates no known allergies.  Home Medications   Prior to  Admission medications   Medication Sig Start Date End Date Taking? Authorizing Provider  amiodarone (PACERONE) 200 MG tablet Take 1 tablet (200 mg total) by mouth daily. 04/10/15   Thompson Grayer, MD  apixaban (ELIQUIS) 5 MG TABS tablet Take 1 tablet (5 mg total) by mouth 2 (two) times daily. 02/11/15   Rogelia Mire, NP  baclofen (LIORESAL) 10 MG tablet Take 10 mg by mouth 2 (two) times daily as needed. 04/10/15   Historical Provider, MD  cyclobenzaprine (FLEXERIL) 10 MG tablet Take 1 tablet (10 mg total) by mouth 2 (two) times daily as needed for muscle spasms.  07/10/15   Debby Freiberg, MD  desvenlafaxine (PRISTIQ) 50 MG 24 hr tablet Take 50 mg by mouth every other day.     Historical Provider, MD  furosemide (LASIX) 40 MG tablet Take one tablet by mouth daily as needed for swelling 03/17/15   Evans Lance, MD  LORazepam (ATIVAN) 0.5 MG tablet Take 0.5 mg by mouth at bedtime. For sleep    Historical Provider, MD  metoprolol tartrate (LOPRESSOR) 25 MG tablet Take 1 tablet (25 mg total) by mouth 2 (two) times daily. 05/07/15   Evans Lance, MD  Multiple Vitamin (MULITIVITAMIN WITH MINERALS) TABS Take 1 tablet by mouth daily.    Historical Provider, MD  NEXIUM 40 MG capsule Take 40 mg by mouth daily as needed (for heartburn).  06/25/12   Historical Provider, MD  oxyCODONE (ROXICODONE) 15 MG immediate release tablet Take 1 tablet (15 mg total) by mouth every 4 (four) hours as needed. For pain Patient taking differently: Take 7.5-15 mg by mouth every 4 (four) hours as needed for pain. For pain 08/04/14   Ardis Hughs, MD  oxyCODONE-acetaminophen (PERCOCET/ROXICET) 5-325 MG per tablet Take 1-2 tablets by mouth every 4 (four) hours as needed. 07/10/15   Debby Freiberg, MD  potassium chloride (K-DUR) 10 MEQ tablet Take one tablet by mouth daily when taking the fluid pill for swelling 03/17/15   Evans Lance, MD  rizatriptan (MAXALT-MLT) 10 MG disintegrating tablet Take 10 mg by mouth daily as needed for migraine.  02/26/13   Historical Provider, MD  zolpidem (AMBIEN) 5 MG tablet Take 5 mg by mouth at bedtime as needed. sleep 02/12/15 02/12/16  Historical Provider, MD   BP 118/71 mmHg  Pulse 50  Temp(Src) 98.2 F (36.8 C)  Resp 18  Ht 5\' 7"  (1.702 m)  Wt 180 lb (81.647 kg)  BMI 28.19 kg/m2  SpO2 100%  LMP 12/25/2012 Physical Exam  Constitutional: She is oriented to person, place, and time. She appears well-developed and well-nourished.  HENT:  Head: Normocephalic and atraumatic.  Right Ear: External ear normal.  Left Ear: External ear normal.  Eyes:  Conjunctivae and EOM are normal. Pupils are equal, round, and reactive to light.  Neck: Normal range of motion. Neck supple.  Cardiovascular: Normal rate, regular rhythm, normal heart sounds and intact distal pulses.   Pulmonary/Chest: Effort normal and breath sounds normal.  Abdominal: Soft. Bowel sounds are normal. There is no tenderness.  Musculoskeletal:       Cervical back: She exhibits decreased range of motion (limited L rotation) and tenderness. She exhibits no bony tenderness.  Neurological: She is alert and oriented to person, place, and time.  Skin: Skin is warm and dry.  Vitals reviewed.   ED Course  Procedures (including critical care time) Labs Review Labs Reviewed  BASIC METABOLIC PANEL - Abnormal; Notable for the following:  Glucose, Bld 114 (*)    All other components within normal limits  CBC WITH DIFFERENTIAL/PLATELET    Imaging Review No results found. I have personally reviewed and evaluated these images and lab results as part of my medical decision-making.   EKG Interpretation None      MDM   Final diagnoses:  Torticollis    54 y.o. female with pertinent PMH of PAF, atrial flutter presents with atraumatic neck pain.  History and exam consistent with trap spasm and torticollis.  Exact reproduction of pain with palpation from occiput to L shoulder Initially limited L rotation, this improved with valium, dilaudid.  No fever or signs of meningitis.  No fatigue or other concerning historical features.  Offered CT scan of neck which pt refused.  DC home in stable condition.    I have reviewed all laboratory and imaging studies if ordered as above  1. Torticollis         Debby Freiberg, MD 07/11/15 [phone]

## 2015-07-10 NOTE — ED Notes (Signed)
MD at bedside. 

## 2015-08-17 ENCOUNTER — Ambulatory Visit (HOSPITAL_COMMUNITY): Payer: Self-pay | Admitting: Nurse Practitioner

## 2015-08-26 ENCOUNTER — Ambulatory Visit (HOSPITAL_COMMUNITY)
Admission: RE | Admit: 2015-08-26 | Discharge: 2015-08-26 | Disposition: A | Discharge status: Home / Self Care | Payer: BLUE CROSS/BLUE SHIELD | Source: Ambulatory Visit | Attending: Nurse Practitioner | Admitting: Nurse Practitioner

## 2015-08-26 ENCOUNTER — Encounter (HOSPITAL_COMMUNITY): Payer: Self-pay | Admitting: Nurse Practitioner

## 2015-08-26 VITALS — BP 114/74 | HR 51 | Ht 67.0 in | Wt 190.8 lb

## 2015-08-26 DIAGNOSIS — I480 Paroxysmal atrial fibrillation: Secondary | ICD-10-CM | POA: Diagnosis present

## 2015-08-26 NOTE — Patient Instructions (Addendum)
TEE scheduled for Wednesday, October 26th  - Arrive at the [facility] and go to admitting at 7:30am  -Do not eat or drink anything after midnight the night prior to your procedure.  - Take all your medication with a sip of water prior to arrival.  - You will not be able to drive home after your procedure.

## 2015-08-26 NOTE — Progress Notes (Signed)
Patient ID: Miranda Taylor, female   DOB: 11-18-1960, 54 y.o.   MRN: [id number]        Referring  Physician: Dr. Roxy Manns Electrophysiologist: Dr. Rayann Heman Cardiologist: Dr. Renea Ee is a 54 y.o. female with a h/o PAF,recently seen by Dr. Roxy Manns for possible surgical management of possible left atrial mass and atrial fibrillation. The patient was first diagnosed with atrial fibrillation in 2013. She initially presented with increased urinary urgency and frequency related to chronic recurrent urinary tract infections. She was found to be in atrial fibrillation with rapid ventricular response at the time. She spontaneously converted to sinus rhythm and initially was treated with beta blocker therapy. She has been followed ever since intermittently by Dr. Gwenlyn Found, Dr. Rayann Heman, and Dr. Lovena Le. She does not experience symptoms of chest pain nor frequent palpitations, and she reports that her primary symptoms associated with recurrent episodes of atrial fibrillation are decreased exercise tolerance and fatigue. She has been placed on an event monitor in the past that has documented continued episodes of recurrent paroxysmal atrial fibrillation. She at times has palpitations, although these symptoms are not typically limiting or problematic. She was initially not treated with anticoagulation due to low CHADSVASC score. She was hospitalized March 29 through April 6 of this year with atrial flutter associated with rapid ventricular response. At the time she presented with symptoms of increased fatigue. She ruled out for acute myocardial infarction. She was started on Eliquis in April of thsi year  for anticoagulation and TEE was performed with intervention of proceeding with DC cardioversion. However, TEE reviewed a small mass in the left atrial appendage consistent with left atrial thrombus. Cardioversion was canceled and the patient was treated with beta blocker and diltiazem for rate control. Amiodarone  was added prior to hospital discharge. She was seen in follow-up by Dr. Lovena Le on 03/31/2015 at which time she was noted to have converted spontaneously to sinus rhythm, although she remains bradycardic while in sinus rhythm.. She was referred back to Dr. Rayann Heman to consider possible ablation for recurrent atrial fibrillation and atrial flutter. She was seen in consultation by Dr. Rayann Heman 04/10/2015. Her dose of amiodarone was decreased and diltiazem was stopped. Plans were made for elective catheter-based ablation pending results of repeat TEE. Follow-up TEE performed by Dr. Sallyanne Kuster on 05/14/2015 revealed persistent small mass in the left atrial appendage consistent with possible mass versus thrombus. The patient was referred for surgical consultation.   The pt was then seen in the afib clinic  to discuss change of anticoagulant due  to possible persistent thrombus vrs mass. The pt reports that she has not been taking Eliquis correctly, only taking once daily. This was brought to her attention when Dr. Roxy Manns asked her if she had been taking twice a day. She told him yes, but looked at the bottle when she got home and realized that even thought the bottle was labeled correctly, she had only been taking once daily. I discussed with Dr. Rayann Heman and we do not consider this as a failure of the drug.   So, she continued on Eliquis 5 mg bid, and  now returns to the afib clinic after 3 months of correctly taking eliquis  for repeatTEE. Otherwise, no complaints. Remaining in SR on amiodarone, although bradycardic, asymptomatic.   Today, she denies symptoms of palpitations, chest pain, shortness of breath, orthopnea, PND, lower extremity edema, dizziness, presyncope, syncope, or neurologic sequela. The patient is tolerating medications without difficulties and is otherwise without  complaint today.   Past Medical History  Diagnosis Date  . GERD (gastroesophageal reflux disease)   . Chronic lower back pain   .  Paroxysmal atrial fibrillation (HCC)   . Migraines   . Spinal stenosis   . SUI (stress urinary incontinence, female)   . History of kidney stones   . TMJ (temporomandibular joint syndrome)     LEFT SIDE--  WEAR  MOUTH GUARD  . H/O hiatal hernia   . Plantar fasciitis   . Typical atrial flutter ([place])   . 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 Anterior cervical decomp/discectomy fusion  04-08-2010    C4 -- C5  . Posterior fusion lumbar spine  09-16-2009    L4 -- L5  . Appendectomy  1984  . Transthoracic echocardiogram  04-03-2012    normal LVF/  ef 55-60%  . Cardiovascular stress test  04-27-2014   dr berry    normal perfusion study/  no ischemia/  ef 61%  . Lasik  1990's  . Bilateral thunb joint arthrodesis  left 2007/   right 2010  . Negative sleep study  04-15-2013  . Cystoscopy N/A 08/04/2014    Procedure: CYSTOSCOPY;  Surgeon: Ardis Hughs, MD;  Location: [facility];  Service: Urology;  Laterality: N/A;  . Pubovaginal sling N/A 08/04/2014    Procedure: B[employer] SCIENT[employer] RETRO-PUBIC MID URETHRAL SLING;  Surgeon: Ardis Hughs, MD;  Location: [facility];  Service: Urology;  Laterality: N/A;  . Tee without cardioversion N/A 02/06/2015    Procedure: TRANSESOPHAGEAL ECHOCARDIOGRAM (TEE);  Surgeon: Lelon Perla, MD;  Location: [facility] ENDOSCOPY;  Service: Cardiovascular;  Laterality: N/A;  . Tee without cardioversion N/A 05/14/2015    Procedure: TRANSESOPHAGEAL ECHOCARDIOGRAM (TEE);  Surgeon: Sanda Klein, MD;  [facility]: [facility] ENDOSCOPY;  Service: Cardiovascular;  Laterality: N/A;    Current Outpatient Prescriptions  Medication Sig Dispense Refill  . amiodarone (PACERONE) 200 MG tablet Take 1 tablet (200 mg total) by mouth daily. 30 tablet 6  . apixaban (ELIQUIS) 5 MG TABS tablet Take 1 tablet (5 mg total) by mouth 2 (two) times daily. 60 tablet 6  . baclofen (LIORESAL) 10 MG tablet Take 10 mg  by mouth 2 (two) times daily as needed.  2  . cyclobenzaprine (FLEXERIL) 10 MG tablet Take 1 tablet (10 mg total) by mouth 2 (two) times daily as needed for muscle spasms. 20 tablet 0  . desvenlafaxine (PRISTIQ) 50 MG 24 hr tablet Take 50 mg by mouth every other day.     . furosemide (LASIX) 40 MG tablet Take one tablet by mouth daily as needed for swelling 30 tablet 3  . LORazepam (ATIVAN) 0.5 MG tablet Take 0.5 mg by mouth at bedtime. For sleep    . metoprolol tartrate (LOPRESSOR) 25 MG tablet Take 1 tablet (25 mg total) by mouth 2 (two) times daily. 90 tablet 3  . Multiple Vitamin (MULITIVITAMIN WITH MINERALS) TABS Take 1 tablet by mouth daily.    Marland Kitchen NEXIUM 40 MG capsule Take 40 mg by mouth daily as needed (for heartburn).     Marland Kitchen oxyCODONE (ROXICODONE) 15 MG immediate release tablet Take 1 tablet (15 mg total) by mouth every 4 (four) hours as needed. For pain (Patient taking differently: Take 7.5-15 mg by mouth every 4 (four) hours as needed for pain. For pain) 30 tablet 0  . oxyCODONE-acetaminophen (PERCOCET/ROXICET) 5-325 MG per tablet Take 1-2 tablets by mouth  every 4 (four) hours as needed. 15 tablet 0  . potassium chloride (K-DUR) 10 MEQ tablet Take one tablet by mouth daily when taking the fluid pill for swelling 30 tablet 3  . rizatriptan (MAXALT-MLT) 10 MG disintegrating tablet Take 10 mg by mouth daily as needed for migraine.     Marland Kitchen zolpidem (AMBIEN) 5 MG tablet Take 5 mg by mouth at bedtime as needed. sleep     No current facility-administered medications for this encounter.    No Known Allergies  Social History   Social History  . Marital Status: Married    Spouse Name: N/A  . Number of Children: N/A  . Years of Education: N/A   Occupational History  . Disabled    Social History Main Topics  . Smoking status: Former Smoker -- 0.50 packs/day for 20 years    Types: Cigarettes    Quit date: 11/07/2001  . Smokeless tobacco: Never Used  . Alcohol Use: No  . Drug Use: No  .  Sexual Activity: Not on file   Other Topics Concern  . Not on file   Social History Narrative   Pt lives with husband in [place].  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 Hypertension    . Heart attack Father 45  . Heart disease Mother   . CAD Father 95  . Arrhythmia Father     ROS- All systems are reviewed and negative except as per the HPI above  Physical Exam: Filed Vitals:   08/26/15 1414  BP: 114/74  Pulse: 51  Height: 5\' 7"  (1.702 m)  Weight: 190 lb 12.8 oz (86.546 kg)    GEN- The patient is well appearing, alert and oriented x 3 today.   Head- normocephalic, atraumatic Eyes-  Sclera clear, conjunctiva pink Ears- hearing intact Oropharynx- clear Neck- supple, no JVP Lymph- no cervical lymphadenopathy Lungs- Clear to ausculation bilaterally, normal work of breathing Heart- Regular rate and rhythm, no murmurs, rubs or gallops, PMI not laterally displaced GI- soft, NT, ND, + BS Extremities- no clubbing, cyanosis, or edema MS- no significant deformity or atrophy Skin- no rash or lesion Psych- euthymic mood, full affect Neuro- strength and sensation are intact  EKG- Sinus brady at 51 bpm, pr int 180 ms, qrs 94 ms, qtc 442 ms Epic records reviewed  Assessment and Plan: 1. Possible left atrial thrombus vrs mass Pt had not been taking Eliquis 5 mg bid correctly, only taking it once daily since April, so not felt  to be a failure of drug. She has been taking correctly over the last 3 months.  She will now be scheduled for repeat TEE to see if findings are still present on TEE despite correct dosing of eliquis. If so, she has f/u with Dr. Roxy Manns in November for further management.  2. PAF Maintaining SR, although bradycardic, asymptomatic. Continues on amiodarone Awaiting news of TEE to see if she can proceed with ablation with Dr. Rayann Heman and possibly stop amiodarone  after procedure, with concerns for long term side effects.  Geroge Baseman Carroll, [facility] [address] [place], [place] [address] [phone]

## 2015-09-02 ENCOUNTER — Other Ambulatory Visit (HOSPITAL_COMMUNITY): Payer: Self-pay | Admitting: *Deleted

## 2015-09-02 ENCOUNTER — Telehealth (HOSPITAL_COMMUNITY): Payer: Self-pay | Admitting: *Deleted

## 2015-09-02 ENCOUNTER — Ambulatory Visit (HOSPITAL_COMMUNITY): Payer: BLUE CROSS/BLUE SHIELD

## 2015-09-02 ENCOUNTER — Encounter (HOSPITAL_COMMUNITY): Payer: Self-pay | Admitting: *Deleted

## 2015-09-02 ENCOUNTER — Encounter (HOSPITAL_COMMUNITY)
Admission: RE | Disposition: A | Discharge status: Home / Self Care | Payer: Self-pay | Source: Ambulatory Visit | Attending: Cardiovascular Disease

## 2015-09-02 ENCOUNTER — Ambulatory Visit (HOSPITAL_COMMUNITY)
Admission: RE | Admit: 2015-09-02 | Discharge: 2015-09-02 | Disposition: A | Discharge status: Home / Self Care | Payer: BLUE CROSS/BLUE SHIELD | Source: Ambulatory Visit | Attending: Cardiovascular Disease | Admitting: Cardiovascular Disease

## 2015-09-02 DIAGNOSIS — Z8249 Family history of ischemic heart disease and other diseases of the circulatory system: Secondary | ICD-10-CM | POA: Insufficient documentation

## 2015-09-02 DIAGNOSIS — I480 Paroxysmal atrial fibrillation: Secondary | ICD-10-CM | POA: Insufficient documentation

## 2015-09-02 DIAGNOSIS — R001 Bradycardia, unspecified: Secondary | ICD-10-CM | POA: Diagnosis not present

## 2015-09-02 DIAGNOSIS — Z87891 Personal history of nicotine dependence: Secondary | ICD-10-CM | POA: Diagnosis not present

## 2015-09-02 DIAGNOSIS — I517 Cardiomegaly: Secondary | ICD-10-CM | POA: Diagnosis present

## 2015-09-02 DIAGNOSIS — I4819 Other persistent atrial fibrillation: Secondary | ICD-10-CM

## 2015-09-02 DIAGNOSIS — Z538 Procedure and treatment not carried out for other reasons: Secondary | ICD-10-CM | POA: Diagnosis not present

## 2015-09-02 DIAGNOSIS — F419 Anxiety disorder, unspecified: Secondary | ICD-10-CM | POA: Diagnosis not present

## 2015-09-02 DIAGNOSIS — Z7901 Long term (current) use of anticoagulants: Secondary | ICD-10-CM | POA: Insufficient documentation

## 2015-09-02 HISTORY — PX: TEE WITHOUT CARDIOVERSION: SHX5443

## 2015-09-02 SURGERY — ECHOCARDIOGRAM, TRANSESOPHAGEAL
Anesthesia: Moderate Sedation

## 2015-09-02 MED ORDER — SODIUM CHLORIDE 0.9 % IV SOLN
INTRAVENOUS | Status: DC
Start: 2015-09-02 — End: 2015-09-02
  Administered 2015-09-02: 500 mL via INTRAVENOUS

## 2015-09-02 MED ORDER — OXYCODONE HCL 15 MG PO TABS: 7.5000 mg | ORAL_TABLET | ORAL | Status: DC | PRN

## 2015-09-02 MED ORDER — LIDOCAINE VISCOUS 2 % MT SOLN
OROMUCOSAL | Status: AC
  Filled 2015-09-02: qty 15

## 2015-09-02 MED ORDER — MIDAZOLAM HCL 10 MG/2ML IJ SOLN
INTRAMUSCULAR | Status: DC | PRN
  Administered 2015-09-02: 1 mg via INTRAVENOUS
  Administered 2015-09-02 (×4): 2 mg via INTRAVENOUS

## 2015-09-02 MED ORDER — MIDAZOLAM HCL 5 MG/ML IJ SOLN
INTRAMUSCULAR | Status: AC
  Filled 2015-09-02: qty 1

## 2015-09-02 MED ORDER — FENTANYL CITRATE (PF) 100 MCG/2ML IJ SOLN
INTRAMUSCULAR | Status: DC | PRN
  Administered 2015-09-02: 25 ug via INTRAVENOUS
  Administered 2015-09-02: 12.5 ug via INTRAVENOUS
  Administered 2015-09-02: 25 ug via INTRAVENOUS
  Administered 2015-09-02: 12.5 ug via INTRAVENOUS

## 2015-09-02 MED ORDER — FENTANYL CITRATE (PF) 100 MCG/2ML IJ SOLN
INTRAMUSCULAR | Status: AC
  Filled 2015-09-02: qty 2

## 2015-09-02 MED ORDER — BUTAMBEN-TETRACAINE-BENZOCAINE 2-2-14 % EX AERO
INHALATION_SPRAY | CUTANEOUS | Status: DC | PRN
  Administered 2015-09-02: 2 via TOPICAL

## 2015-09-02 NOTE — Telephone Encounter (Signed)
Pt will be rescheduled for TEE with anesthesia on 09/09/2015 patient is to arrive at 12pm for procedure start time of 1pm with Dr. Stanford Breed.  WIll have patient come in for lab work prior to procedure.

## 2015-09-02 NOTE — Interval H&P Note (Signed)
History and Physical Interval Note:  09/02/2015 9:03 AM  Miranda Taylor Vessels  has presented today for surgery, with the diagnosis of A FIB   The various methods of treatment have been discussed with the patient and family. After consideration of risks, benefits and other options for treatment, the patient has consented to  Procedure(s): TRANSESOPHAGEAL ECHOCARDIOGRAM (TEE) (N/A) as a surgical intervention .  The patient's history has been reviewed, patient examined, no change in status, stable for surgery.  I have reviewed the patient's chart and labs.  Questions were answered to the patient's satisfaction.     , R

## 2015-09-02 NOTE — CV Procedure (Signed)
    PROCEDURE NOTE:  Procedure:  Transesophageal echocardiogram Operator:  Fransico Him, MD Indications:  LA appendage mass Complications: None IV Meds:Fentanyl 139mcg, Versed 10MG   Results: Attempts were made at esophageal intubation but unsuccessful due to inadequate sedation.  She is on many drugs from pain and anxiety at home and will need to be rescheduled with anesthesia.  This was discussed with Roderic Palau, NP.  Her last TEE was done with anesthesia.  Signed: Fransico Him, MD [facility]

## 2015-09-02 NOTE — H&P (Signed)
Referring Physician: Dr. Roxy Manns Electrophysiologist: Dr. Rayann Heman Cardiologist: Dr. Renea Ee is a 54 y.o. female with a h/o PAF,recently seen by Dr. Roxy Manns for possible surgical management of possible left atrial mass and atrial fibrillation. The patient was first diagnosed with atrial fibrillation in 2013. She initially presented with increased urinary urgency and frequency related to chronic recurrent urinary tract infections. She was found to be in atrial fibrillation with rapid ventricular response at the time. She spontaneously converted to sinus rhythm and initially was treated with beta blocker therapy. She has been followed ever since intermittently by Dr. Gwenlyn Found, Dr. Rayann Heman, and Dr. Lovena Le. She does not experience symptoms of chest pain nor frequent palpitations, and she reports that her primary symptoms associated with recurrent episodes of atrial fibrillation are decreased exercise tolerance and fatigue. She has been placed on an event monitor in the past that has documented continued episodes of recurrent paroxysmal atrial fibrillation. She at times has palpitations, although these symptoms are not typically limiting or problematic. She was initially not treated with anticoagulation due to low CHADSVASC score. She was hospitalized March 29 through April 6 of this year with atrial flutter associated with rapid ventricular response. At the time she presented with symptoms of increased fatigue. She ruled out for acute myocardial infarction. She was started on Eliquis in April of thsi year for anticoagulation and TEE was performed with intervention of proceeding with DC cardioversion. However, TEE reviewed a small mass in the left atrial appendage consistent with left atrial thrombus. Cardioversion was canceled and the patient was treated with beta blocker and diltiazem for rate control. Amiodarone was added prior to hospital discharge. She was seen in follow-up by Dr. Lovena Le on  03/31/2015 at which time she was noted to have converted spontaneously to sinus rhythm, although she remains bradycardic while in sinus rhythm.. She was referred back to Dr. Rayann Heman to consider possible ablation for recurrent atrial fibrillation and atrial flutter. She was seen in consultation by Dr. Rayann Heman 04/10/2015. Her dose of amiodarone was decreased and diltiazem was stopped. Plans were made for elective catheter-based ablation pending results of repeat TEE. Follow-up TEE performed by Dr. Sallyanne Kuster on 05/14/2015 revealed persistent small mass in the left atrial appendage consistent with possible mass versus thrombus. The patient was referred for surgical consultation.   The pt was then seen in the afib clinic to discuss change of anticoagulant due to possible persistent thrombus vrs mass. The pt reports that she has not been taking Eliquis correctly, only taking once daily. This was brought to her attention when Dr. Roxy Manns asked her if she had been taking twice a day. She told him yes, but looked at the bottle when she got home and realized that even thought the bottle was labeled correctly, she had only been taking once daily. I discussed with Dr. Rayann Heman and we do not consider this as a failure of the drug.  So, she continued on Eliquis 5 mg bid, and now returns to the afib clinic after 3 months of correctly taking eliquis for repeatTEE. Otherwise, no complaints. Remaining in SR on amiodarone, although bradycardic, asymptomatic.   Today, she denies symptoms of palpitations, chest pain, shortness of breath, orthopnea, PND, lower extremity edema, dizziness, presyncope, syncope, or neurologic sequela. The patient is tolerating medications without difficulties and is otherwise without complaint today.   Past Medical History  Diagnosis Date  . GERD (gastroesophageal reflux disease)   . Chronic lower back pain   .  Paroxysmal atrial fibrillation (HCC)   . Migraines   . Spinal  stenosis   . SUI (stress urinary incontinence, female)   . History of kidney stones   . TMJ (temporomandibular joint syndrome)     LEFT SIDE-- WEAR MOUTH GUARD  . H/O hiatal hernia   . Plantar fasciitis   . Typical atrial flutter ([place])   . 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 Anterior cervical decomp/discectomy fusion  04-08-2010    C4 -- C5  . Posterior fusion lumbar spine  09-16-2009    L4 -- L5  . Appendectomy  1984  . Transthoracic echocardiogram  04-03-2012    normal LVF/ ef 55-60%  . Cardiovascular stress test  04-27-2014 dr berry    normal perfusion study/ no ischemia/ ef 61%  . Lasik  1990's  . Bilateral thunb joint arthrodesis  left 2007/ right 2010  . Negative sleep study  04-15-2013  . Cystoscopy N/A 08/04/2014    Procedure: CYSTOSCOPY; Surgeon: Ardis Hughs, MD; Location: [facility]; Service: Urology; Laterality: N/A;  . Pubovaginal sling N/A 08/04/2014    Procedure: [employer]ENT[employer] RETRO-PUBIC MID URETHRAL SLING; Surgeon: Ardis Hughs, MD; Location: [facility]; Service: Urology; Laterality: N/A;  . Tee without cardioversion N/A 02/06/2015    Procedure: TRANSESOPHAGEAL ECHOCARDIOGRAM (TEE); Surgeon: Lelon Perla, MD; Location: [facility] ENDOSCOPY; Service: Cardiovascular; Laterality: N/A;  . Tee without cardioversion N/A 05/14/2015    Procedure: TRANSESOPHAGEAL ECHOCARDIOGRAM (TEE); Surgeon: Sanda Klein, MD; Location: [facility] ENDOSCOPY; Service: Cardiovascular; Laterality: N/A;    Current Outpatient Prescriptions  Medication Sig Dispense Refill  . amiodarone (PACERONE) 200 MG tablet Take 1 tablet (200 mg total) by mouth daily. 30 tablet 6  . apixaban (ELIQUIS) 5 MG TABS tablet Take 1 tablet (5 mg total) by mouth 2 (two) times daily. 60  tablet 6  . baclofen (LIORESAL) 10 MG tablet Take 10 mg by mouth 2 (two) times daily as needed.  2  . cyclobenzaprine (FLEXERIL) 10 MG tablet Take 1 tablet (10 mg total) by mouth 2 (two) times daily as needed for muscle spasms. 20 tablet 0  . desvenlafaxine (PRISTIQ) 50 MG 24 hr tablet Take 50 mg by mouth every other day.     . furosemide (LASIX) 40 MG tablet Take one tablet by mouth daily as needed for swelling 30 tablet 3  . LORazepam (ATIVAN) 0.5 MG tablet Take 0.5 mg by mouth at bedtime. For sleep    . metoprolol tartrate (LOPRESSOR) 25 MG tablet Take 1 tablet (25 mg total) by mouth 2 (two) times daily. 90 tablet 3  . Multiple Vitamin (MULITIVITAMIN WITH MINERALS) TABS Take 1 tablet by mouth daily.    Marland Kitchen NEXIUM 40 MG capsule Take 40 mg by mouth daily as needed (for heartburn).     Marland Kitchen oxyCODONE (ROXICODONE) 15 MG immediate release tablet Take 1 tablet (15 mg total) by mouth every 4 (four) hours as needed. For pain (Patient taking differently: Take 7.5-15 mg by mouth every 4 (four) hours as needed for pain. For pain) 30 tablet 0  . oxyCODONE-acetaminophen (PERCOCET/ROXICET) 5-325 MG per tablet Take 1-2 tablets by mouth every 4 (four) hours as needed. 15 tablet 0  . potassium chloride (K-DUR) 10 MEQ tablet Take one tablet by mouth daily when taking the fluid pill for swelling 30 tablet 3  . rizatriptan (MAXALT-MLT) 10 MG disintegrating tablet Take 10 mg by mouth daily as needed for migraine.     Marland Kitchen  zolpidem (AMBIEN) 5 MG tablet Take 5 mg by mouth at bedtime as needed. sleep     No current facility-administered medications for this encounter.    No Known Allergies  Social History   Social History  . Marital Status: Married    Spouse Name: N/A  . Number of Children: N/A  . Years of Education: N/A   Occupational History  . Disabled    Social History Main Topics  . Smoking status: Former  Smoker -- 0.50 packs/day for 20 years    Types: Cigarettes    Quit date: 11/07/2001  . Smokeless tobacco: Never Used  . Alcohol Use: No  . Drug Use: No  . Sexual Activity: Not on file   Other Topics Concern  . Not on file   Social History Narrative   Pt lives with husband in [place]. 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 Hypertension    . Heart attack Father 61  . Heart disease Mother   . CAD Father 63  . Arrhythmia Father     ROS- All systems are reviewed and negative except as per the HPI above  Physical Exam: Filed Vitals:   08/26/15 1414  BP: 114/74  Pulse: 51  Height: 5\' 7"  (1.702 m)  Weight: 190 lb 12.8 oz (86.546 kg)    GEN- The patient is well appearing, alert and oriented x 3 today.  Head- normocephalic, atraumatic Eyes- Sclera clear, conjunctiva pink Ears- hearing intact Oropharynx- clear Neck- supple, no JVP Lymph- no cervical lymphadenopathy Lungs- Clear to ausculation bilaterally, normal work of breathing Heart- Regular rate and rhythm, no murmurs, rubs or gallops, PMI not laterally displaced GI- soft, NT, ND, + BS Extremities- no clubbing, cyanosis, or edema MS- no significant deformity or atrophy Skin- no rash or lesion Psych- euthymic mood, full affect Neuro- strength and sensation are intact  EKG- Sinus brady at 51 bpm, pr int 180 ms, qrs 94 ms, qtc 442 ms Epic records reviewed  Assessment and Plan: 1. Possible left atrial thrombus vrs mass Pt had not been taking Eliquis 5 mg bid correctly, only taking it once daily since April, so not felt to be a failure of drug. She has been taking correctly over the last 3 months.  She will now be scheduled for repeat TEE to see if findings are still present on TEE despite correct dosing of eliquis. If so, she has f/u with Dr.  Roxy Manns in November for further management.  2. PAF Maintaining SR, although bradycardic, asymptomatic. Continues on amiodarone Awaiting news of TEE to see if she can proceed with ablation with Dr. Rayann Heman and possibly stop amiodarone after procedure, with concerns for long term side effects.  Geroge Baseman Carroll, [facility] [address] [place], [place] [address] [phone]

## 2015-09-02 NOTE — H&P (View-Only) (Signed)
Patient ID: Miranda Taylor, female   DOB: October 06, 1961, 54 y.o.   MRN: [id number]        Referring  Physician: Dr. Roxy Manns Electrophysiologist: Dr. Rayann Heman Cardiologist: Dr. Renea Ee is a 54 y.o. female with a h/o PAF,recently seen by Dr. Roxy Manns for possible surgical management of possible left atrial mass and atrial fibrillation. The patient was first diagnosed with atrial fibrillation in 2013. She initially presented with increased urinary urgency and frequency related to chronic recurrent urinary tract infections. She was found to be in atrial fibrillation with rapid ventricular response at the time. She spontaneously converted to sinus rhythm and initially was treated with beta blocker therapy. She has been followed ever since intermittently by Dr. Gwenlyn Found, Dr. Rayann Heman, and Dr. Lovena Le. She does not experience symptoms of chest pain nor frequent palpitations, and she reports that her primary symptoms associated with recurrent episodes of atrial fibrillation are decreased exercise tolerance and fatigue. She has been placed on an event monitor in the past that has documented continued episodes of recurrent paroxysmal atrial fibrillation. She at times has palpitations, although these symptoms are not typically limiting or problematic. She was initially not treated with anticoagulation due to low CHADSVASC score. She was hospitalized March 29 through April 6 of this year with atrial flutter associated with rapid ventricular response. At the time she presented with symptoms of increased fatigue. She ruled out for acute myocardial infarction. She was started on Eliquis in April of thsi year  for anticoagulation and TEE was performed with intervention of proceeding with DC cardioversion. However, TEE reviewed a small mass in the left atrial appendage consistent with left atrial thrombus. Cardioversion was canceled and the patient was treated with beta blocker and diltiazem for rate control. Amiodarone  was added prior to hospital discharge. She was seen in follow-up by Dr. Lovena Le on 03/31/2015 at which time she was noted to have converted spontaneously to sinus rhythm, although she remains bradycardic while in sinus rhythm.. She was referred back to Dr. Rayann Heman to consider possible ablation for recurrent atrial fibrillation and atrial flutter. She was seen in consultation by Dr. Rayann Heman 04/10/2015. Her dose of amiodarone was decreased and diltiazem was stopped. Plans were made for elective catheter-based ablation pending results of repeat TEE. Follow-up TEE performed by Dr. Sallyanne Kuster on 05/14/2015 revealed persistent small mass in the left atrial appendage consistent with possible mass versus thrombus. The patient was referred for surgical consultation.   The pt was then seen in the afib clinic  to discuss change of anticoagulant due  to possible persistent thrombus vrs mass. The pt reports that she has not been taking Eliquis correctly, only taking once daily. This was brought to her attention when Dr. Roxy Manns asked her if she had been taking twice a day. She told him yes, but looked at the bottle when she got home and realized that even thought the bottle was labeled correctly, she had only been taking once daily. I discussed with Dr. Rayann Heman and we do not consider this as a failure of the drug.   So, she continued on Eliquis 5 mg bid, and  now returns to the afib clinic after 3 months of correctly taking eliquis  for repeatTEE. Otherwise, no complaints. Remaining in SR on amiodarone, although bradycardic, asymptomatic.   Today, she denies symptoms of palpitations, chest pain, shortness of breath, orthopnea, PND, lower extremity edema, dizziness, presyncope, syncope, or neurologic sequela. The patient is tolerating medications without difficulties and is otherwise without  complaint today.   Past Medical History  Diagnosis Date  . GERD (gastroesophageal reflux disease)   . Chronic lower back pain   .  Paroxysmal atrial fibrillation (HCC)   . Migraines   . Spinal stenosis   . SUI (stress urinary incontinence, female)   . History of kidney stones   . TMJ (temporomandibular joint syndrome)     LEFT SIDE--  WEAR  MOUTH GUARD  . H/O hiatal hernia   . Plantar fasciitis   . Typical atrial flutter ([place])   . 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 Anterior cervical decomp/discectomy fusion  04-08-2010    C4 -- C5  . Posterior fusion lumbar spine  09-16-2009    L4 -- L5  . Appendectomy  1984  . Transthoracic echocardiogram  04-03-2012    normal LVF/  ef 55-60%  . Cardiovascular stress test  04-27-2014   dr berry    normal perfusion study/  no ischemia/  ef 61%  . Lasik  1990's  . Bilateral thunb joint arthrodesis  left 2007/   right 2010  . Negative sleep study  04-15-2013  . Cystoscopy N/A 08/04/2014    Procedure: CYSTOSCOPY;  Surgeon: Ardis Hughs, MD;  Location: [facility];  Service: Urology;  Laterality: N/A;  . Pubovaginal sling N/A 08/04/2014    Procedure: BOSTON SCIENTIFIC RETRO-PUBIC MID URETHRAL SLING;  Surgeon: Ardis Hughs, MD;  Location: [facility];  Service: Urology;  Laterality: N/A;  . Tee without cardioversion N/A 02/06/2015    Procedure: TRANSESOPHAGEAL ECHOCARDIOGRAM (TEE);  Surgeon: Lelon Perla, MD;  Location: [facility] ENDOSCOPY;  Service: Cardiovascular;  Laterality: N/A;  . Tee without cardioversion N/A 05/14/2015    Procedure: TRANSESOPHAGEAL ECHOCARDIOGRAM (TEE);  Surgeon: Sanda Klein, MD;  Location: [facility] ENDOSCOPY;  Service: Cardiovascular;  Laterality: N/A;    Current Outpatient Prescriptions  Medication Sig Dispense Refill  . amiodarone (PACERONE) 200 MG tablet Take 1 tablet (200 mg total) by mouth daily. 30 tablet 6  . apixaban (ELIQUIS) 5 MG TABS tablet Take 1 tablet (5 mg total) by mouth 2 (two) times daily. 60 tablet 6  . baclofen (LIORESAL) 10 MG tablet Take 10 mg  by mouth 2 (two) times daily as needed.  2  . cyclobenzaprine (FLEXERIL) 10 MG tablet Take 1 tablet (10 mg total) by mouth 2 (two) times daily as needed for muscle spasms. 20 tablet 0  . desvenlafaxine (PRISTIQ) 50 MG 24 hr tablet Take 50 mg by mouth every other day.     . furosemide (LASIX) 40 MG tablet Take one tablet by mouth daily as needed for swelling 30 tablet 3  . LORazepam (ATIVAN) 0.5 MG tablet Take 0.5 mg by mouth at bedtime. For sleep    . metoprolol tartrate (LOPRESSOR) 25 MG tablet Take 1 tablet (25 mg total) by mouth 2 (two) times daily. 90 tablet 3  . Multiple Vitamin (MULITIVITAMIN WITH MINERALS) TABS Take 1 tablet by mouth daily.    Marland Kitchen NEXIUM 40 MG capsule Take 40 mg by mouth daily as needed (for heartburn).     Marland Kitchen oxyCODONE (ROXICODONE) 15 MG immediate release tablet Take 1 tablet (15 mg total) by mouth every 4 (four) hours as needed. For pain (Patient taking differently: Take 7.5-15 mg by mouth every 4 (four) hours as needed for pain. For pain) 30 tablet 0  . oxyCODONE-acetaminophen (PERCOCET/ROXICET) 5-325 MG per tablet Take 1-2 tablets by mouth  every 4 (four) hours as needed. 15 tablet 0  . potassium chloride (K-DUR) 10 MEQ tablet Take one tablet by mouth daily when taking the fluid pill for swelling 30 tablet 3  . rizatriptan (MAXALT-MLT) 10 MG disintegrating tablet Take 10 mg by mouth daily as needed for migraine.     Marland Kitchen zolpidem (AMBIEN) 5 MG tablet Take 5 mg by mouth at bedtime as needed. sleep     No current facility-administered medications for this encounter.    No Known Allergies  Social History   Social History  . Marital Status: Married    Spouse Name: N/A  . Number of Children: N/A  . Years of Education: N/A   Occupational History  . Disabled    Social History Main Topics  . Smoking status: Former Smoker -- 0.50 packs/day for 20 years    Types: Cigarettes    Quit date: 11/07/2001  . Smokeless tobacco: Never Used  . Alcohol Use: No  . Drug Use: No  .  Sexual Activity: Not on file   Other Topics Concern  . Not on file   Social History Narrative   Pt lives with husband in [place].  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 Hypertension    . Heart attack Father 40  . Heart disease Mother   . CAD Father 14  . Arrhythmia Father     ROS- All systems are reviewed and negative except as per the HPI above  Physical Exam: Filed Vitals:   08/26/15 1414  BP: 114/74  Pulse: 51  Height: 5\' 7"  (1.702 m)  Weight: 190 lb 12.8 oz (86.546 kg)    GEN- The patient is well appearing, alert and oriented x 3 today.   Head- normocephalic, atraumatic Eyes-  Sclera clear, conjunctiva pink Ears- hearing intact Oropharynx- clear Neck- supple, no JVP Lymph- no cervical lymphadenopathy Lungs- Clear to ausculation bilaterally, normal work of breathing Heart- Regular rate and rhythm, no murmurs, rubs or gallops, PMI not laterally displaced GI- soft, NT, ND, + BS Extremities- no clubbing, cyanosis, or edema MS- no significant deformity or atrophy Skin- no rash or lesion Psych- euthymic mood, full affect Neuro- strength and sensation are intact  EKG- Sinus brady at 51 bpm, pr int 180 ms, qrs 94 ms, qtc 442 ms Epic records reviewed  Assessment and Plan: 1. Possible left atrial thrombus vrs mass Pt had not been taking Eliquis 5 mg bid correctly, only taking it once daily since April, so not felt  to be a failure of drug. She has been taking correctly over the last 3 months.  She will now be scheduled for repeat TEE to see if findings are still present on TEE despite correct dosing of eliquis. If so, she has f/u with Dr. Roxy Manns in November for further management.  2. PAF Maintaining SR, although bradycardic, asymptomatic. Continues on amiodarone Awaiting news of TEE to see if she can proceed with ablation with Dr. Rayann Heman and possibly stop amiodarone  after procedure, with concerns for long term side effects.  Geroge Baseman Carroll, [facility] [address] [place], [place] [address] [phone]

## 2015-09-03 ENCOUNTER — Encounter (HOSPITAL_COMMUNITY): Payer: Self-pay | Admitting: Cardiology

## 2015-09-03 ENCOUNTER — Ambulatory Visit (HOSPITAL_COMMUNITY)
Admission: RE | Admit: 2015-09-03 | Discharge: 2015-09-03 | Disposition: A | Discharge status: Home / Self Care | Payer: BLUE CROSS/BLUE SHIELD | Source: Ambulatory Visit | Attending: Nurse Practitioner | Admitting: Nurse Practitioner

## 2015-09-03 DIAGNOSIS — I480 Paroxysmal atrial fibrillation: Secondary | ICD-10-CM | POA: Insufficient documentation

## 2015-09-03 LAB — BASIC METABOLIC PANEL
Anion gap: 8 (ref 5–15)
BUN: 16 mg/dL (ref 6–20)
CHLORIDE: 104 mmol/L (ref 101–111)
CO2: 29 mmol/L (ref 22–32)
CREATININE: 0.98 mg/dL (ref 0.44–1.00)
Calcium: 9.5 mg/dL (ref 8.9–10.3)
GFR calc non Af Amer: >60 mL/min (ref >60)
GLUCOSE: 89 mg/dL (ref 65–99)
Potassium: 4.2 mmol/L (ref 3.5–5.1)
Sodium: 141 mmol/L (ref 135–145)

## 2015-09-03 LAB — CBC
HEMATOCRIT: 38.7 % (ref 36.0–46.0)
HEMOGLOBIN: 12.2 g/dL (ref 12.0–15.0)
MCH: 27.8 pg (ref 26.0–34.0)
MCHC: 31.5 g/dL (ref 30.0–36.0)
MCV: 88.2 fL (ref 78.0–100.0)
Platelets: 205 10*3/uL (ref 150–400)
RBC: 4.39 MIL/uL (ref 3.87–5.11)
RDW: 13.6 % (ref 11.5–15.5)
WBC: 6.2 10*3/uL (ref 4.0–10.5)

## 2015-09-09 ENCOUNTER — Ambulatory Visit (HOSPITAL_BASED_OUTPATIENT_CLINIC_OR_DEPARTMENT_OTHER): Payer: BLUE CROSS/BLUE SHIELD

## 2015-09-09 ENCOUNTER — Telehealth: Payer: Self-pay | Admitting: *Deleted

## 2015-09-09 ENCOUNTER — Ambulatory Visit (HOSPITAL_COMMUNITY): Payer: BLUE CROSS/BLUE SHIELD | Admitting: Certified Registered Nurse Anesthetist

## 2015-09-09 ENCOUNTER — Ambulatory Visit (HOSPITAL_COMMUNITY)
Admission: RE | Admit: 2015-09-09 | Discharge: 2015-09-09 | Disposition: A | Discharge status: Home / Self Care | Payer: BLUE CROSS/BLUE SHIELD | Source: Ambulatory Visit | Attending: Cardiology | Admitting: Cardiology

## 2015-09-09 ENCOUNTER — Encounter (HOSPITAL_COMMUNITY): Payer: Self-pay | Admitting: *Deleted

## 2015-09-09 ENCOUNTER — Encounter (HOSPITAL_COMMUNITY)
Admission: RE | Disposition: A | Discharge status: Home / Self Care | Payer: Self-pay | Source: Ambulatory Visit | Attending: Cardiology

## 2015-09-09 DIAGNOSIS — T45516A Underdosing of anticoagulants, initial encounter: Secondary | ICD-10-CM | POA: Insufficient documentation

## 2015-09-09 DIAGNOSIS — Z87891 Personal history of nicotine dependence: Secondary | ICD-10-CM | POA: Diagnosis not present

## 2015-09-09 DIAGNOSIS — Z7901 Long term (current) use of anticoagulants: Secondary | ICD-10-CM | POA: Diagnosis not present

## 2015-09-09 DIAGNOSIS — Z981 Arthrodesis status: Secondary | ICD-10-CM | POA: Insufficient documentation

## 2015-09-09 DIAGNOSIS — I4891 Unspecified atrial fibrillation: Secondary | ICD-10-CM

## 2015-09-09 DIAGNOSIS — I480 Paroxysmal atrial fibrillation: Secondary | ICD-10-CM | POA: Diagnosis present

## 2015-09-09 DIAGNOSIS — M797 Fibromyalgia: Secondary | ICD-10-CM | POA: Insufficient documentation

## 2015-09-09 DIAGNOSIS — I4819 Other persistent atrial fibrillation: Secondary | ICD-10-CM

## 2015-09-09 DIAGNOSIS — Z79899 Other long term (current) drug therapy: Secondary | ICD-10-CM | POA: Diagnosis not present

## 2015-09-09 DIAGNOSIS — Z79891 Long term (current) use of opiate analgesic: Secondary | ICD-10-CM | POA: Diagnosis not present

## 2015-09-09 DIAGNOSIS — Z91138 Patient's unintentional underdosing of medication regimen for other reason: Secondary | ICD-10-CM | POA: Insufficient documentation

## 2015-09-09 DIAGNOSIS — I483 Typical atrial flutter: Secondary | ICD-10-CM | POA: Insufficient documentation

## 2015-09-09 DIAGNOSIS — K219 Gastro-esophageal reflux disease without esophagitis: Secondary | ICD-10-CM | POA: Insufficient documentation

## 2015-09-09 HISTORY — PX: TEE WITHOUT CARDIOVERSION: SHX5443

## 2015-09-09 SURGERY — ECHOCARDIOGRAM, TRANSESOPHAGEAL
Anesthesia: Monitor Anesthesia Care

## 2015-09-09 MED ORDER — LACTATED RINGERS IV SOLN
INTRAVENOUS | Status: DC
  Administered 2015-09-09 (×2): via INTRAVENOUS

## 2015-09-09 MED ORDER — SODIUM CHLORIDE 0.9 % IV SOLN: INTRAVENOUS | Status: DC

## 2015-09-09 MED ORDER — PROPOFOL 500 MG/50ML IV EMUL
INTRAVENOUS | Status: DC | PRN
  Administered 2015-09-09: 100 ug/kg/min via INTRAVENOUS

## 2015-09-09 MED ORDER — MIDAZOLAM HCL 5 MG/5ML IJ SOLN
INTRAMUSCULAR | Status: DC | PRN
  Administered 2015-09-09: 2 mg via INTRAVENOUS

## 2015-09-09 MED ORDER — FENTANYL CITRATE (PF) 100 MCG/2ML IJ SOLN
INTRAMUSCULAR | Status: AC
  Filled 2015-09-09: qty 2

## 2015-09-09 MED ORDER — PROPOFOL 10 MG/ML IV BOLUS
INTRAVENOUS | Status: DC | PRN
  Administered 2015-09-09 (×2): 100 mg via INTRAVENOUS

## 2015-09-09 MED ORDER — MIDAZOLAM HCL 5 MG/ML IJ SOLN
INTRAMUSCULAR | Status: AC
  Filled 2015-09-09: qty 2

## 2015-09-09 NOTE — CV Procedure (Signed)
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 Kirk Ruths

## 2015-09-09 NOTE — Discharge Instructions (Signed)

## 2015-09-09 NOTE — Anesthesia Preprocedure Evaluation (Addendum)
Anesthesia Evaluation  Patient identified by MRN, date of birth, ID band Patient awake    History of Anesthesia Complications Negative for: history of anesthetic complications  Airway Mallampati: II  TM Distance: >3 FB Neck ROM: Limited    Dental  (+) Teeth Intact, Dental Advisory Given   Pulmonary former smoker,    breath sounds clear to auscultation       Cardiovascular  Rhythm:Regular Rate:Normal     Neuro/Psych  Headaches, PSYCHIATRIC DISORDERS Depression  Neuromuscular disease    GI/Hepatic hiatal hernia, GERD  ,  Endo/Other    Renal/GU      Musculoskeletal  (+) Arthritis , Fibromyalgia -  Abdominal   Peds  Hematology   Anesthesia Other Findings   Reproductive/Obstetrics                            Anesthesia Physical Anesthesia Plan  ASA: II  Anesthesia Plan: MAC   Post-op Pain Management:    Induction:   Airway Management Planned: Nasal Cannula  Additional Equipment:   Intra-op Plan:   Post-operative Plan:   Informed Consent: I have reviewed the patients History and Physical, chart, labs and discussed the procedure including the risks, benefits and alternatives for the proposed anesthesia with the patient or authorized representative who has indicated his/her understanding and acceptance.   Dental advisory given  Plan Discussed with:   Anesthesia Plan Comments:         Anesthesia Quick Evaluation

## 2015-09-09 NOTE — Transfer of Care (Signed)
Immediate Anesthesia Transfer of Care Note  Patient: Miranda Taylor  Procedure(s) Performed: Procedure(s): TRANSESOPHAGEAL ECHOCARDIOGRAM (TEE) (N/A)  Patient Location: Endoscopy Unit  Anesthesia Type:MAC  Level of Consciousness: awake, alert  and oriented  Airway & Oxygen Therapy: Patient Spontanous Breathing and Patient connected to nasal cannula oxygen  Post-op Assessment: Report given to RN, Post -op Vital signs reviewed and stable and Patient moving all extremities  Post vital signs: Reviewed and stable  Last Vitals:  Filed Vitals:   09/09/15 1334  BP: 101/54  Pulse: 55  Temp:   Resp: 18    Complications: No apparent anesthesia complications

## 2015-09-09 NOTE — Progress Notes (Signed)
Echocardiogram Echocardiogram Transesophageal has been performed.  Miranda Taylor, Miranda Taylor 09/09/2015, 1:46 PM

## 2015-09-09 NOTE — Interval H&P Note (Signed)
History and Physical Interval Note:  09/09/2015 12:31 PM  Miranda Taylor  has presented today for surgery, with the diagnosis of A FIB  The various methods of treatment have been discussed with the patient and family. After consideration of risks, benefits and other options for treatment, the patient has consented to  Procedure(s): TRANSESOPHAGEAL ECHOCARDIOGRAM (TEE) (N/A) as a surgical intervention .  The patient's history has been reviewed, patient examined, no change in status, stable for surgery.  I have reviewed the patient's chart and labs.  Questions were answered to the patient's satisfaction.     Kirk Ruths

## 2015-09-09 NOTE — Telephone Encounter (Signed)
"  I'm a patient of Dr. Paulla Dolly.  I need to know what I need to do to set up surgery.  It's been a while since I was there."  Have you signed consent forms?  "No, I haven't signed anything."  You will need to come in for a consultation with Dr. Paulla Dolly then we can get you scheduled.  "I'd like to go ahead and get that set up."  Patient was transferred to a scheduler.

## 2015-09-09 NOTE — Anesthesia Procedure Notes (Signed)
Procedure Name: MAC Date/Time: 09/09/2015 1:07 PM Performed by: Trixie Deis A Pre-anesthesia Checklist: Patient identified, Emergency Drugs available, Suction available, Patient being monitored and Timeout performed Patient Re-evaluated:Patient Re-evaluated prior to inductionOxygen Delivery Method: Nasal cannula Placement Confirmation: positive ETCO2 Dental Injury: Teeth and Oropharynx as per pre-operative assessment

## 2015-09-09 NOTE — H&P (Signed)
DELLE ANDRZEJEWSKI  08/26/2015 2:00 PM  ATRIAL FIB OFFICE VISIT  MRN:  [id number]   Description: 54 year old female  Provider: Sherran Needs, NP  Department: [facility]       Diagnoses     PAF (paroxysmal atrial fibrillation) ([place]) - Primary    ICD-9-CM: 427.31 ICD-10-CM: I48.0       Reason for Visit     Atrial Fibrillation    Reason for Visit History        Current Vitals  Most recent update: 08/26/2015 2:14 PM by Orland Penman, CMA    BP Pulse Ht Wt BMI LMP    114/74 mmHg 51 5\' 7"  (1.702 m) 86.546 kg (190 lb 12.8 oz) 29.88 kg/m2 12/25/2012      Progress Notes      Sherran Needs, NP at 08/26/2015 4:46 PM     Status: Signed       Expand All Collapse All   Patient ID: Suzzanne Cloud, female DOB: Jan 26, 1961, 54 y.o. MRN: [id number]        Referring Physician: Dr. Roxy Manns Electrophysiologist: Dr. Rayann Heman Cardiologist: Dr. Renea Ee is a 54 y.o. female with a h/o PAF,recently seen by Dr. Roxy Manns for possible surgical management of possible left atrial mass and atrial fibrillation. The patient was first diagnosed with atrial fibrillation in 2013. She initially presented with increased urinary urgency and frequency related to chronic recurrent urinary tract infections. She was found to be in atrial fibrillation with rapid ventricular response at the time. She spontaneously converted to sinus rhythm and initially was treated with beta blocker therapy. She has been followed ever since intermittently by Dr. Gwenlyn Found, Dr. Rayann Heman, and Dr. Lovena Le. She does not experience symptoms of chest pain nor frequent palpitations, and she reports that her primary symptoms associated with recurrent episodes of atrial fibrillation are decreased exercise tolerance and fatigue. She has been placed on an event monitor in the past that has documented continued episodes of recurrent paroxysmal atrial fibrillation. She at times has palpitations, although these  symptoms are not typically limiting or problematic. She was initially not treated with anticoagulation due to low CHADSVASC score. She was hospitalized March 29 through April 6 of this year with atrial flutter associated with rapid ventricular response. At the time she presented with symptoms of increased fatigue. She ruled out for acute myocardial infarction. She was started on Eliquis in April of thsi year for anticoagulation and TEE was performed with intervention of proceeding with DC cardioversion. However, TEE reviewed a small mass in the left atrial appendage consistent with left atrial thrombus. Cardioversion was canceled and the patient was treated with beta blocker and diltiazem for rate control. Amiodarone was added prior to hospital discharge. She was seen in follow-up by Dr. Lovena Le on 03/31/2015 at which time she was noted to have converted spontaneously to sinus rhythm, although she remains bradycardic while in sinus rhythm.. She was referred back to Dr. Rayann Heman to consider possible ablation for recurrent atrial fibrillation and atrial flutter. She was seen in consultation by Dr. Rayann Heman 04/10/2015. Her dose of amiodarone was decreased and diltiazem was stopped. Plans were made for elective catheter-based ablation pending results of repeat TEE. Follow-up TEE performed by Dr. Sallyanne Kuster on 05/14/2015 revealed persistent small mass in the left atrial appendage consistent with possible mass versus thrombus. The patient was referred for surgical consultation.   The pt was then seen in the afib clinic to discuss change of anticoagulant due to possible  persistent thrombus vrs mass. The pt reports that she has not been taking Eliquis correctly, only taking once daily. This was brought to her attention when Dr. Roxy Manns asked her if she had been taking twice a day. She told him yes, but looked at the bottle when she got home and realized that even thought the bottle was labeled correctly, she had only  been taking once daily. I discussed with Dr. Rayann Heman and we do not consider this as a failure of the drug.  So, she continued on Eliquis 5 mg bid, and now returns to the afib clinic after 3 months of correctly taking eliquis for repeatTEE. Otherwise, no complaints. Remaining in SR on amiodarone, although bradycardic, asymptomatic.   Today, she denies symptoms of palpitations, chest pain, shortness of breath, orthopnea, PND, lower extremity edema, dizziness, presyncope, syncope, or neurologic sequela. The patient is tolerating medications without difficulties and is otherwise without complaint today.   Past Medical History  Diagnosis Date  . GERD (gastroesophageal reflux disease)   . Chronic lower back pain   . Paroxysmal atrial fibrillation (HCC)   . Migraines   . Spinal stenosis   . SUI (stress urinary incontinence, female)   . History of kidney stones   . TMJ (temporomandibular joint syndrome)     LEFT SIDE-- WEAR MOUTH GUARD  . H/O hiatal hernia   . Plantar fasciitis   . Typical atrial flutter ([place])   . 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 Anterior cervical decomp/discectomy fusion  04-08-2010    C4 -- C5  . Posterior fusion lumbar spine  09-16-2009    L4 -- L5  . Appendectomy  1984  . Transthoracic echocardiogram  04-03-2012    normal LVF/ ef 55-60%  . Cardiovascular stress test  04-27-2014 dr berry    normal perfusion study/ no ischemia/ ef 61%  . Lasik  1990's  . Bilateral thunb joint arthrodesis  left 2007/ right 2010  . Negative sleep study  04-15-2013  . Cystoscopy N/A 08/04/2014    Procedure: CYSTOSCOPY; Surgeon: Ardis Hughs, MD; Location: [facility]; Service: Urology; Laterality: N/A;  . Pubovaginal sling N/A 08/04/2014    Procedure: BOSTON SCIENTIFIC RETRO-PUBIC MID  URETHRAL SLING; Surgeon: Ardis Hughs, MD; Location: [facility]; Service: Urology; Laterality: N/A;  . Tee without cardioversion N/A 02/06/2015    Procedure: TRANSESOPHAGEAL ECHOCARDIOGRAM (TEE); Surgeon: Lelon Perla, MD; Location: [facility] ENDOSCOPY; Service: Cardiovascular; Laterality: N/A;  . Tee without cardioversion N/A 05/14/2015    Procedure: TRANSESOPHAGEAL ECHOCARDIOGRAM (TEE); Surgeon: Sanda Klein, MD; Location: [facility] ENDOSCOPY; Service: Cardiovascular; Laterality: N/A;    Current Outpatient Prescriptions  Medication Sig Dispense Refill  . amiodarone (PACERONE) 200 MG tablet Take 1 tablet (200 mg total) by mouth daily. 30 tablet 6  . apixaban (ELIQUIS) 5 MG TABS tablet Take 1 tablet (5 mg total) by mouth 2 (two) times daily. 60 tablet 6  . baclofen (LIORESAL) 10 MG tablet Take 10 mg by mouth 2 (two) times daily as needed.  2  . cyclobenzaprine (FLEXERIL) 10 MG tablet Take 1 tablet (10 mg total) by mouth 2 (two) times daily as needed for muscle spasms. 20 tablet 0  . desvenlafaxine (PRISTIQ) 50 MG 24 hr tablet Take 50 mg by mouth every other day.     . furosemide (LASIX) 40 MG tablet Take one tablet by mouth daily as needed for swelling 30 tablet 3  . LORazepam (ATIVAN)  0.5 MG tablet Take 0.5 mg by mouth at bedtime. For sleep    . metoprolol tartrate (LOPRESSOR) 25 MG tablet Take 1 tablet (25 mg total) by mouth 2 (two) times daily. 90 tablet 3  . Multiple Vitamin (MULITIVITAMIN WITH MINERALS) TABS Take 1 tablet by mouth daily.    Marland Kitchen NEXIUM 40 MG capsule Take 40 mg by mouth daily as needed (for heartburn).     Marland Kitchen oxyCODONE (ROXICODONE) 15 MG immediate release tablet Take 1 tablet (15 mg total) by mouth every 4 (four) hours as needed. For pain (Patient taking differently: Take 7.5-15 mg by mouth every 4 (four) hours as needed for pain. For pain) 30 tablet 0  . oxyCODONE-acetaminophen  (PERCOCET/ROXICET) 5-325 MG per tablet Take 1-2 tablets by mouth every 4 (four) hours as needed. 15 tablet 0  . potassium chloride (K-DUR) 10 MEQ tablet Take one tablet by mouth daily when taking the fluid pill for swelling 30 tablet 3  . rizatriptan (MAXALT-MLT) 10 MG disintegrating tablet Take 10 mg by mouth daily as needed for migraine.     Marland Kitchen zolpidem (AMBIEN) 5 MG tablet Take 5 mg by mouth at bedtime as needed. sleep     No current facility-administered medications for this encounter.    No Known Allergies  Social History   Social History  . Marital Status: Married    Spouse Name: N/A  . Number of Children: N/A  . Years of Education: N/A   Occupational History  . Disabled    Social History Main Topics  . Smoking status: Former Smoker -- 0.50 packs/day for 20 years    Types: Cigarettes    Quit date: 11/07/2001  . Smokeless tobacco: Never Used  . Alcohol Use: No  . Drug Use: No  . Sexual Activity: Not on file   Other Topics Concern  . Not on file   Social History Narrative   Pt lives with husband in [place]. 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 Hypertension    . Heart attack Father 19  . Heart disease Mother   . CAD Father 46  . Arrhythmia Father     ROS- All systems are reviewed and negative except as per the HPI above  Physical Exam: Filed Vitals:   08/26/15 1414  BP: 114/74  Pulse: 51  Height: 5\' 7"  (1.702 m)  Weight: 190 lb 12.8 oz (86.546 kg)    GEN- The patient is well appearing, alert and oriented x 3 today.  Head- normocephalic, atraumatic Eyes- Sclera clear, conjunctiva pink Ears- hearing intact Oropharynx- clear Neck- supple, no JVP Lymph- no cervical lymphadenopathy Lungs- Clear to ausculation bilaterally, normal  work of breathing Heart- Regular rate and rhythm, no murmurs, rubs or gallops, PMI not laterally displaced GI- soft, NT, ND, + BS Extremities- no clubbing, cyanosis, or edema MS- no significant deformity or atrophy Skin- no rash or lesion Psych- euthymic mood, full affect Neuro- strength and sensation are intact  EKG- Sinus brady at 51 bpm, pr int 180 ms, qrs 94 ms, qtc 442 ms Epic records reviewed  Assessment and Plan: 1. Possible left atrial thrombus vrs mass Pt had not been taking Eliquis 5 mg bid correctly, only taking it once daily since April, so not felt to be a failure of drug. She has been taking correctly over the last 3 months.  She will now be scheduled for repeat TEE to see  if findings are still present on TEE despite correct dosing of eliquis. If so, she has f/u with Dr. Roxy Manns in November for further management.  2. PAF Maintaining SR, although bradycardic, asymptomatic. Continues on amiodarone Awaiting news of TEE to see if she can proceed with ablation with Dr. Rayann Heman and possibly stop amiodarone after procedure, with concerns for long term side effects.  Geroge Baseman [facility] [address] [place], [place] [address] [phone]        For TEE no changes. Kirk Ruths

## 2015-09-10 ENCOUNTER — Encounter (HOSPITAL_COMMUNITY): Payer: Self-pay | Admitting: Cardiology

## 2015-09-10 NOTE — Anesthesia Postprocedure Evaluation (Signed)
  Anesthesia Post-op Note  Patient: Miranda Taylor  Procedure(s) Performed: Procedure(s): TRANSESOPHAGEAL ECHOCARDIOGRAM (TEE) (N/A)  Patient Location: PACU  Anesthesia Type:MAC  Level of Consciousness: awake  Airway and Oxygen Therapy: Patient Spontanous Breathing  Post-op Pain: mild  Post-op Assessment: Post-op Vital signs reviewed              Post-op Vital Signs: Reviewed  Last Vitals:  Filed Vitals:   09/09/15 1355  BP: 103/60  Pulse: 47  Temp:   Resp: 16    Complications: No apparent anesthesia complications

## 2015-09-17 ENCOUNTER — Encounter: Payer: Self-pay | Admitting: Podiatry

## 2015-09-17 ENCOUNTER — Ambulatory Visit (INDEPENDENT_AMBULATORY_CARE_PROVIDER_SITE_OTHER): Payer: BLUE CROSS/BLUE SHIELD | Admitting: Podiatry

## 2015-09-17 VITALS — BP 113/69 | HR 60 | Resp 12

## 2015-09-17 DIAGNOSIS — M722 Plantar fascial fibromatosis: Secondary | ICD-10-CM | POA: Diagnosis not present

## 2015-09-17 NOTE — Patient Instructions (Signed)
Pre-Operative Instructions  Congratulations, you have decided to take an important step to improving your quality of life.  You can be assured that the doctors of [facility] will be with you every step of the way.  1. Plan to be at the surgery center/hospital at least 1 (one) hour prior to your scheduled time unless otherwise directed by the surgical center/hospital staff.  You must have a responsible adult accompany you, remain during the surgery and drive you home.  Make sure you have directions to the surgical center/hospital and know how to get there on time. 2. For hospital based surgery you will need to obtain a history and physical form from your family physician within 1 month prior to the date of surgery- we will give you a form for you primary physician.  3. We make every effort to accommodate the date you request for surgery.  There are however, times where surgery dates or times have to be moved.  We will contact you as soon as possible if a change in schedule is required.   4. No Aspirin/Ibuprofen for one week before surgery.  If you are on aspirin, any non-steroidal anti-inflammatory medications (Mobic, Aleve, Ibuprofen) you should stop taking it 7 days prior to your surgery.  You make take Tylenol  For pain prior to surgery.  5. Medications- If you are taking daily heart and blood pressure medications, seizure, reflux, allergy, asthma, anxiety, pain or diabetes medications, make sure the surgery center/hospital is aware before the day of surgery so they may notify you which medications to take or avoid the day of surgery. 6. No food or drink after midnight the night before surgery unless directed otherwise by surgical center/hospital staff. 7. No alcoholic beverages 24 hours prior to surgery.  No smoking 24 hours prior to or 24 hours after surgery. 8. Wear loose pants or shorts- loose enough to fit over bandages, boots, and casts. 9. No slip on shoes, sneakers are best. 10. Bring  your boot with you to the surgery center/hospital.  Also bring crutches or a walker if your physician has prescribed it for you.  If you do not have this equipment, it will be provided for you after surgery. 11. If you have not been contracted by the surgery center/hospital by the day before your surgery, call to confirm the date and time of your surgery. 12. Leave-time from work may vary depending on the type of surgery you have.  Appropriate arrangements should be made prior to surgery with your employer. 13. Prescriptions will be provided immediately following surgery by your doctor.  Have these filled as soon as possible after surgery and take the medication as directed. 14. Remove nail polish on the operative foot. 15. Wash the night before surgery.  The night before surgery wash the foot and leg well with the antibacterial soap provided and water paying special attention to beneath the toenails and in between the toes.  Rinse thoroughly with water and dry well with a towel.  Perform this wash unless told not to do so by your physician.  Enclosed: 1 Ice pack (please put in freezer the night before surgery)   1 Hibiclens skin cleaner   Pre-op Instructions  If you have any questions regarding the instructions, do not hesitate to call our office.  [place]: [address] [place], [place] [address] [phone]  [place]: [address] [place], [place] [address] [phone]  [place]: [address]  [place], [place] [address] [phone]  Dr. Richard   Tuchman DPM, Dr. Norman Regal DPM Dr. Richard Sikora DPM, Dr. M. Todd Hyatt DPM, Dr. Kathryn Egerton DPM 

## 2015-09-20 NOTE — Progress Notes (Signed)
Subjective:     Patient ID: Miranda Taylor, female   DOB: 1961-08-12, 54 y.o.   MRN: [id number]  HPI patient presents with a very painful right heel of long-term nature that has simply not responded to conservative care and she did require surgery on her left heel   Review of Systems     Objective:   Physical Exam Neurovascular status intact muscle strength adequate with exquisite discomfort medial fascial band right at the insertional point of the tendon into the calcaneus    Assessment:     Plantar fasciitis right with inflammation and fluid buildup noted    Plan:     Reviewed condition and discussed treatment options. Due to long-standing nature and pain she is opted for surgical intervention and I allowed her to read consent form reviewing alternative treatments and complications. Patient wants surgery signs consent form after review and is given all preoperative instructions and was explained that total recovery can be from 6 months to one year. Patient was dispensed air fracture walker with instructions on usage and scheduled for outpatient surgery and encouraged to call with questions

## 2015-09-21 ENCOUNTER — Ambulatory Visit: Payer: Self-pay | Admitting: Thoracic Surgery (Cardiothoracic Vascular Surgery)

## 2015-09-23 ENCOUNTER — Telehealth: Payer: Self-pay | Admitting: *Deleted

## 2015-09-23 NOTE — Telephone Encounter (Signed)
See what blood thinner she is on in chart and send me a note so I can decide what to do

## 2015-09-23 NOTE — Telephone Encounter (Signed)
"  Just leave my surgery where it is.  I still haven't heard anything about my blood thinner whether or not to stop taking it.  When will I hear something.  Dr. Paulla Dolly said he was going to contact Dr. Gwenlyn Found."  I'll see if Dr. Paulla Dolly has contacted Dr. Gwenlyn Found and will let you know.  "So they will call me and let me know what time to arrive for surgery?"  Yes, they'll call you the Friday or Monday before.

## 2015-09-23 NOTE — Telephone Encounter (Signed)
"  I'm scheduled for surgery on 11/29.  I need to reschedule it to 12/27."  Dr. Paulla Dolly doesn't have anything available on the 27th of December.  "Can I do it 12/20?"  No, he will not be in the office that week.  "What is his next available date?"  He can do it January 3rd, 10 or 17.  "I was hoping for the 27th because my husband is off work that week.  Let me see what I can work out and give you a call back."

## 2015-09-23 NOTE — Telephone Encounter (Signed)
What is the blood thinner

## 2015-09-23 NOTE — Telephone Encounter (Signed)
Patient takes Eliquis 

## 2015-09-28 ENCOUNTER — Ambulatory Visit: Payer: Self-pay | Admitting: Thoracic Surgery (Cardiothoracic Vascular Surgery)

## 2015-09-29 NOTE — Telephone Encounter (Signed)
"  I still haven't heard when I need to stop taking the Eliquis."  Dr. Paulla Dolly is working on it.  He is trying to get in touch with Dr. Gwenlyn Found.  He said he doesn't think you'll have to stop it.  I'll call you once I get a response from Dr. Paulla Dolly.  "You said the surgical center will call me with a time or do I need to call them?"  Yes, surgical center will call you a day or two prior to surgery with the arrival time.

## 2015-10-05 ENCOUNTER — Telehealth: Payer: Self-pay | Admitting: Cardiovascular Disease

## 2015-10-05 NOTE — Telephone Encounter (Signed)
Dr. Paulla Dolly said he sent Dr. Gwenlyn Found messages about patient stopping her Eliquis.  She is scheduled to have surgery on tomorrow.  "I will send a message to the Pharmacy Tech as well as his nurse.   You should hear something from them."

## 2015-10-05 NOTE — Telephone Encounter (Signed)
Call [facility] to stop eliquis tonight and tomorrow morning

## 2015-10-05 NOTE — Telephone Encounter (Signed)
I left patient a message to not take Eliquis today nor tomorrow per Dr. Gwenlyn Found.  Will start it back Tuesday evening.  Please call when you get this message.

## 2015-10-05 NOTE — Telephone Encounter (Signed)
LMOM for patient to hold Eliquis x 2 doses.

## 2015-10-05 NOTE — Telephone Encounter (Signed)
"  I'm returning your call to let you know I got your message."  Doristine Devoid, Dr. Gwenlyn Found said no Eliquis today and tomorrow morning.  You can start taking it again tomorrow evening.

## 2015-10-05 NOTE — Telephone Encounter (Signed)
Ok to hold [employer] and tomorrow morning for surgery.  Patient needs to restart Eliquis Tuesday evening after procedure.

## 2015-10-05 NOTE — Telephone Encounter (Signed)
Dr. Paulla Dolly is wanting to know if Mrs. Dulong can Stop her Eliquis , she is having surgery on tomorrow .Marland Kitchen Please call

## 2015-10-06 ENCOUNTER — Encounter: Payer: Self-pay | Admitting: Podiatry

## 2015-10-06 DIAGNOSIS — M722 Plantar fascial fibromatosis: Secondary | ICD-10-CM | POA: Diagnosis not present

## 2015-10-12 ENCOUNTER — Ambulatory Visit (INDEPENDENT_AMBULATORY_CARE_PROVIDER_SITE_OTHER): Payer: BLUE CROSS/BLUE SHIELD | Admitting: Thoracic Surgery (Cardiothoracic Vascular Surgery)

## 2015-10-12 ENCOUNTER — Other Ambulatory Visit: Payer: Self-pay | Admitting: *Deleted

## 2015-10-12 ENCOUNTER — Encounter: Payer: Self-pay | Admitting: Thoracic Surgery (Cardiothoracic Vascular Surgery)

## 2015-10-12 VITALS — BP 120/70 | HR 59 | Resp 16 | Ht 67.0 in | Wt 180.0 lb

## 2015-10-12 DIAGNOSIS — I7409 Other arterial embolism and thrombosis of abdominal aorta: Secondary | ICD-10-CM

## 2015-10-12 DIAGNOSIS — I480 Paroxysmal atrial fibrillation: Secondary | ICD-10-CM | POA: Diagnosis not present

## 2015-10-12 DIAGNOSIS — I5189 Other ill-defined heart diseases: Secondary | ICD-10-CM | POA: Insufficient documentation

## 2015-10-12 DIAGNOSIS — R222 Localized swelling, mass and lump, trunk: Secondary | ICD-10-CM

## 2015-10-12 DIAGNOSIS — I719 Aortic aneurysm of unspecified site, without rupture: Secondary | ICD-10-CM

## 2015-10-12 NOTE — Progress Notes (Signed)
[address]       [place] [address]             [phone]     CARDIOTHORACIC SURGERY OFFICE NOTE  Referring Provider is Croitoru, Dani Gobble, MD  Primary Cardiologist is Lorretta Harp, MD PCP is Tereasa Coop, PA-C   HPI:  Patient is a 54 year old female with her history of recurrent paroxysmal atrial fibrillation and atrial flutter who returns to the office for follow-up today related to a small mass noted in the left atrium. She was originally evaluated in consultation on 05/18/2015 after she had undergone transesophageal echocardiogram In anticipation of possible catheter-based ablation for atrial fibrillation. TEE performed 05/14/2015 revealed a small mass in the left atrial appendage felt to be consistent with either thrombus or possible benign tumor. Options were discussed at that time, and a decision was made to keep the patient on anticoagulation for 3 months and return for repeat TEE. Follow-up TEE was performed 09/09/2015 and revealed that the small mass persisted despite continuous anticoagulation using Eliquis.  She returns to our office to review the results of this test and discuss options further.  The patient states that over the past few months she has been feeling well. To her knowledge she has remained in sinus rhythm. She has not developed palpitations or worsening shortness of breath. Appetite has been good. She has no chest discomfort. The remainder of her review of systems unchanged from previously.   Current Outpatient Prescriptions  Medication Sig Dispense Refill  . amiodarone (PACERONE) 200 MG tablet Take 1 tablet (200 mg total) by mouth daily. 30 tablet 6  . apixaban (ELIQUIS) 5 MG TABS tablet Take 1 tablet (5 mg total) by mouth 2 (two) times daily. 60 tablet 6  . cyclobenzaprine (FLEXERIL) 10 MG tablet Take 1 tablet (10 mg total) by mouth 2 (two) times daily as needed for muscle spasms. 20 tablet 0  . desvenlafaxine (PRISTIQ) 50 MG 24  hr tablet Take 50 mg by mouth every other day.     . furosemide (LASIX) 40 MG tablet Take one tablet by mouth daily as needed for swelling (Patient taking differently: Take 40 mg by mouth daily as needed (for swelling). ) 30 tablet 3  . LORazepam (ATIVAN) 0.5 MG tablet Take 0.5 mg by mouth at bedtime.     . metoprolol tartrate (LOPRESSOR) 25 MG tablet Take 1 tablet (25 mg total) by mouth 2 (two) times daily. 90 tablet 3  . Multiple Vitamin (MULITIVITAMIN WITH MINERALS) TABS Take 1 tablet by mouth daily.    Marland Kitchen NEXIUM 40 MG capsule Take 40 mg by mouth daily as needed (for heartburn).     Marland Kitchen oxyCODONE (ROXICODONE) 15 MG immediate release tablet Take 0.5-1 tablets (7.5-15 mg total) by mouth every 4 (four) hours as needed for pain. For pain 30 tablet 0  . oxyCODONE-acetaminophen (PERCOCET/ROXICET) 5-325 MG per tablet Take 1-2 tablets by mouth every 4 (four) hours as needed. 15 tablet 0  . potassium chloride (K-DUR) 10 MEQ tablet Take one tablet by mouth daily when taking the fluid pill for swelling (Patient taking differently: Take 10 mEq by mouth daily as needed (with lasix). ) 30 tablet 3  . rizatriptan (MAXALT-MLT) 10 MG disintegrating tablet Take 10 mg by mouth daily as needed for migraine.     Marland Kitchen zolpidem (AMBIEN) 5 MG tablet Take 5 mg by mouth at bedtime as needed for sleep.      No current facility-administered  medications for this visit.      Physical Exam:   BP 120/70 mmHg  Pulse 59  Resp 16  Ht 5\' 7"  (1.702 m)  Wt 180 lb (81.647 kg)  BMI 28.19 kg/m2  SpO2 98%  LMP 12/25/2012  General:  Well-appearing  Chest:   Clear to auscultation  CV:   Regular rate and rhythm without murmur  Incisions:  n/a  Abdomen:  Soft and nontender  Extremities:  Warm and well-perfused  Diagnostic Tests:  Transesophageal Echocardiography  Patient:  Miranda Taylor, Miranda Taylor MR #:    [id number] Study Date: 09/09/2015 Gender:   F Age:    58 Height:   170.2 cm Weight:   85.5 kg BSA:    2.03  m^2 Pt. Status: Room:    [place]  Sherran Needs SONOGRAPHER Jimmy Reel, RDCS  cc:  ------------------------------------------------------------------- LV EF: 55% -  60%  ------------------------------------------------------------------- Indications:   Atrial fibrillation - 427.31.  ------------------------------------------------------------------- Study Conclusions  - Left ventricle: Systolic function was normal. The estimated ejection fraction was in the range of 55% to 60%. Wall motion was normal; there were no regional wall motion abnormalities. - Aortic valve: No evidence of vegetation. There was trivial regurgitation. - Mitral valve: No evidence of vegetation. - Right atrium: No evidence of thrombus in the atrial cavity or appendage. - Atrial septum: No defect or patent foramen ovale was identified. - Tricuspid valve: No evidence of vegetation. - Pulmonic valve: No evidence of vegetation.  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  Diagnostic transesophageal echocardiography. 2D and color Doppler. Birthdate: Patient birthdate: [birth year]. Age: Patient is 54 yr old. Sex: Gender: female.  BMI: 29.5 kg/m^2. Blood pressure: 102/68 Patient status: Outpatient. Study date: Study date: 09/09/2015. Study time: 01:00 PM. Location: Endoscopy.  -------------------------------------------------------------------  ------------------------------------------------------------------- Left ventricle: Systolic function was normal. The estimated ejection fraction was in the range of 55% to 60%. Wall motion was normal; there were no regional wall motion  abnormalities.  ------------------------------------------------------------------- Aortic valve:  Structurally normal valve.  Cusp separation was normal. No evidence of vegetation. Doppler: There was trivial regurgitation.  ------------------------------------------------------------------- Aorta: Descending aorta: The descending aorta had mild diffuse disease.  ------------------------------------------------------------------- Mitral valve:  Structurally normal valve.  Leaflet separation was normal. No evidence of vegetation. Doppler: There was trivial regurgitation.  ------------------------------------------------------------------- Left atrium: The atrium was normal in size.  ------------------------------------------------------------------- Atrial septum: No defect or patent foramen ovale was identified.  ------------------------------------------------------------------- Right ventricle: The cavity size was normal. Systolic function was normal.  ------------------------------------------------------------------- Pulmonic valve:  Structurally normal valve.  Cusp separation was normal. No evidence of vegetation. Doppler: There was trivial regurgitation.  ------------------------------------------------------------------- Tricuspid valve:  Structurally normal valve.  Leaflet separation was normal. No evidence of vegetation. Doppler: There was mild regurgitation.  ------------------------------------------------------------------- Right atrium: The atrium was normal in size. No evidence of thrombus in the atrial cavity or appendage.  ------------------------------------------------------------------- Pericardium: There was no pericardial effusion.  ------------------------------------------------------------------- Prepared and Electronically Authenticated by  Kirk Ruths 2016-11-02T17:38:18   Impression:  I have personally  reviewed the patient's recent follow-up transesophageal echocardiogram.  Patient has a small mass adherent to the endocardial surface of the left atrium along the superior ridge of the left atrial appendage adjacent to the orifice of the left inferior pulmonary vein. 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 This mass has persisted despite several months of continuous anticoagulation.  I feel the patient would best be treated with surgical excision of this mass to decrease her risk of stroke. Concomitant Maze procedure could be considered at the time of surgery because of the patient's history of recurrent paroxysmal atrial fibrillation.    Plan:  I discussed the indications, risks, and potential benefits of surgical resection of the small left atrial mass with the patient in the office today.  Alternative treatment strategies been discussed including concerns regarding the long-term risk of embolism and stroke.  The patient understands and accepts all potential associated risks of surgery including but not limited to risk of death, stroke, myocardial infarction, congestive heart failure, respiratory failure, renal failure, bleeding requiring blood transfusion and/or reexploration, arrhythmia, heart block or bradycardia requiring permanent pacemaker, pneumonia, pleural effusion, wound infection, pulmonary embolus or other thromboembolic complication, chronic pain or other delayed complications.  The relative risks and benefits of performing a maze procedure at the time of her surgery was discussed at length, including the expected likelihood of long term freedom from recurrent symptomatic atrial fibrillation and/or atrial flutter.  Alternative surgical approaches have been discussed including a comparison between conventional sternotomy and minimally-invasive techniques.  The relative risks and benefits of each have been reviewed as they pertain  to the patient's specific circumstances, and all of their questions have been addressed.  Specific risks potentially related to the minimally-invasive approach were discussed at length, including but not limited to risk of conversion to full or partial sternotomy, aortic dissection or other major vascular complication, unilateral acute lung injury or pulmonary edema, phrenic nerve dysfunction or paralysis, rib fracture, chronic pain, lung hernia, or lymphocele.  The patient is interested in proceeding with surgery, but she desires to wait until after the holidays have passed. She understands that there may be a small but significant risk of embolization and stroke during the interim period of time. She will need to undergo diagnostic cardiac catheterization to rule out the presence of coronary artery disease. We will contact Dr. Kennon Holter office to arrange for cath as soon as practical. The patient will additionally undergo CT angiography to further characterize the feasibility of peripheral cannulation for minimally invasive approach for surgery. All of her questions have been addressed.     I spent in excess of 30 minutes during the conduct of this office consultation and >50% of this time involved direct face-to-face encounter with the patient for counseling and/or coordination of their care.    Miranda Gu. Roxy Manns, MD 10/12/2015 3:37 PM

## 2015-10-12 NOTE — Patient Instructions (Signed)
Continue all previous medications without any changes at this time  

## 2015-10-14 ENCOUNTER — Telehealth: Payer: Self-pay

## 2015-10-14 DIAGNOSIS — I480 Paroxysmal atrial fibrillation: Secondary | ICD-10-CM

## 2015-10-14 DIAGNOSIS — I5032 Chronic diastolic (congestive) heart failure: Secondary | ICD-10-CM

## 2015-10-14 NOTE — Telephone Encounter (Signed)
Spoke with pt to schedule a cath with Dr. Gwenlyn Found. She is aware she needs to have appt with NP or PA before for H&P and blood work.  Pt to call back to schedule.

## 2015-10-14 NOTE — Telephone Encounter (Signed)
Pt scheduled to see Rosaria Ferries, PA on 12/12 at 9:30am at [place] office.  Pt aware she will need blood work and can get it done that day.  Pt to pick up letter with details of cath scheduled for 12/15 @ 9:30am with Dr. Gwenlyn Found.  Orders placed for pre cath labs, pt to pick up on 12/12 with appt.

## 2015-10-14 NOTE — Telephone Encounter (Signed)
Returning your call. °

## 2015-10-15 ENCOUNTER — Ambulatory Visit (INDEPENDENT_AMBULATORY_CARE_PROVIDER_SITE_OTHER): Payer: BLUE CROSS/BLUE SHIELD | Admitting: Podiatry

## 2015-10-15 DIAGNOSIS — M722 Plantar fascial fibromatosis: Secondary | ICD-10-CM | POA: Diagnosis not present

## 2015-10-15 DIAGNOSIS — Z9889 Other specified postprocedural states: Secondary | ICD-10-CM

## 2015-10-16 ENCOUNTER — Other Ambulatory Visit: Payer: Self-pay

## 2015-10-18 NOTE — Progress Notes (Signed)
Subjective:     Patient ID: Miranda Taylor, female   DOB: 10-31-1961, 54 y.o.   MRN: [id number]  HPI patient states I'm doing very well with my right foot with minimal swelling or pain   Review of Systems     Objective:   Physical Exam Neurovascular status intact negative Homans sign noted with wound edges well coapted medial lateral side heel right with stitches in place and no drainage    Assessment:     Doing well post endoscopic surgery    Plan:     Advised this patient on continued immobilization elevation and gradual increase in activity level. Reapplied sterile dressing and reappoint 2 weeks suture removal or earlier if any issues should occur

## 2015-10-19 ENCOUNTER — Ambulatory Visit (INDEPENDENT_AMBULATORY_CARE_PROVIDER_SITE_OTHER): Payer: BLUE CROSS/BLUE SHIELD | Admitting: Physician Assistant

## 2015-10-19 ENCOUNTER — Other Ambulatory Visit: Payer: Self-pay | Admitting: Internal Medicine

## 2015-10-19 ENCOUNTER — Encounter: Payer: Self-pay | Admitting: Cardiovascular Disease

## 2015-10-19 ENCOUNTER — Encounter: Payer: Self-pay | Admitting: Physician Assistant

## 2015-10-19 ENCOUNTER — Other Ambulatory Visit: Payer: Self-pay | Admitting: Physician Assistant

## 2015-10-19 ENCOUNTER — Other Ambulatory Visit: Payer: Self-pay | Admitting: Nurse Practitioner

## 2015-10-19 VITALS — BP 118/76 | HR 53 | Ht 67.0 in | Wt 185.6 lb

## 2015-10-19 DIAGNOSIS — I480 Paroxysmal atrial fibrillation: Secondary | ICD-10-CM

## 2015-10-19 DIAGNOSIS — R222 Localized swelling, mass and lump, trunk: Secondary | ICD-10-CM | POA: Diagnosis not present

## 2015-10-19 DIAGNOSIS — R0609 Other forms of dyspnea: Secondary | ICD-10-CM

## 2015-10-19 DIAGNOSIS — I5189 Other ill-defined heart diseases: Secondary | ICD-10-CM

## 2015-10-19 MED ORDER — APIXABAN 5 MG PO TABS
5.0000 mg | ORAL_TABLET | Freq: Two times a day (BID) | ORAL | Status: DC
Start: 2015-10-19 — End: 2016-05-18

## 2015-10-19 MED ORDER — AMIODARONE HCL 200 MG PO TABS: 200.0000 mg | ORAL_TABLET | Freq: Every day | ORAL | Status: DC

## 2015-10-19 MED ORDER — METOPROLOL TARTRATE 25 MG PO TABS: 25.0000 mg | ORAL_TABLET | Freq: Two times a day (BID) | ORAL | Status: DC

## 2015-10-19 NOTE — Patient Instructions (Signed)
MOVE CARDIAC CATH TO THE FIRST WEEK OF January AT THE PATIENT'S REQUEST DUE TO TRANSPORTATION ISSUES.  HAVE LAB WORK DONE 5-7 DAYS PRIOR TO THE CATH AT [facility].

## 2015-10-19 NOTE — Progress Notes (Signed)
Cardiology Office Note   Date:  10/19/2015   ID:  Miranda Taylor, Miranda Taylor [birth year], MRN [id number]  PCP:  Tereasa Coop, PA-C  Cardiologist:  Dr Gwenlyn Found, Dr Madolyn Frieze, PA-C   Chief Complaint  Patient presents with  . Follow-up    pt states no Sx//would like to reschedule CATH for after the first of the year--supposed to be 12/15    History of Present Illness: Miranda Taylor is a 54 y.o. female with a history of PAF, flutter, small mass LAA, surgical resection planned. Needs cath first.  Miranda Taylor presents for preoperative evaluation.  She never gets chest pain. However, she has chronic dyspnea on exertion. She is extremely busy, doing things with 2 foster children and her grandchildren. However, she does not exercise regularly. She notices that while her energy level is good and she doesn't mind the days being busy, she does have some dyspnea on exertion. She is not sure how far she can walk without stopping. She admits that she does not know how far she can walk without stopping. She has not had lower extremity edema, orthopnea or PND. She has not had palpitations and doesn't think she has had any recent atrial fibrillation.  Because of scheduling issues around the holidays, she wishes to delay the heart catheterization until the first year. She sees the surgeon again on January 9. She is compliant with her amiodarone, metoprolol and her Eliquis. She is aware that she has bradycardia and says it is from the metoprolol, but does not have any symptoms she attributes to this.         Past Medical History  Diagnosis Date  . GERD (gastroesophageal reflux disease)   . Chronic lower back pain   . Paroxysmal atrial fibrillation (HCC)   . Migraines   . Spinal stenosis   . SUI (stress urinary incontinence, female)   . History of kidney stones   . TMJ (temporomandibular joint syndrome)     LEFT SIDE--  WEAR  MOUTH GUARD  . H/O hiatal hernia   . Plantar fasciitis   .  Typical atrial flutter ([place])   . 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 Anterior cervical decomp/discectomy fusion  04-08-2010    C4 -- C5  . Posterior fusion lumbar spine  09-16-2009    L4 -- L5  . Appendectomy  1984  . Transthoracic echocardiogram  04-03-2012    normal LVF/  ef 55-60%  . Cardiovascular stress test  04-27-2014   dr berry    normal perfusion study/  no ischemia/  ef 61%  . Lasik  1990's  . Bilateral thunb joint arthrodesis  left 2007/   right 2010  . Negative sleep study  04-15-2013  . Cystoscopy N/A 08/04/2014    Procedure: CYSTOSCOPY;  Surgeon: Ardis Hughs, MD;  Location: [facility];  Service: Urology;  Laterality: N/A;  . Pubovaginal sling N/A 08/04/2014    Procedure: BOSTON SCIENTIFIC RETRO-PUBIC MID URETHRAL SLING;  Surgeon: Ardis Hughs, MD;  Location: [facility];  Service: Urology;  Laterality: N/A;  . Tee without cardioversion N/A 02/06/2015    Procedure: TRANSESOPHAGEAL ECHOCARDIOGRAM (TEE);  Surgeon: Lelon Perla, MD;  Location: [facility] ENDOSCOPY;  Service: Cardiovascular;  Laterality: N/A;  . Tee without cardioversion N/A 05/14/2015    Procedure: TRANSESOPHAGEAL ECHOCARDIOGRAM (TEE);  Surgeon: Sanda Klein, MD;  Location: [facility];  Service:  Cardiovascular;  Laterality: N/A;  . Tee without cardioversion N/A 09/02/2015    Procedure: TRANSESOPHAGEAL ECHOCARDIOGRAM (TEE);  Surgeon: Sueanne Margarita, MD;  Location[facility] END[facility]COPY;  Service: Cardiovascular;  Laterality: N/A;  . Tee without cardioversion N/A 09/09/2015    Procedure: TRANSESOPHAGEAL ECHOCARDIOGRAM (TEE);  Surgeon: Lelon Perla, MD;  Location[facility]COPY;  Service: Cardiovascular;  Laterality: N/A;    Current Outpatient Prescriptions  Medication Sig Dispense Refill  . amiodarone (PACERONE) 200 MG tablet Take 1 tablet (200 mg total) by mouth daily. 30 tablet 6  . apixaban (ELIQUIS) 5 MG TABS tablet  Take 1 tablet (5 mg total) by mouth 2 (two) times daily. 60 tablet 6  . cyclobenzaprine (FLEXERIL) 10 MG tablet Take 1 tablet (10 mg total) by mouth 2 (two) times daily as needed for muscle spasms. 20 tablet 0  . desvenlafaxine (PRISTIQ) 50 MG 24 hr tablet Take 50 mg by mouth every other day.     . furosemide (LASIX) 40 MG tablet Take one tablet by mouth daily as needed for swelling (Patient taking differently: Take 40 mg by mouth daily as needed (for swelling). ) 30 tablet 3  . LORazepam (ATIVAN) 0.5 MG tablet Take 0.5 mg by mouth at bedtime.     . metoprolol tartrate (LOPRESSOR) 25 MG tablet Take 1 tablet (25 mg total) by mouth 2 (two) times daily. 90 tablet 3  . Multiple Vitamin (MULITIVITAMIN WITH MINERALS) TABS Take 1 tablet by mouth daily.    Marland Kitchen NEXIUM 40 MG capsule Take 40 mg by mouth daily as needed (for heartburn).     Marland Kitchen oxyCODONE (ROXICODONE) 15 MG immediate release tablet Take 0.5-1 tablets (7.5-15 mg total) by mouth every 4 (four) hours as needed for pain. For pain 30 tablet 0  . oxyCODONE-acetaminophen (PERCOCET/ROXICET) 5-325 MG per tablet Take 1-2 tablets by mouth every 4 (four) hours as needed. 15 tablet 0  . potassium chloride (K-DUR) 10 MEQ tablet Take one tablet by mouth daily when taking the fluid pill for swelling (Patient taking differently: Take 10 mEq by mouth daily as needed (with lasix). ) 30 tablet 3  . rizatriptan (MAXALT-MLT) 10 MG disintegrating tablet Take 10 mg by mouth daily as needed for migraine.     Marland Kitchen zolpidem (AMBIEN) 5 MG tablet Take 5 mg by mouth at bedtime as needed for sleep.      No current facility-administered medications for this visit.    Allergies:   Review of patient's allergies indicates no known allergies.    Social History:  The patient  reports that she quit smoking about 13 years ago. Her smoking use included Cigarettes. She has a 10 pack-year smoking history. She has never used smokeless tobacco. She reports that she does not drink alcohol or  use illicit drugs.   Family History:  The patient's family history includes Arrhythmia in her father; CAD (age of onset: 78) in her father; Heart attack (age of onset: 64) in her father; Heart disease in her mother; Hypertension in an other family member.    ROS:  Please see the history of present illness. All other systems are reviewed and negative.    PHYSICAL EXAM: VS:  BP 118/76 mmHg  Pulse 53  Ht 5\' 7"  (1.702 m)  Wt 185 lb 9.6 oz (84.188 kg)  BMI 29.06 kg/m2  LMP 12/25/2012 , BMI Body mass index is 29.06 kg/(m^2). GEN: Well nourished, well developed, female in no acute distress HEENT: normal for age  Neck: no JVD, no  carotid bruit, no masses Cardiac: RRR; soft murmur, no rubs, or gallops Respiratory:  clear to auscultation bilaterally, normal work of breathing GI: soft, nontender, nondistended, + BS MS: no deformity or atrophy; no edema; distal pulses are 2+ in all 4 extremities  Skin: warm and dry, no rash Neuro:  Strength and sensation are intact Psych: euthymic mood, full affect   EKG:  EKG is ordered today. The ekg ordered today demonstrates sinus bradycardia, rate 53, no acute ischemic changes   Recent Labs: 02/03/2015: B Natriuretic Peptide 366.7*; TSH 3.081 02/04/2015: ALT 51* 02/05/2015: Magnesium 2.1 09/03/2015: BUN 16; Creatinine, Ser 0.98; Hemoglobin 12.2; Platelets 205; Potassium 4.2; Sodium 141    Lipid Panel    Component Value Date/Time   CHOL 180 10/15/2014 0939   TRIG 58 10/15/2014 0939   HDL 62 10/15/2014 0939   CHOLHDL 2.9 10/15/2014 0939   VLDL 12 10/15/2014 0939   LDLCALC 106* 10/15/2014 0939     Wt Readings from Last 3 Encounters:  10/19/15 185 lb 9.6 oz (84.188 kg)  10/12/15 180 lb (81.647 kg)  08/26/15 190 lb 12.8 oz (86.546 kg)     Other studies Reviewed: Additional studies/ records that were reviewed today include: Previous office notes, [facility] consult note.  ASSESSMENT AND PLAN:  1.  Dyspnea on exertion: She has no volume overload  by exam. She has no history of heart failure or left ventricular dysfunction. With regard to her upcoming surgery, we will evaluate the dyspnea on exertion with a cardiac catheterization. As her symptoms have not changed acutely, we can defer this until after the first of the year. As she has no chest pain, we will not add aspirin. She is starting on a beta blocker.    2. Paroxysmal atrial fibrillation: When in sinus rhythm, she is bradycardic because of the metoprolol and amiodarone. She has not had recent palpitations, so we will continue the medications current dosing.  3. Chronic anticoagulation: She is compliant with her Eliquis taking it twice daily. Her CHADS2VASC=1 (female).  4. Left atrial mass: At first this was thought to be thrombus, but did not resolve after 3 months of taking Eliquis twice a day. She has been seen by Dr. Roxy Manns and surgical removal is planned, minimally invasive. She sees him again in January.  Current medicines are reviewed at length with the patient today.  The patient does not have concerns regarding medicines.  The following changes have been made:  no change  Labs/ tests ordered today include: Cardiac cath rescheduled    Disposition:   FU withDr. Gwenlyn Found and Dr. Lovena Le as scheduled  Signed, Lenoard Aden  10/19/2015 9:41 AM    [address], [place], [place]  [id number] Phone: [phone]; Fax: [phone]

## 2015-10-21 ENCOUNTER — Other Ambulatory Visit: Payer: Self-pay

## 2015-10-27 ENCOUNTER — Emergency Department (HOSPITAL_BASED_OUTPATIENT_CLINIC_OR_DEPARTMENT_OTHER)
Admission: EM | Admit: 2015-10-27 | Discharge: 2015-10-27 | Disposition: A | Discharge status: Home / Self Care | Payer: BLUE CROSS/BLUE SHIELD | Attending: Emergency Medicine | Admitting: Emergency Medicine

## 2015-10-27 ENCOUNTER — Emergency Department (HOSPITAL_BASED_OUTPATIENT_CLINIC_OR_DEPARTMENT_OTHER): Payer: BLUE CROSS/BLUE SHIELD

## 2015-10-27 ENCOUNTER — Encounter (HOSPITAL_BASED_OUTPATIENT_CLINIC_OR_DEPARTMENT_OTHER): Payer: Self-pay | Admitting: *Deleted

## 2015-10-27 DIAGNOSIS — Z86718 Personal history of other venous thrombosis and embolism: Secondary | ICD-10-CM | POA: Diagnosis not present

## 2015-10-27 DIAGNOSIS — G8929 Other chronic pain: Secondary | ICD-10-CM | POA: Diagnosis not present

## 2015-10-27 DIAGNOSIS — Z79899 Other long term (current) drug therapy: Secondary | ICD-10-CM | POA: Diagnosis not present

## 2015-10-27 DIAGNOSIS — I480 Paroxysmal atrial fibrillation: Secondary | ICD-10-CM | POA: Diagnosis not present

## 2015-10-27 DIAGNOSIS — Z8739 Personal history of other diseases of the musculoskeletal system and connective tissue: Secondary | ICD-10-CM | POA: Diagnosis not present

## 2015-10-27 DIAGNOSIS — N12 Tubulo-interstitial nephritis, not specified as acute or chronic: Secondary | ICD-10-CM

## 2015-10-27 DIAGNOSIS — K219 Gastro-esophageal reflux disease without esophagitis: Secondary | ICD-10-CM | POA: Insufficient documentation

## 2015-10-27 DIAGNOSIS — Z87891 Personal history of nicotine dependence: Secondary | ICD-10-CM | POA: Diagnosis not present

## 2015-10-27 DIAGNOSIS — G43909 Migraine, unspecified, not intractable, without status migrainosus: Secondary | ICD-10-CM | POA: Insufficient documentation

## 2015-10-27 DIAGNOSIS — Z7902 Long term (current) use of antithrombotics/antiplatelets: Secondary | ICD-10-CM | POA: Diagnosis not present

## 2015-10-27 DIAGNOSIS — Z87442 Personal history of urinary calculi: Secondary | ICD-10-CM | POA: Diagnosis not present

## 2015-10-27 DIAGNOSIS — R109 Unspecified abdominal pain: Secondary | ICD-10-CM | POA: Diagnosis present

## 2015-10-27 LAB — COMPREHENSIVE METABOLIC PANEL
ALK PHOS: 98 U/L (ref 38–126)
ALT: 13 U/L — ABNORMAL LOW (ref 14–54)
ANION GAP: 5 (ref 5–15)
AST: 15 U/L (ref 15–41)
Albumin: 3.6 g/dL (ref 3.5–5.0)
BILIRUBIN TOTAL: 0.5 mg/dL (ref 0.3–1.2)
BUN: 19 mg/dL (ref 6–20)
CALCIUM: 9.1 mg/dL (ref 8.9–10.3)
CO2: 27 mmol/L (ref 22–32)
Chloride: 107 mmol/L (ref 101–111)
Creatinine, Ser: 0.92 mg/dL (ref 0.44–1.00)
Glucose, Bld: 111 mg/dL — ABNORMAL HIGH (ref 65–99)
Potassium: 3.9 mmol/L (ref 3.5–5.1)
Sodium: 139 mmol/L (ref 135–145)
TOTAL PROTEIN: 7 g/dL (ref 6.5–8.1)

## 2015-10-27 LAB — URINALYSIS, ROUTINE W REFLEX MICROSCOPIC
Glucose, UA: NEGATIVE mg/dL
Ketones, ur: 15 mg/dL — AB
NITRITE: NEGATIVE
PH: 5.5 (ref 5.0–8.0)
Protein, ur: NEGATIVE mg/dL
SPECIFIC GRAVITY, URINE: 1.026 (ref 1.005–1.030)

## 2015-10-27 LAB — LIPASE, BLOOD: Lipase: 24 U/L (ref 11–51)

## 2015-10-27 LAB — CBC WITH DIFFERENTIAL/PLATELET
Basophils Absolute: 0 10*3/uL (ref 0.0–0.1)
Basophils Relative: 0 %
EOS ABS: 0.1 10*3/uL (ref 0.0–0.7)
Eosinophils Relative: 1 %
HEMATOCRIT: 38.8 % (ref 36.0–46.0)
HEMOGLOBIN: 12.3 g/dL (ref 12.0–15.0)
LYMPHS ABS: 2.1 10*3/uL (ref 0.7–4.0)
Lymphocytes Relative: 23 %
MCH: 27.5 pg (ref 26.0–34.0)
MCHC: 31.7 g/dL (ref 30.0–36.0)
MCV: 86.6 fL (ref 78.0–100.0)
MONOS PCT: 5 %
Monocytes Absolute: 0.5 10*3/uL (ref 0.1–1.0)
NEUTROS PCT: 71 %
Neutro Abs: 6.6 10*3/uL (ref 1.7–7.7)
Platelets: 208 10*3/uL (ref 150–400)
RBC: 4.48 MIL/uL (ref 3.87–5.11)
RDW: 14.1 % (ref 11.5–15.5)
WBC: 9.3 10*3/uL (ref 4.0–10.5)

## 2015-10-27 LAB — URINE MICROSCOPIC-ADD ON

## 2015-10-27 MED ORDER — DEXTROSE 5 % IV SOLN
1.0000 g | Freq: Once | INTRAVENOUS | Status: AC
  Administered 2015-10-27: 1 g via INTRAVENOUS

## 2015-10-27 MED ORDER — HYDROMORPHONE HCL 1 MG/ML IJ SOLN
1.0000 mg | Freq: Once | INTRAMUSCULAR | Status: AC
  Administered 2015-10-27: 1 mg via INTRAVENOUS
  Filled 2015-10-27: qty 1

## 2015-10-27 MED ORDER — CEFTRIAXONE SODIUM 1 G IJ SOLR
INTRAMUSCULAR | Status: AC
  Filled 2015-10-27: qty 10

## 2015-10-27 MED ORDER — CEPHALEXIN 500 MG PO CAPS: 500.0000 mg | ORAL_CAPSULE | Freq: Four times a day (QID) | ORAL | Status: DC

## 2015-10-27 MED ORDER — ONDANSETRON 8 MG PO TBDP: 8.0000 mg | ORAL_TABLET | Freq: Three times a day (TID) | ORAL | Status: DC | PRN

## 2015-10-27 MED ORDER — PROMETHAZINE HCL 25 MG/ML IJ SOLN
12.5000 mg | Freq: Once | INTRAMUSCULAR | Status: AC
  Administered 2015-10-27: 12.5 mg via INTRAVENOUS
  Filled 2015-10-27: qty 1

## 2015-10-27 MED ORDER — KETOROLAC TROMETHAMINE 30 MG/ML IJ SOLN
15.0000 mg | Freq: Once | INTRAMUSCULAR | Status: AC
  Administered 2015-10-27: 15 mg via INTRAVENOUS
  Filled 2015-10-27: qty 1

## 2015-10-27 MED ORDER — OXYCODONE-ACETAMINOPHEN 5-325 MG PO TABS: 1.0000 | ORAL_TABLET | ORAL | Status: DC | PRN

## 2015-10-27 MED ORDER — ONDANSETRON HCL 4 MG/2ML IJ SOLN
4.0000 mg | Freq: Once | INTRAMUSCULAR | Status: AC
  Administered 2015-10-27: 4 mg via INTRAVENOUS
  Filled 2015-10-27: qty 2

## 2015-10-27 MED ORDER — SODIUM CHLORIDE 0.9 % IV SOLN
1000.0000 mL | Freq: Once | INTRAVENOUS | Status: AC
  Administered 2015-10-27: 1000 mL via INTRAVENOUS

## 2015-10-27 NOTE — ED Notes (Signed)
MD at bedside. 

## 2015-10-27 NOTE — ED Notes (Signed)
Patient transported to CT 

## 2015-10-27 NOTE — ED Provider Notes (Signed)
CSN: [id number]     Arrival date & time 10/27/15  0913 History   First MD Initiated Contact with Patient 10/27/15 [phone]     Chief Complaint  Patient presents with  . Flank Pain     HPI Patient presents to the emergency room with complaints of left flank and abdominal pain. Patient's symptoms started about a week or 2 ago. She was having some urinary discomfort and pain in the flank area that she felt was similar to kidney stones. She saw her primary doctor and was referred to her urologist. She had plain film x-rays and her urologist told her he thought he saw a kidney stone. He recommended pain medications and conservative management. This morning however, the pain acutely intensified.  The pain is sharp. It does not radiate. She denies any fevers. She denies any diarrhea. She denies any vomiting although she has felt nauseated. Past Medical History  Diagnosis Date  . GERD (gastroesophageal reflux disease)   . Chronic lower back pain   . Paroxysmal atrial fibrillation (HCC)   . Migraines   . Spinal stenosis   . SUI (stress urinary incontinence, female)   . History of kidney stones   . TMJ (temporomandibular joint syndrome)     LEFT SIDE--  WEAR  MOUTH GUARD  . H/O hiatal hernia   . Plantar fasciitis   . Typical atrial flutter ([place])   . 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 Kidney stone    Past Surgical History  Procedure Laterality Date  . Anterior cervical decomp/discectomy fusion  04-08-2010    C4 -- C5  . Posterior fusion lumbar spine  09-16-2009    L4 -- L5  . Appendectomy  1984  . Transthoracic echocardiogram  04-03-2012    normal LVF/  ef 55-60%  . Cardiovascular stress test  04-27-2014   dr berry    normal perfusion study/  no ischemia/  ef 61%  . Lasik  1990's  . Bilateral thunb joint arthrodesis  left 2007/   right 2010  . Negative sleep study  04-15-2013  . Cystoscopy N/A 08/04/2014    Procedure: CYSTOSCOPY;  Surgeon: Ardis Hughs, MD;  Location: [facility];  Service: Urology;  Laterality: N/A;  . Pubovaginal sling N/A 08/04/2014    Procedure: BOSTON SCIENTIFIC RETRO-PUBIC MID URETHRAL SLING;  Surgeon: Ardis Hughs, MD;  Location: [facility];  Service: Urology;  Laterality: N/A;  . Tee without cardioversion N/A 02/06/2015    Procedure: TRANSESOPHAGEAL ECHOCARDIOGRAM (TEE);  Surgeon: Lelon Perla, MD;  Location: [facility];  Service: Cardiovascular;  Laterality: N/A;  . Tee without cardioversion N/A 05/14/2015    Procedure: TRANSESOPHAGEAL ECHOCARDIOGRAM (TEE);  Surgeon: Sanda Klein, MD;  Location: [facility] ENDOSCOPY;  Service: Cardiovascular;  Laterality: N/A;  . Tee without cardioversion N/A 09/02/2015    Procedure: TRANSESOPHAGEAL ECHOCARDIOGRAM (TEE);  Surgeon: Sueanne Margarita, MD;  Location: [facility] ENDOSCOPY;  Service: Cardiovascular;  Laterality: N/A;  . Tee without cardioversion N/A 09/09/2015    Procedure: TRANSESOPHAGEAL ECHOCARDIOGRAM (TEE);  Surgeon: Lelon Perla, MD;  Location: [facility] ENDOSCOPY;  Service: Cardiovascular;  Laterality: N/A;   Family History  Problem Relation Age of Onset  . Hypertension    . Heart attack Father 46  . Heart disease Mother   . CAD Father 18  . Arrhythmia Father    Social History  Substance Use Topics  . Smoking status: Former Smoker -- 0.50 packs/day for 20 years    Types:  Cigarettes    Quit date: 11/07/2001  . Smokeless tobacco: Never Used  . Alcohol Use: No   OB History    No data available     Review of Systems  All other systems reviewed and are negative.     Allergies  Review of patient's allergies indicates no known allergies.  Home Medications   Prior to Admission medications   Medication Sig Start Date End Date Taking? Authorizing Provider  amiodarone (PACERONE) 200 MG tablet Take 1 tablet (200 mg total) by mouth daily. 10/19/15  Yes Rhonda G Barrett, PA-C  apixaban (ELIQUIS) 5 MG TABS tablet Take 1 tablet (5 mg total) by mouth 2 (two) times  daily. 10/19/15  Yes Rhonda G Barrett, PA-C  desvenlafaxine (PRISTIQ) 50 MG 24 hr tablet Take 50 mg by mouth every other day.    Yes Historical Provider, MD  LORazepam (ATIVAN) 0.5 MG tablet Take 0.5 mg by mouth at bedtime.    Yes Historical Provider, MD  metoprolol tartrate (LOPRESSOR) 25 MG tablet Take 1 tablet (25 mg total) by mouth 2 (two) times daily. 10/19/15  Yes Rhonda G Barrett, PA-C  Multiple Vitamin (MULITIVITAMIN WITH MINERALS) TABS Take 1 tablet by mouth daily.   Yes Historical Provider, MD  NEXIUM 40 MG capsule Take 40 mg by mouth daily as needed (for heartburn).  06/25/12  Yes Historical Provider, MD  rizatriptan (MAXALT-MLT) 10 MG disintegrating tablet Take 10 mg by mouth daily as needed for migraine.  02/26/13  Yes Historical Provider, MD  zolpidem (AMBIEN) 5 MG tablet Take 5 mg by mouth at bedtime as needed for sleep.  02/12/15 02/12/16 Yes Historical Provider, MD  cephALEXin (KEFLEX) 500 MG capsule Take 1 capsule (500 mg total) by mouth 4 (four) times daily. 10/27/15   Dorie Rank, MD  cyclobenzaprine (FLEXERIL) 10 MG tablet Take 1 tablet (10 mg total) by mouth 2 (two) times daily as needed for muscle spasms. 07/10/15   Debby Freiberg, MD  furosemide (LASIX) 40 MG tablet Take one tablet by mouth daily as needed for swelling Patient taking differently: Take 40 mg by mouth daily as needed (for swelling).  03/17/15   Evans Lance, MD  ondansetron (ZOFRAN ODT) 8 MG disintegrating tablet Take 1 tablet (8 mg total) by mouth every 8 (eight) hours as needed for nausea or vomiting. 10/27/15   Dorie Rank, MD  oxyCODONE (ROXICODONE) 15 MG immediate release tablet Take 0.5-1 tablets (7.5-15 mg total) by mouth every 4 (four) hours as needed for pain. For pain 09/02/15   Sueanne Margarita, MD  oxyCODONE-acetaminophen (PERCOCET/ROXICET) 5-325 MG tablet Take 1 tablet by mouth every 4 (four) hours as needed. 10/27/15   Dorie Rank, MD  potassium chloride (K-DUR) 10 MEQ tablet Take one tablet by mouth daily when  taking the fluid pill for swelling Patient taking differently: Take 10 mEq by mouth daily as needed (with lasix).  03/17/15   Evans Lance, MD   BP 101/62 mmHg  Pulse 51  Temp(Src) 98.2 F (36.8 C) (Oral)  Resp 18  Ht 5\' 7"  (1.702 m)  Wt 81.647 kg  BMI 28.19 kg/m2  SpO2 100%  LMP 12/25/2012 Physical Exam  Constitutional: She appears well-developed and well-nourished. No distress.  HENT:  Head: Normocephalic and atraumatic.  Right Ear: External ear normal.  Left Ear: External ear normal.  Eyes: Conjunctivae are normal. Right eye exhibits no discharge. Left eye exhibits no discharge. No scleral icterus.  Neck: Neck supple. No tracheal deviation present.  Cardiovascular:  Normal rate, regular rhythm and intact distal pulses.   Pulmonary/Chest: Effort normal and breath sounds normal. No stridor. No respiratory distress. She has no wheezes. She has no rales.  Abdominal: Soft. Bowel sounds are normal. She exhibits no distension. There is tenderness in the left lower quadrant. There is no rebound and no guarding.  Musculoskeletal: She exhibits no edema or tenderness.  Neurological: She is alert. She has normal strength. No cranial nerve deficit (no facial droop, extraocular movements intact, no slurred speech) or sensory deficit. She exhibits normal muscle tone. She displays no seizure activity. Coordination normal.  Skin: Skin is warm and dry. No rash noted.  Psychiatric: She has a normal mood and affect.  Nursing note and vitals reviewed.   ED Course  Procedures (including critical care time) Labs Review Labs Reviewed  URINALYSIS, ROUTINE W REFLEX MICROSCOPIC (NOT AT [facility]) - Abnormal; Notable for the following:    Color, Urine AMBER (*)    APPearance CLOUDY (*)    Hgb urine dipstick MODERATE (*)    Bilirubin Urine SMALL (*)    Ketones, ur 15 (*)    Leukocytes, UA MODERATE (*)    All other components within normal limits  COMPREHENSIVE METABOLIC PANEL - Abnormal; Notable for the  following:    Glucose, Bld 111 (*)    ALT 13 (*)    All other components within normal limits  URINE MICROSCOPIC-ADD ON - Abnormal; Notable for the following:    Squamous Epithelial / LPF 6-30 (*)    Bacteria, UA MANY (*)    Crystals CA OXALATE CRYSTALS (*)    All other components within normal limits  URINE CULTURE  CBC WITH DIFFERENTIAL/PLATELET  LIPASE, BLOOD    Imaging Review Ct Abdomen Pelvis Wo Contrast  10/27/2015  CLINICAL DATA:  Left flank pain for 2 days.  History kidney stones. EXAM: CT ABDOMEN AND PELVIS WITHOUT CONTRAST TECHNIQUE: Multidetector CT imaging of the abdomen and pelvis was performed following the standard protocol without IV contrast. COMPARISON:  02/22/2013 FINDINGS: Lower chest:  Normal. Hepatobiliary: The liver parenchyma is normal. There is layering sludge or numerous tiny stones in the gallbladder. No bile duct dilatation. Pancreas: Normal. Spleen: Normal. Adrenals/Urinary Tract: Normal adrenal glands. 5.2 cm cyst on the upper pole of the right kidney. 4.6 cm cyst on the lower pole. 2 mm stone in the lower pole of the right kidney. Left kidney is normal. No ureteral or bladder calculi. Stomach/Bowel: Normal. Stool throughout the colon. Appendix has been removed. Vascular/Lymphatic: No adenopathy. Calcification in the distal abdominal aorta and common iliac arteries. Reproductive: Normal. Other: No free air or free fluid. Musculoskeletal: No acute abnormality.  Prior fusion at L4-5. IMPRESSION: No acute abnormalities. There is either extensive sludge or numerous tiny stones in the gallbladder. Aortic atherosclerosis. Electronically Signed   By: Lorriane Shire M.D.   On: 10/27/2015 10:40    MDM   Final diagnoses:  Pyelonephritis    Patient's laboratory and CT scan results were reviewed.  Urinalysis suggests possibility of urinary tract infection. CT scan does not show any acute abnormality to account for her pain. Gallstones are incidental. Not have any evidence  of a kidney stone on the left side.  I suspect her symptoms may be related to urinary tract infection and early pyelonephritis considering her flank pain. She did take a course of Macrobid after not tolerating Bactrim initially. I will start her on a course of Keflex.  Sendoff and urine culture. Have her follow-up with her  primary doctor if the symptoms persist to consider further evaluation.       Dorie Rank, MD 10/27/15 [phone]

## 2015-10-27 NOTE — ED Notes (Signed)
Flank pain x 2 weeks- ?kidney stone

## 2015-10-27 NOTE — Discharge Instructions (Signed)

## 2015-10-28 ENCOUNTER — Inpatient Hospital Stay: Admission: RE | Admit: 2015-10-28 | Payer: BLUE CROSS/BLUE SHIELD | Source: Ambulatory Visit

## 2015-10-28 ENCOUNTER — Other Ambulatory Visit: Payer: BLUE CROSS/BLUE SHIELD

## 2015-10-28 LAB — URINE CULTURE

## 2015-10-29 ENCOUNTER — Ambulatory Visit (INDEPENDENT_AMBULATORY_CARE_PROVIDER_SITE_OTHER): Payer: BLUE CROSS/BLUE SHIELD

## 2015-10-29 DIAGNOSIS — M722 Plantar fascial fibromatosis: Secondary | ICD-10-CM | POA: Diagnosis not present

## 2015-10-29 DIAGNOSIS — Z9889 Other specified postprocedural states: Secondary | ICD-10-CM

## 2015-10-29 NOTE — Progress Notes (Signed)
Patient ID: Miranda Taylor, female   DOB: 1961/02/22, 54 y.o.   MRN: [id number] HPI patient states I'm doing very well with my right foot with minimal swelling or pain   Review of Systems     Objective:   Physical Exam Neurovascular status intact negative Homans sign noted with wound edges well coapted medial lateral side heel right with stitches in place and no drainage. Stitches were removed and wound edges remained approximated    Assessment:     Doing well post endoscopic surgery    Plan:     Advised this patient on continued immobilization elevation and gradual increase in activity level. Reapplied sterile dressing and discussed treatment and assessment with Dr Milinda Pointer, re-appointed to follow up with Dr Paulla Dolly in 2 weeks or sooner if problems arise

## 2015-11-06 LAB — CBC WITH DIFFERENTIAL/PLATELET
BASOS PCT: 1 % (ref 0–1)
Basophils Absolute: 0.1 10*3/uL (ref 0.0–0.1)
EOS ABS: 0.1 10*3/uL (ref 0.0–0.7)
Eosinophils Relative: 2 % (ref 0–5)
HCT: 35.9 % — ABNORMAL LOW (ref 36.0–46.0)
HEMOGLOBIN: 11.6 g/dL — AB (ref 12.0–15.0)
Lymphocytes Relative: 50 % — ABNORMAL HIGH (ref 12–46)
Lymphs Abs: 3.3 10*3/uL (ref 0.7–4.0)
MCH: 27.5 pg (ref 26.0–34.0)
MCHC: 32.3 g/dL (ref 30.0–36.0)
MCV: 85.1 fL (ref 78.0–100.0)
MPV: 10.6 fL (ref 8.6–12.4)
Monocytes Absolute: 0.5 10*3/uL (ref 0.1–1.0)
Monocytes Relative: 7 % (ref 3–12)
NEUTROS ABS: 2.6 10*3/uL (ref 1.7–7.7)
NEUTROS PCT: 40 % — AB (ref 43–77)
PLATELETS: 257 10*3/uL (ref 150–400)
RBC: 4.22 MIL/uL (ref 3.87–5.11)
RDW: 14.7 % (ref 11.5–15.5)
WBC: 6.5 10*3/uL (ref 4.0–10.5)

## 2015-11-06 LAB — PROTIME-INR
INR: 1.16 (ref <1.50)
PROTHROMBIN TIME: 14.9 s (ref 11.6–15.2)

## 2015-11-06 LAB — APTT: aPTT: 31 seconds (ref 24–37)

## 2015-11-07 LAB — BASIC METABOLIC PANEL
BUN: 12 mg/dL (ref 7–25)
CALCIUM: 8.8 mg/dL (ref 8.6–10.4)
CO2: 28 mmol/L (ref 20–31)
Chloride: 106 mmol/L (ref 98–110)
Creat: 0.78 mg/dL (ref 0.50–1.05)
Glucose, Bld: 72 mg/dL (ref 65–99)
Potassium: 3.6 mmol/L (ref 3.5–5.3)
SODIUM: 143 mmol/L (ref 135–146)

## 2015-11-11 ENCOUNTER — Ambulatory Visit
Admission: RE | Admit: 2015-11-11 | Discharge: 2015-11-11 | Disposition: A | Discharge status: Home / Self Care | Payer: BLUE CROSS/BLUE SHIELD | Source: Ambulatory Visit | Attending: Thoracic Surgery (Cardiothoracic Vascular Surgery) | Admitting: Thoracic Surgery (Cardiothoracic Vascular Surgery)

## 2015-11-11 DIAGNOSIS — I719 Aortic aneurysm of unspecified site, without rupture: Secondary | ICD-10-CM

## 2015-11-11 DIAGNOSIS — I7409 Other arterial embolism and thrombosis of abdominal aorta: Secondary | ICD-10-CM

## 2015-11-11 MED ORDER — IOPAMIDOL (ISOVUE-370) INJECTION 76%
75.0000 mL | Freq: Once | INTRAVENOUS | Status: AC | PRN
Start: 2015-11-11 — End: 2015-11-11
  Administered 2015-11-11: 75 mL via INTRAVENOUS

## 2015-11-12 ENCOUNTER — Encounter (HOSPITAL_COMMUNITY)
Admission: RE | Disposition: A | Discharge status: Home / Self Care | Payer: Self-pay | Source: Ambulatory Visit | Attending: Cardiovascular Disease

## 2015-11-12 ENCOUNTER — Ambulatory Visit (HOSPITAL_COMMUNITY)
Admission: RE | Admit: 2015-11-12 | Discharge: 2015-11-12 | Disposition: A | Discharge status: Home / Self Care | Payer: BLUE CROSS/BLUE SHIELD | Source: Ambulatory Visit | Attending: Cardiovascular Disease | Admitting: Cardiovascular Disease

## 2015-11-12 DIAGNOSIS — K219 Gastro-esophageal reflux disease without esophagitis: Secondary | ICD-10-CM | POA: Insufficient documentation

## 2015-11-12 DIAGNOSIS — M722 Plantar fascial fibromatosis: Secondary | ICD-10-CM | POA: Diagnosis not present

## 2015-11-12 DIAGNOSIS — Z87442 Personal history of urinary calculi: Secondary | ICD-10-CM | POA: Insufficient documentation

## 2015-11-12 DIAGNOSIS — M545 Low back pain: Secondary | ICD-10-CM | POA: Diagnosis not present

## 2015-11-12 DIAGNOSIS — Z87891 Personal history of nicotine dependence: Secondary | ICD-10-CM | POA: Diagnosis not present

## 2015-11-12 DIAGNOSIS — R222 Localized swelling, mass and lump, trunk: Secondary | ICD-10-CM

## 2015-11-12 DIAGNOSIS — I480 Paroxysmal atrial fibrillation: Secondary | ICD-10-CM | POA: Insufficient documentation

## 2015-11-12 DIAGNOSIS — Z7901 Long term (current) use of anticoagulants: Secondary | ICD-10-CM | POA: Insufficient documentation

## 2015-11-12 DIAGNOSIS — R0600 Dyspnea, unspecified: Secondary | ICD-10-CM | POA: Diagnosis not present

## 2015-11-12 DIAGNOSIS — Z01810 Encounter for preprocedural cardiovascular examination: Secondary | ICD-10-CM | POA: Diagnosis present

## 2015-11-12 DIAGNOSIS — G8929 Other chronic pain: Secondary | ICD-10-CM | POA: Insufficient documentation

## 2015-11-12 DIAGNOSIS — Z8249 Family history of ischemic heart disease and other diseases of the circulatory system: Secondary | ICD-10-CM | POA: Insufficient documentation

## 2015-11-12 DIAGNOSIS — I5189 Other ill-defined heart diseases: Secondary | ICD-10-CM | POA: Diagnosis present

## 2015-11-12 DIAGNOSIS — I483 Typical atrial flutter: Secondary | ICD-10-CM | POA: Diagnosis not present

## 2015-11-12 DIAGNOSIS — G43909 Migraine, unspecified, not intractable, without status migrainosus: Secondary | ICD-10-CM | POA: Diagnosis not present

## 2015-11-12 DIAGNOSIS — N393 Stress incontinence (female) (male): Secondary | ICD-10-CM | POA: Diagnosis not present

## 2015-11-12 SURGERY — CORONARY ANGIOGRAM

## 2015-11-12 MED ORDER — ASPIRIN 81 MG PO CHEW
81.0000 mg | CHEWABLE_TABLET | ORAL | Status: AC
  Administered 2015-11-12: 81 mg via ORAL

## 2015-11-12 MED ORDER — MORPHINE SULFATE (PF) 2 MG/ML IV SOLN: 2.0000 mg | INTRAVENOUS | Status: DC | PRN

## 2015-11-12 MED ORDER — SODIUM CHLORIDE 0.9 % IJ SOLN: 3.0000 mL | Freq: Two times a day (BID) | INTRAMUSCULAR | Status: DC

## 2015-11-12 MED ORDER — SODIUM CHLORIDE 0.9 % WEIGHT BASED INFUSION: 3.0000 mL/kg/h | INTRAVENOUS | Status: DC

## 2015-11-12 MED ORDER — MIDAZOLAM HCL 2 MG/2ML IJ SOLN
INTRAMUSCULAR | Status: AC
  Filled 2015-11-12: qty 2

## 2015-11-12 MED ORDER — IOHEXOL 350 MG/ML SOLN
INTRAVENOUS | Status: DC | PRN
  Administered 2015-11-12: 30 mL via INTRAVENOUS

## 2015-11-12 MED ORDER — ONDANSETRON HCL 4 MG/2ML IJ SOLN: 4.0000 mg | Freq: Four times a day (QID) | INTRAMUSCULAR | Status: DC | PRN

## 2015-11-12 MED ORDER — FENTANYL CITRATE (PF) 100 MCG/2ML IJ SOLN
INTRAMUSCULAR | Status: DC | PRN
  Administered 2015-11-12 (×2): 25 ug via INTRAVENOUS

## 2015-11-12 MED ORDER — NITROGLYCERIN 1 MG/10 ML FOR IR/CATH LAB
INTRA_ARTERIAL | Status: AC
  Filled 2015-11-12: qty 10

## 2015-11-12 MED ORDER — SODIUM CHLORIDE 0.9 % WEIGHT BASED INFUSION: 1.0000 mL/kg/h | INTRAVENOUS | Status: DC

## 2015-11-12 MED ORDER — LIDOCAINE HCL (PF) 1 % IJ SOLN
INTRAMUSCULAR | Status: AC
  Filled 2015-11-12: qty 30

## 2015-11-12 MED ORDER — FENTANYL CITRATE (PF) 100 MCG/2ML IJ SOLN
INTRAMUSCULAR | Status: AC
  Filled 2015-11-12: qty 2

## 2015-11-12 MED ORDER — HEPARIN SODIUM (PORCINE) 1000 UNIT/ML IJ SOLN
INTRAMUSCULAR | Status: AC
  Filled 2015-11-12: qty 1

## 2015-11-12 MED ORDER — SODIUM CHLORIDE 0.9 % IV SOLN: 250.0000 mL | INTRAVENOUS | Status: DC | PRN

## 2015-11-12 MED ORDER — ASPIRIN 81 MG PO CHEW: 81.0000 mg | CHEWABLE_TABLET | Freq: Every day | ORAL | Status: DC

## 2015-11-12 MED ORDER — SODIUM CHLORIDE 0.9 % IJ SOLN: 3.0000 mL | INTRAMUSCULAR | Status: DC | PRN

## 2015-11-12 MED ORDER — SODIUM CHLORIDE 0.9 % WEIGHT BASED INFUSION
3.0000 mL/kg/h | INTRAVENOUS | Status: AC
  Administered 2015-11-12: 3 mL/kg/h via INTRAVENOUS

## 2015-11-12 MED ORDER — HEPARIN SODIUM (PORCINE) 1000 UNIT/ML IJ SOLN
INTRAMUSCULAR | Status: DC | PRN
  Administered 2015-11-12: 4000 [IU] via INTRAVENOUS

## 2015-11-12 MED ORDER — HEPARIN (PORCINE) IN NACL 2-0.9 UNIT/ML-% IJ SOLN
INTRAMUSCULAR | Status: AC
  Filled 2015-11-12: qty 1000

## 2015-11-12 MED ORDER — MIDAZOLAM HCL 2 MG/2ML IJ SOLN
INTRAMUSCULAR | Status: DC | PRN
  Administered 2015-11-12 (×2): 1 mg via INTRAVENOUS

## 2015-11-12 MED ORDER — NITROGLYCERIN 1 MG/10 ML FOR IR/CATH LAB
INTRA_ARTERIAL | Status: DC | PRN
  Administered 2015-11-12: 5 mL

## 2015-11-12 MED ORDER — VERAPAMIL HCL 2.5 MG/ML IV SOLN
INTRAVENOUS | Status: AC
  Filled 2015-11-12: qty 2

## 2015-11-12 MED ORDER — ACETAMINOPHEN 325 MG PO TABS: 650.0000 mg | ORAL_TABLET | ORAL | Status: DC | PRN

## 2015-11-12 MED ORDER — VERAPAMIL HCL 2.5 MG/ML IV SOLN
INTRA_ARTERIAL | Status: DC | PRN
  Administered 2015-11-12: 12:00:00 via INTRA_ARTERIAL

## 2015-11-12 MED ORDER — ASPIRIN 81 MG PO CHEW
CHEWABLE_TABLET | ORAL | Status: AC
  Administered 2015-11-12: 81 mg via ORAL
  Filled 2015-11-12: qty 1

## 2015-11-12 SURGICAL SUPPLY — 9 items
CATH OPTITORQUE TIG 4.0 5F (CATHETERS) ×3 IMPLANT
DEVICE RAD COMP TR BAND LRG (VASCULAR PRODUCTS) ×3 IMPLANT
GLIDESHEATH SLEND A-KIT 6F 22G (SHEATH) ×3 IMPLANT
KIT HEART LEFT (KITS) ×3 IMPLANT
PACK CARDIAC CATHETERIZATION (CUSTOM PROCEDURE TRAY) ×3 IMPLANT
TRANSDUCER W/STOPCOCK (MISCELLANEOUS) ×3 IMPLANT
TUBING CIL FLEX 10 FLL-RA (TUBING) ×3 IMPLANT
WIRE HI TORQ VERSACORE-J 145CM (WIRE) ×3 IMPLANT
WIRE SAFE-T 1.5MM-J .035X260CM (WIRE) ×3 IMPLANT

## 2015-11-12 NOTE — Interval H&P Note (Signed)
Cath Lab Visit (complete for each Cath Lab visit)  Clinical Evaluation Leading to the Procedure:   ACS: No.  Non-ACS:    Anginal Classification: No Symptoms  Anti-ischemic medical therapy: No Therapy  Non-Invasive Test Results: No non-invasive testing performed  Prior CABG: No previous CABG      History and Physical Interval Note:  11/12/2015 11:53 AM  Miranda Taylor  has presented today for surgery, with the diagnosis of pre-surgical clearance  The various methods of treatment have been discussed with the patient and family. After consideration of risks, benefits and other options for treatment, the patient has consented to  Procedure(s): Left Heart Cath and Coronary Angiography (N/A) as a surgical intervention .  The patient's history has been reviewed, patient examined, no change in status, stable for surgery.  I have reviewed the patient's chart and labs.  Questions were answered to the patient's satisfaction.     Quay Burow

## 2015-11-12 NOTE — Discharge Instructions (Signed)
Radial Site Care °Refer to this sheet in the next few weeks. These instructions provide you with information about caring for yourself after your procedure. Your health care provider may also give you more specific instructions. Your treatment has been planned according to current medical practices, but problems sometimes occur. Call your health care provider if you have any problems or questions after your procedure. °WHAT TO EXPECT AFTER THE PROCEDURE °After your procedure, it is typical to have the following: °· Bruising at the radial site that usually fades within 1-2 weeks. °· Blood collecting in the tissue (hematoma) that may be painful to the touch. It should usually decrease in size and tenderness within 1-2 weeks. °HOME CARE INSTRUCTIONS °· Take medicines only as directed by your health care provider. °· You may shower 24-48 hours after the procedure or as directed by your health care provider. Remove the bandage (dressing) and gently wash the site with plain soap and water. Pat the area dry with a clean towel. Do not rub the site, because this may cause bleeding. °· Do not take baths, swim, or use a hot tub until your health care provider approves. °· Check your insertion site every day for redness, swelling, or drainage. °· Do not apply powder or lotion to the site. °· Do not flex or bend the affected arm for 24 hours or as directed by your health care provider. °· Do not push or pull heavy objects with the affected arm for 24 hours or as directed by your health care provider. °· Do not lift over 10 lb (4.5 kg) for 5 days after your procedure or as directed by your health care provider. °· Ask your health care provider when it is okay to: °¨ Return to work or school. °¨ Resume usual physical activities or sports. °¨ Resume sexual activity. °· Do not drive home if you are discharged the same day as the procedure. Have someone else drive you. °· You may drive 24 hours after the procedure unless otherwise  instructed by your health care provider. °· Do not operate machinery or power tools for 24 hours after the procedure. °· If your procedure was done as an outpatient procedure, which means that you went home the same day as your procedure, a responsible adult should be with you for the first 24 hours after you arrive home. °· Keep all follow-up visits as directed by your health care provider. This is important. °SEEK MEDICAL CARE IF: °· You have a fever. °· You have chills. °· You have increased bleeding from the radial site. Hold pressure on the site. °SEEK IMMEDIATE MEDICAL CARE IF: °· You have unusual pain at the radial site. °· You have redness, warmth, or swelling at the radial site. °· You have drainage (other than a small amount of blood on the dressing) from the radial site. °· The radial site is bleeding, and the bleeding does not stop after 30 minutes of holding steady pressure on the site. °· Your arm or hand becomes pale, cool, tingly, or numb. °  °This information is not intended to replace advice given to you by your health care provider. Make sure you discuss any questions you have with your health care provider. °  °Document Released: 11/26/2010 Document Revised: 11/14/2014 Document Reviewed: 05/12/2014 °[employer]vier Interactive Patient Education ©2016 [employer] ° °

## 2015-11-12 NOTE — H&P (View-Only) (Signed)
Cardiology Office Note   Date:  10/19/2015   ID:  Miranda Taylor, Miranda Taylor 07-01-1961, MRN [id number]  PCP:  Tereasa Coop, PA-C  Cardiologist:  Dr Gwenlyn Found, Dr Madolyn Frieze, PA-C   Chief Complaint  Patient presents with  . Follow-up    pt states no Sx//would like to reschedule CATH for after the first of the year--supposed to be 12/15    History of Present Illness: Miranda Taylor is a 55 y.o. female with a history of PAF, flutter, small mass LAA, surgical resection planned. Needs cath first.  Miranda Taylor presents for preoperative evaluation.  She never gets chest pain. However, she has chronic dyspnea on exertion. She is extremely busy, doing things with 2 foster children and her grandchildren. However, she does not exercise regularly. She notices that while her energy level is good and she doesn't mind the days being busy, she does have some dyspnea on exertion. She is not sure how far she can walk without stopping. She admits that she does not know how far she can walk without stopping. She has not had lower extremity edema, orthopnea or PND. She has not had palpitations and doesn't think she has had any recent atrial fibrillation.  Because of scheduling issues around the holidays, she wishes to delay the heart catheterization until the first year. She sees the surgeon again on January 9. She is compliant with her amiodarone, metoprolol and her Eliquis. She is aware that she has bradycardia and says it is from the metoprolol, but does not have any symptoms she attributes to this.         Past Medical History  Diagnosis Date  . GERD (gastroesophageal reflux disease)   . Chronic lower back pain   . Paroxysmal atrial fibrillation (HCC)   . Migraines   . Spinal stenosis   . SUI (stress urinary incontinence, female)   . History of kidney stones   . TMJ (temporomandibular joint syndrome)     LEFT SIDE--  WEAR  MOUTH GUARD  . H/O hiatal hernia   . Plantar fasciitis   .  Typical atrial flutter ([place])   . 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 Anterior cervical decomp/discectomy fusion  04-08-2010    C4 -- C5  . Posterior fusion lumbar spine  09-16-2009    L4 -- L5  . Appendectomy  1984  . Transthoracic echocardiogram  04-03-2012    normal LVF/  ef 55-60%  . Cardiovascular stress test  04-27-2014   dr berry    normal perfusion study/  no ischemia/  ef 61%  . Lasik  1990's  . Bilateral thunb joint arthrodesis  left 2007/   right 2010  . Negative sleep study  04-15-2013  . Cystoscopy N/A 08/04/2014    Procedure: CYSTOSCOPY;  Surgeon: Ardis Hughs, MD;  Location: [facility];  Service: Urology;  Laterality: N/A;  . Pubovaginal sling N/A 08/04/2014    Procedure: BOSTON SCIENTIFIC RETRO-PUBIC MID URETHRAL SLING;  Surgeon: Ardis Hughs, MD;  Location: [facility];  Service: Urology;  Laterality: N/A;  . Tee without cardioversion N/A 02/06/2015    Procedure: TRANSESOPHAGEAL ECHOCARDIOGRAM (TEE);  Surgeon: Lelon Perla, MD;  Location: [facility] ENDOSCOPY;  Service: Cardiovascular;  Laterality: N/A;  . Tee without cardioversion N/A 05/14/2015    Procedure: TRANSESOPHAGEAL ECHOCARDIOGRAM (TEE);  Surgeon: Sanda Klein, MD;  Location: [facility];  Service:  Cardiovascular;  Laterality: N/A;  . Tee without cardioversion N/A 09/02/2015    Procedure: TRANSESOPHAGEAL ECHOCARDIOGRAM (TEE);  Surgeon: Sueanne Margarita, MD;  Location: [facility] ENDOSCOPY;  Service: Cardiovascular;  Laterality: N/A;  . Tee without cardioversion N/A 09/09/2015    Procedure: TRANSESOPHAGEAL ECHOCARDIOGRAM (TEE);  Surgeon: Lelon Perla, MD;  Location: [facility] ENDOSCOPY;  Service: Cardiovascular;  Laterality: N/A;    Current Outpatient Prescriptions  Medication Sig Dispense Refill  . amiodarone (PACERONE) 200 MG tablet Take 1 tablet (200 mg total) by mouth daily. 30 tablet 6  . apixaban (ELIQUIS) 5 MG TABS tablet  Take 1 tablet (5 mg total) by mouth 2 (two) times daily. 60 tablet 6  . cyclobenzaprine (FLEXERIL) 10 MG tablet Take 1 tablet (10 mg total) by mouth 2 (two) times daily as needed for muscle spasms. 20 tablet 0  . desvenlafaxine (PRISTIQ) 50 MG 24 hr tablet Take 50 mg by mouth every other day.     . furosemide (LASIX) 40 MG tablet Take one tablet by mouth daily as needed for swelling (Patient taking differently: Take 40 mg by mouth daily as needed (for swelling). ) 30 tablet 3  . LORazepam (ATIVAN) 0.5 MG tablet Take 0.5 mg by mouth at bedtime.     . metoprolol tartrate (LOPRESSOR) 25 MG tablet Take 1 tablet (25 mg total) by mouth 2 (two) times daily. 90 tablet 3  . Multiple Vitamin (MULITIVITAMIN WITH MINERALS) TABS Take 1 tablet by mouth daily.    Marland Kitchen NEXIUM 40 MG capsule Take 40 mg by mouth daily as needed (for heartburn).     Marland Kitchen oxyCODONE (ROXICODONE) 15 MG immediate release tablet Take 0.5-1 tablets (7.5-15 mg total) by mouth every 4 (four) hours as needed for pain. For pain 30 tablet 0  . oxyCODONE-acetaminophen (PERCOCET/ROXICET) 5-325 MG per tablet Take 1-2 tablets by mouth every 4 (four) hours as needed. 15 tablet 0  . potassium chloride (K-DUR) 10 MEQ tablet Take one tablet by mouth daily when taking the fluid pill for swelling (Patient taking differently: Take 10 mEq by mouth daily as needed (with lasix). ) 30 tablet 3  . rizatriptan (MAXALT-MLT) 10 MG disintegrating tablet Take 10 mg by mouth daily as needed for migraine.     Marland Kitchen zolpidem (AMBIEN) 5 MG tablet Take 5 mg by mouth at bedtime as needed for sleep.      No current facility-administered medications for this visit.    Allergies:   Review of patient's allergies indicates no known allergies.    Social History:  The patient  reports that she quit smoking about 13 years ago. Her smoking use included Cigarettes. She has a 10 pack-year smoking history. She has never used smokeless tobacco. She reports that she does not drink alcohol or  use illicit drugs.   Family History:  The patient's family history includes Arrhythmia in her father; CAD (age of onset: 41) in her father; Heart attack (age of onset: 74) in her father; Heart disease in her mother; Hypertension in an other family member.    ROS:  Please see the history of present illness. All other systems are reviewed and negative.    PHYSICAL EXAM: VS:  BP 118/76 mmHg  Pulse 53  Ht 5\' 7"  (1.702 m)  Wt 185 lb 9.6 oz (84.188 kg)  BMI 29.06 kg/m2  LMP 12/25/2012 , BMI Body mass index is 29.06 kg/(m^2). GEN: Well nourished, well developed, female in no acute distress HEENT: normal for age  Neck: no JVD, no  carotid bruit, no masses Cardiac: RRR; soft murmur, no rubs, or gallops Respiratory:  clear to auscultation bilaterally, normal work of breathing GI: soft, nontender, nondistended, + BS MS: no deformity or atrophy; no edema; distal pulses are 2+ in all 4 extremities  Skin: warm and dry, no rash Neuro:  Strength and sensation are intact Psych: euthymic mood, full affect   EKG:  EKG is ordered today. The ekg ordered today demonstrates sinus bradycardia, rate 53, no acute ischemic changes   Recent Labs: 02/03/2015: B Natriuretic Peptide 366.7*; TSH 3.081 02/04/2015: ALT 51* 02/05/2015: Magnesium 2.1 09/03/2015: BUN 16; Creatinine, Ser 0.98; Hemoglobin 12.2; Platelets 205; Potassium 4.2; Sodium 141    Lipid Panel    Component Value Date/Time   CHOL 180 10/15/2014 0939   TRIG 58 10/15/2014 0939   HDL 62 10/15/2014 0939   CHOLHDL 2.9 10/15/2014 0939   VLDL 12 10/15/2014 0939   LDLCALC 106* 10/15/2014 0939     Wt Readings from Last 3 Encounters:  10/19/15 185 lb 9.6 oz (84.188 kg)  10/12/15 180 lb (81.647 kg)  08/26/15 190 lb 12.8 oz (86.546 kg)     Other studies Reviewed: Additional studies/ records that were reviewed today include: Previous office notes, [facility] consult note.  ASSESSMENT AND PLAN:  1.  Dyspnea on exertion: She has no volume overload  by exam. She has no history of heart failure or left ventricular dysfunction. With regard to her upcoming surgery, we will evaluate the dyspnea on exertion with a cardiac catheterization. As her symptoms have not changed acutely, we can defer this until after the first of the year. As she has no chest pain, we will not add aspirin. She is starting on a beta blocker.    2. Paroxysmal atrial fibrillation: When in sinus rhythm, she is bradycardic because of the metoprolol and amiodarone. She has not had recent palpitations, so we will continue the medications current dosing.  3. Chronic anticoagulation: She is compliant with her Eliquis taking it twice daily. Her CHADS2VASC=1 (female).  4. Left atrial mass: At first this was thought to be thrombus, but did not resolve after 3 months of taking Eliquis twice a day. She has been seen by Dr. Roxy Manns and surgical removal is planned, minimally invasive. She sees him again in January.  Current medicines are reviewed at length with the patient today.  The patient does not have concerns regarding medicines.  The following changes have been made:  no change  Labs/ tests ordered today include: Cardiac cath rescheduled    Disposition:   FU withDr. Gwenlyn Found and Dr. Lovena Le as scheduled  Signed, Lenoard Aden  10/19/2015 9:41 AM    [address], [place], [place]  [address] Phone: [phone]; Fax: [phone]

## 2015-11-16 ENCOUNTER — Other Ambulatory Visit: Payer: Self-pay | Admitting: *Deleted

## 2015-11-16 ENCOUNTER — Other Ambulatory Visit: Payer: BLUE CROSS/BLUE SHIELD

## 2015-11-16 ENCOUNTER — Ambulatory Visit (INDEPENDENT_AMBULATORY_CARE_PROVIDER_SITE_OTHER): Payer: BLUE CROSS/BLUE SHIELD | Admitting: Thoracic Surgery (Cardiothoracic Vascular Surgery)

## 2015-11-16 ENCOUNTER — Encounter: Payer: Self-pay | Admitting: Thoracic Surgery (Cardiothoracic Vascular Surgery)

## 2015-11-16 VITALS — BP 133/82 | HR 52 | Resp 20 | Ht 67.0 in | Wt 180.0 lb

## 2015-11-16 DIAGNOSIS — R222 Localized swelling, mass and lump, trunk: Secondary | ICD-10-CM | POA: Diagnosis not present

## 2015-11-16 DIAGNOSIS — I480 Paroxysmal atrial fibrillation: Secondary | ICD-10-CM | POA: Diagnosis not present

## 2015-11-16 DIAGNOSIS — I5189 Other ill-defined heart diseases: Secondary | ICD-10-CM

## 2015-11-16 DIAGNOSIS — I4891 Unspecified atrial fibrillation: Secondary | ICD-10-CM

## 2015-11-16 NOTE — Patient Instructions (Signed)
Patient has been instructed to stop taking Eliquis 7 days before your surgery (February 1st)  Patient should continue taking all other medications without change through the day before surgery.  Patient should have nothing to eat or drink after midnight the night before surgery.  On the morning of surgery patient should take only metoprolol with a sip of water.

## 2015-11-16 NOTE — Progress Notes (Signed)
Miranda Taylor       [place] [address]             [phone]     CARDIOTHORACIC SURGERY OFFICE NOTE  Referring Provider is Croitoru, Dani Gobble, MD PCP is Miranda Matter, PA-C   HPI:  The patient returns to the office today for follow-up of recurrent paroxysmal atrial fibrillation and atrial flutter with left atrial mass. She was originally seen in consultation on 05/18/2015 and she was seen more recently on 10/12/2015. Since then she underwent diagnostic cardiac catheterization by Dr. Gwenlyn Found. She was found to have normal coronary artery anatomy with no significant coronary artery disease. She also underwent CT angiography and she returns to our office today to discuss possible plans for elective surgery in the near future. She reports no new problems or complaints over the past month of low she states that she did have a cough last week that has resolved.  She has not had any palpitations or other symptoms to suggest a recurrence of atrial fibrillation or atrial flutter. She denies any symptoms of exertional shortness of breath or chest discomfort.   Current Outpatient Prescriptions  Medication Sig Dispense Refill  . amiodarone (PACERONE) 200 MG tablet Take 1 tablet (200 mg total) by mouth daily. 90 tablet 3  . apixaban (ELIQUIS) 5 MG TABS tablet Take 1 tablet (5 mg total) by mouth 2 (two) times daily. 180 tablet 3  . cycloSPORINE (RESTASIS) 0.05 % ophthalmic emulsion Place 1 drop into both eyes 2 (two) times daily.     Miranda Taylor Kitchen desvenlafaxine (PRISTIQ) 50 MG 24 hr tablet Take 50 mg by mouth every other day.     . furosemide (LASIX) 40 MG tablet Take one tablet by mouth daily as needed for swelling (Patient taking differently: Take 40 mg by mouth daily as needed (for swelling). ) 30 tablet 3  . LORazepam (ATIVAN) 0.5 MG tablet Take 0.5 mg by mouth at bedtime.     . metoprolol tartrate (LOPRESSOR) 25 MG tablet Take 1 tablet (25 mg total) by mouth 2 (two) times daily. 90 tablet 3   . Multiple Vitamin (MULITIVITAMIN WITH MINERALS) TABS Take 1 tablet by mouth daily.    Miranda Taylor Kitchen NEXIUM 40 MG capsule Take 40 mg by mouth daily as needed (for heartburn).     Miranda Taylor Kitchen oxyCODONE (ROXICODONE) 15 MG immediate release tablet Take 0.5-1 tablets (7.5-15 mg total) by mouth every 4 (four) hours as needed for pain. For pain (Patient taking differently: Take 7.5-15 mg by mouth daily as needed for pain. For pain) 30 tablet 0  . oxyCODONE-acetaminophen (PERCOCET/ROXICET) 5-325 MG tablet Take 1 tablet by mouth every 4 (four) hours as needed. (Patient taking differently: Take 1 tablet by mouth daily as needed for moderate pain. ) 16 tablet 0  . potassium chloride (K-DUR) 10 MEQ tablet Take one tablet by mouth daily when taking the fluid pill for swelling (Patient taking differently: Take 10 mEq by mouth daily as needed (with lasix). ) 30 tablet 3  . rizatriptan (MAXALT-MLT) 10 MG disintegrating tablet Take 10 mg by mouth daily as needed for migraine.     . triamcinolone cream (KENALOG) 0.1 % Apply 1 application topically daily as needed (for rash). Apply after bathing  0  . zolpidem (AMBIEN) 5 MG tablet Take 5 mg by mouth at bedtime as needed for sleep.      No current facility-administered medications for this visit.      Physical Exam:   BP  133/82 mmHg  Pulse 52  Resp 20  Ht 5\' 7"  (1.702 m)  Wt 180 lb (81.647 kg)  BMI 28.19 kg/m2  SpO2 96%  LMP 12/25/2012  General:  Well-appearing  Chest:   Clear to auscultation  CV:   Regular rate and rhythm without murmur  Incisions:  n/a  Abdomen:  Soft and nontender  Extremities:  Warm and well perfused  Diagnostic Tests:  CARDIAC CATHETERIZATION     History obtained from chart review. 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 She has paroxysmal A. Fib on Eliquis. She is scheduled for surgical resection of this in the upcoming future. She presents for coronary angiography to define her  coronary anatomy prior to her open heart procedure.    IMPRESSION:Miranda Taylor has normal coronary arteries. Her procedure was performed radially. The sheath was removed and a TR band was placed on the right wrist to achieve patent hemostasis. The patient left the lab in stable condition. She'll be discharged home today as an outpatient and will follow-up with Dr. Roxy Manns for her planned surgical resection of her left atrial mass.  Quay Burow. MD, [facility] 11/12/2015 12:14 PM        Indications    Atrial mass [R22.2 (ICD-10-CM)]    Technique and Indications    PROCEDURE DESCRIPTION:   The patient was brought to the second floor [facility] cath lab in the postabsorptive state. She was premedicated with Valium 5 mg by mouth, IV Versed and fentanyl. Her right wristwas prepped and shaved in usual sterile fashion. Xylocaine 1% was used for local anesthesia. A 6 French sheath was inserted into the right radial artery using standard Seldinger technique. 5 [place] TIG catheter was used for selective coronary angiography. Left ventriculography was not performed. Visipaque dye was used for the entirety of the case. Retrograde aortic pressure was monitored during the case. The patient did receive 4000 units of IV heparin as well as radial cocktail. The SideArm sheath.Estimated blood loss <50 mL. There were no immediate complications during the procedure.    Coronary Findings    Dominance: Right    Coronary Diagrams    Diagnostic Diagram            Implants    Name ID Temporary Type Supply   No information to display    PACS Images    Show images for Cardiac catheterization     Link to Procedure Log    Procedure Log      Hemo Data       Most Recent Value   AO Systolic Pressure  [id number] mmHg   AO Diastolic Pressure  76 mmHg   AO Mean  96 mmHg      CT ANGIOGRAPHY CHEST, ABDOMEN AND PELVIS  TECHNIQUE: Multidetector CT imaging through the chest, abdomen and  pelvis was performed using the standard protocol during bolus administration of intravenous contrast. Multiplanar reconstructed images and MIPs were obtained and reviewed to evaluate the vascular anatomy.  CONTRAST: 75 mL Isovue 370 administered intravenously  COMPARISON: Prior CT of the abdomen and pelvis 10/27/2015 ; prior CT scan of the chest 04/02/2012  FINDINGS: CTA CHEST FINDINGS  Mediastinum: Multiple bilateral thyroid nodules measuring up to 13 mm on the right (hypervascular with internal dystrophic calcifications) and 11 mm on the left (hypo attenuating with internal dystrophic calcifications). Both lesions appear minimally changed compared to prior CT scan of the chest dated 04/02/2012. No dedicated  follow-up imaging required for incidentally detected in nodules less than 15 mm. This follows ACR consensus guidelines: Managing Incidental Thyroid Nodules Detected on Imaging: White Paper of the ACR Incidental Thyroid Findings Committee. J Am Coll Radiol 2015; 12:143-150. Remainder of the thoracic inlet is unremarkable. No suspicious adenopathy. No suspicious mediastinal or hilar adenopathy. The thoracic esophagus is unremarkable.  Heart/Vascular: Conventional 3 vessel aortic arch anatomy. Mild scattered atherosclerotic vascular calcifications without significant stenosis. No evidence of aortic aneurysm or dissection. The maximal diameter of the ascending thoracic aorta is 3.5 cm. The heart is within normal limits for size. No pericardial effusion. No visible coronary artery calcifications within the limitations of non cardiac gated technique. Normal size main and central pulmonary arteries. No central pulmonary embolus. Mildly variant pulmonary venous anatomy. There is a right middle pulmonary vein resulting in 3 right-sided pulmonary venous ostia.  Lungs/Pleura: The lungs are clear save for trace dependent atelectasis. No significant emphysema, scarring or  bronchial wall thickening. Compared to the prior CT scan of the chest from 04/02/2012 there are 2 new nodular opacities in the right middle lobe. The first measures 6 mm (image 36 series 5) with faint surrounding tree-in-bud micro nodularity. The second is more linear in configuration measuring up to 1.3 cm 13 mm in length but only 4 mm in width. A small 2- 3 mm nodule in the right lower lobe (image 29 series 5) remains unchanged and is therefore benign given 2 year stability.  Bones/Soft Tissues: No acute fracture or aggressive appearing lytic or blastic osseous lesion.  Review of the MIP images confirms the above findings.  CTA ABDOMEN AND PELVIS FINDINGS  VASCULAR  Aorta: Normal caliber abdominal aorta. No aneurysm, ectasia or dissection. Trace atherosclerotic calcifications in the infrarenal segment.  Celiac: Widely patent.  SMA: Widely patent. The right hepatic artery is replaced to the SMA.  Renals: Bilateral solitary renal arteries without evidence of stenosis or fibromuscular dysplasia.  IMA: Patent and unremarkable.  Inflow: Mild atherosclerotic calcifications without evidence of stenosis, aneurysm or dissection.  Proximal Outflow: Mild calcified plaque along the posterior wall of the right common femoral artery. Otherwise, the visualized vessels are unremarkable bilaterally.  Veins: No focal venous abnormality.  NON-VASCULAR  Abdomen: Unremarkable CT appearance of the stomach, duodenum, adrenal glands and pancreas. Normal hepatic contour morphology. No discrete hepatic lesion. Gallbladder is unremarkable. No intra or extrahepatic biliary ductal dilatation.  No evidence of hydronephrosis or nephrolithiasis. No enhancing renal mass. 5.8 cm water attenuation simple cyst exophytic from the upper pole of the right kidney. 4.6 cm simple cyst exophytic from the lower pole of the right kidney. Small 9 mm low-attenuation lesion arising medially from  the lower pole of the right kidney is too small for accurate characterization. Statistically, this is favored to represent a benign cyst. Too small to characterize 7 mm low-attenuation lesion in the left interpolar kidney is also likely a benign cyst. There is focal renal cortical volume loss along the medial aspect of the kidney at the junction of the upper and interpolar region as well as several smaller areas of focal renal cortical thinning laterally and posteriorly. Findings likely reflect the sequelae of prior pyelonephritis or less likely remote infarcts.  No evidence of bowel obstruction or focal bowel wall thickening. The appendix is either completely decompressed or surgically absent. No suspicious adenopathy or free fluid.  Pelvis: Unremarkable appearance of the at adnexa, uterus and bladder. No free fluid or suspicious adenopathy.  Bones/Soft Tissues: No acute fracture or  aggressive appearing lytic or blastic osseous lesion. Surgical changes of right L4 -L5 left hemi posterior lumbar interbody fusion with right L4 laminectomy defect. There is an interbody graft. L3-L4 facet arthropathy. Mild L5-S1 degenerative disc disease.  Review of the MIP images confirms the above findings.  IMPRESSION: VASCULAR  1. No evidence of aortic aneurysm, dissection or other acute abnormality. 2. Mild scattered atherosclerotic vascular calcifications without evidence of significant stenosis. 3. Right hepatic artery is replaced to the SMA. 4. Mild variant anatomy of the pulmonary veins with a right middle pulmonary vein resulting in 3 right-sided pulmonary venous ostia. NON VASCULAR  1. No acute abnormality in the abdomen or pelvis. 2. Simple cyst exophytic from the upper and lower poles of the right kidney. Additional sub cm low-attenuation renal lesions bilaterally are too small to characterize but also statistically likely benign cysts. 3. Multifocal regions of renal  cortical scarring on the right which may be the sequelae of prior infectious, inflammatory or ischemic episodes. Given the distribution, prior reflux nephropathy is considered unlikely. 4. Lower lumbar degenerative disc disease and facet arthropathy with surgical changes of prior right L4 laminectomy and left L4-L5 hemi posterior lumbar interbody fusion.  Signed,  Criselda Peaches, MD  [facility]  [facility]  Electronically Signed: By: Jacqulynn Cadet M.D. On: 11/11/2015 10:48 ADDENDUM REPORT: 11/11/2015 13:34 ADDENDUM: Please note that the following findings which were listed in the body of the report should also have been included in the impression. IMPRESSION: IMPRESSION New nonspecific nodules in the right middle lobe measuring up to 6 mm in diameter. There is a suggestion of adjacent tree-in-bud micro nodularity and therefore this is favored to represent either bronchial impaction, or an infectious/inflammatory process. Follow-up imaging is recommended to ensure stability/resolution and exclude a malignant process. If the patient is at high risk for bronchogenic carcinoma, follow-up chest CT at 6-12 months is recommended. If the patient is at low risk for bronchogenic carcinoma, follow-up chest CT at 12 months is recommended. This recommendation follows the consensus statement: Guidelines for Management of Small Pulmonary Nodules Detected on CT Scans: A Statement from the [employer] as published in Radiology 2005;237:395-400. These results will be called to the ordering clinician or representative by the Radiologist Assistant, and communication documented in the PACS or zVision Dashboard. Electronically Signed  By: Jacqulynn Cadet M.D.  On: 11/11/2015 13:34    Impression:  Patient has a small mass adherent to the endocardial surface of the left atrium along the superior ribs of the left atrial  appendage adjacent to the orifice of the left inferior pulmonary vein. I have personally reviewed the patient's previous transesophageal echocardiograms.  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 The mass has persisted despite several months of continuous anticoagulation.  I have personally reviewed the patient's recent diagnostic cardiac catheterization and CT angiogram. She does not have any significant coronary artery disease and she appears to be a reasonably good candidate for minimally invasive approach for surgery.   Plan:  I discussed the indications, risks, and potential benefits of surgical resection of the small left atrial mass with the patient in the office today. Alternative treatment strategies been discussed including concerns regarding the long-term risk of embolism and stroke. The patient understands and accepts all potential associated risks of surgery including but not limited to risk of death, stroke, myocardial infarction, congestive heart failure, respiratory failure, renal failure, bleeding requiring blood transfusion and/or reexploration, arrhythmia, heart block or  bradycardia requiring permanent pacemaker, pneumonia, pleural effusion, wound infection, pulmonary embolus or other thromboembolic complication, chronic pain or other delayed complications. The relative risks and benefits of performing a maze procedure at the time of her surgery was discussed at length, including the expected likelihood of long term freedom from recurrent symptomatic atrial fibrillation and/or atrial flutter. Alternative surgical approaches have been discussed including a comparison between conventional sternotomy and minimally-invasive techniques. The relative risks and benefits of each have been reviewed as they pertain to the patient's specific circumstances, and all of their questions have been addressed. Specific risks potentially related  to the minimally-invasive approach were discussed at length, including but not limited to risk of conversion to full or partial sternotomy, aortic dissection or other major vascular complication, unilateral acute lung injury or pulmonary edema, phrenic nerve dysfunction or paralysis, rib fracture, chronic pain, lung hernia, or lymphocele.   We plan to proceed with surgery on Wednesday, 12/16/2015. The patient has been instructed to stop taking Eliquis 7 days prior to surgery. All of her questions have been addressed.  I spent in excess of 15 minutes during the conduct of this office consultation and >50% of this time involved direct face-to-face encounter with the patient for counseling and/or coordination of their care.    Valentina Gu. Roxy Manns, MD 11/16/2015 4:16 PM

## 2015-11-19 ENCOUNTER — Other Ambulatory Visit: Payer: BLUE CROSS/BLUE SHIELD

## 2015-11-19 ENCOUNTER — Encounter: Payer: BLUE CROSS/BLUE SHIELD | Admitting: Podiatry

## 2015-11-20 ENCOUNTER — Ambulatory Visit (INDEPENDENT_AMBULATORY_CARE_PROVIDER_SITE_OTHER): Payer: BLUE CROSS/BLUE SHIELD | Admitting: Podiatry

## 2015-11-20 DIAGNOSIS — Z9889 Other specified postprocedural states: Secondary | ICD-10-CM

## 2015-11-20 DIAGNOSIS — M722 Plantar fascial fibromatosis: Secondary | ICD-10-CM

## 2015-11-22 NOTE — Progress Notes (Signed)
Subjective:     Patient ID: Miranda Taylor, female   DOB: 05/13/1961, 55 y.o.   MRN: [id number]  HPI patient states I'm doing fine with occasional discomfort if I been on it too long   Review of Systems     Objective:   Physical Exam Neurovascular status wound edges well coapted with mild pain in the right arch and dorsum foot that's localized    Assessment:     Appears to be more just over use type inflammation but does not appear to be related to surgical intervention    Plan:     Advised on physical therapy supportive shoes and boot usage as needed and this should completely resolve and reappoint as needed

## 2015-12-09 DIAGNOSIS — A419 Sepsis, unspecified organism: Secondary | ICD-10-CM

## 2015-12-09 HISTORY — DX: Sepsis, unspecified organism: A41.9

## 2015-12-14 ENCOUNTER — Encounter (HOSPITAL_COMMUNITY)
Admission: RE | Admit: 2015-12-14 | Discharge: 2015-12-14 | Disposition: A | Discharge status: Home / Self Care | Payer: BLUE CROSS/BLUE SHIELD | Source: Ambulatory Visit | Attending: Thoracic Surgery (Cardiothoracic Vascular Surgery) | Admitting: Thoracic Surgery (Cardiothoracic Vascular Surgery)

## 2015-12-14 ENCOUNTER — Encounter (HOSPITAL_COMMUNITY): Payer: Self-pay

## 2015-12-14 ENCOUNTER — Other Ambulatory Visit (HOSPITAL_COMMUNITY): Payer: Self-pay | Admitting: *Deleted

## 2015-12-14 ENCOUNTER — Encounter (HOSPITAL_COMMUNITY): Payer: BLUE CROSS/BLUE SHIELD

## 2015-12-14 ENCOUNTER — Ambulatory Visit (HOSPITAL_BASED_OUTPATIENT_CLINIC_OR_DEPARTMENT_OTHER)
Admission: RE | Admit: 2015-12-14 | Discharge: 2015-12-14 | Disposition: A | Discharge status: Home / Self Care | Payer: BLUE CROSS/BLUE SHIELD | Source: Ambulatory Visit | Attending: Thoracic Surgery (Cardiothoracic Vascular Surgery) | Admitting: Thoracic Surgery (Cardiothoracic Vascular Surgery)

## 2015-12-14 VITALS — BP 110/54 | HR 50 | Temp 98.5°F | Resp 20 | Ht 68.0 in | Wt 196.5 lb

## 2015-12-14 DIAGNOSIS — R9431 Abnormal electrocardiogram [ECG] [EKG]: Secondary | ICD-10-CM | POA: Insufficient documentation

## 2015-12-14 DIAGNOSIS — I5189 Other ill-defined heart diseases: Secondary | ICD-10-CM

## 2015-12-14 DIAGNOSIS — R222 Localized swelling, mass and lump, trunk: Secondary | ICD-10-CM

## 2015-12-14 DIAGNOSIS — R001 Bradycardia, unspecified: Secondary | ICD-10-CM | POA: Insufficient documentation

## 2015-12-14 DIAGNOSIS — I4891 Unspecified atrial fibrillation: Secondary | ICD-10-CM | POA: Diagnosis not present

## 2015-12-14 DIAGNOSIS — Z87891 Personal history of nicotine dependence: Secondary | ICD-10-CM | POA: Insufficient documentation

## 2015-12-14 DIAGNOSIS — Z01810 Encounter for preprocedural cardiovascular examination: Secondary | ICD-10-CM | POA: Insufficient documentation

## 2015-12-14 DIAGNOSIS — Z01818 Encounter for other preprocedural examination: Secondary | ICD-10-CM

## 2015-12-14 DIAGNOSIS — Z01812 Encounter for preprocedural laboratory examination: Secondary | ICD-10-CM

## 2015-12-14 DIAGNOSIS — R918 Other nonspecific abnormal finding of lung field: Secondary | ICD-10-CM

## 2015-12-14 HISTORY — DX: Major depressive disorder, single episode, unspecified: F32.9

## 2015-12-14 HISTORY — DX: Cardiac arrhythmia, unspecified: I49.9

## 2015-12-14 HISTORY — DX: Essential (primary) hypertension: I10

## 2015-12-14 HISTORY — DX: Depression, unspecified: F32.A

## 2015-12-14 HISTORY — DX: Anxiety disorder, unspecified: F41.9

## 2015-12-14 HISTORY — DX: Unspecified osteoarthritis, unspecified site: M19.90

## 2015-12-14 HISTORY — DX: Cardiac murmur, unspecified: R01.1

## 2015-12-14 LAB — COMPREHENSIVE METABOLIC PANEL WITH GFR
ALT: 14 U/L (ref 14–54)
AST: 17 U/L (ref 15–41)
Albumin: 3.5 g/dL (ref 3.5–5.0)
Alkaline Phosphatase: 101 U/L (ref 38–126)
Anion gap: 10 (ref 5–15)
BUN: 17 mg/dL (ref 6–20)
CO2: 25 mmol/L (ref 22–32)
Calcium: 9.3 mg/dL (ref 8.9–10.3)
Chloride: 107 mmol/L (ref 101–111)
Creatinine, Ser: 1.04 mg/dL — ABNORMAL HIGH (ref 0.44–1.00)
GFR calc Af Amer: >60 mL/min (ref >60)
GFR calc non Af Amer: 60 mL/min — ABNORMAL LOW (ref >60)
Glucose, Bld: 84 mg/dL (ref 65–99)
Potassium: 3.9 mmol/L (ref 3.5–5.1)
Sodium: 142 mmol/L (ref 135–145)
Total Bilirubin: 0.7 mg/dL (ref 0.3–1.2)
Total Protein: 6.6 g/dL (ref 6.5–8.1)

## 2015-12-14 LAB — CBC
HCT: 38.1 % (ref 36.0–46.0)
Hemoglobin: 12.2 g/dL (ref 12.0–15.0)
MCH: 27.9 pg (ref 26.0–34.0)
MCHC: 32 g/dL (ref 30.0–36.0)
MCV: 87.2 fL (ref 78.0–100.0)
Platelets: 217 K/uL (ref 150–400)
RBC: 4.37 MIL/uL (ref 3.87–5.11)
RDW: 13.9 % (ref 11.5–15.5)
WBC: 6.1 K/uL (ref 4.0–10.5)

## 2015-12-14 LAB — TYPE AND SCREEN
ABO/RH(D): A POS
Antibody Screen: NEGATIVE

## 2015-12-14 LAB — BLOOD GAS, ARTERIAL
Acid-base deficit: 1 mmol/L (ref 0.0–2.0)
BICARBONATE: 23.3 meq/L (ref 20.0–24.0)
Drawn by: 449841
FIO2: 0.21
O2 Saturation: 96 %
PATIENT TEMPERATURE: 98.6
PH ART: 7.394 (ref 7.350–7.450)
PO2 ART: 83.2 mmHg (ref 80.0–100.0)
TCO2: 24.4 mmol/L (ref 0–100)
pCO2 arterial: 38.9 mmHg (ref 35.0–45.0)

## 2015-12-14 LAB — URINALYSIS, ROUTINE W REFLEX MICROSCOPIC
Bilirubin Urine: NEGATIVE
Glucose, UA: NEGATIVE mg/dL
Ketones, ur: NEGATIVE mg/dL
Leukocytes, UA: NEGATIVE
Nitrite: NEGATIVE
PROTEIN: NEGATIVE mg/dL
Specific Gravity, Urine: 1.024 (ref 1.005–1.030)
pH: 5.5 (ref 5.0–8.0)

## 2015-12-14 LAB — APTT: aPTT: 26 s (ref 24–37)

## 2015-12-14 LAB — PROTIME-INR
INR: 1.04 (ref 0.00–1.49)
PROTHROMBIN TIME: 13.8 s (ref 11.6–15.2)

## 2015-12-14 LAB — SURGICAL PCR SCREEN
MRSA, PCR: NEGATIVE
Staphylococcus aureus: POSITIVE — AB

## 2015-12-14 LAB — URINE MICROSCOPIC-ADD ON

## 2015-12-14 NOTE — Progress Notes (Signed)
Pre-op Cardiac Surgery  Carotid Findings:  Bilateral: No significant (1-39%) ICA stenosis. Antegrade vertebral flow.     Upper Extremity Right Left  Brachial Pressures 121 123  Radial Waveforms Tri Tri  Ulnar Waveforms Tri Tri  Palmar Arch (Allen's Test) Normal  Decreases with radial compression, but not >50%. Normal with ulnar compression.    Landry Mellow, RDMS, RVT 12/14/2015

## 2015-12-14 NOTE — Progress Notes (Signed)
PCP- Valentina Lucks PCP- A. Michaels, Pt. Followed by Dr. Adora Fridge, cardiac testing all in EPIC, pt. Has already stopped Eliquis.

## 2015-12-14 NOTE — Pre-Procedure Instructions (Signed)
MICHALENA DELBIANCO  12/14/2015      [facility], [place], [place] - [address] [place] [address] Phone: [phone] Fax: [phone]  [id number] - [place], [place] - [address] [place] [address] Phone: [phone] Fax: [phone]    Your procedure is scheduled on 12/16/2015.  Report to [facility] Admitting at 6:30 A.M.  Call this number if you have problems the morning of surgery:  [phone]   Remember:  Do not eat food or drink liquids after midnight.  On Tuesday   Take these medicines the morning of surgery with A SIP OF WATER :  Metoprolol, Amiodarone, Pristiq, & if needed may take pain medicine &/or Nexium     Do not wear jewelry, make-up or nail polish.   Do not wear lotions, powders, or perfumes.     Do not shave 48 hours prior to surgery.     Do not bring valuables to the hospital.   [facility] is not responsible for any belongings or valuables.  Contacts, dentures or bridgework may not be worn into surgery.  Leave your suitcase in the car.  After surgery it may be brought to your room.  For patients admitted to the hospital, discharge time will be determined by your treatment team.  Patients discharged the day of surgery will not be allowed to drive home.   Name and phone number of your driver:   With family  Special instructions:  Special Instructions: [place] - Preparing for Surgery  Before surgery, you can play an important role.  Because skin is not sterile, your skin needs to be as free of germs as possible.  You can reduce the number of germs on you skin by washing with CHG (chlorahexidine gluconate) soap before surgery.  CHG is an antiseptic cleaner which kills germs and bonds with the skin to continue killing germs even after washing.  Please DO NOT use if you have an allergy to CHG or antibacterial soaps.  If  your skin becomes reddened/irritated stop using the CHG and inform your nurse when you arrive at Short Stay.  Do not shave (including legs and underarms) for at least 48 hours prior to the first CHG shower.  You may shave your face.  Please follow these instructions carefully:   1.  Shower with CHG Soap the night before surgery and the  morning of Surgery.  2.  If you choose to wash your hair, wash your hair first as usual with your  normal shampoo.  3.  After you shampoo, rinse your hair and body thoroughly to remove the  Shampoo.  4.  Use CHG as you would any other liquid soap.  You can apply chg directly to the skin and wash gently with scrungie or a clean washcloth.  5.  Apply the CHG Soap to your body ONLY FROM THE NECK DOWN.    Do not use on open wounds or open sores.  Avoid contact with your eyes, ears, mouth and genitals (private parts).  Wash genitals (private parts)   with your normal soap.  6.  Wash thoroughly, paying special attention to the area where your surgery will be performed.  7.  Thoroughly rinse your body with warm water from the neck down.  8.  DO NOT shower/wash with your normal soap after using and  rinsing off   the CHG Soap.  9.  Pat yourself dry with a clean towel.            10.  Wear clean pajamas.            11.  Place clean sheets on your bed the night of your first shower and do not sleep with pets.  Day of Surgery  Do not apply any lotions/deodorants the morning of surgery.  Please wear clean clothes to the hospital/surgery center.  Please read over the following fact sheets that you were given. Pain Booklet, Coughing and Deep Breathing, Blood Transfusion Information, MRSA Information and Surgical Site Infection Prevention

## 2015-12-15 ENCOUNTER — Ambulatory Visit (HOSPITAL_COMMUNITY)
Admission: RE | Admit: 2015-12-15 | Discharge: 2015-12-15 | Disposition: A | Discharge status: Home / Self Care | Payer: BLUE CROSS/BLUE SHIELD | Source: Ambulatory Visit | Attending: Thoracic Surgery (Cardiothoracic Vascular Surgery) | Admitting: Thoracic Surgery (Cardiothoracic Vascular Surgery)

## 2015-12-15 DIAGNOSIS — R222 Localized swelling, mass and lump, trunk: Secondary | ICD-10-CM | POA: Insufficient documentation

## 2015-12-15 DIAGNOSIS — I4891 Unspecified atrial fibrillation: Secondary | ICD-10-CM | POA: Insufficient documentation

## 2015-12-15 DIAGNOSIS — I5189 Other ill-defined heart diseases: Secondary | ICD-10-CM

## 2015-12-15 LAB — PULMONARY FUNCTION TEST
DL/VA % pred: 72 %
DL/VA: 3.73 ml/min/mmHg/L
DLCO COR % PRED: 73 %
DLCO COR: 20.87 ml/min/mmHg
DLCO UNC % PRED: 70 %
DLCO UNC: 20.06 ml/min/mmHg
FEF 25-75 POST: 3.77 L/s
FEF 25-75 Pre: 3.09 L/sec
FEF2575-%Change-Post: 22 %
FEF2575-%Pred-Post: 136 %
FEF2575-%Pred-Pre: 111 %
FEV1-%CHANGE-POST: 5 %
FEV1-%PRED-POST: 110 %
FEV1-%Pred-Pre: 104 %
FEV1-POST: 3.3 L
FEV1-Pre: 3.14 L
FEV1FVC-%Change-Post: 5 %
FEV1FVC-%PRED-PRE: 101 %
FEV6-%Change-Post: 0 %
FEV6-%PRED-PRE: 104 %
FEV6-%Pred-Post: 105 %
FEV6-POST: 3.9 L
FEV6-PRE: 3.86 L
FEV6FVC-%Change-Post: 0 %
FEV6FVC-%PRED-POST: 103 %
FEV6FVC-%PRED-PRE: 102 %
FVC-%Change-Post: 0 %
FVC-%PRED-POST: 102 %
FVC-%PRED-PRE: 101 %
FVC-POST: 3.9 L
FVC-PRE: 3.9 L
PRE FEV6/FVC RATIO: 99 %
Post FEV1/FVC ratio: 85 %
Post FEV6/FVC ratio: 100 %
Pre FEV1/FVC ratio: 81 %
RV % pred: 103 %
RV: 2.09 L
TLC % PRED: 109 %
TLC: 6 L

## 2015-12-15 LAB — HEMOGLOBIN A1C
Hgb A1c MFr Bld: 5.5 % (ref 4.8–5.6)
Mean Plasma Glucose: 111 mg/dL

## 2015-12-15 MED ORDER — EPINEPHRINE HCL 1 MG/ML IJ SOLN
0.0000 ug/min | INTRAVENOUS | Status: DC
  Filled 2015-12-15: qty 4

## 2015-12-15 MED ORDER — SODIUM CHLORIDE 0.9 % IV SOLN
INTRAVENOUS | Status: DC
  Filled 2015-12-15: qty 40

## 2015-12-15 MED ORDER — DEXTROSE 5 % IV SOLN
1.5000 g | INTRAVENOUS | Status: AC
  Administered 2015-12-16: .75 g via INTRAVENOUS
  Administered 2015-12-16: 1.5 g via INTRAVENOUS
  Filled 2015-12-15: qty 1.5

## 2015-12-15 MED ORDER — PHENYLEPHRINE HCL 10 MG/ML IJ SOLN
30.0000 ug/min | INTRAVENOUS | Status: DC
  Filled 2015-12-15: qty 2

## 2015-12-15 MED ORDER — ALBUTEROL SULFATE (2.5 MG/3ML) 0.083% IN NEBU
2.5000 mg | INHALATION_SOLUTION | Freq: Once | RESPIRATORY_TRACT | Status: AC
  Administered 2015-12-15: 2.5 mg via RESPIRATORY_TRACT

## 2015-12-15 MED ORDER — SODIUM CHLORIDE 0.9 % IV SOLN
INTRAVENOUS | Status: DC
  Filled 2015-12-15: qty 30

## 2015-12-15 MED ORDER — CEFUROXIME SODIUM 750 MG IJ SOLR
750.0000 mg | INTRAMUSCULAR | Status: DC
  Filled 2015-12-15: qty 750

## 2015-12-15 MED ORDER — METOPROLOL TARTRATE 12.5 MG HALF TABLET: 12.5000 mg | ORAL_TABLET | Freq: Once | ORAL | Status: DC

## 2015-12-15 MED ORDER — PLASMA-LYTE 148 IV SOLN
INTRAVENOUS | Status: DC
  Filled 2015-12-15: qty 2.5

## 2015-12-15 MED ORDER — DOPAMINE-DEXTROSE 3.2-5 MG/ML-% IV SOLN
0.0000 ug/kg/min | INTRAVENOUS | Status: DC
  Filled 2015-12-15: qty 250

## 2015-12-15 MED ORDER — MAGNESIUM SULFATE 50 % IJ SOLN
40.0000 meq | INTRAMUSCULAR | Status: DC
  Filled 2015-12-15: qty 10

## 2015-12-15 MED ORDER — VANCOMYCIN HCL 10 G IV SOLR
1500.0000 mg | INTRAVENOUS | Status: AC
  Administered 2015-12-16: 1500 mg via INTRAVENOUS
  Filled 2015-12-15: qty 1500

## 2015-12-15 MED ORDER — VANCOMYCIN HCL 1000 MG IV SOLR
INTRAVENOUS | Status: AC
  Administered 2015-12-16: 1000 mL
  Filled 2015-12-15: qty 1000

## 2015-12-15 MED ORDER — CHLORHEXIDINE GLUCONATE 4 % EX LIQD: 30.0000 mL | CUTANEOUS | Status: DC

## 2015-12-15 MED ORDER — DEXMEDETOMIDINE HCL IN NACL 400 MCG/100ML IV SOLN
0.1000 ug/kg/h | INTRAVENOUS | Status: DC
  Filled 2015-12-15: qty 100

## 2015-12-15 MED ORDER — NITROGLYCERIN IN D5W 200-5 MCG/ML-% IV SOLN
2.0000 ug/min | INTRAVENOUS | Status: AC
  Administered 2015-12-16: 16.6 ug/min via INTRAVENOUS
  Filled 2015-12-15: qty 250

## 2015-12-15 MED ORDER — POTASSIUM CHLORIDE 2 MEQ/ML IV SOLN
80.0000 meq | INTRAVENOUS | Status: DC
  Filled 2015-12-15: qty 40

## 2015-12-15 MED ORDER — SODIUM CHLORIDE 0.9 % IV SOLN
INTRAVENOUS | Status: DC
  Filled 2015-12-15: qty 2.5

## 2015-12-15 MED ORDER — CHLORHEXIDINE GLUCONATE 0.12 % MT SOLN
15.0000 mL | Freq: Once | OROMUCOSAL | Status: AC
  Administered 2015-12-16: 15 mL via OROMUCOSAL
  Filled 2015-12-15: qty 15

## 2015-12-15 NOTE — Anesthesia Preprocedure Evaluation (Addendum)
Anesthesia Evaluation  Patient identified by MRN, date of birth, ID band Patient awake    Reviewed: Allergy & Precautions, NPO status , Patient's Chart, lab work & pertinent test results  Airway Mallampati: II  TM Distance: >3 FB Neck ROM: Full    Dental  (+) Teeth Intact, Dental Advisory Given   Pulmonary former smoker,    breath sounds clear to auscultation       Cardiovascular hypertension,  Rhythm:Regular Rate:Normal     Neuro/Psych    GI/Hepatic   Endo/Other    Renal/GU      Musculoskeletal   Abdominal   Peds  Hematology   Anesthesia Other Findings   Reproductive/Obstetrics                            Anesthesia Physical Anesthesia Plan  ASA: III  Anesthesia Plan: General   Post-op Pain Management:    Induction: Intravenous  Airway Management Planned: Oral ETT and Double Lumen EBT  Additional Equipment: Arterial line, 3D TEE, CVP and PA Cath  Intra-op Plan:   Post-operative Plan: Post-operative intubation/ventilation  Informed Consent: I have reviewed the patients History and Physical, chart, labs and discussed the procedure including the risks, benefits and alternatives for the proposed anesthesia with the patient or authorized representative who has indicated his/her understanding and acceptance.   Dental advisory given  Plan Discussed with: CRNA and Anesthesiologist  Anesthesia Plan Comments:         Anesthesia Quick Evaluation

## 2015-12-15 NOTE — H&P (Signed)
[address]       [place] [address]             [phone]          CARDIOTHORACIC SURGERY HISTORY AND PHYSICAL EXAM  Referring Provider is Croitoru, Dani Gobble, MD  Primary Cardiologist is Lorretta Harp, MD PCP is PROVIDER NOT IN SYSTEM  Chief Complaint  Patient presents with  . Atrial Fibrillation    eval for MINI EXPOSURE for excision of atrial mass/myxoma.Marland KitchenMarland KitchenTEE 05/14/15    HPI:  Patient is a 55 year old female with history of recurrent paroxysmal atrial fibrillation and atrial flutter who has been referred for surgical consultation to consider surgical management of possible left atrial mass and atrial fibrillation. The patient was first diagnosed with atrial fibrillation in 2013. She initially presented with increased urinary urgency and frequency related to chronic recurrent urinary tract infections. She was found to be in atrial fibrillation with rapid ventricular response at the time. She spontaneously converted to sinus rhythm and initially was treated with beta blocker therapy. She has been followed ever since intermittently by Dr. Gwenlyn Found, Dr. Rayann Heman, and Dr. Lovena Le. She does not experience symptoms of Chest pain nor frequent palpitations, and she reports that her primary symptoms associated with recurrent episodes of atrial fibrillation are decreased exercise tolerance and fatigue. She has been placed on an event monitor in the past that has documented continued episodes of recurrent paroxysmal atrial fibrillation. She at times has palpitations, although these symptoms are not typically limiting or problematic. She was initially not treated with anticoagulation due to low CHADSVASC score. She was hospitalized March 29 through April 6 of this year with atrial flutter associated with rapid ventricular response. At the time she presented with symptoms of increased fatigue. She ruled out for acute myocardial infarction. She was started on Eliquis  for anticoagulation and TEE was performed with intervention of proceeding with DC cardioversion. However, TEE reviewed a small mass in the left atrial appendage consistent with left atrial thrombus. Cardioversion was canceled and the patient was treated with beta blocker and diltiazem for rate control. Amiodarone was added prior to hospital discharge. She was seen in follow-up by Dr. Lovena Le on 03/31/2015 at which time she was noted to have converted spontaneously to sinus rhythm, although she remained bradycardic while in sinus rhythm.. She was referred back to Dr. Rayann Heman to consider possible ablation for recurrent atrial fibrillation and atrial flutter. She was seen in consultation by Dr. Rayann Heman 04/10/2015. Her dose of amiodarone was decreased and diltiazem was stopped. Plans were made for elective catheter-based ablation pending results of repeat TEE. Follow-up TEE performed by Dr. Sallyanne Kuster on 05/14/2015 revealed persistent small mass in the left atrial appendage consistent with possible mass versus thrombus. The patient was referred for surgical consultation.       She was originally evaluated in consultation on 05/18/2015 after she had undergone transesophageal echocardiogram In anticipation of possible catheter-based ablation for atrial fibrillation. TEE performed 05/14/2015 revealed a small mass in the left atrial appendage felt to be consistent with either thrombus or possible benign tumor. Options were discussed at that time, and a decision was made to keep the patient on anticoagulation for 3 months and return for repeat TEE. Follow-up TEE was performed 09/09/2015 and revealed that the small mass persisted despite continuous anticoagulation using Eliquis.   Since then she underwent diagnostic cardiac catheterization by Dr. Gwenlyn Found. She was found to have normal coronary artery anatomy with no significant  coronary artery disease. She also underwent CT angiography and she returns to our office today  to discuss possible plans for elective surgery in the near future. She reports no new problems or complaints over the past month of low she states that she did have a cough last week that has resolved. She has not had any palpitations or other symptoms to suggest a recurrence of atrial fibrillation or atrial flutter. She denies any symptoms of exertional shortness of breath or chest discomfort.  The patient is married and lives with her husband in [place]. She has been disabled for the last several years because of problems with chronic degenerative joint disease in her back. She previously worked in the [employer]. She describes a long history of mild symptoms of exertional shortness of breath and fatigue. Symptoms seem to wax and wane, and according to the patient her exertional shortness of breath and decreased energy coincide with episodes when she is out of rhythm. She denies any history of chest pain or chest tightness either with activity or at rest. She has occasional palpitations. She had lower extremity edema while taking diltiazem, but this has resolved. She has never had any dizzy spells or syncope.   Past Medical History  Diagnosis Date  . GERD (gastroesophageal reflux disease)   . Chronic lower back pain   . Paroxysmal atrial fibrillation (HCC)   . Spinal stenosis   . SUI (stress urinary incontinence, female)   . History of kidney stones   . TMJ (temporomandibular joint syndrome)     LEFT SIDE--  WEAR  MOUTH GUARD  . H/O hiatal hernia   . Plantar fasciitis   . Typical atrial flutter ([place])   . 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 Heart murmur   . Hypertension   . Dysrhythmia 02/2015    afib  . Depression   . Anxiety   . Kidney stone     passed spont.  . Migraines     h/o migraines   . Arthritis     low back, degenerative spine     Past Surgical History  Procedure Laterality Date  . Anterior cervical decomp/discectomy fusion  04-08-2010    C4 -- C5  .  Posterior fusion lumbar spine  09-16-2009    L4 -- L5  . Appendectomy  1984  . Transthoracic echocardiogram  04-03-2012    normal LVF/  ef 55-60%  . Cardiovascular stress test  04-27-2014   dr berry    normal perfusion study/  no ischemia/  ef 61%  . Lasik  1990's  . Bilateral thunb joint arthrodesis  left 2007/   right 2010  . Negative sleep study  04-15-2013  . Cystoscopy N/A 08/04/2014    Procedure: CYSTOSCOPY;  Surgeon: Ardis Hughs, MD;  Location: [facility];  Service: Urology;  Laterality: N/A;  . Pubovaginal sling N/A 08/04/2014    Procedure: BOSTON SCIENTIFIC RETRO-PUBIC MID URETHRAL SLING;  Surgeon: Ardis Hughs, MD;  Location[facility];  Service: Urology;  Laterality: N/A;  . Tee without cardioversion N/A 02/06/2015    Procedure: TRANSESOPHAGEAL ECHOCARDIOGRAM (TEE);  Surgeon: Lelon Perla, MD;  Location: [facility] ENDOSCOPY;  Service: Cardiovascular;  Laterality: N/A;  . Tee without cardioversion N/A 05/14/2015    Procedure: TRANSESOPHAGEAL ECHOCARDIOGRAM (TEE);  Surgeon: Sanda Klein, MD;  Location: [facility] ENDOSCOPY;  Service: Cardiovascular;  Laterality: N/A;  . Tee without cardioversion N/A 09/02/2015    Procedure: TRANSESOPHAGEAL ECHOCARDIOGRAM (TEE);  Surgeon: Tressia Miners  Remonia Richter, MD;  Location: [facility];  Service: Cardiovascular;  Laterality: N/A;  . Tee without cardioversion N/A 09/09/2015    Procedure: TRANSESOPHAGEAL ECHOCARDIOGRAM (TEE);  Surgeon: Lelon Perla, MD;  Location[facility] ENDOSCOPY;  Service: Cardiovascular;  Laterality: N/A;    Family History  Problem Relation Age of Onset  . Hypertension    . Heart attack Father 18  . Heart disease Mother   . CAD Father 10  . Arrhythmia Father     Social History Social History  Substance Use Topics  . Smoking status: Former Smoker -- 0.50 packs/day for 20 years    Types: Cigarettes    Quit date: 11/07/2001  . Smokeless tobacco: Never Used  . Alcohol Use: No    Prior to  Admission medications   Medication Sig Start Date End Date Taking? Authorizing Provider  amiodarone (PACERONE) 200 MG tablet Take 1 tablet (200 mg total) by mouth daily. 10/19/15  Yes Rhonda G Barrett, PA-C  apixaban (ELIQUIS) 5 MG TABS tablet Take 1 tablet (5 mg total) by mouth 2 (two) times daily. 10/19/15  Yes Rhonda G Barrett, PA-C  cycloSPORINE (RESTASIS) 0.05 % ophthalmic emulsion Place 1 drop into both eyes 2 (two) times daily as needed (for dry eyes).    Yes Historical Provider, MD  desvenlafaxine (PRISTIQ) 50 MG 24 hr tablet Take 100 mg by mouth daily before breakfast.    Yes Historical Provider, MD  furosemide (LASIX) 40 MG tablet Take one tablet by mouth daily as needed for swelling Patient taking differently: Take 40 mg by mouth daily as needed (for swelling).  03/17/15  Yes Evans Lance, MD  LORazepam (ATIVAN) 0.5 MG tablet Take 0.5 mg by mouth at bedtime.    Yes Historical Provider, MD  metoprolol tartrate (LOPRESSOR) 25 MG tablet Take 1 tablet (25 mg total) by mouth 2 (two) times daily. 10/19/15  Yes Rhonda G Barrett, PA-C  Multiple Vitamin (MULITIVITAMIN WITH MINERALS) TABS Take 1 tablet by mouth daily.   Yes Historical Provider, MD  NEXIUM 40 MG capsule Take 40 mg by mouth daily as needed (for heartburn).  06/25/12  Yes Historical Provider, MD  oxyCODONE (ROXICODONE) 15 MG immediate release tablet Take 0.5-1 tablets (7.5-15 mg total) by mouth every 4 (four) hours as needed for pain. For pain Patient taking differently: Take 7.5-15 mg by mouth 2 (two) times daily as needed for pain. For pain 09/02/15  Yes Sueanne Margarita, MD  polyethylene glycol powder (GLYCOLAX/MIRALAX) powder Take 17 g by mouth every other day. 10/29/15  Yes Historical Provider, MD  potassium chloride (K-DUR) 10 MEQ tablet Take one tablet by mouth daily when taking the fluid pill for swelling Patient taking differently: Take 10 mEq by mouth daily as needed (with lasix).  03/17/15  Yes Evans Lance, MD  rizatriptan  (MAXALT-MLT) 10 MG disintegrating tablet Take 10 mg by mouth daily as needed for migraine.  02/26/13  Yes Historical Provider, MD  triamcinolone cream (KENALOG) 0.1 % Apply 1 application topically daily as needed (for rash). Apply after bathing 10/22/15  Yes Historical Provider, MD  zolpidem (AMBIEN) 5 MG tablet Take 5 mg by mouth at bedtime as needed for sleep.  02/12/15 02/12/16 Yes Historical Provider, MD    No Known Allergies    Review of Systems:  General:normal appetite, decreased energy, no weight gain, no weight loss, no fever Cardiac:no chest pain with exertion, no chest pain at rest, + SOB with exertion, no resting SOB, no PND, no orthopnea, +  palpitations, + arrhythmia, + atrial fibrillation, no LE edema, no dizzy spells, no syncope Respiratory:no shortness of breath, no home oxygen, no productive cough, no dry cough, no bronchitis, no wheezing, no hemoptysis, no asthma, no pain with inspiration or cough, no sleep apnea, no CPAP at night GI:no difficulty swallowing, + reflux, no frequent heartburn, no hiatal hernia, no abdominal pain, no constipation, no diarrhea, no hematochezia, no hematemesis, no melena GU:no dysuria, no frequency, + recurrent urinary tract infection, no hematuria, no kidney stones, no kidney disease Vascular:no pain suggestive of claudication, no pain in feet, no leg cramps, no varicose veins, no DVT, no non-healing foot ulcer Neuro:no stroke, no TIA's, no seizures, no headaches, no temporary blindness one eye, no slurred speech, no peripheral neuropathy, no chronic pain, no instability of gait, no memory/cognitive dysfunction Musculoskeletal:no arthritis, no joint swelling, no myalgias, no difficulty  walking, normal mobility  Skin:no rash, no itching, no skin infections, no pressure sores or ulcerations Psych:no anxiety, no depression, no nervousness, no unusual recent stress Eyes:no blurry vision, no floaters, no recent vision changes, + wears glasses or contacts ENT:no hearing loss, no loose or painful teeth, no dentures, last saw dentist within 6 months Hematologic:+ easy bruising, no abnormal bleeding, no clotting disorder, no frequent epistaxis Endocrine:no diabetes, does not check CBG's at home   Physical Exam:  BP 126/92 mmHg  Pulse 106  Resp 16  Ht 5\' 7"  (1.702 m)  Wt 180 lb (81.647 kg)  BMI 28.19 kg/m2  SpO2 97%  LMP 12/25/2012 General:Obese but o/w well-appearing HEENT:Unremarkable  Neck:no JVD, no bruits, no adenopathy  Chest:clear to auscultation, symmetrical breath sounds, no wheezes, no rhonchi  [id number] rate and rhythm, no murmur  Abdomen:soft, non-tender, no masses  Extremities:warm, well-perfused, pulses diminished but palpable, no LE edema Rectal/GUDeferred Neuro:Grossly non-focal and symmetrical throughout Skin:Clean and dry, no rashes, no breakdown   Diagnostic Tests:  Transesophageal Echocardiography  Patient:  Doriane, Shreeves MR #:    [id number] Study Date: 02/06/2015 Gender:   F Age:    33 Height:   170.2 cm Weight:   87.7  kg BSA:    2.06 m^2 Pt. Status: Room:    2H26C  ADMITTING  Shelva Majestic, M.D. ATTENDING  Shelva Majestic, M.D. ORDERING   Kirk Ruths PERFORMING  Kirk Ruths REFERRING  Kirk Ruths SONOGRAPHER Roseanna Rainbow  cc:  ------------------------------------------------------------------- LV EF: 35% -  40%  ------------------------------------------------------------------- Indications:   Atrial fibrillation - 427.31.  ------------------------------------------------------------------- History:  PMH:  Atrial flutter.  ------------------------------------------------------------------- Study Conclusions  - Left ventricle: Systolic function was moderately reduced. The estimated ejection fraction was in the range of 35% to 40%. Diffuse hypokinesis. - Aortic valve: No evidence of vegetation. There was mild regurgitation. - Mitral valve: No evidence of vegetation. There was mild regurgitation. - Left atrium: The atrium was mildly dilated. There was a possible, mobilethrombusin the appendage. There was spontaneous echo contrast (&quot;smoke&quot;) in the cavity. - Right atrium: No evidence of thrombus in the atrial cavity or appendage. - Atrial septum: No defect or patent foramen ovale was identified. - Tricuspid valve: No evidence of vegetation. There was mild-moderate regurgitation. - Pulmonic valve: No evidence of vegetation. - Pericardium, extracardiac: A small pericardial effusion was identified.  Impressions:  - Technically difficult due to tachycardia (HR 140-150); definity used to assess LAA; noderate global reduction in LV function (EF difficult to quantitate due to tachycardia); mild LAE; spontaneous contrast in LA; possible thrombus at opening of LAA; mild MR and AI; mild to moderate  TR; suggest anticoagulation for four weeks and then proceed with DCCV.  Diagnostic transesophageal echocardiography. 2D and  color Doppler. Birthdate: Patient birthdate: [birth year]. Age: Patient is 55 yr old. Sex: Gender: female.  BMI: 30.3 kg/m^2. Blood pressure: 109/61 Patient status: Inpatient. Study date: Study date: 02/06/2015. Study time: 09:58 AM. Location: Endoscopy.  -------------------------------------------------------------------  ------------------------------------------------------------------- Left ventricle: Systolic function was moderately reduced. The estimated ejection fraction was in the range of 35% to 40%. Diffuse hypokinesis.  ------------------------------------------------------------------- Aortic valve:  Structurally normal valve.  Cusp separation was normal. No evidence of vegetation. Doppler: There was mild regurgitation.  ------------------------------------------------------------------- Aorta: Descending aorta: The descending aorta had mild diffuse disease.  ------------------------------------------------------------------- Mitral valve:  Structurally normal valve.  Leaflet separation was normal. No evidence of vegetation. Doppler: There was mild regurgitation.  ------------------------------------------------------------------- Left atrium: The atrium was mildly dilated. There was a possible, mobilethrombusin the appendage. There was spontaneous echo contrast (&quot;smoke&quot;) in the cavity.  ------------------------------------------------------------------- Atrial septum: No defect or patent foramen ovale was identified.  ------------------------------------------------------------------- Right ventricle: The cavity size was normal. Systolic function was normal.  ------------------------------------------------------------------- Pulmonic valve:  Structurally normal valve.  Cusp separation was normal. No evidence of vegetation. Doppler: There was  trivial regurgitation.  ------------------------------------------------------------------- Tricuspid valve:  Structurally normal valve.  Leaflet separation was normal. No evidence of vegetation. Doppler: There was mild-moderate regurgitation.  ------------------------------------------------------------------- Right atrium: The atrium was normal in size. No evidence of thrombus in the atrial cavity or appendage.  ------------------------------------------------------------------- Pericardium: A small pericardial effusion was identified.  ------------------------------------------------------------------- Prepared and Electronically Authenticated by  Kirk Ruths 2016-04-01T16:08:16   Transesophageal Echocardiography  Patient:  Isra, Mathew MR #:    [id number] Study Date: 05/14/2015 Gender:   F Age:    65 Height:   170.2 cm Weight:   85.3 kg BSA:    2.03 m^2 Pt. Status: Room:  ADMITTING  Sanda Klein, MD PERFORMING  Sanda Klein, MD SONOGRAPHER [facility] ATTENDING  Lynnell Jude, Amber K Emelia Loron, Amber K REFERRING  Lynnell Jude, Amber K  cc:  ------------------------------------------------------------------- LV EF: 55% -  60%  ------------------------------------------------------------------- Indications:   Pre-op evaluation V728.1.  ------------------------------------------------------------------- History:  PMH: LAA mass.  ------------------------------------------------------------------- Study Conclusions  - Left ventricle: Systolic function was normal. The estimated ejection fraction was in the range of 55% to 60%. Wall motion was normal; there were no regional wall motion abnormalities. - Left atrium: The atrium was mildly dilated. There was a small, 1.0 cm (L) x 0.8 cm (W), irregular, hyperechoic, mobilemassin the atrial cavity or appendage; based on its appearance, myxoma  or fibroelastoma cannot be excluded. - Right atrium: No evidence of thrombus in the atrial cavity or appendage. - Pericardium, extracardiac: A trivial pericardial effusion was identified.  Impressions:  - The previously described &quot;thrombus&quot; from February 06, 2015 is still seen in the left atrial appendage, despite restoration of sinus rhythm, excellent appendage contraction and 3 months of effective anticoagulation. It is visualized in more detail today at a much slower heart rate. It has features that raise the likelihood of a cardiac benign tumor, rather than a thrombus.  Diagnostic transesophageal echocardiography. 2D and color Doppler. Birthdate: Patient birthdate: 1961/07/31. Age: Patient is 55 yr old. Sex: Gender: female.  BMI: 29.4 kg/m^2. Blood pressure: 107/65 Patient status: Inpatient. Study date: Study date: 05/14/2015. Study time: 08:04 AM. Location: Endoscopy.  -------------------------------------------------------------------  ------------------------------------------------------------------- Left ventricle: Systolic function was normal. The estimated ejection fraction was in the range of 55% to 60%. Wall motion was normal; there were  no regional wall motion abnormalities.  ------------------------------------------------------------------- Aortic valve:  Structurally normal valve. Trileaflet; normal thickness leaflets. Cusp separation was normal. Doppler: There was no significant regurgitation.  ------------------------------------------------------------------- Aorta: The aorta was normal, not dilated, and non-diseased. There was no atheroma. There was no evidence for dissection. Aortic root: The aortic root was not dilated. Ascending aorta: The ascending aorta was normal in size. Aortic arch: The aortic arch was normal in size. Descending aorta: The descending aorta was normal in  size.  ------------------------------------------------------------------- Mitral valve:  Structurally normal valve.  Leaflet separation was normal. Doppler: There was no significant regurgitation.  ------------------------------------------------------------------- Left atrium: The atrium was mildly dilated. There was a small, 1.0 cm (L) x 0.8 cm (W), irregular, hyperechoic, mobilemassin the atrial cavity or appendage; based on its appearance, myxoma or fibroelastoma cannot be excluded. The mass is attached to the lateral aspect of the base of the appendage. The appendage was morphologically a left appendage and of normal size. Emptying velocity was normal.  ------------------------------------------------------------------- Right ventricle: The cavity size was normal. Wall thickness was normal. Systolic function was normal.  ------------------------------------------------------------------- Pulmonic valve:  Structurally normal valve.  ------------------------------------------------------------------- Tricuspid valve:  Structurally normal valve.  Leaflet separation was normal. Doppler: There was mild regurgitation.  ------------------------------------------------------------------- Pulmonary artery:  The main pulmonary artery was normal-sized.  ------------------------------------------------------------------- Right atrium: The atrium was normal in size. No evidence of thrombus in the atrial cavity or appendage. The appendage was morphologically a right appendage.  ------------------------------------------------------------------- Pericardium: A trivial pericardial effusion was identified.  ------------------------------------------------------------------- Post procedure conclusions Ascending Aorta:  - The aorta was normal, not dilated, and non-diseased.  ------------------------------------------------------------------- Measurements  Tricuspid  valve           Value Tricuspid regurg peak velocity    253  cm/s Tricuspid peak RV-RA gradient    26  mm Hg  Legend: (L) and (H) mark values outside specified reference range.  ------------------------------------------------------------------- Prepared and Electronically Authenticated by  Candee Furbish, M.D. 2016-07-07T13:00:23      ------------------------------------------------------------------- Transesophageal Echocardiography  (Report amended )  Patient:  Rhyan, Lope MR #:    [id number] Study Date: 05/14/2015 Gender:   F Age:    67 Height:   170.2 cm Weight:   85.3 kg BSA:    2.03 m^2 Pt. Status: Room:  ADMITTING  Sanda Klein, MD PERFORMING  Sanda Klein, MD SONOGRAPHER [facility] ATTENDING  Lynnell Jude, Amber K Emelia Loron, Amber K REFERRING  Lynnell Jude, Amber K  cc:  ------------------------------------------------------------------- LV EF: 55% -  60%  ------------------------------------------------------------------- Indications:   Pre-op evaluation V728.1.  ------------------------------------------------------------------- History:  PMH: LAA mass.  ------------------------------------------------------------------- Study Conclusions  - Left ventricle: Systolic function was normal. The estimated ejection fraction was in the range of 55% to 60%. Wall motion was normal; there were no regional wall motion abnormalities. - Left atrium: The atrium was mildly dilated. There was a small, 1.0 cm (L) x 0.8 cm (W), irregular, hyperechoic, mobilemassin the atrial cavity or appendage; based on its appearance, myxoma or fibroelastoma cannot be excluded. - Right atrium: No evidence of thrombus in the atrial cavity or appendage. - Pericardium, extracardiac: A trivial pericardial effusion was identified.  Impressions:  - The previously described &quot;thrombus&quot;  from February 06, 2015 is still seen in the left atrial appendage, despite restoration of sinus rhythm, excellent appendage contraction and 3 months of effective anticoagulation. It is visualized in more detail today at a much slower heart rate. It has features that raise the likelihood of a  cardiac benign tumor, rather than a thrombus.  Diagnostic transesophageal echocardiography. 2D and color Doppler. Birthdate: Patient birthdate: [birth year]. Age: Patient is 55 yr old. Sex: Gender: female.  BMI: 29.4 kg/m^2. Blood pressure: 107/65 Patient status: Inpatient. Study date: Study date: 05/14/2015. Study time: 08:04 AM. Location: Endoscopy.  -------------------------------------------------------------------  ------------------------------------------------------------------- Left ventricle: Systolic function was normal. The estimated ejection fraction was in the range of 55% to 60%. Wall motion was normal; there were no regional wall motion abnormalities.  ------------------------------------------------------------------- Aortic valve:  Structurally normal valve. Trileaflet; normal thickness leaflets. Cusp separation was normal. Doppler: There was no significant regurgitation.  ------------------------------------------------------------------- Aorta: The aorta was normal, not dilated, and non-diseased. There was no atheroma. There was no evidence for dissection. Aortic root: The aortic root was not dilated. Ascending aorta: The ascending aorta was normal in size. Aortic arch: The aortic arch was normal in size. Descending aorta: The descending aorta was normal in size.  ------------------------------------------------------------------- Mitral valve:  Structurally normal valve.  Leaflet separation was normal. Doppler: There was no significant regurgitation.  ------------------------------------------------------------------- Left atrium: The  atrium was mildly dilated. There was a small, 1.0 cm (L) x 0.8 cm (W), irregular, hyperechoic, mobilemassin the atrial cavity or appendage; based on its appearance, myxoma or fibroelastoma cannot be excluded. The mass is attached to the lateral aspect of the base of the appendage. The appendage was morphologically a left appendage and of normal size. Emptying velocity was normal.  ------------------------------------------------------------------- Right ventricle: The cavity size was normal. Wall thickness was normal. Systolic function was normal.  ------------------------------------------------------------------- Pulmonic valve:  Structurally normal valve.  ------------------------------------------------------------------- Tricuspid valve:  Structurally normal valve.  Leaflet separation was normal. Doppler: There was mild regurgitation.  ------------------------------------------------------------------- Pulmonary artery:  The main pulmonary artery was normal-sized.  ------------------------------------------------------------------- Right atrium: The atrium was normal in size. No evidence of thrombus in the atrial cavity or appendage. The appendage was morphologically a right appendage.  ------------------------------------------------------------------- Pericardium: A trivial pericardial effusion was identified.  ------------------------------------------------------------------- Post procedure conclusions Ascending Aorta:  - The aorta was normal, not dilated, and non-diseased.  ------------------------------------------------------------------- Measurements  Tricuspid valve           Value Tricuspid regurg peak velocity    253  cm/s Tricuspid peak RV-RA gradient    26  mm Hg  Legend: (L) and (H) mark values outside specified reference range.  ------------------------------------------------------------------- Renaldo Harrison, MD 2016-07-07T13:02:38    Transesophageal Echocardiography  Patient:  Francisquita, Lysne MR #:    [id number] Study Date: 09/09/2015 Gender:   F Age:    40 Height:   170.2 cm Weight:   85.5 kg BSA:    2.03 m^2 Pt. Status: Room:    [place]  Sherran Needs SONOGRAPHER Jimmy Reel, RDCS  cc:  ------------------------------------------------------------------- LV EF: 55% -  60%  ------------------------------------------------------------------- Indications:   Atrial fibrillation - 427.31.  ------------------------------------------------------------------- Study Conclusions  - Left ventricle: Systolic function was normal. The estimated ejection fraction was in the range of 55% to 60%. Wall motion was normal; there were no regional wall motion abnormalities. - Aortic valve: No evidence of vegetation. There was trivial regurgitation. - Mitral valve: No evidence of vegetation. - Right atrium: No evidence of thrombus in the atrial cavity or appendage. - Atrial septum: No defect or patent foramen ovale was identified. - Tricuspid valve: No evidence of vegetation. - Pulmonic valve: No evidence  of vegetation.  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  Diagnostic transesophageal echocardiography. 2D and color Doppler. Birthdate: Patient birthdate: 09-03-1961. Age: Patient is 55 yr old. Sex: Gender: female.  BMI: 29.5 kg/m^2. Blood pressure: 102/68 Patient status: Outpatient. Study date: Study date: 09/09/2015. Study time: 01:00 PM. Location: Endoscopy.  -------------------------------------------------------------------  ------------------------------------------------------------------- Left  ventricle: Systolic function was normal. The estimated ejection fraction was in the range of 55% to 60%. Wall motion was normal; there were no regional wall motion abnormalities.  ------------------------------------------------------------------- Aortic valve:  Structurally normal valve.  Cusp separation was normal. No evidence of vegetation. Doppler: There was trivial regurgitation.  ------------------------------------------------------------------- Aorta: Descending aorta: The descending aorta had mild diffuse disease.  ------------------------------------------------------------------- Mitral valve:  Structurally normal valve.  Leaflet separation was normal. No evidence of vegetation. Doppler: There was trivial regurgitation.  ------------------------------------------------------------------- Left atrium: The atrium was normal in size.  ------------------------------------------------------------------- Atrial septum: No defect or patent foramen ovale was identified.  ------------------------------------------------------------------- Right ventricle: The cavity size was normal. Systolic function was normal.  ------------------------------------------------------------------- Pulmonic valve:  Structurally normal valve.  Cusp separation was normal. No evidence of vegetation. Doppler: There was trivial regurgitation.  ------------------------------------------------------------------- Tricuspid valve:  Structurally normal valve.  Leaflet separation was normal. No evidence of vegetation. Doppler: There was mild regurgitation.  ------------------------------------------------------------------- Right atrium: The atrium was normal in size. No evidence of thrombus in the atrial cavity or appendage.  ------------------------------------------------------------------- Pericardium: There was no pericardial  effusion.  ------------------------------------------------------------------- Prepared and Electronically Authenticated by  Kirk Ruths 2016-11-02T17:38:18    CARDIAC CATHETERIZATION   History obtained from chart review. 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 She has paroxysmal A. Fib on Eliquis. She is scheduled for surgical resection of this in the upcoming future. She presents for coronary angiography to define her coronary anatomy prior to her open heart procedure.  IMPRESSION:Mrs. Maran has normal coronary arteries. Her procedure was performed radially. The sheath was removed and a TR band was placed on the right wrist to achieve patent hemostasis. The patient left the lab in stable condition. She'll be discharged home today as an outpatient and will follow-up with Dr. Roxy Manns for her planned surgical resection of her left atrial mass.  Quay Burow. MD, [facility] 11/12/2015 12:14 PM     Indications    Atrial mass [R22.2 (ICD-10-CM)]    Technique and Indications    PROCEDURE DESCRIPTION:   The patient was brought to the second floor [facility] cath lab in the postabsorptive state. She was premedicated with Valium 5 mg by mouth, IV Versed and fentanyl. Her right wristwas prepped and shaved in usual sterile fashion. Xylocaine 1% was used for local anesthesia. A 6 French sheath was inserted into the right radial artery using standard Seldinger technique. 5 [place] TIG catheter was used for selective coronary angiography. Left ventriculography was not performed. Visipaque dye was used for the entirety of the case. Retrograde aortic pressure was monitored during the case. The patient did receive 4000 units of IV heparin as well as radial cocktail. The SideArm sheath.Estimated blood loss <50 mL. There were no immediate complications during the procedure.    Coronary Findings    Dominance: Right     Coronary Diagrams    Diagnostic Diagram            Implants    Name ID Temporary Type Supply  No information to display    PACS Images    Show images for Cardiac catheterization     Link to Procedure Log    Procedure Log      Hemo Data       Most Recent Value   AO Systolic Pressure  [id number] mmHg   AO Diastolic Pressure  76 mmHg   AO Mean  96 mmHg      CT ANGIOGRAPHY CHEST, ABDOMEN AND PELVIS  TECHNIQUE: Multidetector CT imaging through the chest, abdomen and pelvis was performed using the standard protocol during bolus administration of intravenous contrast. Multiplanar reconstructed images and MIPs were obtained and reviewed to evaluate the vascular anatomy.  CONTRAST: 75 mL Isovue 370 administered intravenously  COMPARISON: Prior CT of the abdomen and pelvis 10/27/2015 ; prior CT scan of the chest 04/02/2012  FINDINGS: CTA CHEST FINDINGS  Mediastinum: Multiple bilateral thyroid nodules measuring up to 13 mm on the right (hypervascular with internal dystrophic calcifications) and 11 mm on the left (hypo attenuating with internal dystrophic calcifications). Both lesions appear minimally changed compared to prior CT scan of the chest dated 04/02/2012. No dedicated follow-up imaging required for incidentally detected in nodules less than 15 mm. This follows ACR consensus guidelines: Managing Incidental Thyroid Nodules Detected on Imaging: White Paper of the ACR Incidental Thyroid Findings Committee. J Am Coll Radiol 2015; 12:143-150. Remainder of the thoracic inlet is unremarkable. No suspicious adenopathy. No suspicious mediastinal or hilar adenopathy. The thoracic esophagus is unremarkable.  Heart/Vascular: Conventional 3 vessel aortic arch anatomy. Mild scattered atherosclerotic vascular calcifications without significant stenosis. No evidence of aortic aneurysm or dissection. The maximal  diameter of the ascending thoracic aorta is 3.5 cm. The heart is within normal limits for size. No pericardial effusion. No visible coronary artery calcifications within the limitations of non cardiac gated technique. Normal size main and central pulmonary arteries. No central pulmonary embolus. Mildly variant pulmonary venous anatomy. There is a right middle pulmonary vein resulting in 3 right-sided pulmonary venous ostia.  Lungs/Pleura: The lungs are clear save for trace dependent atelectasis. No significant emphysema, scarring or bronchial wall thickening. Compared to the prior CT scan of the chest from 04/02/2012 there are 2 new nodular opacities in the right middle lobe. The first measures 6 mm (image 36 series 5) with faint surrounding tree-in-bud micro nodularity. The second is more linear in configuration measuring up to 1.3 cm 13 mm in length but only 4 mm in width. A small 2- 3 mm nodule in the right lower lobe (image 29 series 5) remains unchanged and is therefore benign given 2 year stability.  Bones/Soft Tissues: No acute fracture or aggressive appearing lytic or blastic osseous lesion.  Review of the MIP images confirms the above findings.  CTA ABDOMEN AND PELVIS FINDINGS  VASCULAR  Aorta: Normal caliber abdominal aorta. No aneurysm, ectasia or dissection. Trace atherosclerotic calcifications in the infrarenal segment.  Celiac: Widely patent.  SMA: Widely patent. The right hepatic artery is replaced to the SMA.  Renals: Bilateral solitary renal arteries without evidence of stenosis or fibromuscular dysplasia.  IMA: Patent and unremarkable.  Inflow: Mild atherosclerotic calcifications without evidence of stenosis, aneurysm or dissection.  Proximal Outflow: Mild calcified plaque along the posterior wall of the right common femoral artery. Otherwise, the visualized vessels are unremarkable bilaterally.  Veins: No focal venous  abnormality.  NON-VASCULAR  Abdomen: Unremarkable CT appearance of the stomach, duodenum, adrenal glands and pancreas. Normal hepatic contour morphology. No discrete hepatic lesion. Gallbladder is  unremarkable. No intra or extrahepatic biliary ductal dilatation.  No evidence of hydronephrosis or nephrolithiasis. No enhancing renal mass. 5.8 cm water attenuation simple cyst exophytic from the upper pole of the right kidney. 4.6 cm simple cyst exophytic from the lower pole of the right kidney. Small 9 mm low-attenuation lesion arising medially from the lower pole of the right kidney is too small for accurate characterization. Statistically, this is favored to represent a benign cyst. Too small to characterize 7 mm low-attenuation lesion in the left interpolar kidney is also likely a benign cyst. There is focal renal cortical volume loss along the medial aspect of the kidney at the junction of the upper and interpolar region as well as several smaller areas of focal renal cortical thinning laterally and posteriorly. Findings likely reflect the sequelae of prior pyelonephritis or less likely remote infarcts.  No evidence of bowel obstruction or focal bowel wall thickening. The appendix is either completely decompressed or surgically absent. No suspicious adenopathy or free fluid.  Pelvis: Unremarkable appearance of the at adnexa, uterus and bladder. No free fluid or suspicious adenopathy.  Bones/Soft Tissues: No acute fracture or aggressive appearing lytic or blastic osseous lesion. Surgical changes of right L4 -L5 left hemi posterior lumbar interbody fusion with right L4 laminectomy defect. There is an interbody graft. L3-L4 facet arthropathy. Mild L5-S1 degenerative disc disease.  Review of the MIP images confirms the above findings.  IMPRESSION: VASCULAR  1. No evidence of aortic aneurysm, dissection or other acute abnormality. 2. Mild scattered atherosclerotic  vascular calcifications without evidence of significant stenosis. 3. Right hepatic artery is replaced to the SMA. 4. Mild variant anatomy of the pulmonary veins with a right middle pulmonary vein resulting in 3 right-sided pulmonary venous ostia. NON VASCULAR  1. No acute abnormality in the abdomen or pelvis. 2. Simple cyst exophytic from the upper and lower poles of the right kidney. Additional sub cm low-attenuation renal lesions bilaterally are too small to characterize but also statistically likely benign cysts. 3. Multifocal regions of renal cortical scarring on the right which may be the sequelae of prior infectious, inflammatory or ischemic episodes. Given the distribution, prior reflux nephropathy is considered unlikely. 4. Lower lumbar degenerative disc disease and facet arthropathy with surgical changes of prior right L4 laminectomy and left L4-L5 hemi posterior lumbar interbody fusion.  Signed,  Criselda Peaches, MD  [facility]  [facility]  Electronically Signed: By: Jacqulynn Cadet M.D. On: 11/11/2015 10:48 ADDENDUM REPORT: 11/11/2015 13:34 ADDENDUM: Please note that the following findings which were listed in the body of the report should also have been included in the impression. IMPRESSION: IMPRESSION New nonspecific nodules in the right middle lobe measuring up to 6 mm in diameter. There is a suggestion of adjacent tree-in-bud micro nodularity and therefore this is favored to represent either bronchial impaction, or an infectious/inflammatory process. Follow-up imaging is recommended to ensure stability/resolution and exclude a malignant process. If the patient is at high risk for bronchogenic carcinoma, follow-up chest CT at 6-12 months is recommended. If the patient is at low risk for bronchogenic carcinoma, follow-up chest CT at 12 months is recommended. This recommendation follows the  consensus statement: Guidelines for Management of Small Pulmonary Nodules Detected on CT Scans: A Statement from the [employer] as published in Radiology 2005;237:395-400. These results will be called to the ordering clinician or representative by the Radiologist Assistant, and communication documented in the PACS or zVision Dashboard. Electronically Signed  By: Myrle Sheng  Laurence Ferrari M.D.  On: 11/11/2015 13:34          Impression:  Patient has a small mass adherent to the endocardial surface of the left atrium along the superior ribs of the left atrial appendage adjacent to the orifice of the left inferior pulmonary vein. I have personally reviewed the patient's previous transesophageal echocardiograms. 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 The mass has persisted despite several months of continuous anticoagulation. I have personally reviewed the patient's recent diagnostic cardiac catheterization and CT angiogram. She does not have any significant coronary artery disease and she appears to be a reasonably good candidate for minimally invasive approach for surgery.   Plan:  I discussed the indications, risks, and potential benefits of surgical resection of the small left atrial mass with the patient in the office today. Alternative treatment strategies been discussed including concerns regarding the long-term risk of embolism and stroke. The patient understands and accepts all potential associated risks of surgery including but not limited to risk of death, stroke, myocardial infarction, congestive heart failure, respiratory failure, renal failure, bleeding requiring blood transfusion and/or reexploration, arrhythmia, heart block or bradycardia requiring permanent pacemaker, pneumonia, pleural effusion, wound infection, pulmonary embolus or other thromboembolic complication, chronic pain or other delayed complications.  The relative risks and benefits of performing a maze procedure at the time of her surgery was discussed at length, including the expected likelihood of long term freedom from recurrent symptomatic atrial fibrillation and/or atrial flutter. Alternative surgical approaches have been discussed including a comparison between conventional sternotomy and minimally-invasive techniques. The relative risks and benefits of each have been reviewed as they pertain to the patient's specific circumstances, and all of their questions have been addressed. Specific risks potentially related to the minimally-invasive approach were discussed at length, including but not limited to risk of conversion to full or partial sternotomy, aortic dissection or other major vascular complication, unilateral acute lung injury or pulmonary edema, phrenic nerve dysfunction or paralysis, rib fracture, chronic pain, lung hernia, or lymphocele.   We plan to proceed with surgery on Wednesday, 12/16/2015. The patient has been instructed to stop taking Eliquis 7 days prior to surgery. All of her questions have been addressed.      Valentina Gu. Roxy Manns, MD 11/16/2015 4:16 PM

## 2015-12-16 ENCOUNTER — Encounter (HOSPITAL_COMMUNITY)
Admission: RE | Disposition: A | Discharge status: Home / Self Care | Payer: Self-pay | Source: Ambulatory Visit | Attending: Thoracic Surgery (Cardiothoracic Vascular Surgery)

## 2015-12-16 ENCOUNTER — Inpatient Hospital Stay (HOSPITAL_COMMUNITY): Payer: BLUE CROSS/BLUE SHIELD

## 2015-12-16 ENCOUNTER — Encounter (HOSPITAL_COMMUNITY): Payer: Self-pay | Admitting: Certified Registered Nurse Anesthetist

## 2015-12-16 ENCOUNTER — Inpatient Hospital Stay (HOSPITAL_COMMUNITY): Payer: BLUE CROSS/BLUE SHIELD | Admitting: Certified Registered"

## 2015-12-16 ENCOUNTER — Inpatient Hospital Stay (HOSPITAL_COMMUNITY)
Admission: RE | Admit: 2015-12-16 | Discharge: 2015-12-22 | DRG: 229 | Disposition: A | Discharge status: Home / Self Care | Payer: BLUE CROSS/BLUE SHIELD | Source: Ambulatory Visit | Attending: Thoracic Surgery (Cardiothoracic Vascular Surgery) | Admitting: Thoracic Surgery (Cardiothoracic Vascular Surgery)

## 2015-12-16 DIAGNOSIS — K5900 Constipation, unspecified: Secondary | ICD-10-CM | POA: Diagnosis not present

## 2015-12-16 DIAGNOSIS — M5136 Other intervertebral disc degeneration, lumbar region: Secondary | ICD-10-CM | POA: Diagnosis present

## 2015-12-16 DIAGNOSIS — I513 Intracardiac thrombosis, not elsewhere classified: Secondary | ICD-10-CM | POA: Diagnosis present

## 2015-12-16 DIAGNOSIS — J939 Pneumothorax, unspecified: Secondary | ICD-10-CM

## 2015-12-16 DIAGNOSIS — I5032 Chronic diastolic (congestive) heart failure: Secondary | ICD-10-CM | POA: Diagnosis present

## 2015-12-16 DIAGNOSIS — I4892 Unspecified atrial flutter: Secondary | ICD-10-CM | POA: Diagnosis present

## 2015-12-16 DIAGNOSIS — I480 Paroxysmal atrial fibrillation: Secondary | ICD-10-CM | POA: Diagnosis present

## 2015-12-16 DIAGNOSIS — D696 Thrombocytopenia, unspecified: Secondary | ICD-10-CM | POA: Diagnosis present

## 2015-12-16 DIAGNOSIS — D151 Benign neoplasm of heart: Secondary | ICD-10-CM | POA: Diagnosis present

## 2015-12-16 DIAGNOSIS — I483 Typical atrial flutter: Secondary | ICD-10-CM | POA: Diagnosis present

## 2015-12-16 DIAGNOSIS — Z87891 Personal history of nicotine dependence: Secondary | ICD-10-CM

## 2015-12-16 DIAGNOSIS — D62 Acute posthemorrhagic anemia: Secondary | ICD-10-CM | POA: Diagnosis not present

## 2015-12-16 DIAGNOSIS — I083 Combined rheumatic disorders of mitral, aortic and tricuspid valves: Secondary | ICD-10-CM | POA: Diagnosis present

## 2015-12-16 DIAGNOSIS — Z9889 Other specified postprocedural states: Secondary | ICD-10-CM

## 2015-12-16 DIAGNOSIS — J9811 Atelectasis: Secondary | ICD-10-CM

## 2015-12-16 DIAGNOSIS — Z981 Arthrodesis status: Secondary | ICD-10-CM | POA: Diagnosis not present

## 2015-12-16 DIAGNOSIS — I481 Persistent atrial fibrillation: Secondary | ICD-10-CM | POA: Diagnosis present

## 2015-12-16 DIAGNOSIS — I519 Heart disease, unspecified: Secondary | ICD-10-CM | POA: Diagnosis not present

## 2015-12-16 DIAGNOSIS — I5022 Chronic systolic (congestive) heart failure: Secondary | ICD-10-CM | POA: Diagnosis present

## 2015-12-16 DIAGNOSIS — Z7901 Long term (current) use of anticoagulants: Secondary | ICD-10-CM

## 2015-12-16 DIAGNOSIS — M4696 Unspecified inflammatory spondylopathy, lumbar region: Secondary | ICD-10-CM | POA: Diagnosis present

## 2015-12-16 DIAGNOSIS — Z8679 Personal history of other diseases of the circulatory system: Secondary | ICD-10-CM

## 2015-12-16 DIAGNOSIS — I4891 Unspecified atrial fibrillation: Secondary | ICD-10-CM

## 2015-12-16 DIAGNOSIS — I5189 Other ill-defined heart diseases: Secondary | ICD-10-CM | POA: Diagnosis present

## 2015-12-16 HISTORY — DX: Personal history of other diseases of the circulatory system: Z86.79

## 2015-12-16 HISTORY — PX: MINIMALLY INVASIVE EXCISION OF ATRIAL MYXOMA: SHX5974

## 2015-12-16 HISTORY — DX: Benign neoplasm of heart: D15.1

## 2015-12-16 HISTORY — PX: CLIPPING OF ATRIAL APPENDAGE: SHX5773

## 2015-12-16 HISTORY — PX: MINIMALLY INVASIVE MAZE PROCEDURE: SHX6244

## 2015-12-16 HISTORY — DX: Other specified postprocedural states: Z98.890

## 2015-12-16 HISTORY — PX: TEE WITHOUT CARDIOVERSION: SHX5443

## 2015-12-16 LAB — POCT I-STAT, CHEM 8
BUN: 18 mg/dL (ref 6–20)
BUN: 15 mg/dL (ref 6–20)
BUN: 17 mg/dL (ref 6–20)
BUN: 16 mg/dL (ref 6–20)
BUN: 17 mg/dL (ref 6–20)
BUN: 17 mg/dL (ref 6–20)
BUN: 15 mg/dL (ref 6–20)
CALCIUM ION: 1.1 mmol/L — AB (ref 1.12–1.23)
CALCIUM ION: 1.14 mmol/L (ref 1.12–1.23)
CALCIUM ION: 1.2 mmol/L (ref 1.12–1.23)
CHLORIDE: 105 mmol/L (ref 101–111)
CHLORIDE: 100 mmol/L — AB (ref 101–111)
CHLORIDE: 104 mmol/L (ref 101–111)
CHLORIDE: 101 mmol/L (ref 101–111)
CHLORIDE: 109 mmol/L (ref 101–111)
CREATININE: 0.6 mg/dL (ref 0.44–1.00)
CREATININE: 0.6 mg/dL (ref 0.44–1.00)
CREATININE: 0.6 mg/dL (ref 0.44–1.00)
Calcium, Ion: 1.26 mmol/L — ABNORMAL HIGH (ref 1.12–1.23)
Calcium, Ion: 1.05 mmol/L — ABNORMAL LOW (ref 1.12–1.23)
Calcium, Ion: 1.25 mmol/L — ABNORMAL HIGH (ref 1.12–1.23)
Calcium, Ion: 1.13 mmol/L (ref 1.12–1.23)
Chloride: 103 mmol/L (ref 101–111)
Chloride: 105 mmol/L (ref 101–111)
Creatinine, Ser: 0.7 mg/dL (ref 0.44–1.00)
Creatinine, Ser: 0.7 mg/dL (ref 0.44–1.00)
Creatinine, Ser: 0.7 mg/dL (ref 0.44–1.00)
Creatinine, Ser: 0.7 mg/dL (ref 0.44–1.00)
GLUCOSE: 152 mg/dL — AB (ref 65–99)
GLUCOSE: 155 mg/dL — AB (ref 65–99)
GLUCOSE: 139 mg/dL — AB (ref 65–99)
GLUCOSE: 101 mg/dL — AB (ref 65–99)
Glucose, Bld: 99 mg/dL (ref 65–99)
Glucose, Bld: 101 mg/dL — ABNORMAL HIGH (ref 65–99)
Glucose, Bld: 108 mg/dL — ABNORMAL HIGH (ref 65–99)
HCT: 31 % — ABNORMAL LOW (ref 36.0–46.0)
HCT: 24 % — ABNORMAL LOW (ref 36.0–46.0)
HCT: 24 % — ABNORMAL LOW (ref 36.0–46.0)
HCT: 32 % — ABNORMAL LOW (ref 36.0–46.0)
HEMATOCRIT: 34 % — AB (ref 36.0–46.0)
HEMATOCRIT: 23 % — AB (ref 36.0–46.0)
HEMATOCRIT: 25 % — AB (ref 36.0–46.0)
HEMOGLOBIN: 11.6 g/dL — AB (ref 12.0–15.0)
HEMOGLOBIN: 8.2 g/dL — AB (ref 12.0–15.0)
Hemoglobin: 10.5 g/dL — ABNORMAL LOW (ref 12.0–15.0)
Hemoglobin: 7.8 g/dL — ABNORMAL LOW (ref 12.0–15.0)
Hemoglobin: 8.5 g/dL — ABNORMAL LOW (ref 12.0–15.0)
Hemoglobin: 8.2 g/dL — ABNORMAL LOW (ref 12.0–15.0)
Hemoglobin: 10.9 g/dL — ABNORMAL LOW (ref 12.0–15.0)
POTASSIUM: 3.7 mmol/L (ref 3.5–5.1)
POTASSIUM: 3.6 mmol/L (ref 3.5–5.1)
POTASSIUM: 3.7 mmol/L (ref 3.5–5.1)
POTASSIUM: 4.5 mmol/L (ref 3.5–5.1)
POTASSIUM: 4.8 mmol/L (ref 3.5–5.1)
Potassium: 4.2 mmol/L (ref 3.5–5.1)
Potassium: 4.2 mmol/L (ref 3.5–5.1)
SODIUM: 137 mmol/L (ref 135–145)
SODIUM: 140 mmol/L (ref 135–145)
SODIUM: 140 mmol/L (ref 135–145)
Sodium: 141 mmol/L (ref 135–145)
Sodium: 135 mmol/L (ref 135–145)
Sodium: 138 mmol/L (ref 135–145)
Sodium: 140 mmol/L (ref 135–145)
TCO2: 28 mmol/L (ref 0–100)
TCO2: 24 mmol/L (ref 0–100)
TCO2: 26 mmol/L (ref 0–100)
TCO2: 27 mmol/L (ref 0–100)
TCO2: 28 mmol/L (ref 0–100)
TCO2: 25 mmol/L (ref 0–100)
TCO2: 23 mmol/L (ref 0–100)

## 2015-12-16 LAB — GLUCOSE, CAPILLARY
GLUCOSE-CAPILLARY: 119 mg/dL — AB (ref 65–99)
GLUCOSE-CAPILLARY: 100 mg/dL — AB (ref 65–99)
GLUCOSE-CAPILLARY: 107 mg/dL — AB (ref 65–99)
Glucose-Capillary: 81 mg/dL (ref 65–99)
Glucose-Capillary: 122 mg/dL — ABNORMAL HIGH (ref 65–99)

## 2015-12-16 LAB — HEMOGLOBIN AND HEMATOCRIT, BLOOD
HEMATOCRIT: 24.6 % — AB (ref 36.0–46.0)
Hemoglobin: 7.9 g/dL — ABNORMAL LOW (ref 12.0–15.0)

## 2015-12-16 LAB — CBC
HCT: 32.6 % — ABNORMAL LOW (ref 36.0–46.0)
HEMATOCRIT: 31.6 % — AB (ref 36.0–46.0)
Hemoglobin: 10.1 g/dL — ABNORMAL LOW (ref 12.0–15.0)
Hemoglobin: 10.3 g/dL — ABNORMAL LOW (ref 12.0–15.0)
MCH: 27 pg (ref 26.0–34.0)
MCH: 26.8 pg (ref 26.0–34.0)
MCHC: 32 g/dL (ref 30.0–36.0)
MCHC: 31.6 g/dL (ref 30.0–36.0)
MCV: 84.5 fL (ref 78.0–100.0)
MCV: 84.9 fL (ref 78.0–100.0)
PLATELETS: 99 10*3/uL — AB (ref 150–400)
PLATELETS: 97 10*3/uL — AB (ref 150–400)
RBC: 3.74 MIL/uL — AB (ref 3.87–5.11)
RBC: 3.84 MIL/uL — AB (ref 3.87–5.11)
RDW: 13.7 % (ref 11.5–15.5)
RDW: 13.8 % (ref 11.5–15.5)
WBC: 9.6 10*3/uL (ref 4.0–10.5)
WBC: 10 10*3/uL (ref 4.0–10.5)

## 2015-12-16 LAB — CREATININE, SERUM: Creatinine, Ser: 0.84 mg/dL (ref 0.44–1.00)

## 2015-12-16 LAB — POCT I-STAT 3, ART BLOOD GAS (G3+)
ACID-BASE DEFICIT: 2 mmol/L (ref 0.0–2.0)
Acid-base deficit: 3 mmol/L — ABNORMAL HIGH (ref 0.0–2.0)
BICARBONATE: 24.6 meq/L — AB (ref 20.0–24.0)
Bicarbonate: 24.1 mEq/L — ABNORMAL HIGH (ref 20.0–24.0)
Bicarbonate: 22.1 mEq/L (ref 20.0–24.0)
O2 SAT: 100 %
O2 SAT: 100 %
O2 SAT: 97 %
PCO2 ART: 38.7 mmHg (ref 35.0–45.0)
PH ART: 7.241 — AB (ref 7.350–7.450)
PO2 ART: 349 mmHg — AB (ref 80.0–100.0)
TCO2: 26 mmol/L (ref 0–100)
TCO2: 26 mmol/L (ref 0–100)
TCO2: 23 mmol/L (ref 0–100)
pCO2 arterial: 56.1 mmHg — ABNORMAL HIGH (ref 35.0–45.0)
pCO2 arterial: 31.7 mmHg — ABNORMAL LOW (ref 35.0–45.0)
pH, Arterial: 7.412 (ref 7.350–7.450)
pH, Arterial: 7.441 (ref 7.350–7.450)
pO2, Arterial: 241 mmHg — ABNORMAL HIGH (ref 80.0–100.0)
pO2, Arterial: 80 mmHg (ref 80.0–100.0)

## 2015-12-16 LAB — POCT I-STAT 4, (NA,K, GLUC, HGB,HCT)
GLUCOSE: 62 mg/dL — AB (ref 65–99)
HEMATOCRIT: 32 % — AB (ref 36.0–46.0)
HEMOGLOBIN: 10.9 g/dL — AB (ref 12.0–15.0)
POTASSIUM: 3.6 mmol/L (ref 3.5–5.1)
SODIUM: 141 mmol/L (ref 135–145)

## 2015-12-16 LAB — MAGNESIUM: MAGNESIUM: 2.8 mg/dL — AB (ref 1.7–2.4)

## 2015-12-16 LAB — PLATELET COUNT: PLATELETS: 123 10*3/uL — AB (ref 150–400)

## 2015-12-16 LAB — PROTIME-INR
INR: 1.38 (ref 0.00–1.49)
Prothrombin Time: 17.1 seconds — ABNORMAL HIGH (ref 11.6–15.2)

## 2015-12-16 LAB — APTT: APTT: 29 s (ref 24–37)

## 2015-12-16 SURGERY — EXCISION, MYXOMA, CARDIAC ATRIUM, MINIMALLY INVASIVE
Anesthesia: General | Site: Chest | Laterality: Right

## 2015-12-16 MED ORDER — OXYCODONE HCL 5 MG PO TABS
5.0000 mg | ORAL_TABLET | ORAL | Status: DC | PRN
  Administered 2015-12-17: 5 mg via ORAL
  Administered 2015-12-17 – 2015-12-22 (×15): 10 mg via ORAL
  Filled 2015-12-16 (×9): qty 2
  Filled 2015-12-16: qty 1
  Filled 2015-12-16 (×6): qty 2

## 2015-12-16 MED ORDER — ROCURONIUM BROMIDE 50 MG/5ML IV SOLN
INTRAVENOUS | Status: AC
  Filled 2015-12-16: qty 1

## 2015-12-16 MED ORDER — SODIUM CHLORIDE 0.9 % IV SOLN
250.0000 [IU] | INTRAVENOUS | Status: DC | PRN
  Administered 2015-12-16: .8 [IU]/h via INTRAVENOUS

## 2015-12-16 MED ORDER — SODIUM CHLORIDE 0.9% FLUSH: 3.0000 mL | INTRAVENOUS | Status: DC | PRN

## 2015-12-16 MED ORDER — PROPOFOL 10 MG/ML IV BOLUS
INTRAVENOUS | Status: DC | PRN
  Administered 2015-12-16: 70 mg via INTRAVENOUS
  Administered 2015-12-16: 50 mg via INTRAVENOUS

## 2015-12-16 MED ORDER — PANTOPRAZOLE SODIUM 40 MG PO TBEC
40.0000 mg | DELAYED_RELEASE_TABLET | Freq: Every day | ORAL | Status: DC
  Administered 2015-12-18 – 2015-12-22 (×5): 40 mg via ORAL
  Filled 2015-12-16 (×5): qty 1

## 2015-12-16 MED ORDER — SODIUM CHLORIDE 0.9 % IV SOLN
INTRAVENOUS | Status: DC
  Administered 2015-12-16: 1.2 [IU]/h via INTRAVENOUS
  Filled 2015-12-16 (×2): qty 2.5

## 2015-12-16 MED ORDER — INSULIN REGULAR BOLUS VIA INFUSION
0.0000 [IU] | Freq: Three times a day (TID) | INTRAVENOUS | Status: DC
  Filled 2015-12-16: qty 10

## 2015-12-16 MED ORDER — PHENYLEPHRINE HCL 10 MG/ML IJ SOLN
0.0000 ug/min | INTRAVENOUS | Status: DC
  Filled 2015-12-16 (×3): qty 2

## 2015-12-16 MED ORDER — LACTATED RINGERS IV SOLN: 500.0000 mL | Freq: Once | INTRAVENOUS | Status: DC | PRN

## 2015-12-16 MED ORDER — MORPHINE SULFATE (PF) 2 MG/ML IV SOLN
2.0000 mg | INTRAVENOUS | Status: DC | PRN
  Administered 2015-12-16: 4 mg via INTRAVENOUS
  Administered 2015-12-16: 2 mg via INTRAVENOUS
  Administered 2015-12-17 – 2015-12-18 (×9): 4 mg via INTRAVENOUS
  Filled 2015-12-16 (×2): qty 2
  Filled 2015-12-16: qty 1
  Filled 2015-12-16 (×2): qty 2
  Filled 2015-12-16: qty 1
  Filled 2015-12-16 (×6): qty 2

## 2015-12-16 MED ORDER — ACETAMINOPHEN 160 MG/5ML PO SOLN: 650.0000 mg | Freq: Once | ORAL | Status: AC

## 2015-12-16 MED ORDER — ACETAMINOPHEN 500 MG PO TABS
1000.0000 mg | ORAL_TABLET | Freq: Four times a day (QID) | ORAL | Status: AC
Start: 2015-12-17 — End: 2015-12-21
  Administered 2015-12-18 – 2015-12-21 (×10): 1000 mg via ORAL
  Filled 2015-12-16 (×14): qty 2

## 2015-12-16 MED ORDER — METOPROLOL TARTRATE 1 MG/ML IV SOLN: 2.5000 mg | INTRAVENOUS | Status: DC | PRN

## 2015-12-16 MED ORDER — PHENYLEPHRINE 40 MCG/ML (10ML) SYRINGE FOR IV PUSH (FOR BLOOD PRESSURE SUPPORT)
PREFILLED_SYRINGE | INTRAVENOUS | Status: AC
  Filled 2015-12-16: qty 10

## 2015-12-16 MED ORDER — ONDANSETRON HCL 4 MG/2ML IJ SOLN
4.0000 mg | Freq: Four times a day (QID) | INTRAMUSCULAR | Status: DC | PRN
  Administered 2015-12-17 – 2015-12-21 (×7): 4 mg via INTRAVENOUS
  Filled 2015-12-16 (×8): qty 2

## 2015-12-16 MED ORDER — LACTATED RINGERS IV SOLN
INTRAVENOUS | Status: DC
Start: 2015-12-16 — End: 2015-12-17

## 2015-12-16 MED ORDER — FENTANYL CITRATE (PF) 100 MCG/2ML IJ SOLN
INTRAMUSCULAR | Status: DC | PRN
  Administered 2015-12-16: 100 ug via INTRAVENOUS
  Administered 2015-12-16: 200 ug via INTRAVENOUS
  Administered 2015-12-16: 250 ug via INTRAVENOUS
  Administered 2015-12-16 (×2): 150 ug via INTRAVENOUS
  Administered 2015-12-16 (×2): 100 ug via INTRAVENOUS
  Administered 2015-12-16: 50 ug via INTRAVENOUS
  Administered 2015-12-16: 250 ug via INTRAVENOUS
  Administered 2015-12-16: 150 ug via INTRAVENOUS

## 2015-12-16 MED ORDER — SODIUM CHLORIDE 0.9 % IV SOLN: INTRAVENOUS | Status: DC

## 2015-12-16 MED ORDER — MIDAZOLAM HCL 5 MG/5ML IJ SOLN
INTRAMUSCULAR | Status: DC | PRN
  Administered 2015-12-16: 1 mg via INTRAVENOUS
  Administered 2015-12-16: 2 mg via INTRAVENOUS
  Administered 2015-12-16: 3 mg via INTRAVENOUS
  Administered 2015-12-16: 4 mg via INTRAVENOUS
  Administered 2015-12-16: 1 mg via INTRAVENOUS
  Administered 2015-12-16: 2 mg via INTRAVENOUS
  Administered 2015-12-16: 7 mg via INTRAVENOUS

## 2015-12-16 MED ORDER — METOPROLOL TARTRATE 12.5 MG HALF TABLET
12.5000 mg | ORAL_TABLET | Freq: Two times a day (BID) | ORAL | Status: DC
  Filled 2015-12-16: qty 1

## 2015-12-16 MED ORDER — SODIUM CHLORIDE 0.9 % IV SOLN
INTRAVENOUS | Status: DC | PRN
  Administered 2015-12-16: 14:00:00 via INTRAVENOUS

## 2015-12-16 MED ORDER — ANTISEPTIC ORAL RINSE SOLUTION (CORINZ)
7.0000 mL | Freq: Four times a day (QID) | OROMUCOSAL | Status: DC
  Administered 2015-12-17 – 2015-12-21 (×10): 7 mL via OROMUCOSAL

## 2015-12-16 MED ORDER — DEXMEDETOMIDINE HCL IN NACL 200 MCG/50ML IV SOLN
0.0000 ug/kg/h | INTRAVENOUS | Status: DC
  Administered 2015-12-16: 0.7 ug/kg/h via INTRAVENOUS
  Filled 2015-12-16: qty 50

## 2015-12-16 MED ORDER — LIDOCAINE HCL (CARDIAC) 20 MG/ML IV SOLN
INTRAVENOUS | Status: DC | PRN
  Administered 2015-12-16: 40 mg via INTRAVENOUS

## 2015-12-16 MED ORDER — DEXTROSE 50 % IV SOLN
INTRAVENOUS | Status: AC
  Administered 2015-12-16: 15 mL
  Filled 2015-12-16: qty 50

## 2015-12-16 MED ORDER — TRAMADOL HCL 50 MG PO TABS
50.0000 mg | ORAL_TABLET | ORAL | Status: DC | PRN
  Administered 2015-12-19: 50 mg via ORAL
  Administered 2015-12-19 – 2015-12-22 (×4): 100 mg via ORAL
  Filled 2015-12-16 (×2): qty 2
  Filled 2015-12-16: qty 1
  Filled 2015-12-16 (×2): qty 2
  Filled 2015-12-16: qty 1

## 2015-12-16 MED ORDER — PHENYLEPHRINE HCL 10 MG/ML IJ SOLN
10.0000 mg | INTRAMUSCULAR | Status: DC | PRN
  Administered 2015-12-16: 20 ug/min via INTRAVENOUS

## 2015-12-16 MED ORDER — MORPHINE SULFATE (PF) 2 MG/ML IV SOLN
1.0000 mg | INTRAVENOUS | Status: AC | PRN
  Administered 2015-12-16: 2 mg via INTRAVENOUS

## 2015-12-16 MED ORDER — ALBUMIN HUMAN 5 % IV SOLN
INTRAVENOUS | Status: DC | PRN
  Administered 2015-12-16: 14:00:00 via INTRAVENOUS

## 2015-12-16 MED ORDER — EPHEDRINE SULFATE 50 MG/ML IJ SOLN
INTRAMUSCULAR | Status: DC | PRN
  Administered 2015-12-16 (×2): 10 mg via INTRAVENOUS

## 2015-12-16 MED ORDER — MIDAZOLAM HCL 10 MG/2ML IJ SOLN
INTRAMUSCULAR | Status: AC
  Filled 2015-12-16: qty 4

## 2015-12-16 MED ORDER — SODIUM CHLORIDE 0.9 % IV SOLN: 250.0000 mL | INTRAVENOUS | Status: DC

## 2015-12-16 MED ORDER — ALBUMIN HUMAN 5 % IV SOLN
250.0000 mL | INTRAVENOUS | Status: DC | PRN
  Administered 2015-12-16 – 2015-12-17 (×2): 250 mL via INTRAVENOUS

## 2015-12-16 MED ORDER — SODIUM CHLORIDE 0.9 % IV SOLN
INTRAVENOUS | Status: DC
  Administered 2015-12-16: 100 mL via INTRAVENOUS

## 2015-12-16 MED ORDER — MIDAZOLAM HCL 2 MG/2ML IJ SOLN: 2.0000 mg | INTRAMUSCULAR | Status: DC | PRN

## 2015-12-16 MED ORDER — ASPIRIN EC 325 MG PO TBEC
325.0000 mg | DELAYED_RELEASE_TABLET | Freq: Every day | ORAL | Status: DC
  Administered 2015-12-17 – 2015-12-20 (×4): 325 mg via ORAL
  Filled 2015-12-16 (×4): qty 1

## 2015-12-16 MED ORDER — PHENYLEPHRINE HCL 10 MG/ML IJ SOLN
20.0000 mg | INTRAMUSCULAR | Status: DC | PRN
  Administered 2015-12-16: 20 ug/min via INTRAVENOUS

## 2015-12-16 MED ORDER — CHLORHEXIDINE GLUCONATE CLOTH 2 % EX PADS
6.0000 | MEDICATED_PAD | Freq: Every day | CUTANEOUS | Status: AC
  Administered 2015-12-16 – 2015-12-20 (×5): 6 via TOPICAL

## 2015-12-16 MED ORDER — CHLORHEXIDINE GLUCONATE 0.12 % MT SOLN: 15.0000 mL | OROMUCOSAL | Status: AC

## 2015-12-16 MED ORDER — ROCURONIUM BROMIDE 50 MG/5ML IV SOLN
INTRAVENOUS | Status: AC
  Filled 2015-12-16: qty 2

## 2015-12-16 MED ORDER — SODIUM CHLORIDE 0.45 % IV SOLN
INTRAVENOUS | Status: DC | PRN
  Administered 2015-12-16: 10 mL via INTRAVENOUS

## 2015-12-16 MED ORDER — BISACODYL 10 MG RE SUPP: 10.0000 mg | Freq: Every day | RECTAL | Status: DC

## 2015-12-16 MED ORDER — LACTATED RINGERS IV SOLN
INTRAVENOUS | Status: DC | PRN
  Administered 2015-12-16: 08:00:00 via INTRAVENOUS

## 2015-12-16 MED ORDER — DEXTROSE 5 % IV SOLN
1.5000 g | Freq: Two times a day (BID) | INTRAVENOUS | Status: AC
  Administered 2015-12-16 – 2015-12-18 (×4): 1.5 g via INTRAVENOUS
  Filled 2015-12-16 (×4): qty 1.5

## 2015-12-16 MED ORDER — 0.9 % SODIUM CHLORIDE (POUR BTL) OPTIME
TOPICAL | Status: DC | PRN
  Administered 2015-12-16: 6000 mL

## 2015-12-16 MED ORDER — FENTANYL CITRATE (PF) 250 MCG/5ML IJ SOLN
INTRAMUSCULAR | Status: AC
  Filled 2015-12-16: qty 30

## 2015-12-16 MED ORDER — BUPIVACAINE 0.5 % ON-Q PUMP SINGLE CATH 400 ML
400.0000 mL | INJECTION | Status: DC
  Filled 2015-12-16: qty 400

## 2015-12-16 MED ORDER — EPHEDRINE SULFATE 50 MG/ML IJ SOLN
INTRAMUSCULAR | Status: AC
  Filled 2015-12-16: qty 1

## 2015-12-16 MED ORDER — METOPROLOL TARTRATE 25 MG/10 ML ORAL SUSPENSION: 12.5000 mg | Freq: Two times a day (BID) | ORAL | Status: DC

## 2015-12-16 MED ORDER — ROCURONIUM BROMIDE 100 MG/10ML IV SOLN
INTRAVENOUS | Status: DC | PRN
  Administered 2015-12-16: 60 mg via INTRAVENOUS
  Administered 2015-12-16: 40 mg via INTRAVENOUS
  Administered 2015-12-16 (×2): 50 mg via INTRAVENOUS

## 2015-12-16 MED ORDER — LIDOCAINE HCL (CARDIAC) 20 MG/ML IV SOLN
INTRAVENOUS | Status: AC
  Filled 2015-12-16: qty 5

## 2015-12-16 MED ORDER — FAMOTIDINE IN NACL 20-0.9 MG/50ML-% IV SOLN
20.0000 mg | Freq: Two times a day (BID) | INTRAVENOUS | Status: AC
  Administered 2015-12-16 – 2015-12-17 (×2): 20 mg via INTRAVENOUS
  Filled 2015-12-16: qty 50

## 2015-12-16 MED ORDER — SODIUM CHLORIDE 0.9% FLUSH
3.0000 mL | Freq: Two times a day (BID) | INTRAVENOUS | Status: DC
  Administered 2015-12-17 – 2015-12-20 (×4): 3 mL via INTRAVENOUS

## 2015-12-16 MED ORDER — BISACODYL 5 MG PO TBEC
10.0000 mg | DELAYED_RELEASE_TABLET | Freq: Every day | ORAL | Status: DC
  Administered 2015-12-17 – 2015-12-19 (×3): 10 mg via ORAL
  Filled 2015-12-16 (×4): qty 2

## 2015-12-16 MED ORDER — INSULIN ASPART 100 UNIT/ML ~~LOC~~ SOLN
0.0000 [IU] | SUBCUTANEOUS | Status: DC
  Administered 2015-12-18 – 2015-12-19 (×2): 2 [IU] via SUBCUTANEOUS

## 2015-12-16 MED ORDER — POTASSIUM CHLORIDE 10 MEQ/50ML IV SOLN
10.0000 meq | Freq: Once | INTRAVENOUS | Status: AC
  Administered 2015-12-16: 10 meq via INTRAVENOUS

## 2015-12-16 MED ORDER — CHLORHEXIDINE GLUCONATE 0.12% ORAL RINSE (MEDLINE KIT)
15.0000 mL | Freq: Two times a day (BID) | OROMUCOSAL | Status: DC
  Administered 2015-12-19 – 2015-12-20 (×4): 15 mL via OROMUCOSAL

## 2015-12-16 MED ORDER — PROPOFOL 10 MG/ML IV BOLUS
INTRAVENOUS | Status: AC
  Filled 2015-12-16: qty 20

## 2015-12-16 MED ORDER — VANCOMYCIN HCL IN DEXTROSE 1-5 GM/200ML-% IV SOLN
1000.0000 mg | Freq: Once | INTRAVENOUS | Status: AC
  Administered 2015-12-16: 1000 mg via INTRAVENOUS
  Filled 2015-12-16: qty 200

## 2015-12-16 MED ORDER — SODIUM CHLORIDE 0.9 % IV SOLN
200.0000 ug | INTRAVENOUS | Status: DC | PRN
  Administered 2015-12-16: 0.2 ug/kg/h via INTRAVENOUS

## 2015-12-16 MED ORDER — DOCUSATE SODIUM 100 MG PO CAPS
200.0000 mg | ORAL_CAPSULE | Freq: Every day | ORAL | Status: DC
Start: 2015-12-17 — End: 2015-12-22
  Administered 2015-12-17 – 2015-12-19 (×3): 200 mg via ORAL
  Filled 2015-12-16 (×4): qty 2

## 2015-12-16 MED ORDER — POTASSIUM CHLORIDE 10 MEQ/50ML IV SOLN
10.0000 meq | INTRAVENOUS | Status: AC
  Administered 2015-12-16 (×3): 10 meq via INTRAVENOUS

## 2015-12-16 MED ORDER — MAGNESIUM SULFATE 4 GM/100ML IV SOLN
4.0000 g | Freq: Once | INTRAVENOUS | Status: AC
  Administered 2015-12-16: 4 g via INTRAVENOUS
  Filled 2015-12-16: qty 100

## 2015-12-16 MED ORDER — ASPIRIN 81 MG PO CHEW: 324.0000 mg | CHEWABLE_TABLET | Freq: Every day | ORAL | Status: DC

## 2015-12-16 MED ORDER — SUCCINYLCHOLINE CHLORIDE 20 MG/ML IJ SOLN
INTRAMUSCULAR | Status: AC
  Filled 2015-12-16: qty 1

## 2015-12-16 MED ORDER — PROTAMINE SULFATE 10 MG/ML IV SOLN
INTRAVENOUS | Status: DC | PRN
  Administered 2015-12-16: 20 mg via INTRAVENOUS
  Administered 2015-12-16: 30 mg via INTRAVENOUS
  Administered 2015-12-16: 40 mg via INTRAVENOUS
  Administered 2015-12-16 (×2): 30 mg via INTRAVENOUS
  Administered 2015-12-16: 10 mg via INTRAVENOUS
  Administered 2015-12-16 (×2): 30 mg via INTRAVENOUS

## 2015-12-16 MED ORDER — ACETAMINOPHEN 650 MG RE SUPP
650.0000 mg | Freq: Once | RECTAL | Status: AC
  Administered 2015-12-16: 650 mg via RECTAL

## 2015-12-16 MED ORDER — HEPARIN SODIUM (PORCINE) 1000 UNIT/ML IJ SOLN
INTRAMUSCULAR | Status: DC | PRN
  Administered 2015-12-16: 27 mL via INTRAVENOUS

## 2015-12-16 MED ORDER — SODIUM CHLORIDE 0.9 % IV SOLN
10.0000 g | INTRAVENOUS | Status: DC | PRN
  Administered 2015-12-16: 5 g/h via INTRAVENOUS

## 2015-12-16 MED ORDER — MUPIROCIN 2 % EX OINT
1.0000 "application " | TOPICAL_OINTMENT | Freq: Two times a day (BID) | CUTANEOUS | Status: AC
  Administered 2015-12-16 – 2015-12-21 (×9): 1 via NASAL
  Filled 2015-12-16 (×5): qty 22

## 2015-12-16 MED ORDER — ACETAMINOPHEN 160 MG/5ML PO SOLN: 1000.0000 mg | Freq: Four times a day (QID) | ORAL | Status: DC

## 2015-12-16 MED ORDER — LACTATED RINGERS IV SOLN: INTRAVENOUS | Status: DC

## 2015-12-16 MED ORDER — SODIUM CHLORIDE 0.9 % IJ SOLN
INTRAMUSCULAR | Status: AC
  Filled 2015-12-16: qty 10

## 2015-12-16 MED ORDER — NITROGLYCERIN IN D5W 200-5 MCG/ML-% IV SOLN: 0.0000 ug/min | INTRAVENOUS | Status: DC

## 2015-12-16 MED FILL — Lidocaine HCl IV Inj 20 MG/ML: INTRAVENOUS | Qty: 5 | Status: AC

## 2015-12-16 MED FILL — Sodium Bicarbonate IV Soln 8.4%: INTRAVENOUS | Qty: 50 | Status: AC

## 2015-12-16 MED FILL — Mannitol IV Soln 20%: INTRAVENOUS | Qty: 500 | Status: AC

## 2015-12-16 MED FILL — Heparin Sodium (Porcine) Inj 1000 Unit/ML: INTRAMUSCULAR | Qty: 30 | Status: AC

## 2015-12-16 MED FILL — Magnesium Sulfate Inj 50%: INTRAMUSCULAR | Qty: 10 | Status: AC

## 2015-12-16 MED FILL — Electrolyte-R (PH 7.4) Solution: INTRAVENOUS | Qty: 4000 | Status: AC

## 2015-12-16 MED FILL — Heparin Sodium (Porcine) Inj 1000 Unit/ML: INTRAMUSCULAR | Qty: 10 | Status: AC

## 2015-12-16 MED FILL — Potassium Chloride Inj 2 mEq/ML: INTRAVENOUS | Qty: 40 | Status: AC

## 2015-12-16 MED FILL — Sodium Chloride IV Soln 0.9%: INTRAVENOUS | Qty: 2000 | Status: AC

## 2015-12-16 SURGICAL SUPPLY — 134 items
ADAPTER CARDIO PERF ANTE/RETRO (ADAPTER) ×4 IMPLANT
ADH SKN CLS APL DERMABOND .7 (GAUZE/BANDAGES/DRESSINGS) ×6
ADPR PRFSN 84XANTGRD RTRGD (ADAPTER) ×3
ARTICLIP LAA PROCLIP II 45 (Clip) ×4 IMPLANT
BAG DECANTER FOR FLEXI CONT (MISCELLANEOUS) ×4 IMPLANT
BLADE SURG 11 STRL SS (BLADE) ×4 IMPLANT
CANISTER SUCTION 2500CC (MISCELLANEOUS) ×8 IMPLANT
CANNULA FEM VENOUS REMOTE 22FR (CANNULA) ×1 IMPLANT
CANNULA FEMORAL ART 14 SM (MISCELLANEOUS) ×4 IMPLANT
CANNULA GUNDRY RCSP 15FR (MISCELLANEOUS) ×4 IMPLANT
CANNULA OPTISITE PERFUSION 16F (CANNULA) IMPLANT
CANNULA OPTISITE PERFUSION 18F (CANNULA) ×1 IMPLANT
CANNULA SUMP PERICARDIAL (CANNULA) ×8 IMPLANT
CARDIOBLATE CARDIAC ABLATION (MISCELLANEOUS)
CATH KIT ON Q 5IN DUAL SLV (PAIN MANAGEMENT) ×1 IMPLANT
CATH KIT ON Q 5IN SLV (PAIN MANAGEMENT) IMPLANT
CLAMP OLL ABLATION (MISCELLANEOUS) ×1 IMPLANT
CONN ST 1/4X3/8  BEN (MISCELLANEOUS) ×2
CONN ST 1/4X3/8 BEN (MISCELLANEOUS) ×6 IMPLANT
CONNECTOR 1/2X3/8X1/2 3 WAY (MISCELLANEOUS) ×1
CONNECTOR 1/2X3/8X1/2 3WAY (MISCELLANEOUS) ×3 IMPLANT
CONT SPEC STER OR (MISCELLANEOUS) ×4 IMPLANT
COVER BACK TABLE 24X17X13 BIG (DRAPES) ×4 IMPLANT
COVER PROBE W GEL 5X96 (DRAPES) ×4 IMPLANT
CRADLE DONUT ADULT HEAD (MISCELLANEOUS) ×4 IMPLANT
DERMABOND ADVANCED (GAUZE/BANDAGES/DRESSINGS) ×2
DERMABOND ADVANCED .7 DNX12 (GAUZE/BANDAGES/DRESSINGS) ×6 IMPLANT
DEVICE ATRICLIP LAA PRCLPII 45 (Clip) IMPLANT
DEVICE CARDIOBLATE CARDIAC ABL (MISCELLANEOUS) IMPLANT
DEVICE PMI PUNCTURE CLOSURE (MISCELLANEOUS) ×4 IMPLANT
DEVICE TROCAR PUNCTURE CLOSURE (ENDOMECHANICALS) ×4 IMPLANT
DRAIN CHANNEL 28F RND 3/8 FF (WOUND CARE) ×8 IMPLANT
DRAPE BILATERAL SPLIT (DRAPES) ×4 IMPLANT
DRAPE C-ARM 42X72 X-RAY (DRAPES) ×4 IMPLANT
DRAPE CV SPLIT W-CLR ANES SCRN (DRAPES) ×4 IMPLANT
DRAPE INCISE IOBAN 66X45 STRL (DRAPES) ×9 IMPLANT
DRAPE SLUSH/WARMER DISC (DRAPES) ×4 IMPLANT
DRSG COVADERM 4X10 (GAUZE/BANDAGES/DRESSINGS) ×1 IMPLANT
DRSG COVADERM 4X8 (GAUZE/BANDAGES/DRESSINGS) ×4 IMPLANT
ELECT BLADE 4.0 EZ CLEAN MEGAD (MISCELLANEOUS) ×4
ELECT BLADE 6.5 EXT (BLADE) ×4 IMPLANT
ELECT REM PT RETURN 9FT ADLT (ELECTROSURGICAL) ×8
ELECTRODE BLDE 4.0 EZ CLN MEGD (MISCELLANEOUS) IMPLANT
ELECTRODE REM PT RTRN 9FT ADLT (ELECTROSURGICAL) ×6 IMPLANT
FEMORAL VENOUS CANN RAP (CANNULA) IMPLANT
GAUZE SPONGE 4X4 12PLY STRL (GAUZE/BANDAGES/DRESSINGS) ×4 IMPLANT
GLOVE BIO SURGEON STRL SZ 6.5 (GLOVE) ×5 IMPLANT
GLOVE BIOGEL PI IND STRL 6.5 (GLOVE) IMPLANT
GLOVE BIOGEL PI IND STRL 7.0 (GLOVE) IMPLANT
GLOVE BIOGEL PI INDICATOR 6.5 (GLOVE) ×3
GLOVE BIOGEL PI INDICATOR 7.0 (GLOVE) ×1
GLOVE ORTHO TXT STRL SZ7.5 (GLOVE) ×12 IMPLANT
GOWN STRL REUS W/ TWL LRG LVL3 (GOWN DISPOSABLE) ×12 IMPLANT
GOWN STRL REUS W/TWL LRG LVL3 (GOWN DISPOSABLE) ×28
INSERT CONFORM CROSS CLAMP 66M (MISCELLANEOUS) IMPLANT
INSERT CONFORM CROSS CLAMP 86M (MISCELLANEOUS) IMPLANT
KIT BASIN OR (CUSTOM PROCEDURE TRAY) ×4 IMPLANT
KIT DILATOR VASC 18G NDL (KITS) ×4 IMPLANT
KIT DRAINAGE VACCUM ASSIST (KITS) ×1 IMPLANT
KIT ROOM TURNOVER OR (KITS) ×4 IMPLANT
KIT SUCTION CATH 14FR (SUCTIONS) ×5 IMPLANT
LEAD PACING MYOCARDI (MISCELLANEOUS) ×4 IMPLANT
LINE VENT (MISCELLANEOUS) ×1 IMPLANT
LOOP VESSEL SUPERMAXI WHITE (MISCELLANEOUS) ×1 IMPLANT
NDL AORTIC ROOT 14G 7F (CATHETERS) ×3 IMPLANT
NEEDLE AORTIC ROOT 14G 7F (CATHETERS) ×4 IMPLANT
NS IRRIG 1000ML POUR BTL (IV SOLUTION) ×21 IMPLANT
PACK OPEN HEART (CUSTOM PROCEDURE TRAY) ×4 IMPLANT
PAD ARMBOARD 7.5X6 YLW CONV (MISCELLANEOUS) ×8 IMPLANT
PAD ELECT DEFIB RADIOL ZOLL (MISCELLANEOUS) ×4 IMPLANT
PATCH CORMATRIX 4CMX7CM (Prosthesis & Implant Heart) ×1 IMPLANT
PROBE CRYO2-ABLATION MALLABLE (MISCELLANEOUS) ×1 IMPLANT
RETRACTOR TRL SOFT TISSUE LG (INSTRUMENTS) IMPLANT
RETRACTOR TRM SOFT TISSUE 7.5 (INSTRUMENTS) IMPLANT
SET CANNULATION TOURNIQUET (MISCELLANEOUS) ×4 IMPLANT
SET CARDIOPLEGIA MPS 5001102 (MISCELLANEOUS) ×1 IMPLANT
SET IRRIG TUBING LAPAROSCOPIC (IRRIGATION / IRRIGATOR) ×4 IMPLANT
SOLUTION ANTI FOG 6CC (MISCELLANEOUS) ×4 IMPLANT
SPONGE GAUZE 4X4 12PLY STER LF (GAUZE/BANDAGES/DRESSINGS) ×1 IMPLANT
SUT BONE WAX W31G (SUTURE) ×5 IMPLANT
SUT E-PACK MINIMALLY INVASIVE (SUTURE) ×4 IMPLANT
SUT ETHIBOND (SUTURE) IMPLANT
SUT ETHIBOND 2 0 SH (SUTURE) IMPLANT
SUT ETHIBOND 2 0 V4 (SUTURE) IMPLANT
SUT ETHIBOND 2 0V4 GREEN (SUTURE) IMPLANT
SUT ETHIBOND 2-0 RB-1 WHT (SUTURE) IMPLANT
SUT ETHIBOND 4 0 TF (SUTURE) IMPLANT
SUT ETHIBOND 5 0 C 1 30 (SUTURE) IMPLANT
SUT ETHIBOND NAB MH 2-0 36IN (SUTURE) ×1 IMPLANT
SUT ETHIBOND X763 2 0 SH 1 (SUTURE) ×4 IMPLANT
SUT GORETEX 6.0 TH-9 30 IN (SUTURE) IMPLANT
SUT GORETEX CV 4 TH 22 36 (SUTURE) ×4 IMPLANT
SUT GORETEX CV-5THC-13 36IN (SUTURE) IMPLANT
SUT GORETEX CV4 TH-18 (SUTURE) ×8 IMPLANT
SUT GORETEX TH-18 36 INCH (SUTURE) IMPLANT
SUT MNCRL AB 3-0 PS2 18 (SUTURE) IMPLANT
SUT MNCRL AB 3-0 PS2 27 (SUTURE) ×2 IMPLANT
SUT PROLENE 3 0 SH 1 (SUTURE) ×4 IMPLANT
SUT PROLENE 3 0 SH DA (SUTURE) ×5 IMPLANT
SUT PROLENE 3 0 SH1 36 (SUTURE) ×20 IMPLANT
SUT PROLENE 4 0 RB 1 (SUTURE) ×12
SUT PROLENE 4-0 RB1 .5 CRCL 36 (SUTURE) IMPLANT
SUT PROLENE 5 0 C 1 36 (SUTURE) ×2 IMPLANT
SUT PROLENE 6 0 C 1 30 (SUTURE) ×2 IMPLANT
SUT SILK  1 MH (SUTURE) ×7
SUT SILK 1 MH (SUTURE) IMPLANT
SUT SILK 1 TIES 10X30 (SUTURE) ×1 IMPLANT
SUT SILK 2 0 SH CR/8 (SUTURE) ×1 IMPLANT
SUT SILK 2 0 TIES 10X30 (SUTURE) ×1 IMPLANT
SUT SILK 2 0SH CR/8 30 (SUTURE) ×2 IMPLANT
SUT SILK 3 0 (SUTURE) ×4
SUT SILK 3 0 SH CR/8 (SUTURE) ×1 IMPLANT
SUT SILK 3 0SH CR/8 30 (SUTURE) ×1 IMPLANT
SUT SILK 3-0 18XBRD TIE 12 (SUTURE) IMPLANT
SUT TEM PAC WIRE 2 0 SH (SUTURE) ×2 IMPLANT
SUT VIC AB 2-0 CTX 36 (SUTURE) ×2 IMPLANT
SUT VIC AB 2-0 UR6 27 (SUTURE) ×2 IMPLANT
SUT VIC AB 3-0 SH 8-18 (SUTURE) ×3 IMPLANT
SUT VICRYL 2 TP 1 (SUTURE) ×1 IMPLANT
SYRINGE 10CC LL (SYRINGE) ×4 IMPLANT
SYS ATRICLIP LAA EXCLUSION 45 (CLIP) IMPLANT
SYSTEM SAHARA CHEST DRAIN ATS (WOUND CARE) ×8 IMPLANT
TAPE CLOTH SURG 4X10 WHT LF (GAUZE/BANDAGES/DRESSINGS) ×1 IMPLANT
TAPE PAPER 2X10 WHT MICROPORE (GAUZE/BANDAGES/DRESSINGS) ×1 IMPLANT
TOWEL OR 17X24 6PK STRL BLUE (TOWEL DISPOSABLE) ×8 IMPLANT
TOWEL OR 17X26 10 PK STRL BLUE (TOWEL DISPOSABLE) ×8 IMPLANT
TRAY FOLEY IC TEMP SENS 16FR (CATHETERS) ×4 IMPLANT
TROCAR XCEL BLADELESS 5X75MML (TROCAR) ×4 IMPLANT
TROCAR XCEL NON-BLD 11X100MML (ENDOMECHANICALS) ×8 IMPLANT
TUBE SUCT INTRACARD DLP 20F (MISCELLANEOUS) ×4 IMPLANT
TUNNELER SHEATH ON-Q 11GX8 DSP (PAIN MANAGEMENT) ×1 IMPLANT
UNDERPAD 30X30 INCONTINENT (UNDERPADS AND DIAPERS) ×4 IMPLANT
WATER STERILE IRR 1000ML POUR (IV SOLUTION) ×8 IMPLANT
WIRE BENTSON .035X145CM (WIRE) ×4 IMPLANT

## 2015-12-16 NOTE — OR Nursing (Signed)
14:15 - 1st call to SICU; 14:35 - 2nd call to SICU

## 2015-12-16 NOTE — Anesthesia Procedure Notes (Signed)
Procedure Name: Intubation Date/Time: 12/16/2015 9:05 AM Performed by: Lance Coon Pre-anesthesia Checklist: Patient identified, Suction available, Emergency Drugs available, Patient being monitored and Timeout performed Patient Re-evaluated:Patient Re-evaluated prior to inductionOxygen Delivery Method: Circle system utilized Preoxygenation: Pre-oxygenation with 100% oxygen Intubation Type: IV induction Ventilation: Mask ventilation without difficulty Laryngoscope Size: Glidescope and 3 Grade View: Grade II Endobronchial tube: Left, Double lumen EBT, EBT position confirmed by auscultation and EBT position confirmed by fiberoptic bronchoscope and 35 Fr Number of attempts: 1 Airway Equipment and Method: Stylet and Video-laryngoscopy Placement Confirmation: positive ETCO2,  ETT inserted through vocal cords under direct vision and breath sounds checked- equal and bilateral Secured at: 27 cm Tube secured with: Tape Dental Injury: Teeth and Oropharynx as per pre-operative assessment  Comments: Unable to pass double lumen tube under video laryngoscopy, single lumen ETT placed atraumatically, cook exchange catheter utilized to change single lumen ETT to double lumen

## 2015-12-16 NOTE — Op Note (Signed)
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 The patient was first diagnosed with atrial fibrillation in 2013. She initially presented with increased urinary urgency and frequency related to chronic recurrent urinary tract infections. She was found to be in atrial fibrillation with rapid ventricular response at the time. She spontaneously converted to sinus rhythm and initially was treated with beta blocker therapy. She has been followed ever since intermittently by Dr. Gwenlyn Found, Dr. Rayann Heman, and Dr. Lovena Le. She does not experience symptoms of Chest pain nor frequent palpitations, and she reports that her primary symptoms associated with recurrent episodes of atrial fibrillation are decreased exercise tolerance and fatigue. She has been placed on an event monitor in the past that has documented continued episodes of recurrent paroxysmal atrial  fibrillation. She at times has palpitations, although these symptoms are not typically limiting or problematic. She was initially not treated with anticoagulation due to low CHADSVASC score. She was hospitalized March 29 through April 6 of this year with atrial flutter associated with rapid ventricular response. At the time she presented with symptoms of increased fatigue. She ruled out for acute myocardial infarction. She was started on Eliquis for anticoagulation and TEE was performed with intervention of proceeding with DC cardioversion. However, TEE reviewed a small mass in the left atrial appendage consistent with left atrial thrombus. Cardioversion was canceled and the patient was treated with beta blocker and diltiazem for rate control. Amiodarone was added prior to hospital discharge. She was seen in follow-up by Dr. Lovena Le on 03/31/2015 at which time she was noted to have converted spontaneously to sinus rhythm, although she remained bradycardic while in sinus rhythm.. She was referred back to Dr. Rayann Heman to consider possible ablation for recurrent atrial fibrillation and atrial flutter. She was seen in consultation by Dr. Rayann Heman 04/10/2015. Her dose of amiodarone was decreased and diltiazem was stopped. Plans were made for elective catheter-based ablation pending results of repeat TEE. Follow-up TEE performed by Dr. Sallyanne Kuster on 05/14/2015 revealed persistent small mass in the left atrial appendage consistent with possible mass versus thrombus. The patient was referred for surgical consultation. The patient has been seen in consultation and counseled at length regarding the indications, risks and potential benefits of surgery.  All questions have been answered, and the patient provides full informed consent for the operation as described.               DETAILS OF THE OPERATIVE PROCEDURE  Preparation:  The patient is brought to the operating room on the above mentioned date  and central  monitoring was established by the anesthesia team including placement of Swan-Ganz catheter through the left internal jugular vein.  A radial arterial line is placed. The patient is placed in the supine position on the operating table.  Intravenous antibiotics are administered. General endotracheal anesthesia is induced uneventfully. The patient is initially intubated using a dual lumen endotracheal tube.  A Foley catheter is placed.  Baseline transesophageal echocardiogram was performed.  Findings were notable for A small frond--like mass within the left atrium located at the base of the left atrial appendage immediately anterior to the left superior pulmonary vein.  The mass was small and measured less than 1 cm in diameter and appeared to be attached to the endocardial surface of the left atrium by a thin stalk. The mass was very mobile and had gross characteristics consistent with papillary fibro-elastoma.  There was normal left ventricular systolic function. There was trace mitral regurgitation. Aortic valve appeared normal. No other significant abnormalities were noted.  A soft roll is placed behind the patient's left scapula and the neck gently extended and turned to the left.   The patient's right neck, chest, abdomen, both groins, and both lower extremities are prepared and draped in a sterile manner. A time out procedure is performed.   Surgical Approach:  A right miniature anterolateral thoracotomy incision is performed. The incision is placed just lateral to and superior to the right nipple. The pectoralis major muscle is retracted medially and completely preserved. The right pleural space is entered through the 3rd intercostal space. A soft tissue retractor is placed.  Two 11 mm ports are placed through separate stab incisions inferiorly. The right pleural space is insufflated continuously with carbon dioxide gas through the posterior port during the remainder of the operation.  A pledgeted  sutures placed through the dome of the right hemidiaphragm and retracted inferiorly to facilitate exposure.  A longitudinal incision is made in the pericardium 3 cm anterior to the phrenic nerve and silk traction sutures are placed on either side of the incision for exposure.   Extracorporeal Cardiopulmonary Bypass and Myocardial Protection:  A small incision is made in the right inguinal crease and the anterior surface of the right common femoral artery and right common femoral vein are identified.  The patient is placed in Trendelenburg position. The right internal jugular vein is cannulated with Seldinger technique and a guidewire advanced into the right atrium. The patient is heparinized systemically. The right internal jugular vein is cannulated with a 14 [place] pediatric femoral venous cannula. Pursestring sutures are placed on the anterior surface of the right common femoral vein and right common femoral artery. The right common femoral vein is cannulated with the Seldinger technique and a guidewire is advanced under transesophageal echocardiogram guidance through the right atrium. The femoral vein is cannulated with a long 22 French femoral venous cannula. The right common femoral artery is cannulated with Seldinger technique and a flexible guidewire is advanced until it can be appreciated intraluminally in the descending thoracic aorta on transesophageal echocardiogram. The femoral artery is cannulated with an 18 French femoral arterial cannula.  Adequate heparinization is verified.      The entire pre-bypass portion of the operation was notable for stable hemodynamics.  Cardiopulmonary bypass was begun.  Vacuum assist venous drainage is utilized. The incision in the pericardium is extended in both directions. Venous drainage and exposure are notably excellent.   A retrograde cardioplegia cannula is placed through the right atrium into the coronary  sinus using transesophageal echocardiogram  guidance.  An antegrade cardioplegia cannula is placed in the ascending aorta.  The patient is cooled to 28C systemic temperature.  The aortic cross clamp is applied and cold blood cardioplegia is delivered initially in an antegrade fashion through the aortic root.   Supplemental cardioplegia is given retrograde through the coronary sinus catheter. The initial cardioplegic arrest is rapid with early diastolic arrest.  Repeat doses of cardioplegia are administered intermittently every 20 to 30 minutes throughout the entire cross clamp portion of the operation through the aortic root and through the coronary sinus catheter in order to maintain completely flat electrocardiogram.  Myocardial protection was felt to be excellent.   Resection of Left Atrial Mass:  A left atriotomy incision is performed posteriorly through the intra-atrial groove. The floor of the left atrium and the mitral valve are exposed using a left atrial lift retractor. Immediately apparent is a small mass emanating from the endocardial surface of the left atrium immediately anterior to the left superior pulmonary vein along the Coumadin ridge just cephalad to the base of the left atrial appendage. The mass is soft and gelatinous in appearance with gross characteristics consistent with papillary fibro-elastoma.  The mass is excised sharply. The mass is quite friable and bits of it break into pieces on excision. A generous piece of the left atrial wall is excised full thickness surrounding the stalk of the mass creating a small defect in the posterior wall of the left atrium. The mass is sent to pathology for routine histology. The defect in the left atrial wall is closed using running 3-0 Prolene suture. A single horizontal mattress pledgeted sutures placed to  Buttress the repair because of the somewhat thin nature of the left atrial wall tissue.   Maze Procedure (left atrial lesion set):  The Atricure CryoICE nitrous oxide cryothermy  system is utilized for all cryothermy ablation lesions.  The left atrial lesion set of the Cox cryomaze procedure is now performed using 3 minute duration for all cryothermy lesions.  Initially a lesion is placed along the endocardial surface of the left atrium from the caudad apex of the atriotomy incision across the posterior wall of the left atrium onto the posterior mitral annulus.  A mirror image lesion along the epicardial surface is then performed with the probe posterior to the left atrium, crossing over the coronary sinus.  Two lesions are then performed to create a box isolating all of the pulmonary veins from the remainder of the left atrium.  The first lesion is placed from the cephalad apex of the atriotomy incision across the dome of the left atrium to just anterior to the left sided pulmonary veins.  The second lesion completes the box from the caudad apex of the atriotomy incision across the back wall of the left atrium to connect with the previous lesion just anterior to the left sided pulmonary veins.  Finally, the bipolar radiofrequency ablation clamp was utilized to create an ablation lesion around the base of the right-sided pulmonary veins.  The atriotomy was closed using a 2-layer closure of running 3-0 Prolene suture after placing a sump drain across the mitral valve to serve as a left ventricular vent.  One final dose of warm retrograde "hot shot" cardioplegia was administered retrograde through through the aortic root.  The aortic cross clamp was removed after a total cross clamp time of 75 minutes.  The left atrial appendage is obliterated using an Atricure left atrial appendage clip (Atriclip,  size 68mm).  The clip is applied under thoracoscopic visualization posterior to the aorta and pulmonary artery through the oblique sinus.  The clip was applied after removal of the aortic crossclamp, with transesophageal echocardiographic confirmation that the clip satisfactorily obliterates the  appendage.   Maze Procedure (right atrial lesion set):  The retrograde cardioplegia cannula was removed and the small hole in the right atrium extended a short distance.  The AtriCure Synergy bipolar radiofrequency ablation clamp is utilized to create a series of linear lesions in the right atrium, each with one limb of the clamp along the endocardial surface and the other along the epicardial surface. The first lesion is placed from the posterior apex of the atriotomy incision and along the lateral wall of the right atrium to reach the lateral aspect of the superior vena cava. A second lesion is placed in the opposite direction from the posterior apex of the atriotomy incision along the lateral wall to reach the lateral aspect of the inferior vena cava. A third lesion is placed from the midportion of the atriotomy incision extending at a right angle to reach the tip of the right atrial appendage. A fourth lesion is placed from the anterior apex of the atriotomy incision in an anterior and inferior direction to reach the acute margin of the heart. Finally, the cryotherapy probe is utilized to complete the right atrial lesion set by placing the probe along the endocardial surface of the right atrium from the anterior apex of the atriotomy incision to reach the tricuspid annulus at the 2:00 position. An additional cryotherapy lesion is created across the right atrial isthmus extending from the acute margin of the heart around the orifice of the inferior vena cava.  The atriotomy incision is closed with a 2 layer closure of running 4-0 Prolene suture.   Procedure Completion:  Epicardial pacing wires are fixed to the inferior wall of the right ventricule and to the right atrial appendage. The patient is rewarmed to 37C temperature. The left ventricular vent is removed.  The patient is ventilated and flow volumes turndown while the mitral valve repair is inspected using transesophageal echocardiogram. The  valve repair appears intact with no residual leak. The antegrade cardioplegia cannula is now removed. The patient is weaned and disconnected from cardiopulmonary bypass.  The patient's rhythm at separation from bypass was sinus.  The patient was weaned from bypass without any inotropic support. Total cardiopulmonary bypass time for the operation was 146 minutes.  Followup transesophageal echocardiogram performed after separation from bypass revealed that the previously seen mass had been surgically removed and the left atrial appendage was completely occluded.  Left ventricular function was unchanged from preoperatively.  The femoral arterial and venous cannulae were removed uneventfully. There was a palpable pulse in the distal right common femoral artery after removal of the cannula. Protamine was administered to reverse the anticoagulation. The right internal jugular cannula was removed and manual pressure held on the neck for 15 minutes.  Single lung ventilation was begun. The atriotomy closure was inspected for hemostasis. The pericardial sac was drained using a 28 French Bard drain placed through the anterior port incision.  The pericardium was closed using a patch of core matrix bovine submucosal tissue patch. The right pleural space is irrigated with saline solution and inspected for hemostasis. The right pleural space was drained using a 28 French Bard drain placed through the posterior port incision. The miniature thoracotomy incision was closed in multiple layers in routine fashion. The right  groin incision was inspected for hemostasis and closed in multiple layers in routine fashion.  The post-bypass portion of the operation was notable for stable rhythm and hemodynamics.  No blood products were administered during the operation.   Disposition:  The patient tolerated the procedure well.  The patient was reintubated using a single lumen endotracheal tube and subsequently transported to the  surgical intensive care unit in stable condition. There were no intraoperative complications. All sponge instrument and needle counts are verified correct at completion of the operation.    Valentina Gu. Roxy Manns MD 12/16/2015 2:26 PM

## 2015-12-16 NOTE — Progress Notes (Signed)
Attempted wean, however, patient too sedated at this time.  RN aware.  RT will continue to follow.

## 2015-12-16 NOTE — Progress Notes (Signed)
      [address]       [place] [address]             [phone]      S/p resection LA mass, maze  Intubated, sedated  BP 100/68 mmHg  Pulse 80  Temp(Src) 97.5 F (36.4 C) (Core (Comment))  Resp 14  Wt 196 lb 8 oz (89.132 kg)  SpO2 100%  LMP 12/25/2012  CI= 2.1   Intake/Output Summary (Last 24 hours) at 12/16/15 1910 Last data filed at 12/16/15 1900  Gross per 24 hour  Intake 4468.1 ml  Output   2970 ml  Net 1498.1 ml    Stable early postop.  Continue vent wean  Revonda Standard. Roxan Hockey, MD [facility] [phone]

## 2015-12-16 NOTE — CV Procedure (Signed)
Intra-operative Transesophageal Echocardiography Report:  Katlyn Landuyt is a 55 year old female with recurrent paroxysmal atrial fibrillation and atrial flutter who was found on TEE to have a small mass in the left atrial appendage. Cardiac catheterization was negative for coronary artery disease and she was found to have normal systolic left ventricular function on echocardiography. She is now brought to the operating room for excision of the left atrial appendage mass and Maze procedure by Dr. Roxy Manns using the minimally invasive technique.  The patient was brought to the operating room at [facility] and general anesthesia was induced without difficulty. Following endotracheal intubation and orogastric suctioning, the transesophageal echocardiography probe was inserted into the esophagus without difficulty.  Impression: Pre-bypass Findings:  1. Left atrium: There was a mass noted at the base of the left atrial appendage which was freely mobile.This was noted on the lateral aspect of the left atrial appendage in close approximation to the ligament of Marshall. The mass measured 8.2 mm in the superior-inferior dimension and 5.7 mm meters in the mediolateral dimension. It had a freely mobile character and the appearance was consistent with a fibroblastoma. No thrombus was noted in the left atrial appendage. The left atrium was mildly enlarged and measured 4.8 cm in the superior-inferior dimension.  2. Aortic valve: The aortic valve was trileaflet. The leaflets opened normally without restriction. There was trace aortic insufficiency.  3. Mitral valve: The mitral valve appeared structurally normal. There was normal coaptation of the leaflets without prolapsing or flail segments. There was trace mitral insufficiency  4. Left ventricle: There was normal systolic left ventricular function. There were no regional wall motion abnormalities and the ejection fraction was estimated at 60%. Left ventricular  end-diastolic diameter measured 4.75 cm at the mid-papillary level in the transgastric short axis view. The anterior wall measured 0.88 cm and the posterior wall measured 0.85 cm at end diastole at the mid-papillary level.  5. Right ventricle: The right ventricular cavity was of normal size. There was normal contractility of the right ventricular free wall and normal appearing right ventricular function.  6. Tricuspid valve: The tricuspid valve appeared structurally normal and there was 1+ tricuspid insufficiency.  7. Atrial septum: The interatrial septum was intact without evidence of patent foramen ovale or atrial septal defect by color Doppler and bubble study.  8. Descending aorta: The ascending aorta showed a well-defined aortic root and sinotubular ridge without dilatation. There was mild thickening of the wall of the ascending aorta but no protruding atheromata noted.  9. Descending aorta: There was mild 2+, atheromatous disease  scattered within the wall the descending aorta. The descending aorta measured 17 mm in diameter.   Post-bypass findings:  1. Left atrium: There was  thickening and evidence of suture in the region where the left atrial mass had been imaged previously. No residual atrial mass could be appreciated. The left atrial appendage could no longer be imaged. Color Doppler interrogation of the region where the left atrial mass had been showed no evidence of  fistulous communication.  2. Aortic valve: The aortic valve was unchanged from the pre-bypass study. The leaflets opened normally and there was trace aortic insufficiency.  3. Mitral valve: The mitral leaflets coapted normally and there was trace mitral insufficiency.  4. Left ventricle: There was normal appearing left ventricular systolic function. The ejection fraction was estimated at 55-60%.  5. Right ventricle: Right ventricular cavity was normal size and there was normal appearing right ventricular  function.  6. Tricuspid valve:  The tricuspid valve appeared structurally unchanged from the pre-bypass study there was 1-2+ tricuspid insufficiency.  Roberts Gaudy, M.D.

## 2015-12-16 NOTE — Anesthesia Postprocedure Evaluation (Signed)
Anesthesia Post Note  Patient: Miranda Taylor  Procedure(s) Performed: Procedure(s) (LRB): MINIMALLY INVASIVE RESECTION OF LEFT ATRIAL MASS (Right) MINIMALLY INVASIVE MAZE PROCEDURE (N/A) TRANSESOPHAGEAL ECHOCARDIOGRAM (TEE) (N/A) CLIPPING OF ATRIAL APPENDAGE (N/A)  Patient location during evaluation: SICU Anesthesia Type: General Level of consciousness: patient remains intubated per anesthesia plan Pain management: pain level controlled Vital Signs Assessment: post-procedure vital signs reviewed and stable Respiratory status: patient remains intubated per anesthesia plan and patient on ventilator - see flowsheet for VS Anesthetic complications: no    Last Vitals:  Filed Vitals:   12/16/15 1700 12/16/15 1715  BP: 120/87   Pulse: 80 80  Temp: 35.8 C 36.1 C  Resp: 12 12    Last Pain: There were no vitals filed for this visit.               , COKER

## 2015-12-16 NOTE — Transfer of Care (Signed)
Immediate Anesthesia Transfer of Care Note  Patient: Miranda Taylor  Procedure(s) Performed: Procedure(s): MINIMALLY INVASIVE RESECTION OF LEFT ATRIAL MASS (Right) MINIMALLY INVASIVE MAZE PROCEDURE (N/A) TRANSESOPHAGEAL ECHOCARDIOGRAM (TEE) (N/A) CLIPPING OF ATRIAL APPENDAGE (N/A)  Patient Location: SICU  Anesthesia Type:General  Level of Consciousness: sedated, unresponsive and Patient remains intubated per anesthesia plan  Airway & Oxygen Therapy: Patient remains intubated per anesthesia plan and Patient placed on Ventilator (see vital sign flow sheet for setting)  Post-op Assessment: Report given to RN and Post -op Vital signs reviewed and stable  Post vital signs: Reviewed and stable  Last Vitals:  Filed Vitals:   12/16/15 0651  BP: 135/74  Pulse: 45  Temp: 36.9 C  Resp: 18    Complications: No apparent anesthesia complications

## 2015-12-16 NOTE — Brief Op Note (Signed)
12/16/2015  2:18 PM  PATIENT:  Miranda Taylor  55 y.o. 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  Cryo: Yes  Cryo Lesions (select all that apply):        2   Box Lesion,     4  Posterior Mirtal Annular Line,     6  Mitral Valve Cryo Lesion,     9   Intercaval Line to Tricuspid Annulus ("T" Lesion) and    10  Tricuspid Cryo Lesion, Medial   16  Other - epicardial posterior AV groove and coronary sinus, right atrial isthmus lestion    Radiofrequency:  Yes.  Bipolar: Yes.  RF Lesions (select all that apply):     9   Intercaval Line to Tricuspid Annulus ("T" Lesion),    11  Intercaval Line and   15b  RAA Lateral Wall to "T" Lesion ,16 Other - isolation of right pulmonary veins   Left Atrial Appendage Treatment:    Yes-  epicardial clip    COMPLICATIONS: None  BASELINE WEIGHT: 89 kg  PATIENT DISPOSITION:   TO SICU IN STABLE CONDITION  Rexene Alberts, MD 12/16/2015 2:18 PM

## 2015-12-16 NOTE — Addendum Note (Signed)
Addendum  created 12/16/15 2117 by Roberts Gaudy, MD   Modules edited: Notes Section   Notes Section:  File: [id number]; Pend: [id number]; Raelyn Number: [id number]; Raelyn Number: [id number]; Pend: [id number]

## 2015-12-16 NOTE — Progress Notes (Signed)
Pt took 25 mg of PO metoprolol at 0500 this morning. Pulse in the 40s, 12.5 mg ordered PO metoprolol not given.

## 2015-12-16 NOTE — Progress Notes (Signed)
Re-attempted rapid wean per protocol.  Patient passed SIMV phase and was placed on PS 10/5.  Initially RR and Vt were within protocol limits, however, patient went apneic during several occasions requiring initiation of back-up ventilation.  Patient placed back on resting settings.  RT will continue to follow.

## 2015-12-16 NOTE — Progress Notes (Signed)
  Echocardiogram Echocardiogram Transesophageal has been performed.  Miranda Taylor 12/16/2015, 10:49 AM

## 2015-12-16 NOTE — Interval H&P Note (Signed)
History and Physical Interval Note:  12/16/2015 6:56 AM  Miranda Taylor  has presented today for surgery, with the diagnosis of LEFT ATRIAL MASS AFIB  The various methods of treatment have been discussed with the patient and family. After consideration of risks, benefits and other options for treatment, the patient has consented to  Procedure(s): MINIMALLY INVASIVE RESECTION OF LEFT ATRIAL MASS (Right) MINIMALLY INVASIVE MAZE PROCEDURE (N/A) TRANSESOPHAGEAL ECHOCARDIOGRAM (TEE) (N/A) as a surgical intervention .  The patient's history has been reviewed, patient examined, no change in status, stable for surgery.  I have reviewed the patient's chart and labs.  Questions were answered to the patient's satisfaction.     Rexene Alberts

## 2015-12-17 ENCOUNTER — Inpatient Hospital Stay (HOSPITAL_COMMUNITY): Payer: BLUE CROSS/BLUE SHIELD

## 2015-12-17 ENCOUNTER — Encounter (HOSPITAL_COMMUNITY): Payer: Self-pay | Admitting: Thoracic Surgery (Cardiothoracic Vascular Surgery)

## 2015-12-17 LAB — GLUCOSE, CAPILLARY
GLUCOSE-CAPILLARY: 101 mg/dL — AB (ref 65–99)
GLUCOSE-CAPILLARY: 110 mg/dL — AB (ref 65–99)
GLUCOSE-CAPILLARY: 111 mg/dL — AB (ref 65–99)
GLUCOSE-CAPILLARY: 111 mg/dL — AB (ref 65–99)
GLUCOSE-CAPILLARY: 120 mg/dL — AB (ref 65–99)
Glucose-Capillary: 105 mg/dL — ABNORMAL HIGH (ref 65–99)

## 2015-12-17 LAB — CBC
HEMATOCRIT: 30.9 % — AB (ref 36.0–46.0)
HEMATOCRIT: 28.2 % — AB (ref 36.0–46.0)
HEMOGLOBIN: 9.8 g/dL — AB (ref 12.0–15.0)
Hemoglobin: 9.2 g/dL — ABNORMAL LOW (ref 12.0–15.0)
MCH: 27.1 pg (ref 26.0–34.0)
MCH: 28.3 pg (ref 26.0–34.0)
MCHC: 31.7 g/dL (ref 30.0–36.0)
MCHC: 32.6 g/dL (ref 30.0–36.0)
MCV: 85.4 fL (ref 78.0–100.0)
MCV: 86.8 fL (ref 78.0–100.0)
PLATELETS: 98 10*3/uL — AB (ref 150–400)
Platelets: 119 10*3/uL — ABNORMAL LOW (ref 150–400)
RBC: 3.62 MIL/uL — ABNORMAL LOW (ref 3.87–5.11)
RBC: 3.25 MIL/uL — AB (ref 3.87–5.11)
RDW: 13.9 % (ref 11.5–15.5)
RDW: 14.3 % (ref 11.5–15.5)
WBC: 13.1 10*3/uL — AB (ref 4.0–10.5)
WBC: 11 10*3/uL — ABNORMAL HIGH (ref 4.0–10.5)

## 2015-12-17 LAB — POCT I-STAT, CHEM 8
BUN: 14 mg/dL (ref 6–20)
CALCIUM ION: 1.29 mmol/L — AB (ref 1.12–1.23)
Chloride: 100 mmol/L — ABNORMAL LOW (ref 101–111)
Creatinine, Ser: 0.8 mg/dL (ref 0.44–1.00)
GLUCOSE: 128 mg/dL — AB (ref 65–99)
HCT: 30 % — ABNORMAL LOW (ref 36.0–46.0)
Hemoglobin: 10.2 g/dL — ABNORMAL LOW (ref 12.0–15.0)
Potassium: 4.5 mmol/L (ref 3.5–5.1)
SODIUM: 136 mmol/L (ref 135–145)
TCO2: 21 mmol/L (ref 0–100)

## 2015-12-17 LAB — CREATININE, SERUM
Creatinine, Ser: 0.98 mg/dL (ref 0.44–1.00)
GFR calc non Af Amer: >60 mL/min (ref >60)

## 2015-12-17 LAB — POCT I-STAT 3, ART BLOOD GAS (G3+)
ACID-BASE DEFICIT: 5 mmol/L — AB (ref 0.0–2.0)
Bicarbonate: 19.6 mEq/L — ABNORMAL LOW (ref 20.0–24.0)
O2 SAT: 99 %
PCO2 ART: 36 mmHg (ref 35.0–45.0)
PH ART: 7.345 — AB (ref 7.350–7.450)
TCO2: 21 mmol/L (ref 0–100)
pO2, Arterial: 126 mmHg — ABNORMAL HIGH (ref 80.0–100.0)

## 2015-12-17 LAB — BASIC METABOLIC PANEL
ANION GAP: 4 — AB (ref 5–15)
BUN: 11 mg/dL (ref 6–20)
CALCIUM: 7.9 mg/dL — AB (ref 8.9–10.3)
CHLORIDE: 109 mmol/L (ref 101–111)
CO2: 23 mmol/L (ref 22–32)
Creatinine, Ser: 0.81 mg/dL (ref 0.44–1.00)
GFR calc non Af Amer: >60 mL/min (ref >60)
Glucose, Bld: 115 mg/dL — ABNORMAL HIGH (ref 65–99)
Potassium: 4.6 mmol/L (ref 3.5–5.1)
SODIUM: 136 mmol/L (ref 135–145)

## 2015-12-17 LAB — MAGNESIUM
MAGNESIUM: 2.2 mg/dL (ref 1.7–2.4)
Magnesium: 1.8 mg/dL (ref 1.7–2.4)

## 2015-12-17 MED ORDER — KETOROLAC TROMETHAMINE 15 MG/ML IJ SOLN
15.0000 mg | Freq: Four times a day (QID) | INTRAMUSCULAR | Status: AC
  Administered 2015-12-17 – 2015-12-18 (×5): 15 mg via INTRAVENOUS
  Filled 2015-12-17 (×5): qty 1

## 2015-12-17 MED ORDER — LORAZEPAM 0.5 MG PO TABS
0.5000 mg | ORAL_TABLET | Freq: Four times a day (QID) | ORAL | Status: DC | PRN
  Administered 2015-12-21: 0.5 mg via ORAL
  Filled 2015-12-17: qty 1

## 2015-12-17 MED ORDER — PROMETHAZINE HCL 25 MG/ML IJ SOLN
12.5000 mg | Freq: Four times a day (QID) | INTRAMUSCULAR | Status: DC | PRN
  Administered 2015-12-17 – 2015-12-21 (×5): 12.5 mg via INTRAVENOUS
  Filled 2015-12-17 (×5): qty 1

## 2015-12-17 MED ORDER — ENOXAPARIN SODIUM 30 MG/0.3ML ~~LOC~~ SOLN
30.0000 mg | SUBCUTANEOUS | Status: DC
  Administered 2015-12-18 – 2015-12-21 (×4): 30 mg via SUBCUTANEOUS
  Filled 2015-12-17 (×5): qty 0.3

## 2015-12-17 MED ORDER — AMIODARONE HCL 200 MG PO TABS
200.0000 mg | ORAL_TABLET | Freq: Two times a day (BID) | ORAL | Status: DC
  Administered 2015-12-17 – 2015-12-22 (×11): 200 mg via ORAL
  Filled 2015-12-17 (×10): qty 1

## 2015-12-17 NOTE — Progress Notes (Signed)
[address]       [place] [address]             [phone]        CARDIOTHORACIC SURGERY PROGRESS NOTE   R1 Day Post-Op Procedure(s) (LRB): MINIMALLY INVASIVE RESECTION OF LEFT ATRIAL MASS (Right) MINIMALLY INVASIVE MAZE PROCEDURE (N/A) TRANSESOPHAGEAL ECHOCARDIOGRAM (TEE) (N/A) CLIPPING OF ATRIAL APPENDAGE (N/A)  Subjective: Patient complaining of pain 9/10 in her right chest and upper back since this morning after taking oxycodone, also with OnQ. She also says she is having severe nausea without vomiting.  Objective: Vital signs: BP Readings from Last 1 Encounters:  12/17/15 95/53   Pulse Readings from Last 1 Encounters:  12/17/15 80   Resp Readings from Last 1 Encounters:  12/17/15 15   Temp Readings from Last 1 Encounters:  12/17/15 98.4 F (36.9 C)     Hemodynamics: PAP: (18-36)/(7-24) 27/12 mmHg CO:  [3.8 L/min-5.1 L/min] 5.1 L/min CI:  [1.9 L/min/m2-2.5 L/min/m2] 2.5 L/min/m2  Physical Exam:  Rhythm:   Sinus  Breath sounds: Shallow. Clear to bilateral ascultation   Heart sounds:  Regular rate and rhythm  Incisions:  Dressing clean, dry, intact  Abdomen:  Soft, non-tender , non-distended  Extremities:  Warm, well-perfused  Chest tubes:  Decreasing volume thin, serosanguinous drainage Intake/Output from previous day: 02/08 0701 - 02/09 0700 In: 6210.6 [P.O.:60; I.V.:4230.6; Blood:570; IV [id number] Out: [id number] [Urine:2395; Blood:1625; Chest Tube:530] Intake/Output this shift:    Lab Results:  CBC: Recent Labs  12/16/15 2050 12/16/15 2051 12/17/15 0505  WBC 10.0  --  13.1*  HGB 10.3* 10.9* 9.8*  HCT 32.6* 32.0* 30.9*  PLT 97*  --  119*    BMET:  Recent Labs  12/14/15 1005  12/16/15 2051 12/17/15 0505  NA 142  < > 140 136  K 3.9  < > 4.8 4.6  CL 107  < > 109 109  CO2 25  --   --  23  GLUCOSE 84  < > 101* 115*  BUN 17  < > 15 11  CREATININE 1.04*  < > 0.70 0.81  CALCIUM 9.3  --   --  7.9*  < > = values  in this interval not displayed.   PT/INR:   Recent Labs  12/16/15 1505  LABPROT 17.1*  INR 1.38    CBG (last 3)   Recent Labs  12/16/15 2349 12/17/15 0412 12/17/15 0757  GLUCAP 110* 101* 111*    ABG    Component Value Date/Time   PHART 7.345* 12/17/2015 0013   PCO2ART 36.0 12/17/2015 0013   PO2ART 126.0* 12/17/2015 0013   HCO3 19.6* 12/17/2015 0013   TCO2 21 12/17/2015 0013   ACIDBASEDEF 5.0* 12/17/2015 0013   O2SAT 99.0 12/17/2015 0013    CXR: COMPARISON: 12/16/2015.  FINDINGS: Interim extubation removal of NG tube. Right chest tube, left IJ line, Swan-Ganz catheter stable position. Heart size stable. Left atrial appendage clip in stable position. Low lung volumes with slight improvement of bibasilar atelectasis and/or infiltrates. No prominent pleural effusion. No pneumothorax.  IMPRESSION: 1. Interim removal of endotracheal tube and NG tube. Remaining lines and tubes including right chest tube and Swan-Ganz catheter stable position. No pneumothorax. 2. Persistent low lung volumes with bibasilar atelectasis and/or infiltrates. Interim slight improvement from prior exam.   Electronically Signed  By: [place]  On: 12/17/2015 07:18   Assessment/Plan: S/P Procedure(s) (LRB): MINIMALLY INVASIVE RESECTION OF LEFT ATRIAL MASS (Right) MINIMALLY INVASIVE MAZE PROCEDURE (  N/A) TRANSESOPHAGEAL ECHOCARDIOGRAM (TEE) (N/A) CLIPPING OF ATRIAL APPENDAGE (N/A)  Stable POD1 Maintaining NSR w/ stable hemodynamics, on low dose Neo Breathing comfortably w/ O2 sats 100% on 3 L/min via [place] Expected post op acute blood loss anemia, mild Expected post op atelectasis, mild Chronic systolic CHF with expected post op volume excess   Mobilize  Hold diuretics until BP stable off Neo  D/C lines  D/C tubes later today or tomorrow pending output  Restart Amiodarone tomorrow  Start lovenox tomorrow and resume Eliquis at hospital d/c  Toradol for  pain   Lawernce Keas, PA-S 12/17/2015 8:26 AM   I have seen and examined the patient and agree with the assessment and plan as outlined.  Rexene Alberts, MD 12/17/2015

## 2015-12-17 NOTE — Progress Notes (Signed)
Utilization Review Completed.  

## 2015-12-17 NOTE — Procedures (Signed)
Extubation Procedure Note  Patient Details:   Name: Miranda Taylor DOB: Oct 09, 1961 MRN: [id number]   Airway Documentation:  Airway 8 mm (Active)  Secured at (cm) 21 cm 12/16/2015 11:22 PM  Measured From Lips 12/16/2015 11:22 PM  Secured Location Right 12/16/2015 11:22 PM  Secured By Pink Tape 12/16/2015 11:22 PM  Site Condition Dry 12/16/2015 11:22 PM    Evaluation  O2 sats: stable throughout Complications: No apparent complications Patient did tolerate procedure well. Bilateral Breath Sounds: Clear Suctioning: Airway, Oral Yes  Patient passed rapid wean protocol and able to follow commands appropriately.  ABG within protocol limits.   Leak test positive.  Extubated to 5L [place] without complication.  Coached on deep breathing and cough.  Rayne Du  12/17/2015, 12:28 AM

## 2015-12-17 NOTE — Progress Notes (Signed)
Pt able to follow commands appropriately.  NIF: -22 VC: 1.4L

## 2015-12-17 NOTE — Care Management Note (Signed)
Case Management Note  Patient Details  Name: Miranda Taylor MRN: [id number] Date of Birth: May 17, 1961  Subjective/Objective:   Pt lives with spouse who will be available to assist as needed when she is medically stable for discharge.               Expected Discharge Plan:  Home/Self Care  Discharge planning Services  CM Consult  Status of Service:  In process, will continue to follow  Girard Cooter, RN 12/17/2015, 2:00 PM

## 2015-12-17 NOTE — Progress Notes (Signed)
CT surgery p.m. Rounds  Patient resting comfortably Minimal incisional pain Paced rhythm with stable blood pressure Minimal chest tube drainage

## 2015-12-18 ENCOUNTER — Inpatient Hospital Stay (HOSPITAL_COMMUNITY): Payer: BLUE CROSS/BLUE SHIELD

## 2015-12-18 LAB — CBC
HEMATOCRIT: 30.3 % — AB (ref 36.0–46.0)
HEMOGLOBIN: 9.4 g/dL — AB (ref 12.0–15.0)
MCH: 27.1 pg (ref 26.0–34.0)
MCHC: 31 g/dL (ref 30.0–36.0)
MCV: 87.3 fL (ref 78.0–100.0)
Platelets: 119 10*3/uL — ABNORMAL LOW (ref 150–400)
RBC: 3.47 MIL/uL — AB (ref 3.87–5.11)
RDW: 14.7 % (ref 11.5–15.5)
WBC: 12.4 10*3/uL — ABNORMAL HIGH (ref 4.0–10.5)

## 2015-12-18 LAB — GLUCOSE, CAPILLARY
GLUCOSE-CAPILLARY: 90 mg/dL (ref 65–99)
GLUCOSE-CAPILLARY: 133 mg/dL — AB (ref 65–99)
Glucose-Capillary: 103 mg/dL — ABNORMAL HIGH (ref 65–99)
Glucose-Capillary: 107 mg/dL — ABNORMAL HIGH (ref 65–99)
Glucose-Capillary: 114 mg/dL — ABNORMAL HIGH (ref 65–99)
Glucose-Capillary: 115 mg/dL — ABNORMAL HIGH (ref 65–99)
Glucose-Capillary: 120 mg/dL — ABNORMAL HIGH (ref 65–99)

## 2015-12-18 LAB — BASIC METABOLIC PANEL
ANION GAP: 4 — AB (ref 5–15)
BUN: 16 mg/dL (ref 6–20)
CHLORIDE: 106 mmol/L (ref 101–111)
CO2: 25 mmol/L (ref 22–32)
Calcium: 8.5 mg/dL — ABNORMAL LOW (ref 8.9–10.3)
Creatinine, Ser: 1.01 mg/dL — ABNORMAL HIGH (ref 0.44–1.00)
GFR calc non Af Amer: >60 mL/min (ref >60)
Glucose, Bld: 111 mg/dL — ABNORMAL HIGH (ref 65–99)
Potassium: 4.2 mmol/L (ref 3.5–5.1)
Sodium: 135 mmol/L (ref 135–145)

## 2015-12-18 MED ORDER — METOPROLOL TARTRATE 12.5 MG HALF TABLET
12.5000 mg | ORAL_TABLET | Freq: Two times a day (BID) | ORAL | Status: DC
  Administered 2015-12-19 – 2015-12-22 (×6): 12.5 mg via ORAL
  Filled 2015-12-18 (×7): qty 1

## 2015-12-18 MED ORDER — SODIUM CHLORIDE 0.9 % IV SOLN: 250.0000 mL | INTRAVENOUS | Status: DC | PRN

## 2015-12-18 MED ORDER — FUROSEMIDE 40 MG PO TABS
40.0000 mg | ORAL_TABLET | Freq: Two times a day (BID) | ORAL | Status: DC
  Filled 2015-12-18: qty 1

## 2015-12-18 MED ORDER — SODIUM CHLORIDE 0.9% FLUSH: 3.0000 mL | INTRAVENOUS | Status: DC | PRN

## 2015-12-18 MED ORDER — SUMATRIPTAN SUCCINATE 100 MG PO TABS
100.0000 mg | ORAL_TABLET | Freq: Every day | ORAL | Status: DC | PRN
  Filled 2015-12-18: qty 1

## 2015-12-18 MED ORDER — METOPROLOL TARTRATE 12.5 MG HALF TABLET: 12.5000 mg | ORAL_TABLET | Freq: Two times a day (BID) | ORAL | Status: DC

## 2015-12-18 MED ORDER — POTASSIUM CHLORIDE CRYS ER 20 MEQ PO TBCR
20.0000 meq | EXTENDED_RELEASE_TABLET | Freq: Two times a day (BID) | ORAL | Status: DC
  Administered 2015-12-19 – 2015-12-20 (×4): 20 meq via ORAL
  Filled 2015-12-18 (×4): qty 1

## 2015-12-18 MED ORDER — MORPHINE SULFATE (PF) 2 MG/ML IV SOLN
1.0000 mg | INTRAVENOUS | Status: DC | PRN
  Administered 2015-12-18 – 2015-12-21 (×7): 2 mg via INTRAVENOUS
  Filled 2015-12-18 (×6): qty 1

## 2015-12-18 MED ORDER — SODIUM CHLORIDE 0.9% FLUSH
3.0000 mL | Freq: Two times a day (BID) | INTRAVENOUS | Status: DC
  Administered 2015-12-18 – 2015-12-21 (×6): 3 mL via INTRAVENOUS

## 2015-12-18 MED ORDER — MOVING RIGHT ALONG BOOK
Freq: Once | Status: AC
  Administered 2015-12-18: 1
  Filled 2015-12-18: qty 1

## 2015-12-18 MED ORDER — DESVENLAFAXINE SUCCINATE ER 100 MG PO TB24
100.0000 mg | ORAL_TABLET | Freq: Every day | ORAL | Status: DC
  Administered 2015-12-20 – 2015-12-22 (×3): 100 mg via ORAL
  Filled 2015-12-18 (×6): qty 1

## 2015-12-18 NOTE — Progress Notes (Signed)
[address]       [place] [address]             [phone]        CARDIOTHORACIC SURGERY PROGRESS NOTE   R2 Days Post-Op Procedure(s) (LRB): MINIMALLY INVASIVE RESECTION OF LEFT ATRIAL MASS (Right) MINIMALLY INVASIVE MAZE PROCEDURE (N/A) TRANSESOPHAGEAL ECHOCARDIOGRAM (TEE) (N/A) CLIPPING OF ATRIAL APPENDAGE (N/A)  Subjective: Patient feels improved this morning with less nausea and improvements in pain. No post op BM.  Objective: Vital signs: BP Readings from Last 1 Encounters:  12/18/15 92/64   Pulse Readings from Last 1 Encounters:  12/18/15 80   Resp Readings from Last 1 Encounters:  12/18/15 14   Temp Readings from Last 1 Encounters:  12/18/15 98.4 F (36.9 C) Oral    Hemodynamics:    Physical Exam:  Rhythm:   Sinus  Breath sounds: Shallow. Clear to auscultation bilaterally  Heart sounds:  Regular rate and rhythm  Incisions:  Clean and dry  Abdomen:  Soft, non-tender, mildly distended, bowel sounds present  Extremities:  Warm,well perfused, mild edema appreciated  Chest tubes:  Decreasing volume thin serosanguinous output, no airleak   Intake/Output from previous day: 02/09 0701 - 02/10 0700 In: 980.2 [P.O.:210; I.V.:620.2; IV Piggyback:150] Out: 2395 [Urine:1645; Chest Tube:750] Intake/Output this shift:    Lab Results:  CBC: Recent Labs  12/17/15 1700 12/17/15 1709 12/18/15 0415  WBC 11.0*  --  12.4*  HGB 9.2* 10.2* 9.4*  HCT 28.2* 30.0* 30.3*  PLT 98*  --  119*    BMET:  Recent Labs  12/17/15 0505  12/17/15 1709 12/18/15 0415  NA 136  --  136 135  K 4.6  --  4.5 4.2  CL 109  --  100* 106  CO2 23  --   --  25  GLUCOSE 115*  --  128* 111*  BUN 11  --  14 16  CREATININE 0.81  < > 0.80 1.01*  CALCIUM 7.9*  --   --  8.5*  < > = values in this interval not displayed.   PT/INR:   Recent Labs  12/16/15 1505  LABPROT 17.1*  INR 1.38    CBG (last 3)   Recent Labs  12/17/15 1917 12/17/15 2355  12/18/15 0356  GLUCAP 111* 107* 90    ABG    Component Value Date/Time   PHART 7.345* 12/17/2015 0013   PCO2ART 36.0 12/17/2015 0013   PO2ART 126.0* 12/17/2015 0013   HCO3 19.6* 12/17/2015 0013   TCO2 21 12/17/2015 1709   ACIDBASEDEF 5.0* 12/17/2015 0013   O2SAT 99.0 12/17/2015 0013    CXR: COMPARISON: 12/17/2015  FINDINGS: Swan-Ganz catheter has been removed. Central venous catheter sheath and central venous catheter from left internal jugular approach are stable in position. Right-sided chest tube is also stable. Left atrial appendage clip is again seen.  Cardiomediastinal silhouette is normal. Mediastinal contours appear intact.  There is no evidence of pneumothorax. There is persistent bibasilar atelectasis and probable bilateral small pleural effusions. More confluent airspace consolidation. Left lower lobe cannot be excluded.  Osseous structures are without acute abnormality. Soft tissues are grossly normal.  IMPRESSION: Removal of Swan Ganz catheter, otherwise stable appearance of support apparatus.  Persistent bibasilar opacities likely representing atelectasis and small bilateral pleural effusions. More confluent airspace consolidation in the left lower lobe cannot be excluded.   Electronically Signed  By: Fidela Salisbury M.D.  On: 12/18/2015 07:44   Assessment/Plan: S/P Procedure(s) (LRB):  MINIMALLY INVASIVE RESECTION OF LEFT ATRIAL MASS (Right) MINIMALLY INVASIVE MAZE PROCEDURE (N/A) TRANSESOPHAGEAL ECHOCARDIOGRAM (TEE) (N/A) CLIPPING OF ATRIAL APPENDAGE (N/A)  Stable POD2 Maintaining NSR w/ stable hemodynamics, on low dose Neo Breathing comfortably w/ O2 sats 100% on 1 L/min via [place] Expected post op acute blood loss anemia, mild Expected post op atelectasis, mild Chronic systolic CHF with expected post op volume excess   Mobilize  Hold diuretics until BP stable off Neo  Leave chest tubes in until output  minimal  Restart Amiodarone   Start lovenox today and resume Eliquis at hospital d/c  Lawernce Keas, PA-S 12/18/2015 8:03 AM  I have seen and examined the patient and agree with the assessment and plan as outlined.  Mobilize.  Wean Neo off.  Transfer step down later today or tomorrow once stable off Neo.  Rexene Alberts, MD 12/18/2015 10:13 AM

## 2015-12-18 NOTE — Progress Notes (Addendum)
Report attempted to be called to [place] on [facility], unable to take report at this time, a/w callback. Sherrie Mustache 4:33 PM   Report called to RN on [facility]. 4:45 PM

## 2015-12-18 NOTE — Progress Notes (Signed)
Patient received from [place], placed on telemetry and vital signs obtained, and CCMD called. Will continue to monitor patient. , Bettina Gavia RN

## 2015-12-18 NOTE — Discharge Summary (Signed)
Physician Discharge Summary  Patient ID: Miranda Taylor MRN: [id number] DOB/AGE: 55-26-1962 55 y.o.  Admit date: 12/16/2015 Discharge date: 12/22/2015  Admission Diagnoses:  Patient Active Problem List   Diagnosis Date Noted  . Atrial mass 10/12/2015  . Paroxysmal atrial fibrillation (HCC)   . Overweight 03/31/2015  . Chronic diastolic heart failure ([place]) 02/24/2015  . Atrial flutter with rapid ventricular response ([place]) 02/03/2015  . PAF (paroxysmal atrial fibrillation) ([place]) 02/03/2015  . Hyperglycemia 02/03/2015  . Family history of early CAD 10/15/2014  . Major depressive disorder, recurrent episode, moderate ([place]) 11/11/2013  . Anxiety and depression 08/26/2013  . Chronic lower back pain   . Fibromyalgia   . GERD (gastroesophageal reflux disease)   . DDD (degenerative disc disease), cervical   . UTI (lower urinary tract infection) 04/02/2012  . Migraines 04/02/2012   Discharge Diagnoses:   Patient Active Problem List   Diagnosis Date Noted  . 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 Atrial mass 10/12/2015  . Paroxysmal atrial fibrillation (HCC)   . Overweight 03/31/2015  . Chronic diastolic heart failure ([place]) 02/24/2015  . Atrial flutter with rapid ventricular response ([place]) 02/03/2015  . PAF (paroxysmal atrial fibrillation) ([place]) 02/03/2015  . Hyperglycemia 02/03/2015  . Family history of early CAD 10/15/2014  . Major depressive disorder, recurrent episode, moderate ([place]) 11/11/2013  . Anxiety and depression 08/26/2013  . Chronic lower back pain   . Fibromyalgia   . GERD (gastroesophageal reflux disease)   . DDD (degenerative disc disease), cervical   . UTI (lower urinary tract infection) 04/02/2012  . Migraines 04/02/2012   Discharged Condition: good  History of Present Illness:  Miranda Taylor is a 55  yo female with history of Paroxysmal Atrial Fibrillation and Flutter.  This was originally diagnosed in 2013 after presenting with complaints related to urinary tract infections.  She was initially treated with a beta blocker after conversion to NSR.  Since that time the patient has been routinely followed by Dr. Gwenlyn Taylor, Miranda Taylor, and Miranda Taylor.  She denies symptoms of chest pain, palpitations, but does have decreased exercise tolerance and fatigue.  She was admitted in March of last year after presenting with complaints of fatigue and was Taylor to be in Atrial flutter with RVR.  She was ruled out for MI.  She was started on Eliquis and underwent TEE prior to cardioversion.  She was Taylor to have a small mass in the left atrial appendage consistent with thrombus.  Due to this cardioversion was canceled.  She was again followed on an outpatient basis and again TEE was performed in July which again showed evidence of a mass in the left atrial appendage.  Due to this she was referred to [facility] for surgical consultation.  She was initially evaluated by Miranda Taylor on 05/18/2015 at which time it was felt to leave on anticoagulation and repeat TEE in 3 months.  Repeat echocardiogram in November continued to show a mass present despite anticoagulation.  It was felt this would likely require surgical intervention.  The risks and benefits of the procedure were explained to the patient and she was agreeable to proceed.  Cardiac catheterization was done and showed normal coronaries.      Hospital Course:   She presented to [facility] on 12/16/2015.  She was taken to the operating room and underwent Minimally Invasive Resection of Left Atrial Mass  and MAZE procedure.  She tolerated the procedure without difficulty and was taken to the SICU in stable condition.  She was extubated the evening of surgery.  She was weaned off Neo synephrine as tolerated.  She experienced post operative nausea which responded well to anti-emetics.   Her arterial lines were removed without difficulty.  She was maintaining NSR and felt medically stable for transfer to the telemetry unit.     Her chest tubes remained for several days until the output decreased. They were removed on 12/21/2015. Follow up chest x ray showed small, stable right apical pneumothorax and small bilateral pleural effusions. She is ambulating on room air. She is tolerating a diet and has had a bowel movement. She is maintaining sinus rhythm on Amiodarone 200 mg bid and Lopressor 12.5 mg bid. She will be restarted on Eliquis at discharge and per Miranda Taylor, no aspirin. She is felt surgically stable for discharge today.  Significant Diagnostic Studies: EXAM: CHEST 2 VIEW  COMPARISON: Portable chest x-ray of 12/21/2015  FINDINGS: The lung bases are slightly better aerated. There is persistent bibasilar atelectasis present. Also small effusions are noted left slightly greater than right. The right chest tube has removed been removed and there is a small approximately 5% right apical pneumothorax present. Heart size is stable.  IMPRESSION: 1. Slightly improved aeration of the lung bases. 2. Small right apical pneumothorax of approximately 5% after removal of right chest tube. 3. Small effusions.   Electronically Signed  By: Miranda Taylor M.D.  On: 12/22/2015 08:05 Discharge Exam: Blood pressure 102/56, pulse 69, temperature 98.1 F (36.7 C), temperature source Oral, resp. rate 18, height 5' 8.4" (1.737 m), weight 189 lb 6.4 oz (85.911 kg), last menstrual period 12/25/2012, SpO2 96 %.  Cardiovascular: RRR, no murmur Pulmonary: Slightly diminished at bases; no rales, wheezes, or rhonchi. Abdomen: Soft, non tender, bowel sounds present. Extremities: Trace Taylor edema Wounds: Clean and dry. No erythema or signs of infection. Minor ecchymosis right breast area  Disposition: 01-Home or Self Care     Medication List    STOP taking these medications         zolpidem 5 MG tablet  Commonly known as:  AMBIEN      TAKE these medications        amiodarone 200 MG tablet  Commonly known as:  PACERONE  Take 1 tablet (200 mg total) by mouth daily.     apixaban 5 MG Tabs tablet  Commonly known as:  ELIQUIS  Take 1 tablet (5 mg total) by mouth 2 (two) times daily.     cycloSPORINE 0.05 % ophthalmic emulsion  Commonly known as:  RESTASIS  Place 1 drop into both eyes 2 (two) times daily as needed (for dry eyes).     desvenlafaxine 50 MG 24 hr tablet  Commonly known as:  PRISTIQ  Take 100 mg by mouth daily before breakfast.     furosemide 40 MG tablet  Commonly known as:  LASIX  For 5 days then stop.     LORazepam 0.5 MG tablet  Commonly known as:  ATIVAN  Take 0.5 mg by mouth at bedtime.     metoprolol tartrate 25 MG tablet  Commonly known as:  LOPRESSOR  Take 0.5 tablets (12.5 mg total) by mouth 2 (two) times daily.     multivitamin with minerals Tabs tablet  Take 1 tablet by mouth daily.     NEXIUM 40 MG capsule  Generic drug:  esomeprazole  Take 40  mg by mouth daily as needed (for heartburn).     ondansetron 4 MG disintegrating tablet  Commonly known as:  ZOFRAN ODT  Take 1 tablet (4 mg total) by mouth every 8 (eight) hours as needed for nausea or vomiting.     oxyCODONE 5 MG immediate release tablet  Commonly known as:  Oxy IR/ROXICODONE  Take 1-2 tablets (5-10 mg total) by mouth every 4 (four) hours as needed for severe pain.     polyethylene glycol powder powder  Commonly known as:  GLYCOLAX/MIRALAX  Take 17 g by mouth every other day.     potassium chloride 10 MEQ tablet  Commonly known as:  K-DUR  For 5 days then stop.     rizatriptan 10 MG disintegrating tablet  Commonly known as:  MAXALT-MLT  Take 10 mg by mouth daily as needed for migraine.     triamcinolone cream 0.1 %  Commonly known as:  KENALOG  Apply 1 application topically daily as needed (for rash). Apply after bathing       Follow-up Information     Follow up with Rexene Alberts, MD On 01/18/2016.   Specialty:  Cardiothoracic Surgery   Why:  Appointment is at 11:00   Contact information:   [address] [place] [address] [phone]       Follow up with [facility] On 01/18/2016.   Why:  Please get CXR at 10:30   Contact information:   [facility]       Follow up with HAGER, BRYAN, PA-C On 01/06/2016.   Specialties:  Physician Assistant, Radiology, Interventional Cardiology   Why:  Appointment time is at 8:30 am   Contact information:   [place] [address] [phone]       Signed: Nani Skillern PA-C 12/22/2015, 9:13 AM

## 2015-12-18 NOTE — Progress Notes (Signed)
Pt transferred to [id number] via wheelchair, vss, receiving RN at bedside, settled in chair, husband at bedside. Sherrie Mustache 5:31 PM w

## 2015-12-19 LAB — CBC
HEMATOCRIT: 29.4 % — AB (ref 36.0–46.0)
HEMOGLOBIN: 9.1 g/dL — AB (ref 12.0–15.0)
MCH: 27.4 pg (ref 26.0–34.0)
MCHC: 31 g/dL (ref 30.0–36.0)
MCV: 88.6 fL (ref 78.0–100.0)
Platelets: 113 10*3/uL — ABNORMAL LOW (ref 150–400)
RBC: 3.32 MIL/uL — AB (ref 3.87–5.11)
RDW: 14.6 % (ref 11.5–15.5)
WBC: 9.1 10*3/uL (ref 4.0–10.5)

## 2015-12-19 LAB — BASIC METABOLIC PANEL
ANION GAP: 5 (ref 5–15)
BUN: 17 mg/dL (ref 6–20)
CALCIUM: 8.6 mg/dL — AB (ref 8.9–10.3)
CHLORIDE: 108 mmol/L (ref 101–111)
CO2: 26 mmol/L (ref 22–32)
Creatinine, Ser: 1 mg/dL (ref 0.44–1.00)
GFR calc non Af Amer: >60 mL/min (ref >60)
Glucose, Bld: 114 mg/dL — ABNORMAL HIGH (ref 65–99)
POTASSIUM: 4 mmol/L (ref 3.5–5.1)
Sodium: 139 mmol/L (ref 135–145)

## 2015-12-19 LAB — GLUCOSE, CAPILLARY: Glucose-Capillary: 102 mg/dL — ABNORMAL HIGH (ref 65–99)

## 2015-12-19 MED ORDER — FUROSEMIDE 40 MG PO TABS: 40.0000 mg | ORAL_TABLET | Freq: Every day | ORAL | Status: DC

## 2015-12-19 NOTE — Progress Notes (Signed)
12/19/2015 2:07 PM Pt called to get back in bed.  Encouraged pt to stay up a little longer or try to walk again.  Pt states that she does not want to, her right groin hurts too much when she is trying to move. Miranda Taylor

## 2015-12-19 NOTE — Progress Notes (Signed)
CARDIAC REHAB PHASE I   PRE:  Rate/Rhythm: 36 SR  BP:  Sitting: 104/67      SaO2: 95 RA  MODE:  Ambulation: 40 ft   POST:  Rate/Rhythm: 63 SR  BP:  Sitting: 107/55     SaO2: 94 RA 1130-1209 Patient ambulated in hallway x 2 assist with gaitbelt and RW. Unsteady gait noted. Patient complaining of right groin pain 8/10 and was hesitant to put pressure on right leg. No abnormality in groin or thigh assessed. Anxiety noted. Patient had to be wheeled back to room after 40 feet of ambulation. To chair with lots of encouragement. Call bell and phone in reach. Encouraged patient to walk 2 more times today. Will follow up on Monday.  Santina Evans, BSN 12/19/2015 12:10 PM

## 2015-12-19 NOTE — Progress Notes (Addendum)
      [address]       [place] [address]             [phone]        3 Days Post-Op Procedure(s) (LRB): MINIMALLY INVASIVE RESECTION OF LEFT ATRIAL MASS (Right) MINIMALLY INVASIVE MAZE PROCEDURE (N/A) TRANSESOPHAGEAL ECHOCARDIOGRAM (TEE) (N/A) CLIPPING OF ATRIAL APPENDAGE (N/A)  Subjective: Patient with incisional pain.  Objective: Vital signs in last 24 hours: Temp:  [98.5 F (36.9 C)-99.9 F (37.7 C)] 98.5 F (36.9 C) (02/11 0343) Pulse Rate:  [64-90] 66 (02/11 0604) Cardiac Rhythm:  [-] Normal sinus rhythm (02/10 2002) Resp:  [12-26] 18 (02/11 0343) BP: (64-114)/(43-78) 108/63 mmHg (02/11 0604) SpO2:  [91 %-100 %] 94 % (02/11 0343) Weight:  [195 lb 11.2 oz (88.769 kg)] 195 lb 11.2 oz (88.769 kg) (02/11 0343)  Pre op weight 89 kg Current Weight  12/19/15 195 lb 11.2 oz (88.769 kg)      Intake/Output from previous day: 02/10 0701 - 02/11 0700 In: 503.8 [P.O.:360; I.V.:143.8] Out: 1875 [Urine:1325; Chest Tube:550]   Physical Exam:  Cardiovascular: RRR, no murmur Pulmonary: Diminished at bases; no rales, wheezes, or rhonchi. Abdomen: Soft, non tender, bowel sounds present. Extremities: SCDs in place Wounds: Clean and dry.  No erythema or signs of infection. Minor ecchymosis right breast area  Lab Results: CBC: Recent Labs  12/18/15 0415 12/19/15 0311  WBC 12.4* 9.1  HGB 9.4* 9.1*  HCT 30.3* 29.4*  PLT 119* 113*   BMET:  Recent Labs  12/18/15 0415 12/19/15 0311  NA 135 139  K 4.2 4.0  CL 106 108  CO2 25 26  GLUCOSE 111* 114*  BUN 16 17  CREATININE 1.01* 1.00  CALCIUM 8.5* 8.6*    PT/INR:  Lab Results  Component Value Date   INR 1.38 12/16/2015   INR 1.04 12/14/2015   INR 1.16 11/06/2015   ABG:  INR: Will add last result for INR, ABG once components are confirmed Will add last 4 CBG results once components are confirmed  Assessment/Plan:  1. CV - SR in the 60's. On Amiodarone 200 mg bid, Lopressor 12.5 mg  bid (parameters given). Per Dr. Roxy Manns will restart Eliquis at discharge. 2.  Pulmonary - Chest tubes with 550 cc of output last 24 hours. Will remain for today. On room air. Encourage incentive spirometer. Check CXR in am. 3. Volume Overload - On Lasix 40 mg bid. Will give once today as BP somewhat labile. 4.  Acute blood loss anemia - H and H 9.1 and 29.4 this am 5. Thrombocytopenia-platelets 113,000    ZIMMERMAN,DONIELLE MPA-C 12/19/2015,7:50 AM  Patient seen and examined, agree with above  Remo Lipps C. Roxan Hockey, MD [facility] [phone]

## 2015-12-20 ENCOUNTER — Inpatient Hospital Stay (HOSPITAL_COMMUNITY): Payer: BLUE CROSS/BLUE SHIELD

## 2015-12-20 MED ORDER — LACTULOSE 10 GM/15ML PO SOLN
20.0000 g | Freq: Once | ORAL | Status: AC
  Administered 2015-12-20: 20 g via ORAL
  Filled 2015-12-20: qty 30

## 2015-12-20 MED ORDER — FUROSEMIDE 40 MG PO TABS
40.0000 mg | ORAL_TABLET | Freq: Two times a day (BID) | ORAL | Status: DC
  Administered 2015-12-20 – 2015-12-21 (×3): 40 mg via ORAL
  Filled 2015-12-20 (×3): qty 1

## 2015-12-20 NOTE — Progress Notes (Signed)
Patient's external pacer wires detached. Spoke with [place], PA who stated to go ahead and reattach. Pacer was programmed as AAI 60 but V-leads and wires were attached originally. A-leads and wires were not attached. Spoke with my charge nurse and the charge nurse on [place]. The charge nurse from [place] reattached the Ventricle wires and V-leads and programmed the box as VVI 60. The Atrial wires were paper taped to the patient.

## 2015-12-20 NOTE — Progress Notes (Signed)
Patient ambulated 150 ft with a walker. She tolerated it well.

## 2015-12-20 NOTE — Progress Notes (Addendum)
      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) MINIMALLY INVASIVE MAZE PROCEDURE (N/A) TRANSESOPHAGEAL ECHOCARDIOGRAM (TEE) (N/A) CLIPPING OF ATRIAL APPENDAGE (N/A)  Subjective: Patient with constipation  Objective: Vital signs in last 24 hours: Temp:  [97.8 F (36.6 C)-99.4 F (37.4 C)] 98 F (36.7 C) (02/12 0512) Pulse Rate:  [62-67] 62 (02/12 0512) Cardiac Rhythm:  [-] Normal sinus rhythm (02/11 2207) Resp:  [18] 18 (02/12 0512) BP: (96-126)/(44-78) 105/52 mmHg (02/12 0512) SpO2:  [97 %-100 %] 97 % (02/12 0512) Weight:  [185 lb (83.915 kg)-196 lb 1.6 oz (88.95 kg)] 185 lb (83.915 kg) (02/12 0613)  Pre op weight 89 kg Current Weight  12/20/15 185 lb (83.915 kg)      Intake/Output from previous day: 02/11 0701 - 02/12 0700 In: 363 [P.O.:360; I.V.:3] Out: 1250 [Urine:850; Chest Tube:400]   Physical Exam:  Cardiovascular: RRR, no murmur Pulmonary: Diminished at bases; no rales, wheezes, or rhonchi. Abdomen: Soft, non tender, bowel sounds present. Extremities: SCDs in place Wounds: Clean and dry.  No erythema or signs of infection. Minor ecchymosis right breast area  Lab Results: CBC:  Recent Labs  12/18/15 0415 12/19/15 0311  WBC 12.4* 9.1  HGB 9.4* 9.1*  HCT 30.3* 29.4*  PLT 119* 113*   BMET:   Recent Labs  12/18/15 0415 12/19/15 0311  NA 135 139  K 4.2 4.0  CL 106 108  CO2 25 26  GLUCOSE 111* 114*  BUN 16 17  CREATININE 1.01* 1.00  CALCIUM 8.5* 8.6*    PT/INR:  Lab Results  Component Value Date   INR 1.38 12/16/2015   INR 1.04 12/14/2015   INR 1.16 11/06/2015   ABG:  INR: Will add last result for INR, ABG once components are confirmed Will add last 4 CBG results once components are confirmed  Assessment/Plan:  1. CV - SR in the 60's. On Amiodarone 200 mg bid, Lopressor 12.5 mg bid  (parameters given). Per Dr. Roxy Manns will restart Eliquis at discharge. 2.  Pulmonary - Chest tubes with 400 cc of output last 24 hours. Will remain for today. CXR this am appears to show no pneumothorax, bibasilar atelectasis and small pleural effusions.On room air. Encourage incentive spirometer.  3. Volume Overload - On Lasix 40 mg daily and will give two times today 4.  Acute blood loss anemia - last H and H 9.1 and 29.4  5. Thrombocytopenia-platelets 113,000 6. LOC constipation   ZIMMERMAN,DONIELLE MPA-C 12/20/2015,7:57 AM

## 2015-12-21 ENCOUNTER — Inpatient Hospital Stay (HOSPITAL_COMMUNITY): Payer: BLUE CROSS/BLUE SHIELD

## 2015-12-21 LAB — CBC
HEMATOCRIT: 30.3 % — AB (ref 36.0–46.0)
HEMOGLOBIN: 9.5 g/dL — AB (ref 12.0–15.0)
MCH: 27.1 pg (ref 26.0–34.0)
MCHC: 31.4 g/dL (ref 30.0–36.0)
MCV: 86.6 fL (ref 78.0–100.0)
Platelets: 200 10*3/uL (ref 150–400)
RBC: 3.5 MIL/uL — ABNORMAL LOW (ref 3.87–5.11)
RDW: 14.1 % (ref 11.5–15.5)
WBC: 8.5 10*3/uL (ref 4.0–10.5)

## 2015-12-21 MED ORDER — FUROSEMIDE 40 MG PO TABS
40.0000 mg | ORAL_TABLET | Freq: Every day | ORAL | Status: DC
Start: 2015-12-22 — End: 2015-12-22
  Administered 2015-12-22: 40 mg via ORAL
  Filled 2015-12-21: qty 1

## 2015-12-21 MED ORDER — POTASSIUM CHLORIDE CRYS ER 20 MEQ PO TBCR
20.0000 meq | EXTENDED_RELEASE_TABLET | Freq: Every day | ORAL | Status: DC
  Administered 2015-12-21 – 2015-12-22 (×2): 20 meq via ORAL
  Filled 2015-12-21 (×2): qty 1

## 2015-12-21 MED ORDER — APIXABAN 5 MG PO TABS
5.0000 mg | ORAL_TABLET | Freq: Two times a day (BID) | ORAL | Status: DC
  Administered 2015-12-22: 5 mg via ORAL
  Filled 2015-12-21: qty 1

## 2015-12-21 NOTE — Progress Notes (Signed)
Pt disconnected from external pacer box. Wires taped and protected. Will continue to monitor.

## 2015-12-21 NOTE — Progress Notes (Signed)
Utilization review completed.  

## 2015-12-21 NOTE — Progress Notes (Signed)
EPW d/c'd per order and per protocol. Tips intact. Pt tolerated fair. Pt educated on need for vitals q15 min and bedrest for 1 hour. Vitals stable. Will continue to monitor.

## 2015-12-21 NOTE — Progress Notes (Signed)
CARDIAC REHAB PHASE I   PRE:  Rate/Rhythm: 57 SR  BP:  Sitting: 109/63        SaO2: 97 RA  MODE:  Ambulation: 150 ft   POST:  Rate/Rhythm: 74 SR  BP:  Sitting: 103/61         SaO2: 100 RA  Pt in bed, states she is waiting to have her EPW/CT removed. Pt agreeable to walk. Pt ambulated 150 ft on RA, rolling walker, chest tube, assist x1, steady gait, tolerated well. Pt denies any complaints, states she could go farther, but when reminded of need to walk x2 more today, pt quickly returned to room instead. Will attempt to increase distance tomorrow. Pt to bed after walk in anticipation of EPW removal, call bell within reach. Encouraged additional ambulation x2 today, pt verbalized understanding. Will follow.   [id number] Lenna Sciara, RN, BSN 12/21/2015 11:23 AM

## 2015-12-21 NOTE — Progress Notes (Signed)
Pt assessed with student. Agree with student assessment. Pt has call bell and phone within reach. States no further needs at this time. Will continue to monitor.

## 2015-12-21 NOTE — Progress Notes (Addendum)
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 (Right) MINIMALLY INVASIVE MAZE PROCEDURE (N/A) TRANSESOPHAGEAL ECHOCARDIOGRAM (TEE) (N/A) CLIPPING OF ATRIAL APPENDAGE (N/A) Subjective: Feels weak  Objective: Vital signs in last 24 hours: Temp:  [97.7 F (36.5 C)-98.9 F (37.2 C)] 98.9 F (37.2 C) (02/13 0513) Pulse Rate:  [59-67] 65 (02/13 0513) Cardiac Rhythm:  [-] Normal sinus rhythm (02/12 2018) Resp:  [18-20] 18 (02/13 0513) BP: (92-120)/(47-73) 92/47 mmHg (02/13 0513) SpO2:  [94 %-100 %] 94 % (02/13 0513) Weight:  [190 lb 4.8 oz (86.32 kg)] 190 lb 4.8 oz (86.32 kg) (02/13 0513)  Hemodynamic parameters for last 24 hours:    Intake/Output from previous day: 02/12 0701 - 02/13 0700 In: 600 [P.O.:600] Out: 1150 [Urine:850; Chest Tube:300] Intake/Output this shift:    General appearance: alert, cooperative and no distress Heart: regular rate and rhythm Lungs: dim in bases Abdomen: benign Extremities: no edema Wound: incis healing well  Lab Results:  Recent Labs  12/19/15 0311 12/21/15 0258  WBC 9.1 8.5  HGB 9.1* 9.5*  HCT 29.4* 30.3*  PLT 113* 200   BMET:  Recent Labs  12/19/15 0311  NA 139  K 4.0  CL 108  CO2 26  GLUCOSE 114*  BUN 17  CREATININE 1.00  CALCIUM 8.6*    PT/INR: No results for input(s): LABPROT, INR in the last 72 hours. ABG    Component Value Date/Time   PHART 7.345* 12/17/2015 0013   HCO3 19.6* 12/17/2015 0013   TCO2 21 12/17/2015 1709   ACIDBASEDEF 5.0* 12/17/2015 0013   O2SAT 99.0 12/17/2015 0013   CBG (last 3)   Recent Labs  12/18/15 2101 12/18/15 2350 12/19/15 0408  GLUCAP 133* 120* 102*    Meds Scheduled Meds: . acetaminophen  1,000 mg Oral 4 times per day  . amiodarone  200 mg Oral BID  . antiseptic oral rinse  7 mL Mouth Rinse QID  . aspirin EC  325 mg Oral Daily  .  bisacodyl  10 mg Oral Daily   Or  . bisacodyl  10 mg Rectal Daily  . chlorhexidine gluconate  15 mL Mouth Rinse BID  . desvenlafaxine  100 mg Oral QAC breakfast  . docusate sodium  200 mg Oral Daily  . enoxaparin (LOVENOX) injection  30 mg Subcutaneous Q24H  . furosemide  40 mg Oral BID  . metoprolol tartrate  12.5 mg Oral BID  . mupirocin ointment  1 application Nasal BID  . pantoprazole  40 mg Oral Daily  . potassium chloride  20 mEq Oral BID  . sodium chloride flush  3 mL Intravenous Q12H  . sodium chloride flush  3 mL Intravenous Q12H   Continuous Infusions: . 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 Surgical changes of prior left atrial  amputation. Unchanged position of right thoracostomy tube terminating towards the apex. No visualized pneumothorax on the current study. Percutaneous epicardial pacing leads remain in position. Uncertain if there is a mediastinal drain remaining. Low lung volumes with similar appearance of ill-defined opacities at the bilateral lung bases. Persistent opacification of the left hemidiaphragm. Blunting of the left costophrenic angle. IMPRESSION: Similar appearance of the chest x-ray with persisting bibasilar atelectasis and left pleural effusion. Unchanged position of right thoracostomy tube and epicardial pacing leads. Uncertain if mediastinal  drain is in position. Surgical changes of prior left atrial mass resection and left atrial amputation. Signed, Dulcy Fanny. Earleen Newport, DO [facility] Electronically Signed   By: Corrie Mckusick D.O.   On: 12/21/2015 08:04   Dg Chest Port 1 View  12/20/2015  CLINICAL DATA:  Status post left atrial mass resection. EXAM: PORTABLE CHEST 1 VIEW COMPARISON:  12/18/2015 and prior exam FINDINGS: Left atrial appendage clip and right thoracostomy tube again noted. No pneumothorax identified. Bibasilar atelectasis and very small left pleural effusion again noted. Left IJ central venous catheters have been removed. IMPRESSION: Left central venous catheter removal without other significant change. Continued bilateral lower lung atelectasis and small left pleural effusion. Electronically Signed   By: Margarette Canada M.D.   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 No air leak. Serosanguinous 2 sinus rhythm- disconnect pacer  3 platelet count has normalized. H/H is stable 4 cont diuretic, decrease to once daily   LOS: 5 days    GOLD,WAYNE E 12/21/2015  I have seen and examined the patient and agree with the assessment and plan as outlined.  D/C pacing wires and chest tubes.  Anticipate possible d/c home tomorrow.  Restart Eliquis tomorrow and stop ASA + Lovenox.  Rexene Alberts, MD 12/21/2015 9:09 AM

## 2015-12-21 NOTE — Progress Notes (Signed)
Chest tubes x2 d/c'd per order and per protocol. Vaseline gauze placed over incisions. Pt tolerated fair. Given pain medicine. Will continue to monitor.

## 2015-12-22 ENCOUNTER — Inpatient Hospital Stay (HOSPITAL_COMMUNITY): Payer: BLUE CROSS/BLUE SHIELD

## 2015-12-22 LAB — BASIC METABOLIC PANEL
ANION GAP: 10 (ref 5–15)
BUN: 14 mg/dL (ref 6–20)
CO2: 27 mmol/L (ref 22–32)
Calcium: 8.6 mg/dL — ABNORMAL LOW (ref 8.9–10.3)
Chloride: 101 mmol/L (ref 101–111)
Creatinine, Ser: 1.05 mg/dL — ABNORMAL HIGH (ref 0.44–1.00)
GFR calc Af Amer: >60 mL/min (ref >60)
GFR calc non Af Amer: 59 mL/min — ABNORMAL LOW (ref >60)
GLUCOSE: 96 mg/dL (ref 65–99)
POTASSIUM: 3.6 mmol/L (ref 3.5–5.1)
Sodium: 138 mmol/L (ref 135–145)

## 2015-12-22 MED ORDER — FUROSEMIDE 40 MG PO TABS
ORAL_TABLET | ORAL | Status: DC
Start: 2015-12-22 — End: 2016-01-01

## 2015-12-22 MED ORDER — AMIODARONE HCL 200 MG PO TABS: 200.0000 mg | ORAL_TABLET | Freq: Every day | ORAL | Status: DC

## 2015-12-22 MED ORDER — POTASSIUM CHLORIDE ER 10 MEQ PO TBCR: EXTENDED_RELEASE_TABLET | ORAL | Status: DC

## 2015-12-22 MED ORDER — METOPROLOL TARTRATE 25 MG PO TABS: 12.5000 mg | ORAL_TABLET | Freq: Two times a day (BID) | ORAL | Status: DC

## 2015-12-22 MED ORDER — ONDANSETRON 4 MG PO TBDP: 4.0000 mg | ORAL_TABLET | Freq: Three times a day (TID) | ORAL | Status: DC | PRN

## 2015-12-22 MED ORDER — OXYCODONE HCL 5 MG PO TABS: 5.0000 mg | ORAL_TABLET | ORAL | Status: DC | PRN

## 2015-12-22 MED ORDER — POTASSIUM CHLORIDE CRYS ER 20 MEQ PO TBCR
40.0000 meq | EXTENDED_RELEASE_TABLET | Freq: Once | ORAL | Status: AC
  Administered 2015-12-22: 40 meq via ORAL
  Filled 2015-12-22: qty 2

## 2015-12-22 NOTE — Progress Notes (Addendum)
      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 She would like to go home.  Objective: Vital signs in last 24 hours: Temp:  [98.1 F (36.7 C)-98.7 F (37.1 C)] 98.1 F (36.7 C) (02/14 0531) Pulse Rate:  [63-75] 69 (02/14 0531) Cardiac Rhythm:  [-] Normal sinus rhythm (02/13 1913) Resp:  [18-20] 18 (02/14 0531) BP: (98-110)/(56-65) 102/56 mmHg (02/14 0531) SpO2:  [96 %-98 %] 96 % (02/14 0531) Weight:  [189 lb 6.4 oz (85.911 kg)] 189 lb 6.4 oz (85.911 kg) (02/14 0531)  Pre op weight 89 kg Current Weight  12/22/15 189 lb 6.4 oz (85.911 kg)      Intake/Output from previous day: 02/13 0701 - 02/14 0700 In: 480 [P.O.:480] Out: -    Physical Exam:  Cardiovascular: RRR, no murmur Pulmonary: Slightly diminished at bases; no rales, wheezes, or rhonchi. Abdomen: Soft, non tender, bowel sounds present. Extremities: Trace LE edema Wounds: Clean and dry.  No erythema or signs of infection. Minor ecchymosis right breast area  Lab Results: CBC:  Recent Labs  12/21/15 0258  WBC 8.5  HGB 9.5*  HCT 30.3*  PLT 200   BMET:   Recent Labs  12/22/15 0332  NA 138  K 3.6  CL 101  CO2 27  GLUCOSE 96  BUN 14  CREATININE 1.05*  CALCIUM 8.6*    PT/INR:  Lab Results  Component Value Date   INR 1.38 12/16/2015   INR 1.04 12/14/2015   INR 1.16 11/06/2015   ABG:  INR: Will add last result for INR, ABG once components are confirmed Will add last 4 CBG results once components are confirmed  Assessment/Plan:  1. CV - SR in the 60's. On Amiodarone 200 mg bid, Lopressor 12.5 mg bid (parameters given), and  Eliquis 2.  Pulmonary - CXR this am appears to show no  pneumothorax, bibasilar atelectasis and small pleural effusions.On room air. Encourage incentive spirometer.  3. Volume Overload - On Lasix 40 mg daily  4.  Acute blood loss anemia - last H and H 9.5 and 30.3  5. Supplement potassium 6. Will discuss discharge disposition with Dr. Eddie Candle MPA-C 12/22/2015,7:58 AM  I have seen and examined the patient and agree with the assessment and plan as outlined.  D/C home today.  Will get 12 lead EKG and recheck QT prior to d/c  Rexene Alberts, MD 12/22/2015 8:16 AM

## 2015-12-22 NOTE — Progress Notes (Signed)
Ed completed with good reception. Interested in [facility] and will send referral to [place].  [id number] Miranda Taylor CES, ACSM 10:25 AM 12/22/2015

## 2015-12-22 NOTE — Care Management Note (Signed)
Case Management Note Previous CM note initiated by [facility] RN,CM  Patient Details  Name: Miranda Taylor MRN: [id number] Date of Birth: 1961-06-12  Subjective/Objective:   Pt lives with spouse who will be available to assist as needed when she is medically stable for discharge.                  Action/Plan:  Pt admitted s/p MAZE, anticipate return home with spouse-    Expected Discharge Date:  12/22/15               Expected Discharge Plan:  Home/Self Care  In-House Referral:     Discharge planning Services  CM Consult  Post Acute Care Choice:    Choice offered to:     DME Arranged:    DME Agency:     HH Arranged:    [place] Agency:     Status of Service:  Completed, signed off  Medicare Important Message Given:    Date Medicare IM Given:    Medicare IM give by:    Date Additional Medicare IM Given:    Additional Medicare Important Message give by:     If discussed at [place] of Stay Meetings, dates discussed:  12/22/15  Discharge Disposition: Home/self care   Additional Comments:  Dawayne Patricia, RN 12/22/2015, 10:47 AM

## 2015-12-23 MED FILL — Dexmedetomidine HCl in NaCl 0.9% IV Soln 400 MCG/100ML: INTRAVENOUS | Qty: 100 | Status: AC

## 2015-12-23 MED FILL — Heparin Sodium (Porcine) Inj 1000 Unit/ML: INTRAMUSCULAR | Qty: 2500 | Status: AC

## 2015-12-25 ENCOUNTER — Other Ambulatory Visit: Payer: Self-pay | Admitting: Thoracic Surgery (Cardiothoracic Vascular Surgery)

## 2015-12-25 ENCOUNTER — Ambulatory Visit: Payer: BLUE CROSS/BLUE SHIELD | Admitting: Cardiovascular Disease

## 2015-12-25 DIAGNOSIS — D151 Benign neoplasm of heart: Secondary | ICD-10-CM

## 2015-12-26 ENCOUNTER — Emergency Department (HOSPITAL_BASED_OUTPATIENT_CLINIC_OR_DEPARTMENT_OTHER): Payer: BLUE CROSS/BLUE SHIELD

## 2015-12-26 ENCOUNTER — Encounter (HOSPITAL_BASED_OUTPATIENT_CLINIC_OR_DEPARTMENT_OTHER): Payer: Self-pay

## 2015-12-26 ENCOUNTER — Inpatient Hospital Stay (HOSPITAL_BASED_OUTPATIENT_CLINIC_OR_DEPARTMENT_OTHER)
Admission: EM | Admit: 2015-12-26 | Discharge: 2016-01-02 | DRG: 205 | Disposition: A | Discharge status: Home / Self Care | Payer: BLUE CROSS/BLUE SHIELD | Attending: Thoracic Surgery (Cardiothoracic Vascular Surgery) | Admitting: Thoracic Surgery (Cardiothoracic Vascular Surgery)

## 2015-12-26 DIAGNOSIS — I5032 Chronic diastolic (congestive) heart failure: Secondary | ICD-10-CM | POA: Diagnosis present

## 2015-12-26 DIAGNOSIS — I959 Hypotension, unspecified: Secondary | ICD-10-CM | POA: Diagnosis present

## 2015-12-26 DIAGNOSIS — G43909 Migraine, unspecified, not intractable, without status migrainosus: Secondary | ICD-10-CM | POA: Diagnosis present

## 2015-12-26 DIAGNOSIS — G8929 Other chronic pain: Secondary | ICD-10-CM | POA: Diagnosis present

## 2015-12-26 DIAGNOSIS — E876 Hypokalemia: Secondary | ICD-10-CM | POA: Diagnosis present

## 2015-12-26 DIAGNOSIS — Y95 Nosocomial condition: Secondary | ICD-10-CM | POA: Diagnosis present

## 2015-12-26 DIAGNOSIS — J9589 Other postprocedural complications and disorders of respiratory system, not elsewhere classified: Secondary | ICD-10-CM | POA: Diagnosis present

## 2015-12-26 DIAGNOSIS — I4581 Long QT syndrome: Secondary | ICD-10-CM | POA: Diagnosis present

## 2015-12-26 DIAGNOSIS — K5900 Constipation, unspecified: Secondary | ICD-10-CM | POA: Diagnosis not present

## 2015-12-26 DIAGNOSIS — F418 Other specified anxiety disorders: Secondary | ICD-10-CM | POA: Diagnosis present

## 2015-12-26 DIAGNOSIS — T8182XA Emphysema (subcutaneous) resulting from a procedure, initial encounter: Secondary | ICD-10-CM | POA: Diagnosis present

## 2015-12-26 DIAGNOSIS — G8918 Other acute postprocedural pain: Secondary | ICD-10-CM | POA: Diagnosis present

## 2015-12-26 DIAGNOSIS — I110 Hypertensive heart disease with heart failure: Secondary | ICD-10-CM | POA: Diagnosis present

## 2015-12-26 DIAGNOSIS — J918 Pleural effusion in other conditions classified elsewhere: Secondary | ICD-10-CM | POA: Diagnosis present

## 2015-12-26 DIAGNOSIS — I480 Paroxysmal atrial fibrillation: Secondary | ICD-10-CM | POA: Diagnosis present

## 2015-12-26 DIAGNOSIS — B9561 Methicillin susceptible Staphylococcus aureus infection as the cause of diseases classified elsewhere: Secondary | ICD-10-CM | POA: Diagnosis present

## 2015-12-26 DIAGNOSIS — Z7901 Long term (current) use of anticoagulants: Secondary | ICD-10-CM

## 2015-12-26 DIAGNOSIS — B952 Enterococcus as the cause of diseases classified elsewhere: Secondary | ICD-10-CM | POA: Diagnosis present

## 2015-12-26 DIAGNOSIS — E663 Overweight: Secondary | ICD-10-CM | POA: Diagnosis present

## 2015-12-26 DIAGNOSIS — D649 Anemia, unspecified: Secondary | ICD-10-CM | POA: Diagnosis present

## 2015-12-26 DIAGNOSIS — R079 Chest pain, unspecified: Secondary | ICD-10-CM

## 2015-12-26 DIAGNOSIS — N390 Urinary tract infection, site not specified: Secondary | ICD-10-CM | POA: Diagnosis present

## 2015-12-26 DIAGNOSIS — D689 Coagulation defect, unspecified: Secondary | ICD-10-CM | POA: Diagnosis present

## 2015-12-26 DIAGNOSIS — M797 Fibromyalgia: Secondary | ICD-10-CM | POA: Diagnosis present

## 2015-12-26 DIAGNOSIS — Y92019 Unspecified place in single-family (private) house as the place of occurrence of the external cause: Secondary | ICD-10-CM | POA: Diagnosis not present

## 2015-12-26 DIAGNOSIS — J939 Pneumothorax, unspecified: Secondary | ICD-10-CM

## 2015-12-26 DIAGNOSIS — Z87891 Personal history of nicotine dependence: Secondary | ICD-10-CM | POA: Diagnosis not present

## 2015-12-26 DIAGNOSIS — K219 Gastro-esophageal reflux disease without esophagitis: Secondary | ICD-10-CM | POA: Diagnosis present

## 2015-12-26 DIAGNOSIS — Z8249 Family history of ischemic heart disease and other diseases of the circulatory system: Secondary | ICD-10-CM

## 2015-12-26 DIAGNOSIS — M545 Low back pain: Secondary | ICD-10-CM | POA: Diagnosis present

## 2015-12-26 DIAGNOSIS — J869 Pyothorax without fistula: Secondary | ICD-10-CM | POA: Diagnosis present

## 2015-12-26 DIAGNOSIS — Z87442 Personal history of urinary calculi: Secondary | ICD-10-CM

## 2015-12-26 DIAGNOSIS — J189 Pneumonia, unspecified organism: Secondary | ICD-10-CM | POA: Diagnosis present

## 2015-12-26 DIAGNOSIS — N179 Acute kidney failure, unspecified: Secondary | ICD-10-CM | POA: Diagnosis present

## 2015-12-26 DIAGNOSIS — R509 Fever, unspecified: Secondary | ICD-10-CM | POA: Diagnosis not present

## 2015-12-26 DIAGNOSIS — R739 Hyperglycemia, unspecified: Secondary | ICD-10-CM | POA: Diagnosis present

## 2015-12-26 DIAGNOSIS — J9811 Atelectasis: Secondary | ICD-10-CM | POA: Diagnosis present

## 2015-12-26 DIAGNOSIS — J90 Pleural effusion, not elsewhere classified: Secondary | ICD-10-CM

## 2015-12-26 DIAGNOSIS — Z6831 Body mass index (BMI) 31.0-31.9, adult: Secondary | ICD-10-CM | POA: Diagnosis not present

## 2015-12-26 DIAGNOSIS — R6521 Severe sepsis with septic shock: Secondary | ICD-10-CM | POA: Diagnosis present

## 2015-12-26 DIAGNOSIS — M4800 Spinal stenosis, site unspecified: Secondary | ICD-10-CM | POA: Diagnosis present

## 2015-12-26 DIAGNOSIS — F331 Major depressive disorder, recurrent, moderate: Secondary | ICD-10-CM | POA: Diagnosis present

## 2015-12-26 DIAGNOSIS — A419 Sepsis, unspecified organism: Secondary | ICD-10-CM | POA: Diagnosis present

## 2015-12-26 HISTORY — DX: Sepsis, unspecified organism: A41.9

## 2015-12-26 HISTORY — DX: Pleural effusion, not elsewhere classified: J90

## 2015-12-26 LAB — GLUCOSE, CAPILLARY: GLUCOSE-CAPILLARY: 105 mg/dL — AB (ref 65–99)

## 2015-12-26 LAB — CBC WITH DIFFERENTIAL/PLATELET
BASOS ABS: 0 10*3/uL (ref 0.0–0.1)
BASOS PCT: 0 %
EOS PCT: 0 %
Eosinophils Absolute: 0 10*3/uL (ref 0.0–0.7)
HEMATOCRIT: 32 % — AB (ref 36.0–46.0)
Hemoglobin: 9.9 g/dL — ABNORMAL LOW (ref 12.0–15.0)
Lymphocytes Relative: 6 %
Lymphs Abs: 1.4 10*3/uL (ref 0.7–4.0)
MCH: 27.1 pg (ref 26.0–34.0)
MCHC: 30.9 g/dL (ref 30.0–36.0)
MCV: 87.7 fL (ref 78.0–100.0)
MONO ABS: 1.2 10*3/uL — AB (ref 0.1–1.0)
Monocytes Relative: 5 %
NEUTROS ABS: 21.9 10*3/uL — AB (ref 1.7–7.7)
Neutrophils Relative %: 89 %
PLATELETS: 442 10*3/uL — AB (ref 150–400)
RBC: 3.65 MIL/uL — AB (ref 3.87–5.11)
RDW: 14.7 % (ref 11.5–15.5)
WBC: 24.6 10*3/uL — AB (ref 4.0–10.5)

## 2015-12-26 LAB — MRSA PCR SCREENING: MRSA BY PCR: NEGATIVE

## 2015-12-26 LAB — COMPREHENSIVE METABOLIC PANEL
ALBUMIN: 3 g/dL — AB (ref 3.5–5.0)
ALT: 17 U/L (ref 14–54)
ANION GAP: 11 (ref 5–15)
AST: 26 U/L (ref 15–41)
Alkaline Phosphatase: 86 U/L (ref 38–126)
BILIRUBIN TOTAL: 1 mg/dL (ref 0.3–1.2)
BUN: 26 mg/dL — ABNORMAL HIGH (ref 6–20)
CHLORIDE: 95 mmol/L — AB (ref 101–111)
CO2: 28 mmol/L (ref 22–32)
Calcium: 8.4 mg/dL — ABNORMAL LOW (ref 8.9–10.3)
Creatinine, Ser: 1.64 mg/dL — ABNORMAL HIGH (ref 0.44–1.00)
GFR calc Af Amer: 40 mL/min — ABNORMAL LOW (ref >60)
GFR, EST NON AFRICAN AMERICAN: 34 mL/min — AB (ref >60)
Glucose, Bld: 132 mg/dL — ABNORMAL HIGH (ref 65–99)
POTASSIUM: 3.3 mmol/L — AB (ref 3.5–5.1)
Sodium: 134 mmol/L — ABNORMAL LOW (ref 135–145)
TOTAL PROTEIN: 6.9 g/dL (ref 6.5–8.1)

## 2015-12-26 LAB — I-STAT CG4 LACTIC ACID, ED
LACTIC ACID, VENOUS: 1.3 mmol/L (ref 0.5–2.0)
Lactic Acid, Venous: 2.91 mmol/L (ref 0.5–2.0)

## 2015-12-26 LAB — LIPASE, BLOOD: LIPASE: 18 U/L (ref 11–51)

## 2015-12-26 LAB — URINE MICROSCOPIC-ADD ON

## 2015-12-26 LAB — URINALYSIS, ROUTINE W REFLEX MICROSCOPIC
BILIRUBIN URINE: NEGATIVE
GLUCOSE, UA: NEGATIVE mg/dL
KETONES UR: NEGATIVE mg/dL
NITRITE: NEGATIVE
PROTEIN: NEGATIVE mg/dL
Specific Gravity, Urine: 1.016 (ref 1.005–1.030)
pH: 5.5 (ref 5.0–8.0)

## 2015-12-26 LAB — PROTIME-INR
INR: 2.13 — AB (ref 0.00–1.49)
PROTHROMBIN TIME: 23.7 s — AB (ref 11.6–15.2)

## 2015-12-26 MED ORDER — OXYCODONE HCL 5 MG PO TABS
5.0000 mg | ORAL_TABLET | ORAL | Status: DC | PRN
  Administered 2015-12-26 – 2016-01-02 (×21): 5 mg via ORAL
  Filled 2015-12-26 (×21): qty 1

## 2015-12-26 MED ORDER — SODIUM CHLORIDE 0.9 % IV SOLN
INTRAVENOUS | Status: DC
  Administered 2015-12-26: 17:00:00 via INTRAVENOUS
  Administered 2015-12-27: 100 mL/h via INTRAVENOUS

## 2015-12-26 MED ORDER — SODIUM CHLORIDE 0.9 % IV BOLUS (SEPSIS)
500.0000 mL | INTRAVENOUS | Status: AC
  Administered 2015-12-26: 500 mL via INTRAVENOUS

## 2015-12-26 MED ORDER — VANCOMYCIN HCL IN DEXTROSE 1-5 GM/200ML-% IV SOLN
INTRAVENOUS | Status: AC
  Administered 2015-12-26: 1.5 g
  Filled 2015-12-26: qty 200

## 2015-12-26 MED ORDER — CYCLOSPORINE 0.05 % OP EMUL
1.0000 [drp] | Freq: Two times a day (BID) | OPHTHALMIC | Status: DC | PRN
  Filled 2015-12-26: qty 1

## 2015-12-26 MED ORDER — DEXTROSE 5 % IV SOLN
0.0000 ug/min | Freq: Once | INTRAVENOUS | Status: AC
  Administered 2015-12-26: 20 ug/min via INTRAVENOUS

## 2015-12-26 MED ORDER — PANTOPRAZOLE SODIUM 40 MG PO TBEC
80.0000 mg | DELAYED_RELEASE_TABLET | Freq: Every day | ORAL | Status: DC
  Administered 2015-12-27 – 2016-01-02 (×7): 80 mg via ORAL
  Filled 2015-12-26 (×7): qty 2

## 2015-12-26 MED ORDER — AMIODARONE HCL 200 MG PO TABS
200.0000 mg | ORAL_TABLET | Freq: Every day | ORAL | Status: DC
  Administered 2015-12-27 – 2016-01-02 (×7): 200 mg via ORAL
  Filled 2015-12-26 (×7): qty 1

## 2015-12-26 MED ORDER — VANCOMYCIN HCL 500 MG IV SOLR
INTRAVENOUS | Status: AC
  Filled 2015-12-26: qty 500

## 2015-12-26 MED ORDER — FENTANYL CITRATE (PF) 100 MCG/2ML IJ SOLN
50.0000 ug | INTRAMUSCULAR | Status: DC | PRN
  Administered 2015-12-26 – 2015-12-27 (×4): 50 ug via INTRAVENOUS
  Filled 2015-12-26 (×4): qty 2

## 2015-12-26 MED ORDER — VANCOMYCIN HCL 10 G IV SOLR
1500.0000 mg | Freq: Once | INTRAVENOUS | Status: DC
  Filled 2015-12-26: qty 1500

## 2015-12-26 MED ORDER — SODIUM CHLORIDE 0.9 % IV SOLN
INTRAVENOUS | Status: DC
  Administered 2015-12-26: 09:00:00 via INTRAVENOUS

## 2015-12-26 MED ORDER — SODIUM CHLORIDE 0.9 % IV BOLUS (SEPSIS)
1000.0000 mL | INTRAVENOUS | Status: AC
  Administered 2015-12-26: 1000 mL via INTRAVENOUS

## 2015-12-26 MED ORDER — ADULT MULTIVITAMIN W/MINERALS CH
1.0000 | ORAL_TABLET | Freq: Every day | ORAL | Status: DC
Start: 2015-12-27 — End: 2016-01-02
  Administered 2015-12-27 – 2016-01-02 (×7): 1 via ORAL
  Filled 2015-12-26 (×7): qty 1

## 2015-12-26 MED ORDER — HYDROMORPHONE HCL 1 MG/ML IJ SOLN
1.0000 mg | Freq: Once | INTRAMUSCULAR | Status: AC
  Administered 2015-12-26: 1 mg via INTRAVENOUS
  Filled 2015-12-26: qty 1

## 2015-12-26 MED ORDER — DEXTROSE 5 % IV SOLN
2.0000 g | Freq: Two times a day (BID) | INTRAVENOUS | Status: DC
  Administered 2015-12-27 (×2): 2 g via INTRAVENOUS
  Filled 2015-12-26 (×2): qty 2

## 2015-12-26 MED ORDER — SODIUM CHLORIDE 0.9 % IV SOLN: INTRAVENOUS | Status: DC

## 2015-12-26 MED ORDER — FENTANYL CITRATE (PF) 100 MCG/2ML IJ SOLN: 50.0000 ug | Freq: Once | INTRAMUSCULAR | Status: DC

## 2015-12-26 MED ORDER — SODIUM CHLORIDE 0.9 % IV BOLUS (SEPSIS)
250.0000 mL | Freq: Once | INTRAVENOUS | Status: AC
  Administered 2015-12-26: 250 mL via INTRAVENOUS

## 2015-12-26 MED ORDER — ONDANSETRON HCL 4 MG/2ML IJ SOLN
4.0000 mg | Freq: Once | INTRAMUSCULAR | Status: AC
  Administered 2015-12-26: 4 mg via INTRAVENOUS
  Filled 2015-12-26: qty 2

## 2015-12-26 MED ORDER — SODIUM CHLORIDE 0.9 % IV BOLUS (SEPSIS)
500.0000 mL | Freq: Once | INTRAVENOUS | Status: AC
  Administered 2015-12-26: 500 mL via INTRAVENOUS

## 2015-12-26 MED ORDER — CEFEPIME HCL 2 G IJ SOLR
INTRAMUSCULAR | Status: AC
  Filled 2015-12-26: qty 2

## 2015-12-26 MED ORDER — SODIUM CHLORIDE 0.9 % IV SOLN
1250.0000 mg | INTRAVENOUS | Status: DC
Start: 2015-12-27 — End: 2015-12-27
  Filled 2015-12-26: qty 1250

## 2015-12-26 MED ORDER — PHENYLEPHRINE HCL 10 MG/ML IJ SOLN
INTRAMUSCULAR | Status: AC
  Administered 2015-12-26: 10 mg
  Filled 2015-12-26: qty 1

## 2015-12-26 MED ORDER — VANCOMYCIN HCL IN DEXTROSE 1-5 GM/200ML-% IV SOLN: 1000.0000 mg | Freq: Once | INTRAVENOUS | Status: DC

## 2015-12-26 MED ORDER — ACETAMINOPHEN 325 MG PO TABS
650.0000 mg | ORAL_TABLET | ORAL | Status: DC | PRN
  Administered 2015-12-27: 650 mg via ORAL
  Filled 2015-12-26: qty 2

## 2015-12-26 MED ORDER — DEXTROSE 5 % IV SOLN
2.0000 g | Freq: Once | INTRAVENOUS | Status: AC
  Administered 2015-12-26: 2 g via INTRAVENOUS

## 2015-12-26 MED ORDER — ONDANSETRON HCL 4 MG/2ML IJ SOLN: 4.0000 mg | Freq: Four times a day (QID) | INTRAMUSCULAR | Status: DC | PRN

## 2015-12-26 MED ORDER — SODIUM CHLORIDE 0.9 % IV SOLN: 250.0000 mL | INTRAVENOUS | Status: DC | PRN

## 2015-12-26 NOTE — ED Notes (Signed)
NEO GTT INITIATED AT 20MCG/MIN PER EDP ORDERS, NBP 72/48 (58)

## 2015-12-26 NOTE — ED Notes (Signed)
Incision at rt chest appears WNL, no drainage or redness noted Two incisions near rt axilla, still have sutures in place, areas appear red in color

## 2015-12-26 NOTE — ED Notes (Signed)
Phone Hand Off report given to [facility]U

## 2015-12-26 NOTE — ED Notes (Signed)
Phone Hand Off report given to [facility] with [facility]

## 2015-12-26 NOTE — Progress Notes (Signed)
Pt arrived on the unit 1545. C/o pain on the right side.MD at the bedside. VS stable, BP wnl, NEO drip stopped, NS rate changed from 270ml to 167ml VO by MD. Surgeon consulted and assessed patient. Pain will be addressed by MD. Bed in low position, alarms are on, cal bell in reach, family at the bedside.

## 2015-12-26 NOTE — ED Provider Notes (Addendum)
CSN: [id number]     Arrival date & time 12/26/15  [id number] History   First MD Initiated Contact with Patient 12/26/15 [phone]     Chief Complaint  Patient presents with  . post op pain      (Consider location/radiation/quality/duration/timing/severity/associated sxs/prior Treatment) The history is provided by the patient.   55 year old female status post open heart surgery on February 8 at [place] by Dr. Ricard Dillon. Patient discharged home on February 14. Patient with increase right-sided chest pain site of the surgical site and particularly where the drain tubes were. Last evening. Patient's had some pain in that area but it's gotten significantly worse associated with some mild shortness of breath no fevers. Patient not able to control the pain with Percocet. On February 8 patient underwent a minimally invasive maze procedure in a minimally invasive resection of left atrial mass. Patient denies any anterior chest pain.  Past Medical History  Diagnosis Date  . GERD (gastroesophageal reflux disease)   . Chronic lower back pain   . Paroxysmal atrial fibrillation (HCC)   . Spinal stenosis   . SUI (stress urinary incontinence, female)   . History of kidney stones   . TMJ (temporomandibular joint syndrome)     LEFT SIDE--  WEAR  MOUTH GUARD  . H/O hiatal hernia   . Plantar fasciitis   . Typical atrial flutter ([place])   . 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 Heart murmur   . Hypertension   . Dysrhythmia 02/2015    afib  . Depression   . Anxiety   . Kidney stone     passed spont.  . Migraines     h/o migraines   . Arthritis     low back, degenerative spine   . S/P Minimally-invasive maze operation for atrial fibrillation 12/16/2015    Complete bilateral atrial lesion set using bipolar radiofrequency and cryothermy ablation with clipping of LA appendage via right mini thoracotomy approach  . 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 Anterior cervical decomp/discectomy fusion  04-08-2010    C4 -- C5  . Posterior fusion lumbar spine  09-16-2009    L4 -- L5  . Appendectomy  1984  . Transthoracic echocardiogram  04-03-2012    normal LVF/  ef 55-60%  . Cardiovascular stress test  04-27-2014   dr berry    normal perfusion study/  no ischemia/  ef 61%  . Lasik  1990's  . Bilateral thunb joint arthrodesis  left 2007/   right 2010  . Negative sleep study  04-15-2013  . Cystoscopy N/A 08/04/2014    Procedure: CYSTOSCOPY;  Surgeon: Ardis Hughs, MD;  Location: [facility];  Service: Urology;  Laterality: N/A;  . Pubovaginal sling N/A 08/04/2014    Procedure: BOSTON SCIENTIFIC RETRO-PUBIC MID URETHRAL SLING;  Surgeon: Ardis Hughs, MD;  [facility];  Service: Urology;  Laterality: N/A;  . Tee without cardioversion N/A 02/06/2015    Procedure: TRANSESOPHAGEAL ECHOCARDIOGRAM (TEE);  Surgeon: Lelon Perla, MD;  Location: [facility] ENDOSCOPY;  Service: Cardiovascular;  Laterality: N/A;  . Tee without cardioversion N/A 05/14/2015    Procedure: TRANSESOPHAGEAL ECHOCARDIOGRAM (TEE);  Surgeon: Sanda Klein, MD;  Location: [facility] ENDOSCOPY;  Service: Cardiovascular;  Laterality: N/A;  . Tee without cardioversion N/A 09/02/2015    Procedure: TRANSESOPHAGEAL ECHOCARDIOGRAM (TEE);  Surgeon: Sueanne Margarita, MD;  Location: [place];  Service: Cardiovascular;  Laterality: N/A;  . Tee without cardioversion N/A 09/09/2015    Procedure: TRANSESOPHAGEAL ECHOCARDIOGRAM (TEE);  Surgeon: Lelon Perla, MD;  Location: [facility] ENDOSCOPY;  Service: Cardiovascular;  Laterality: N/A;  . 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 Minimally invasive maze procedure N/A 12/16/2015    Procedure:  MINIMALLY INVASIVE MAZE PROCEDURE;  Surgeon: Rexene Alberts, MD;  Location: [place];  Service: Open Heart Surgery;  Laterality: N/A;  . Tee without cardioversion N/A 12/16/2015    Procedure: TRANSESOPHAGEAL ECHOCARDIOGRAM (TEE);  Surgeon: Rexene Alberts, MD;  Location: [facility];  Service: Open Heart Surgery;  Laterality: N/A;  . Clipping of atrial appendage N/A 12/16/2015    Procedure: CLIPPING OF ATRIAL APPENDAGE;  Surgeon: Rexene Alberts, MD;  Location: [place];  Service: Open Heart Surgery;  Laterality: N/A;   Family History  Problem Relation Age of Onset  . Hypertension    . Heart attack Father 13  . Heart disease Mother   . CAD Father 59  . Arrhythmia Father    Social History  Substance Use Topics  . Smoking status: Former Smoker -- 0.50 packs/day for 20 years    Types: Cigarettes    Quit date: 11/07/2001  . Smokeless tobacco: Never Used  . Alcohol Use: No   OB History    No data available     Review of Systems  Constitutional: Negative for fever.  HENT: Negative for congestion.   Eyes: Negative for visual disturbance.  Respiratory: Positive for shortness of breath.   Cardiovascular: Positive for chest pain. Negative for leg swelling.  Gastrointestinal: Positive for abdominal pain. Negative for nausea and vomiting.  Genitourinary: Negative for dysuria.  Musculoskeletal: Positive for back pain.  Skin: Negative for rash.  Neurological: Negative for headaches.  Hematological: Bruises/bleeds easily.  Psychiatric/Behavioral: Negative for confusion.      Allergies  Review of patient's allergies indicates no known allergies.  Home Medications   Prior to Admission medications   Medication Sig Start Date End Date Taking? Authorizing Provider  amiodarone (PACERONE) 200 MG tablet Take 1 tablet (200 mg total) by mouth daily. 12/22/15   Donielle Liston Alba, PA-C  apixaban (ELIQUIS) 5 MG TABS tablet Take 1 tablet (5 mg total) by mouth 2 (two) times daily. 10/19/15   Rhonda G  Barrett, PA-C  cycloSPORINE (RESTASIS) 0.05 % ophthalmic emulsion Place 1 drop into both eyes 2 (two) times daily as needed (for dry eyes).     Historical Provider, MD  desvenlafaxine (PRISTIQ) 50 MG 24 hr tablet Take 100 mg by mouth daily before breakfast.     Historical Provider, MD  furosemide (LASIX) 40 MG tablet For 5 days then stop. 12/22/15   Donielle Liston Alba, PA-C  LORazepam (ATIVAN) 0.5 MG tablet Take 0.5 mg by mouth at bedtime.     Historical Provider, MD  metoprolol tartrate (LOPRESSOR) 25 MG tablet Take 0.5 tablets (12.5 mg total) by mouth 2 (two) times daily. 12/22/15   Donielle Liston Alba, PA-C  Multiple Vitamin (MULITIVITAMIN WITH MINERALS) TABS Take 1 tablet by mouth daily.    Historical Provider, MD  NEXIUM 40 MG capsule Take 40 mg by mouth daily as needed (for heartburn).  06/25/12   Historical Provider, MD  ondansetron (ZOFRAN ODT) 4 MG disintegrating tablet Take 1 tablet (4 mg total) by  mouth every 8 (eight) hours as needed for nausea or vomiting. 12/22/15   Donielle Liston Alba, PA-C  oxyCODONE (OXY IR/ROXICODONE) 5 MG immediate release tablet Take 1-2 tablets (5-10 mg total) by mouth every 4 (four) hours as needed for severe pain. 12/22/15   Donielle Liston Alba, PA-C  polyethylene glycol powder (GLYCOLAX/MIRALAX) powder Take 17 g by mouth every other day. 10/29/15   Historical Provider, MD  potassium chloride (K-DUR) 10 MEQ tablet For 5 days then stop. 12/22/15   Donielle Liston Alba, PA-C  rizatriptan (MAXALT-MLT) 10 MG disintegrating tablet Take 10 mg by mouth daily as needed for migraine.  02/26/13   Historical Provider, MD  triamcinolone cream (KENALOG) 0.1 % Apply 1 application topically daily as needed (for rash). Apply after bathing 10/22/15   Historical Provider, MD   BP 87/49 mmHg  Pulse 60  Temp(Src) 98.1 F (36.7 C) (Oral)  Resp 20  Ht 5\' 7"  (1.702 m)  Wt 83.008 kg  BMI 28.66 kg/m2  SpO2 94%  LMP 12/25/2012 Physical Exam  Constitutional: She is oriented to  person, place, and time. She appears well-developed and well-nourished. No distress.  HENT:  Head: Normocephalic and atraumatic.  Mouth/Throat: Oropharynx is clear and moist.  Eyes: Conjunctivae and EOM are normal. Pupils are equal, round, and reactive to light.  Neck: Normal range of motion. Neck supple.  Cardiovascular: Normal rate, regular rhythm, normal heart sounds and intact distal pulses.   Pulmonary/Chest: Effort normal and breath sounds normal. No respiratory distress. She has no wheezes. She has no rales. She exhibits tenderness.  Surgical wound on the right side of the chest without evidence of infection. Drain tubes wounds down on the inferior aspect of the right chest without evidence of infection. Tender to palpate along the lower rib margin of the chest and right upper quadrant.  Abdominal: Soft. Bowel sounds are normal. She exhibits no distension. There is tenderness.  Tenderness palpation right upper quadrant.  Musculoskeletal: Normal range of motion. She exhibits no edema.  Neurological: She is alert and oriented to person, place, and time. No cranial nerve deficit. She exhibits normal muscle tone. Coordination normal.  Skin: Skin is warm. No rash noted.  Nursing note and vitals reviewed.   ED Course  Procedures (including critical care time) Labs Review Labs Reviewed  CBC WITH DIFFERENTIAL/PLATELET - Abnormal; Notable for the following:    WBC 24.6 (*)    RBC 3.65 (*)    Hemoglobin 9.9 (*)    HCT 32.0 (*)    Platelets 442 (*)    Neutro Abs 21.9 (*)    Monocytes Absolute 1.2 (*)    All other components within normal limits  COMPREHENSIVE METABOLIC PANEL - Abnormal; Notable for the following:    Sodium 134 (*)    Potassium 3.3 (*)    Chloride 95 (*)    Glucose, Bld 132 (*)    BUN 26 (*)    Creatinine, Ser 1.64 (*)    Calcium 8.4 (*)    Albumin 3.0 (*)    GFR calc non Af Amer 34 (*)    GFR calc Af Amer 40 (*)    All other components within normal limits   PROTIME-INR - Abnormal; Notable for the following:    Prothrombin Time 23.7 (*)    INR 2.13 (*)    All other components within normal limits  CULTURE, BLOOD (ROUTINE X 2)  CULTURE, BLOOD (ROUTINE X 2)  URINE CULTURE  LIPASE, BLOOD  URINALYSIS, ROUTINE W REFLEX  MICROSCOPIC (NOT AT [facility])  I-STAT CG4 LACTIC ACID, ED  I-STAT CG4 LACTIC ACID, ED  I-STAT CG4 LACTIC ACID, ED   Results for orders placed or performed during the hospital encounter of 12/26/15  CBC with Differential/Platelet  Result Value Ref Range   WBC 24.6 (H) 4.0 - 10.5 K/uL   RBC 3.65 (L) 3.87 - 5.11 MIL/uL   Hemoglobin 9.9 (L) 12.0 - 15.0 g/dL   HCT 32.0 (L) 36.0 - 46.0 %   MCV 87.7 78.0 - 100.0 fL   MCH 27.1 26.0 - 34.0 pg   MCHC 30.9 30.0 - 36.0 g/dL   RDW 14.7 11.5 - 15.5 %   Platelets 442 (H) 150 - 400 K/uL   Neutrophils Relative % 89 %   Neutro Abs 21.9 (H) 1.7 - 7.7 K/uL   Lymphocytes Relative 6 %   Lymphs Abs 1.4 0.7 - 4.0 K/uL   Monocytes Relative 5 %   Monocytes Absolute 1.2 (H) 0.1 - 1.0 K/uL   Eosinophils Relative 0 %   Eosinophils Absolute 0.0 0.0 - 0.7 K/uL   Basophils Relative 0 %   Basophils Absolute 0.0 0.0 - 0.1 K/uL  Comprehensive metabolic panel  Result Value Ref Range   Sodium 134 (L) 135 - 145 mmol/L   Potassium 3.3 (L) 3.5 - 5.1 mmol/L   Chloride 95 (L) 101 - 111 mmol/L   CO2 28 22 - 32 mmol/L   Glucose, Bld 132 (H) 65 - 99 mg/dL   BUN 26 (H) 6 - 20 mg/dL   Creatinine, Ser 1.64 (H) 0.44 - 1.00 mg/dL   Calcium 8.4 (L) 8.9 - 10.3 mg/dL   Total Protein 6.9 6.5 - 8.1 g/dL   Albumin 3.0 (L) 3.5 - 5.0 g/dL   AST 26 15 - 41 U/L   ALT 17 14 - 54 U/L   Alkaline Phosphatase 86 38 - 126 U/L   Total Bilirubin 1.0 0.3 - 1.2 mg/dL   GFR calc non Af Amer 34 (L) >60 mL/min   GFR calc Af Amer 40 (L) >60 mL/min   Anion gap 11 5 - 15  Lipase, blood  Result Value Ref Range   Lipase 18 11 - 51 U/L  Protime-INR  Result Value Ref Range   Prothrombin Time 23.7 (H) 11.6 - 15.2 seconds   INR 2.13  (H) 0.00 - 1.49  I-Stat CG4 Lactic Acid, ED  (not at  [facility])  Result Value Ref Range   Lactic Acid, Venous 1.30 0.5 - 2.0 mmol/L     Imaging Review Dg Chest 2 View  12/26/2015  CLINICAL DATA:  Initial encounter. 55 y/o female with recent heart surgery (removed of benign tumor) that was discharged Tuesday. Here with c/o SOB, fever, nausea. Right chest pain with redness, probably from chest tubes Hx arrythmia, ex-smoker EXAM: CHEST  2 VIEW COMPARISON:  12/22/2015 FINDINGS: There is increased opacity at the right lung base now obscuring the right hemidiaphragm. Opacity at the left lung base is similar to the prior study consistent with a combination of pleural fluid and atelectasis. There is no convincing pulmonary edema. No pneumothorax. Cardiac silhouette is top-normal in size. Left atrial occlusion ir device is stable. IMPRESSION: 1. New opacity at the right lung base. This is likely combination of a small effusion with either atelectasis or pneumonia or a combination. 2. No other change from the prior study. Persistent small left pleural effusion with associated atelectasis. No pulmonary edema. Electronically Signed   By: Dedra Skeens.D.  On: 12/26/2015 10:12   Ct Chest Wo Contrast  12/26/2015  CLINICAL DATA:  Recent heart surgery 12/16/15 EXAM: CT CHEST WITHOUT CONTRAST TECHNIQUE: Multidetector CT imaging of the chest was performed following the standard protocol without IV contrast. COMPARISON:  Current chest radiograph.  Chest CTA, 11/11/2015. FINDINGS: Neck base and axilla: Small thyroid nodules several calcified. No neck base or axillary masses or adenopathy. Mediastinum and hila: Heart normal in size. Left atrial occlusion device is well positioned. Mild mediastinal adenopathy. There is a 12 mm short axis right peritracheal node and a 12 mm precarinal node. No hilar masses or discrete enlarged lymph nodes. Lungs and pleura: There are areas of loculated pleural fluid on the right. Loculated  pleural fluid is seen anteriorly and along the oblique fissure. There is loculated posterior medial pleural fluid which is contiguous with more free fluid at the posterior lung base. Opacity in the right lower lobe is most likely all atelectasis. There is milder atelectasis in the right middle lobe and dependent right upper lobe. There is a small free flowing left pleural effusion. Left lower lobe opacity is also noted likely atelectasis. There is mild hazy opacity in the left upper lobe mild bilateral interstitial prominence. There is no convincing pneumothorax. There is right anterior chest wall subcutaneous emphysema that extends toward the right axilla. Limited upper abdomen: Upper pole right renal cyst. Otherwise unremarkable. IMPRESSION: 1. New opacity on the current chest radiograph is due to a combination of loculated pleural fluid on the right and right lung, primarily right lower lobe, atelectasis. A component of pneumonia is possible but felt less likely. 2. Small left pleural effusion. Partial left lower lobe atelectasis. 3. Mild interstitial prominence without overt pulmonary edema. 4. No pneumothorax. Right anterior chest wall subcutaneous emphysema. Electronically Signed   By: Lajean Manes M.D.   On: 12/26/2015 10:51   US Abdomen Complete  12/26/2015  CLINICAL DATA:  Diffuse abdominal discomfort s/p heart surgery to removed a benign mass 1 week ago. Pt states she has been nauseated since surgery and has had no appetite. Pt states most of her pain is centered around the chest tube site on the right under the right breast. EXAM: ABDOMEN ULTRASOUND COMPLETE COMPARISON:  CT, 11/11/2015 FINDINGS: Gallbladder: No gallstones or wall thickening visualized. No sonographic Murphy sign noted by sonographer. Common bile duct: Diameter: 5.5 mm Liver: Normal in size and echogenicity.  No mass or focal lesion. IVC: No abnormality visualized. Pancreas: Suboptimally visualized.  Portions seen are unremarkable.  Spleen: Size and appearance within normal limits. Right Kidney: Length: 14.4 cm. Normal parenchymal echogenicity. Mild cortical thinning. Two cysts, 1 from the upper pole measuring 5.8 cm and 1 in the lower pole measuring 4.9 cm. No hydronephrosis. Left Kidney: Length: 12.0 cm. Echogenicity within normal limits. No mass or hydronephrosis visualized. Abdominal aorta: No aneurysm visualized. Other findings: None. IMPRESSION: 1. No acute findings. No evidence acute cholecystitis or gallstones. No bile duct dilation. 2. Right renal cysts. Electronically Signed   By: Lajean Manes M.D.   On: 12/26/2015 12:56   I have personally reviewed and evaluated these images and lab results as part of my medical decision-making.   EKG Interpretation   Date/Time:  Saturday December 26 2015 09:23:24 EST Ventricular Rate:  68 PR Interval:  181 QRS Duration: 102 QT Interval:  616 QTC Calculation: 655 R Axis:   38 Text Interpretation:  Sinus rhythm Low voltage, precordial leads  Borderline repolarization abnormality Prolonged QT interval Baseline  wander in  lead(s) II III aVF Confirmed by   MD,  [phone]) on  12/26/2015 9:48:45 AM        CRITICAL CARE Performed by: Fredia Sorrow Total critical care time: 45  minutes Critical care time was exclusive of separately billable procedures and treating other patients. Critical care was necessary to treat or prevent imminent or life-threatening deterioration. Critical care was time spent personally by me on the following activities: development of treatment plan with patient and/or surrogate as well as nursing, discussions with consultants, evaluation of patient's response to treatment, examination of patient, obtaining history from patient or surrogate, ordering and performing treatments and interventions, ordering and review of laboratory studies, ordering and review of radiographic studies, pulse oximetry and re-evaluation of patient's  condition.    MDM   Final diagnoses:  Sepsis, due to unspecified organism [facility])    Patient status post open heart surgery to the right side of her chest on February 8 for removal of a small atrial mass and for ablation for atrial fibrillation. This was done by Dr. Ricard Dillon. Patient was discharged home on February 14. Last evening patient started with significant increase in pain in the right lower part of chest right upper quadrant part of the abdomen. Patient states that her blood pressures are normally in the low 100s. However we had a mixed picture of hypotension and normal pressures. X-ray showed fluid on the right side questionable pneumonia. CT of chest on that side confirmed of fluid small amount and raise concern for pneumonia. However not definitive. Patient's not febrile white count has increased significantly from time of discharge. It was 8.5 on February 13 and today is 24.6. Patient was cultured started on antibiotics for hospital-acquired pneumonia. Patient had septic orders started lactic acid was normal. Patient received the 2.5 L of fluid with improvement in her blood pressure systolics went up to 98. Patient receiving an additional 500 mL of fluid. Blood pressure started to decrease again most recent systolic was 81. Patient is alert and oriented not hypoxic oxygen levels are normal.  Patient will be started on Neo-Synephrine. Will need to be transferred to the ICU at [facility]. Discussed with on-call cardiothoracic surgeon Dr. Lucianne Lei trite he is aware of the patient and will consult. We'll discuss with the critical care for admission.  In addition to the right upper quadrant pain ultrasound of the abdomen was done without any significant abnormalities. Liver function tests were normal as well to.   Patient chest pain is worse with movement and palpitation. Patient has not had pulmonary embolus ruled out because she never was hypoxic. Not tachycardic but that's probably mostly due to her  medications. As stated pulmonary embolus has not been completely ruled out.  Patient blood pressure responded well to the Neo-Synephrine drip. Systolic blood pressures came up above 90. Last blood pressure was 99/65. Patient's repeat lactic acid was elevated at 2.9. This makes it most likely that sepsis is the diagnosis that there is some infection in that lung area. [facility] your transport patient to ICU.  Fredia Sorrow, MD 12/26/15 [place], MD 12/26/15 [place], MD 12/26/15 [place], MD 12/26/15 1455

## 2015-12-26 NOTE — Progress Notes (Signed)
UR Completed.  , RN, BSN.  [phone] 

## 2015-12-26 NOTE — ED Notes (Signed)
Patient transported to X-ray via stretcher, sr x 2 up

## 2015-12-26 NOTE — Progress Notes (Addendum)
CONSULT NOTE - Initial Consult  Pharmacy Consult for heparin  Indication: atrial fibrillation, PNA  No Known Allergies  Patient Measurements: Height: 5\' 7"  (170.2 cm) Weight: 183 lb (83.008 kg) IBW/kg (Calculated) : 61.6 Heparin Dosing Weight:   Vital Signs: Temp: 98.2 F (36.8 C) (02/18 1600) Temp Source: Oral (02/18 1600) BP: 105/92 mmHg (02/18 1600) Pulse Rate: 73 (02/18 1600)  Labs:  Recent Labs  12/26/15 0925  HGB 9.9*  HCT 32.0*  PLT 442*  LABPROT 23.7*  INR 2.13*  CREATININE 1.64*    Estimated Creatinine Clearance: 43.5 mL/min (by C-G formula based on Cr of 1.64).   Medical History: Past Medical History  Diagnosis Date  . GERD (gastroesophageal reflux disease)   . Chronic lower back pain   . Paroxysmal atrial fibrillation (HCC)   . Spinal stenosis   . SUI (stress urinary incontinence, female)   . History of kidney stones   . TMJ (temporomandibular joint syndrome)     LEFT SIDE--  WEAR  MOUTH GUARD  . H/O hiatal hernia   . Plantar fasciitis   . Typical atrial flutter ([place])   . 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 Heart murmur   . Hypertension   . Dysrhythmia 02/2015    afib  . Depression   . Anxiety   . Migraines     h/o migraines   . Arthritis     low back, degenerative spine   . S/P Minimally-invasive maze operation for atrial fibrillation 12/16/2015    Complete bilateral atrial lesion set using bipolar radiofrequency and cryothermy ablation with clipping of LA appendage via right mini thoracotomy approach  . 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 amiodarone (PACERONE) 200 MG tablet Take 1 tablet (200 mg total) by mouth daily. 30 tablet 1   . apixaban (ELIQUIS) 5 MG TABS tablet Take 1 tablet (5 mg total) by mouth 2 (two) times daily. 180 tablet 3 12/09/2015 at Unknown time  . cycloSPORINE (RESTASIS) 0.05 %  ophthalmic emulsion Place 1 drop into both eyes 2 (two) times daily as needed (for dry eyes).    12/15/2015 at Unknown time  . desvenlafaxine (PRISTIQ) 50 MG 24 hr tablet Take 100 mg by mouth daily before breakfast.    12/16/2015 at 0500  . furosemide (LASIX) 40 MG tablet For 5 days then stop. 30 tablet 3   . LORazepam (ATIVAN) 0.5 MG tablet Take 0.5 mg by mouth at bedtime.    12/15/2015 at 2200  . metoprolol tartrate (LOPRESSOR) 25 MG tablet Take 0.5 tablets (12.5 mg total) by mouth 2 (two) times daily. 30 tablet 1   . Multiple Vitamin (MULITIVITAMIN WITH MINERALS) TABS Take 1 tablet by mouth daily.   12/15/2015 at Unknown time  . NEXIUM 40 MG capsule Take 40 mg by mouth daily as needed (for heartburn).    Past Week at Unknown time  . ondansetron (ZOFRAN ODT) 4 MG disintegrating tablet Take 1 tablet (4 mg total) by mouth every 8 (eight) hours as needed for nausea or vomiting. 10 tablet 0   . oxyCODONE (OXY IR/ROXICODONE) 5 MG immediate release tablet Take 1-2 tablets (5-10 mg total) by mouth every 4 (four) hours as needed for severe pain. 30 tablet 0   . polyethylene glycol powder (GLYCOLAX/MIRALAX) powder Take 17 g by mouth every other day.   Past Week at Unknown time  .  potassium chloride (K-DUR) 10 MEQ tablet For 5 days then stop. 30 tablet 3   . rizatriptan (MAXALT-MLT) 10 MG disintegrating tablet Take 10 mg by mouth daily as needed for migraine.    More than a month at Unknown time  . triamcinolone cream (KENALOG) 0.1 % Apply 1 application topically daily as needed (for rash). Apply after bathing  0 Past Month at Unknown time   Scheduled:  . sodium chloride   Intravenous STAT  . [START ON 12/27/2015] amiodarone  200 mg Oral Daily  . [START ON 12/27/2015] ceFEPime (MAXIPIME) IV  2 g Intravenous Q12H  . ceFEPIme      . fentaNYL (SUBLIMAZE) injection  50 mcg Intravenous Once  . [START ON 12/27/2015] multivitamin with minerals  1 tablet Oral Daily  . [START ON 12/27/2015] pantoprazole  80 mg Oral Q1200   . [START ON 12/27/2015] vancomycin  1,250 mg Intravenous Q24H  . vancomycin  1,500 mg Intravenous Once  . vancomycin        Assessment: 55 yo female here with CP and noted with afib (s/p resection of left atrial aass and MAZE on 2/8). She is on apixiban PTA and to begin heparin. She is noted with R pleural effusin and for possible CT guided sampling. -last dose of apixiban is not known  Spoke to Noe Gens NP: plans will be to start heparin in am with possible procedure today   Goal of Therapy:  APTT= 66=102 Heparin level 0.3-0.7 units/ml Monitor platelets by anticoagulation protocol: Yes   Plan:  -Baseline heparin level and aPTT in am -Will follow plans for heparin dosing in am with MD  Hildred Laser, Pharm D 12/26/2015 4:34 PM

## 2015-12-26 NOTE — ED Notes (Signed)
Patient transported to X-ray 

## 2015-12-26 NOTE — Progress Notes (Signed)
Pharmacy Antibiotic Note  Miranda Taylor is a 55 y.o. female admitted on 12/26/2015 with pneumonia.  Pharmacy has been consulted for cefepime and vancomycin dosing. SCr up to 1.64, CrCl ~20ml/min. Afebrile, WBC up to 24.6.  Plan: Give cefepime 2g IV x 1, then start cefepime 2g IV Q12 Give vancomycin 1.5g IV x 1, then start vancomycin 1,250mg  IV Q24 Monitor clinical picture, renal function, VT prn F/U C&S, abx deescalation / LOT    Height: 5\' 7"  (170.2 cm) Weight: 183 lb (83.008 kg) IBW/kg (Calculated) : 61.6  Temp (24hrs), Avg:98.1 F (36.7 C), Min:98.1 F (36.7 C), Max:98.1 F (36.7 C)   Recent Labs Lab 12/21/15 0258 12/22/15 0332 12/26/15 0925  WBC 8.5  --  24.6*  CREATININE  --  1.05* 1.64*    Estimated Creatinine Clearance: 43.5 mL/min (by C-G formula based on Cr of 1.64).    No Known Allergies  Antimicrobials this admission: Cefepime  2/18 >>  Vancomycin 2/18 >>   Dose adjustments this admission: n/a  Microbiology results: Blood cx 2/18 > sent Urine cx 2/18 > sent  Thank you for allowing pharmacy to be a part of this patient's care.  Elenor Quinones, PharmD, BCPS Clinical Pharmacist Pager [phone] 12/26/2015 11:12 AM

## 2015-12-26 NOTE — ED Notes (Signed)
On cont cardiac monitoring with q54min VS assessment

## 2015-12-26 NOTE — ED Notes (Addendum)
Patient here with right sided chest/rib pain after having a tumor removed from back side of heart and MAZE procedure. Went home Tuesday and now having increased pain. Taking oxycodone as prescribed, no distress

## 2015-12-26 NOTE — Progress Notes (Signed)
Pt has surgical incisions:  Rt upper chest(painful, some bruising, clean, no redness,),  Under Rt breast (painful, slight redness, hardening and bruising noted) Rt groin (redness, hardening noted) MD assessed and aware

## 2015-12-26 NOTE — ED Notes (Signed)
Ultrasound being done at bedside per EDP orders

## 2015-12-26 NOTE — ED Notes (Signed)
IVF of 1liter NS to Left AC at 259ml/hr via infusion pump

## 2015-12-26 NOTE — ED Notes (Signed)
UA and Lactic Acid obtained

## 2015-12-26 NOTE — ED Notes (Signed)
Takes Oxycodone for pain, took 2 tabs at 0530hrs today, no relief. States called the on-call MD, was told to go to ED for evaluation.

## 2015-12-26 NOTE — ED Notes (Signed)
MD at bedside. 

## 2015-12-26 NOTE — ED Notes (Signed)
PLACED ON 2LPM VIA [place] PER EDP ORDERS

## 2015-12-26 NOTE — H&P (Signed)
PULMONARY / CRITICAL CARE MEDICINE   Name: Miranda Taylor MRN: [id number] DOB: Nov 10, 1960    ADMISSION DATE:  12/26/2015 CONSULTATION DATE:  12/26/15  REFERRING MD:  Dr. Rogene Houston   CHIEF COMPLAINT:  Right sided chest pain, Hx MAZE procedure 2/8   HISTORY OF PRESENT ILLNESS:   55 y/o F, former smoker, with PMH of GERD, chronic low back pain, spinal stenosis, stress urinary incontinence, renal calculi, TMJ, hiatal hernia, depression, anxiety, heart murmur, HTN, and atrial fibrillation s/p MAZE procedure with resection of papillary fibroelastoma of the heart on 12/16/15 who presented to [facility] with right sided chest pain.    Originally, the patient was found to have what was thought to be a left atrial thrombus in July 2016. After a series of planned TEE's it was ultimately determined that it was a mass in the left atrial appendage. She was referred to [facility] for surgical evaluation.  On 2/8, she underwent Minimally Invasive Resection of Left Atrial Mass and MAZE procedure.  Post operative course was uncomplicated.  Pathology was consistent with fibroelastoma.  She had residual small bilateral pleural effusions.  Chest tubes were removed without difficulty.  She was discharged on 2/14 on amiodarone, eliquis BID and lopressor.    On 2/18, the patient reported she began having increased right sided chest pain from the chest tube site that was progressive in nature.  She also had mild shortness of breath.  Initial ER evaluation notable for - Temp 98.1, HR 73, BP 101/55 and 95% on room air.  She developed hypotension in the ER with systolic pressures falling to the 60's.  Labs - Na 134, K 3.3, Cl 95, BUN 26, Sr Cr 1.64 (up from 1.05), glucose 132, WBC 24.6 (up from 8.5 at discharge), Hgb 9.9, and platelets 442.  Initial CXR was concerning for increased opacity in the right lung, small left effusion unchanged.  This was further evaluated with a CT of the Chest w/o contrast that demonstrated loculated  pleural fluid on right, primarily RLL, atelectasis, small left pleural effusion, L lower atelectasis, no PTX, R anterior chest wall subcutaneous emphysema.    PCCM consulted for admission.       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  PAST SURGICAL HISTORY: She  has past surgical history that includes Anterior cervical decomp/discectomy fusion (04-08-2010); Posterior fusion lumbar spine (09-16-2009); Appendectomy (1984); transthoracic echocardiogram (04-03-2012); Cardiovascular stress test (04-27-2014   dr berry); LASIK (1990's); BILATERAL THUNB JOINT ARTHRODESIS (left 2007/   right 2010); NEGATIVE SLEEP STUDY (04-15-2013); Cystoscopy (N/A, 08/04/2014); Pubovaginal sling (N/A, 08/04/2014); TEE without cardioversion (N/A, 02/06/2015); TEE without cardioversion (N/A, 05/14/2015); TEE without cardioversion (N/A, 09/02/2015); TEE without cardioversion (N/A, 09/09/2015); Minimally invasive excision of atrial myxoma (Right, 12/16/2015); Minimally invasive maze procedure (N/A, 12/16/2015); TEE without cardioversion (N/A, 12/16/2015); and Clipping of atrial appendage (N/A, 12/16/2015).  No Known Allergies  No current facility-administered medications on file prior to encounter.   Current Outpatient Prescriptions on File Prior to Encounter  Medication Sig  . amiodarone (PACERONE) 200 MG tablet Take  1 tablet  (200 mg total) by mouth daily.  Marland Kitchen apixaban (ELIQUIS) 5 MG TABS tablet Take 1 tablet (5 mg total) by mouth 2 (two) times daily.  . cycloSPORINE (RESTASIS) 0.05 % ophthalmic emulsion Place 1 drop into both eyes 2 (two) times daily as needed (for dry eyes).   Marland Kitchen desvenlafaxine (PRISTIQ) 50 MG 24 hr tablet Take 100 mg by mouth daily before breakfast.   . furosemide (LASIX) 40 MG tablet For 5 days then stop.  Marland Kitchen LORazepam (ATIVAN) 0.5 MG tablet Take 0.5 mg by mouth at bedtime.   . metoprolol tartrate (LOPRESSOR) 25 MG tablet Take 0.5 tablets (12.5 mg total) by mouth 2 (two) times daily.  . Multiple Vitamin (MULITIVITAMIN WITH MINERALS) TABS Take 1 tablet by mouth daily.  Marland Kitchen NEXIUM 40 MG capsule Take 40 mg by mouth daily as needed (for heartburn).   . ondansetron (ZOFRAN ODT) 4 MG disintegrating tablet Take 1 tablet (4 mg total) by mouth every 8 (eight) hours as needed for nausea or vomiting.  Marland Kitchen oxyCODONE (OXY IR/ROXICODONE) 5 MG immediate release tablet Take 1-2 tablets (5-10 mg total) by mouth every 4 (four) hours as needed for severe pain.  . polyethylene glycol powder (GLYCOLAX/MIRALAX) powder Take 17 g by mouth every other day.  . potassium chloride (K-DUR) 10 MEQ tablet For 5 days then stop.  . rizatriptan (MAXALT-MLT) 10 MG disintegrating tablet Take 10 mg by mouth daily as needed for migraine.   . triamcinolone cream (KENALOG) 0.1 % Apply 1 application topically daily as needed (for rash). Apply after bathing    FAMILY HISTORY:  Her indicated that her mother is alive. She indicated that her father is deceased. She indicated that her brother is alive.   SOCIAL HISTORY: She  reports that she quit smoking about 14 years ago. Her smoking use included Cigarettes. She has a 10 pack-year smoking history. She has never used smokeless tobacco. She reports that she does not drink alcohol or use illicit drugs.  REVIEW OF SYSTEMS:   Gen: Denies weight change, fatigue, night sweats.  Patient reports  subjective fevers, chills HEENT: Denies blurred vision, double vision, hearing loss, tinnitus, sinus congestion, rhinorrhea, sore throat, neck stiffness, dysphagia PULM: Denies cough, sputum production, hemoptysis, wheezing.  Pt reports right sided chest pain, pain at insertion site of chest tubes and mild shortness of breath with exertion, small amount of clear drainage from chest tube sites with coughing CV: Denies chest pain, edema, orthopnea, paroxysmal nocturnal dyspnea, palpitations GI: Denies abdominal pain, vomiting, diarrhea, hematochezia, melena, constipation, change in bowel habits.  Reports nausea. GU: Denies dysuria, hematuria, polyuria, oliguria, urethral discharge Endocrine: Denies hot or cold intolerance, polyuria, polyphagia or appetite change Derm: Denies rash, dry skin, scaling or peeling skin change.  Reports hard red area in R groin (prior cannulation site) Heme: Denies easy bruising, bleeding, bleeding gums Neuro: Denies headache, numbness, weakness, slurred speech, loss of memory or consciousness   SUBJECTIVE:  Pt reports ongoing chest discomfort  VITAL SIGNS: BP 87/49 mmHg  Pulse 60  Temp(Src) 98.1 F (36.7 C) (Oral)  Resp 20  Ht 5\' 7"  (1.702 m)  Wt 183 lb (83.008 kg)  BMI 28.66 kg/m2  SpO2 94%  LMP 12/25/2012  HEMODYNAMICS:    VENTILATOR SETTINGS:    INTAKE / OUTPUT:    PHYSICAL EXAMINATION: General:  Well developed female in NAD, appears uncomfortable in bed Neuro:  AAOx4, speech clear, MAE HEENT:  MM pink/dry, no jvd Cardiovascular:  s1s2 rrr, no m/r/g, SB  to SR on monitor  Lungs:  Even/non-labored, clear bilaterally  Abdomen:  Obese/soft, bsx4 active  Musculoskeletal:  No acute deformities  Skin:  R groin incision erythematous, small area of induration, no drainage from site.  R anterior chest incision well approximated, no erythema or drainage, R lateral chest tube sites with mild erythema, no drainage from site   LABS:  BMET  Recent  Labs Lab 12/22/15 0332 12/26/15 0925  NA 138 134*  K 3.6 3.3*  CL 101 95*  CO2 27 28  BUN 14 26*  CREATININE 1.05* 1.64*  GLUCOSE 96 132*    Electrolytes  Recent Labs Lab 12/22/15 0332 12/26/15 0925  CALCIUM 8.6* 8.4*    CBC  Recent Labs Lab 12/21/15 0258 12/26/15 0925  WBC 8.5 24.6*  HGB 9.5* 9.9*  HCT 30.3* 32.0*  PLT 200 442*    Coag's  Recent Labs Lab 12/26/15 0925  INR 2.13*    Sepsis Markers  Recent Labs Lab 12/26/15 1122  LATICACIDVEN 1.30    ABG No results for input(s): PHART, PCO2ART, PO2ART in the last 168 hours.  Liver Enzymes  Recent Labs Lab 12/26/15 0925  AST 26  ALT 17  ALKPHOS 86  BILITOT 1.0  ALBUMIN 3.0*    Cardiac Enzymes No results for input(s): TROPONINI, PROBNP in the last 168 hours.  Glucose No results for input(s): GLUCAP in the last 168 hours.  Imaging Dg Chest 2 View  12/26/2015  CLINICAL DATA:  Initial encounter. 55 y/o female with recent heart surgery (removed of benign tumor) that was discharged Tuesday. Here with c/o SOB, fever, nausea. Right chest pain with redness, probably from chest tubes Hx arrythmia, ex-smoker EXAM: CHEST  2 VIEW COMPARISON:  12/22/2015 FINDINGS: There is increased opacity at the right lung base now obscuring the right hemidiaphragm. Opacity at the left lung base is similar to the prior study consistent with a combination of pleural fluid and atelectasis. There is no convincing pulmonary edema. No pneumothorax. Cardiac silhouette is top-normal in size. Left atrial occlusion ir device is stable. IMPRESSION: 1. New opacity at the right lung base. This is likely combination of a small effusion with either atelectasis or pneumonia or a combination. 2. No other change from the prior study. Persistent small left pleural effusion with associated atelectasis. No pulmonary edema. Electronically Signed   By: Lajean Manes M.D.   On: 12/26/2015 10:12   Ct Chest Wo Contrast  12/26/2015  CLINICAL DATA:   Recent heart surgery 12/16/15 EXAM: CT CHEST WITHOUT CONTRAST TECHNIQUE: Multidetector CT imaging of the chest was performed following the standard protocol without IV contrast. COMPARISON:  Current chest radiograph.  Chest CTA, 11/11/2015. FINDINGS: Neck base and axilla: Small thyroid nodules several calcified. No neck base or axillary masses or adenopathy. Mediastinum and hila: Heart normal in size. Left atrial occlusion device is well positioned. Mild mediastinal adenopathy. There is a 12 mm short axis right peritracheal node and a 12 mm precarinal node. No hilar masses or discrete enlarged lymph nodes. Lungs and pleura: There are areas of loculated pleural fluid on the right. Loculated pleural fluid is seen anteriorly and along the oblique fissure. There is loculated posterior medial pleural fluid which is contiguous with more free fluid at the posterior lung base. Opacity in the right lower lobe is most likely all atelectasis. There is milder atelectasis in the right middle lobe and dependent right upper lobe. There is a small free flowing left pleural effusion. Left lower lobe opacity is also  noted likely atelectasis. There is mild hazy opacity in the left upper lobe mild bilateral interstitial prominence. There is no convincing pneumothorax. There is right anterior chest wall subcutaneous emphysema that extends toward the right axilla. Limited upper abdomen: Upper pole right renal cyst. Otherwise unremarkable. IMPRESSION: 1. New opacity on the current chest radiograph is due to a combination of loculated pleural fluid on the right and right lung, primarily right lower lobe, atelectasis. A component of pneumonia is possible but felt less likely. 2. Small left pleural effusion. Partial left lower lobe atelectasis. 3. Mild interstitial prominence without overt pulmonary edema. 4. No pneumothorax. Right anterior chest wall subcutaneous emphysema. Electronically Signed   By: Lajean Manes M.D.   On: 12/26/2015  10:51   US Abdomen Complete  12/26/2015  CLINICAL DATA:  Diffuse abdominal discomfort s/p heart surgery to removed a benign mass 1 week ago. Pt states she has been nauseated since surgery and has had no appetite. Pt states most of her pain is centered around the chest tube site on the right under the right breast. EXAM: ABDOMEN ULTRASOUND COMPLETE COMPARISON:  CT, 11/11/2015 FINDINGS: Gallbladder: No gallstones or wall thickening visualized. No sonographic Murphy sign noted by sonographer. Common bile duct: Diameter: 5.5 mm Liver: Normal in size and echogenicity.  No mass or focal lesion. IVC: No abnormality visualized. Pancreas: Suboptimally visualized.  Portions seen are unremarkable. Spleen: Size and appearance within normal limits. Right Kidney: Length: 14.4 cm. Normal parenchymal echogenicity. Mild cortical thinning. Two cysts, 1 from the upper pole measuring 5.8 cm and 1 in the lower pole measuring 4.9 cm. No hydronephrosis. Left Kidney: Length: 12.0 cm. Echogenicity within normal limits. No mass or hydronephrosis visualized. Abdominal aorta: No aneurysm visualized. Other findings: None. IMPRESSION: 1. No acute findings. No evidence acute cholecystitis or gallstones. No bile duct dilation. 2. Right renal cysts. Electronically Signed   By: Lajean Manes M.D.   On: 12/26/2015 12:56     STUDIES:  ABD [place] 2/18 >> no acute findings, right renal cysts CT Chest 2/18 >> loculated pleural fluid on right, primarily RLL, atelectasis, small left pleural effusion, L lower atelectasis, no PTX, R anterior chest wall subcutaneous emphysema   CULTURES: BCx2 2/18 >> UC 2/18 >>  U. Strep 2/18 >>   ANTIBIOTICS: Vanco 2/18 >>  Cefepime 2/18 >>   SIGNIFICANT EVENTS: 2/8-2/14  Admit for MAZE procedure, resection of left atrial appendage mass 2/18  Admit for right sided chest pain, loculated pleural effusions on CT  LINES/TUBES:   DISCUSSION: 55 y/o F, former smoker, with a recent admission for MAZE  procedure for atrial fibrillation and resection of left atrial appendage mass (fibroelastoma) readmitted 2/18 with right sided chest pain.  Found to have loculated R effusions  ASSESSMENT / PLAN:  PULMONARY A: Loculated Right Pleural Effusion (s) - apical, lower and in fissure P:   O2 if needed for sats > 92% Pulmonary hygiene [facility] evaluation  IR evaluation for possible CT guided sampling of pleural fluid  See ID   CARDIOVASCULAR A:  Right Chest Pain - post MAZE with loculated effusions, r/o infectious process, bleeding Hx MAZE procedure 2/8 per Dr. Roxy Manns for left atrial appendage mass (fibroelastoma), s/p ablation for Atrial Fibrillation Prolonged QTc - noted on initial EKG in ER, 655  Hx PAF, HTN P:  Admit to ICU for hemodynamic monitoring  Hold home eliquis, lopressor  Consider Heparin gtt in am, pharmacy ordering 10a level for am Continue amiodarone   RENAL A:  AKI  Hypokalemia  Pseudohypocalcemia - corrects to 9.2 for albumin P:   Trend BMP / UOP  Replace electrolytes as indicated   GASTROINTESTINAL A:   GERD P:   Diet as tolerated Continue PPI   HEMATOLOGIC A:   Anemia  Coagulopathy - on Eliquis P:  Trend CBC Hold Eliquis for now SCD's   INFECTIOUS A:   Pleural Effusion - loculated on R, DDx includes infectious etiology, concern for simple effusion vs bleeding P:   ABX / cultures as above Trend fever curve / WBC  ENDOCRINE A:   Hyperglycemia   P:   Monitor glucose on BMP   NEUROLOGIC A:   Anxiety  Pain - post procedure P:   RASS goal: n/a PRN Fentanyl for pain    FAMILY  - Updates: Patient updated at bedside on plan of care.    - Inter-disciplinary family meet or Palliative Care meeting due by: 2/25     Noe Gens, NP-C [facility] Pgr: [phone] or if no answer [phone] 12/26/2015, 2:25 PM

## 2015-12-26 NOTE — ED Notes (Signed)
Surgery by Dr. Roxy Manns, 12-16-2015, presents with pain on rt side

## 2015-12-26 NOTE — Progress Notes (Signed)
Subjective: Patient examined, chest x-ray and CT scan of chest and laboratory values reviewed The patient has recent onset of right flank pain after right minithoracotomy and resection of atrial tumor and Maze procedure. She was discharged to home 4 days ago. She developed pain last night and was evaluated [place] urgent [facility]. She was afebrile in sinus rhythm with low blood pressure was placed on neo-. Chest x-ray shows increased density in the right base from her predischarge x-ray. On CT scan this appears to be a loculated effusion with some atelectasis however there is concern this could represent pneumonia and an empyema since her white count is 25,000 and her lactic acid is increasing. She is admitted to critical care, pan cultures are obtained and she is started on broad-spectrum antibiotics. Her anticoagulation for the Maze procedure,Eliquis , is being held so that she can have a CT-guided IR placed pigtail catheter in the loculated pleural collection at the right base. We will obtain echocardiogram to rule out pericardial effusion or endocarditis. A urine culture is pending.  On exam her right anterior thoracotomy incision has only minimal erythema and does not appear infected. Her 2 chest tube sites have some mild erythema but do not appear purulent.  Objective: Vital signs in last 24 hours: Temp:  [98.1 F (36.7 C)-98.2 F (36.8 C)] 98.2 F (36.8 C) (02/18 1600) Pulse Rate:  [57-113] 64 (02/18 1700) Cardiac Rhythm:  [-] Normal sinus rhythm (02/18 1545) Resp:  [15-29] 19 (02/18 1700) BP: (63-144)/(44-95) 95/57 mmHg (02/18 1700) SpO2:  [77 %-100 %] 100 % (02/18 1700) Weight:  [183 lb (83.008 kg)] 183 lb (83.008 kg) (02/18 0850)  Hemodynamic parameters for last 24 hours:  Afebrile sinus rhythm  Intake/Output from previous day:   Intake/Output this shift: Total I/O In: 3205.8 [P.O.:60; I.V.:3145.8] Out: 75 [Urine:75]      Physical Exam  General: Overweight middle-aged  Caucasian female anxious but breathing comfortably HEENT: Normocephalic pupils equal , dentition adequate Neck: Supple without JVD, adenopathy, or bruit Chest: Diminished breath sounds at right base  with tenderness over chest tube sites             Cardiovascular: Regular rate and rhythm, no murmur, no gallop, peripheral pulses             palpable in all extremities Abdomen:  Soft, nontender, no palpable mass or organomegaly Extremities: Warm, well-perfused, no clubbing cyanosis edema or tenderness,              no venous stasis changes of the legs Rectal/GU: Deferred Neuro: Grossly non--focal and symmetrical throughout Skin: Clean and dry without rash or ulceration   Lab Results:  Recent Labs  12/26/15 0925  WBC 24.6*  HGB 9.9*  HCT 32.0*  PLT 442*   BMET:  Recent Labs  12/26/15 0925  NA 134*  K 3.3*  CL 95*  CO2 28  GLUCOSE 132*  BUN 26*  CREATININE 1.64*  CALCIUM 8.4*    PT/INR:  Recent Labs  12/26/15 0925  LABPROT 23.7*  INR 2.13*   ABG    Component Value Date/Time   PHART 7.345* 12/17/2015 0013   HCO3 19.6* 12/17/2015 0013   TCO2 21 12/17/2015 1709   ACIDBASEDEF 5.0* 12/17/2015 0013   O2SAT 99.0 12/17/2015 0013   CBG (last 3)   Recent Labs  12/26/15 1559  GLUCAP 105*    Assessment/Plan: S/P   We'll follow  patient with critical care, agree with plan in place. I've discussed the plan  of care with the patient and her husband.   LOS: 0 days    Tharon Aquas Trigt III 12/26/2015

## 2015-12-26 NOTE — ED Notes (Signed)
Returns from radiology, VS reassessed

## 2015-12-26 NOTE — ED Notes (Signed)
CURRENT IVF: NS @ 200ML/HR (LEFT AC)                           NEO GTT @ 45ML/HR (30MCG/MIN) - (LEFT AC)

## 2015-12-26 NOTE — ED Notes (Signed)
3000ML IVF NS BOLUS TOTAL

## 2015-12-26 NOTE — ED Notes (Signed)
NBP changed to q45min

## 2015-12-27 ENCOUNTER — Inpatient Hospital Stay (HOSPITAL_COMMUNITY): Payer: BLUE CROSS/BLUE SHIELD

## 2015-12-27 DIAGNOSIS — R079 Chest pain, unspecified: Secondary | ICD-10-CM

## 2015-12-27 DIAGNOSIS — R509 Fever, unspecified: Secondary | ICD-10-CM

## 2015-12-27 DIAGNOSIS — J90 Pleural effusion, not elsewhere classified: Secondary | ICD-10-CM

## 2015-12-27 HISTORY — DX: Pleural effusion, not elsewhere classified: J90

## 2015-12-27 LAB — PROCALCITONIN: PROCALCITONIN: 2.7 ng/mL

## 2015-12-27 LAB — CBC
HCT: 26.5 % — ABNORMAL LOW (ref 36.0–46.0)
HEMOGLOBIN: 8.1 g/dL — AB (ref 12.0–15.0)
MCH: 26.6 pg (ref 26.0–34.0)
MCHC: 30.6 g/dL (ref 30.0–36.0)
MCV: 87.2 fL (ref 78.0–100.0)
Platelets: 370 10*3/uL (ref 150–400)
RBC: 3.04 MIL/uL — AB (ref 3.87–5.11)
RDW: 15.2 % (ref 11.5–15.5)
WBC: 21.6 10*3/uL — AB (ref 4.0–10.5)

## 2015-12-27 LAB — BASIC METABOLIC PANEL
ANION GAP: 10 (ref 5–15)
BUN: 20 mg/dL (ref 6–20)
CHLORIDE: 102 mmol/L (ref 101–111)
CO2: 23 mmol/L (ref 22–32)
Calcium: 7.7 mg/dL — ABNORMAL LOW (ref 8.9–10.3)
Creatinine, Ser: 1.6 mg/dL — ABNORMAL HIGH (ref 0.44–1.00)
GFR calc non Af Amer: 36 mL/min — ABNORMAL LOW (ref >60)
GFR, EST AFRICAN AMERICAN: 41 mL/min — AB (ref >60)
Glucose, Bld: 133 mg/dL — ABNORMAL HIGH (ref 65–99)
POTASSIUM: 3.5 mmol/L (ref 3.5–5.1)
SODIUM: 135 mmol/L (ref 135–145)

## 2015-12-27 LAB — APTT
APTT: 42 s — AB (ref 24–37)
APTT: 79 s — AB (ref 24–37)

## 2015-12-27 LAB — PROTIME-INR
INR: 1.98 — AB (ref 0.00–1.49)
PROTHROMBIN TIME: 22.4 s — AB (ref 11.6–15.2)

## 2015-12-27 LAB — HEPARIN LEVEL (UNFRACTIONATED): Heparin Unfractionated: 2 IU/mL — ABNORMAL HIGH (ref 0.30–0.70)

## 2015-12-27 MED ORDER — DIPHENHYDRAMINE HCL 50 MG/ML IJ SOLN: 12.5000 mg | Freq: Four times a day (QID) | INTRAMUSCULAR | Status: DC | PRN

## 2015-12-27 MED ORDER — MORPHINE SULFATE 2 MG/ML IV SOLN
INTRAVENOUS | Status: DC
  Administered 2015-12-27: 21 mg via INTRAVENOUS
  Administered 2015-12-27: 10.5 mg via INTRAVENOUS
  Administered 2015-12-28: 4 mL via INTRAVENOUS
  Administered 2015-12-28: 13.5 mg via INTRAVENOUS
  Administered 2015-12-28: 6 mg via INTRAVENOUS
  Administered 2015-12-28: 20:00:00 via INTRAVENOUS
  Administered 2015-12-28: 4.5 mg via INTRAVENOUS
  Administered 2015-12-28: 9 mL via INTRAVENOUS
  Administered 2015-12-29: via INTRAVENOUS
  Administered 2015-12-29: 19.5 mg via INTRAVENOUS
  Administered 2015-12-29: 12 mg via INTRAVENOUS
  Administered 2015-12-29: 6 mg via INTRAVENOUS
  Administered 2015-12-29: 10:00:00 via INTRAVENOUS
  Administered 2015-12-29: 6 mg via INTRAVENOUS
  Administered 2015-12-29: 12 mg via INTRAVENOUS
  Administered 2015-12-30: 9 mg via INTRAVENOUS
  Administered 2015-12-30 (×2): 4.5 mg via INTRAVENOUS
  Administered 2015-12-30: 18:00:00 via INTRAVENOUS
  Administered 2015-12-31: 7.5 mg via INTRAVENOUS
  Administered 2015-12-31: 14:00:00 via INTRAVENOUS
  Administered 2015-12-31: 7.5 mL via INTRAVENOUS
  Filled 2015-12-27 (×8): qty 25

## 2015-12-27 MED ORDER — SODIUM CHLORIDE 0.9% FLUSH: 9.0000 mL | INTRAVENOUS | Status: DC | PRN

## 2015-12-27 MED ORDER — DIPHENHYDRAMINE HCL 12.5 MG/5ML PO ELIX
12.5000 mg | ORAL_SOLUTION | Freq: Four times a day (QID) | ORAL | Status: DC | PRN
  Filled 2015-12-27: qty 5

## 2015-12-27 MED ORDER — ONDANSETRON HCL 4 MG/2ML IJ SOLN: 4.0000 mg | Freq: Four times a day (QID) | INTRAMUSCULAR | Status: DC | PRN

## 2015-12-27 MED ORDER — NALOXONE HCL 0.4 MG/ML IJ SOLN: 0.4000 mg | INTRAMUSCULAR | Status: DC | PRN

## 2015-12-27 MED ORDER — HEPARIN BOLUS VIA INFUSION
4000.0000 [IU] | Freq: Once | INTRAVENOUS | Status: DC
  Administered 2015-12-27: 4000 [IU] via INTRAVENOUS
  Filled 2015-12-27: qty 4000

## 2015-12-27 MED ORDER — DEXTROSE 5 % IV SOLN
2.0000 g | INTRAVENOUS | Status: DC
  Administered 2015-12-28: 2 g via INTRAVENOUS
  Filled 2015-12-27 (×2): qty 2

## 2015-12-27 MED ORDER — DIPHENHYDRAMINE HCL 12.5 MG/5ML PO ELIX: 12.5000 mg | ORAL_SOLUTION | Freq: Four times a day (QID) | ORAL | Status: DC | PRN

## 2015-12-27 MED ORDER — MORPHINE SULFATE 2 MG/ML IV SOLN: INTRAVENOUS | Status: DC

## 2015-12-27 MED ORDER — HEPARIN (PORCINE) IN NACL 100-0.45 UNIT/ML-% IJ SOLN
1200.0000 [IU]/h | INTRAMUSCULAR | Status: DC
  Administered 2015-12-27: 1200 [IU]/h via INTRAVENOUS
  Filled 2015-12-27 (×2): qty 250

## 2015-12-27 MED ORDER — VANCOMYCIN HCL IN DEXTROSE 750-5 MG/150ML-% IV SOLN
750.0000 mg | Freq: Two times a day (BID) | INTRAVENOUS | Status: DC
  Administered 2015-12-27 – 2015-12-30 (×8): 750 mg via INTRAVENOUS
  Filled 2015-12-27 (×10): qty 150

## 2015-12-27 NOTE — Progress Notes (Signed)
ANTICOAGULATION CONSULT NOTE - Initial Consult  Pharmacy Consult for heparin Indication: atrial fibrillation  No Known Allergies  Patient Measurements: Height: 5\' 7"  (170.2 cm) Weight: 202 lb 2.6 oz (91.7 kg) IBW/kg (Calculated) : 61.6 Heparin Dosing Weight: 81.4 kg  Vital Signs: Temp: 98.4 F (36.9 C) (02/19 0743) Temp Source: Oral (02/19 0743) BP: 106/64 mmHg (02/19 0800) Pulse Rate: 71 (02/19 0800)  Labs:  Recent Labs  12/26/15 0925 12/27/15 0226 12/27/15 0723  HGB 9.9* 8.1*  --   HCT 32.0* 26.5*  --   PLT 442* 370  --   APTT  --  42*  --   LABPROT 23.7*  --  22.4*  INR 2.13*  --  1.98*  HEPARINUNFRC  --  2.00*  --   CREATININE 1.64* 1.60*  --     Estimated Creatinine Clearance: 46.7 mL/min (by C-G formula based on Cr of 1.6).   Medical History: Past Medical History  Diagnosis Date  . GERD (gastroesophageal reflux disease)   . Chronic lower back pain   . Paroxysmal atrial fibrillation (HCC)   . Spinal stenosis   . SUI (stress urinary incontinence, female)   . History of kidney stones   . TMJ (temporomandibular joint syndrome)     LEFT SIDE--  WEAR  MOUTH GUARD  . H/O hiatal hernia   . Plantar fasciitis   . Typical atrial flutter ([place])   . 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 Heart murmur   . Hypertension   . Dysrhythmia 02/2015    afib  . Depression   . Anxiety   . Migraines     h/o migraines   . Arthritis     low back, degenerative spine   . S/P Minimally-invasive maze operation for atrial fibrillation 12/16/2015    Complete bilateral atrial lesion set using bipolar radiofrequency and cryothermy ablation with clipping of LA appendage via right mini thoracotomy approach  . 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 She is on apixiban PTA - last dose 2/18 ~0800. Pt found with empyema and pleural  effusions on CT.  IR plans for perc drain to be placed sometime tomorrow 2/20. Hgb 8.1, plts 370. Per Dr. Elsworth Soho, start heparin this morning.  Baseline HL is 2.00 and aPTT is 42.  Will dose based on aPTTs until HL within range (apixaban can affect HLs).  Goal of Therapy:  APTT 66-102 seconds Heparin level 0.3-0.7 units/ml Monitor platelets by anticoagulation protocol: Yes   Plan:  - Heparin bolus 4,000 units x 1 - Heparin infusion at 1200 units/hr - Follow aPTTs until HL within range - 6-hr aPTT @ 1700 - Daily HL, CBC, aPTT - F/u stopping heparin tomorrow before procedure   L. Nicole Kindred, PharmD PGY2 Infectious Diseases Pharmacy Resident Pager: [phone] 12/27/2015 10:20 AM

## 2015-12-27 NOTE — Progress Notes (Signed)
PCA pump started. VS stable, states "it started to work,  It takes edge off" RASS is 0

## 2015-12-27 NOTE — Progress Notes (Signed)
Notified E-link nurse that pt is febrile.

## 2015-12-27 NOTE — Progress Notes (Addendum)
PULMONARY / CRITICAL CARE MEDICINE   Name: Miranda Taylor MRN: [id number] DOB: September 21, 1961    ADMISSION DATE:  12/26/2015 CONSULTATION DATE:  12/26/15  REFERRING MD:  Dr. 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     She presented today with subjective fevers, right-sided pleuritic chest pain and leukocytosis. Chest x-ray showed an CT chest confirmed loculated pleural fluid on the right lower lobe and within the fissure. Lactate was 1.3 and increased to 2.9    SUBJECTIVE: Febrile 102  decreased chest discomfort  VITAL SIGNS: BP 116/101 mmHg  Pulse 72  Temp(Src) 98.4 F (36.9 C) (Oral)  Resp 21  Ht 5\' 7"  (1.702 m)  Wt 91.7 kg (202 lb 2.6 oz)  BMI 31.66 kg/m2  SpO2 100%  LMP 12/25/2012  HEMODYNAMICS:    VENTILATOR SETTINGS:    INTAKE / OUTPUT: I/O last 3 completed shifts: In: 4575.8 [P.O.:180; I.V.:4345.8; IV Piggyback:50] Out: [id number] [Urine:925]  PHYSICAL EXAMINATION: General:  Well developed female in NAD, lying supinein bed Neuro:  AAOx4, speech clear, MAE HEENT:  MM pink/dry, no jvd Cardiovascular:  s1s2 rrr, no m/r/g, SB to SR on monitor  Lungs:  Even/non-labored, decreased rt base Abdomen:  Obese/soft, bsx4 active  Musculoskeletal:  No acute deformities  Skin:  R groin incision erythematous, small area of induration, no drainage from site.  R anterior chest incision well approximated, no erythema or drainage, R lateral chest tube sites with mild erythema, no drainage from site   LABS:  BMET  Recent Labs Lab 12/22/15 0332 12/26/15 0925  12/27/15 0226  NA 138 134* 135  K 3.6 3.3* 3.5  CL 101 95* 102  CO2 27 28 23   BUN 14 26* 20  CREATININE 1.05* 1.64* 1.60*  GLUCOSE 96 132* 133*    Electrolytes  Recent Labs Lab 12/22/15 0332 12/26/15 0925 12/27/15 0226  CALCIUM 8.6* 8.4* 7.7*    CBC  Recent Labs Lab 12/21/15 0258 12/26/15 0925 12/27/15 0226  WBC 8.5 24.6* 21.6*  HGB 9.5* 9.9* 8.1*  HCT 30.3* 32.0* 26.5*  PLT 200 442* 370    Coag's  Recent Labs Lab 12/26/15 0925 12/27/15 0226 12/27/15 0723  APTT  --  42*  --   INR 2.13*  --  1.98*    Sepsis Markers  Recent Labs Lab 12/26/15 1122 12/26/15 1436  LATICACIDVEN 1.30 2.91*    ABG No results for input(s): PHART, PCO2ART, PO2ART in the last 168 hours.  Liver Enzymes  Recent Labs Lab 12/26/15 0925  AST 26  ALT 17  ALKPHOS 86  BILITOT 1.0  ALBUMIN 3.0*    Cardiac Enzymes No results for input(s): TROPONINI, PROBNP in the last 168 hours.  Glucose  Recent Labs Lab 12/26/15 1559  GLUCAP 105*    Imaging Dg Chest 2 View  12/26/2015  CLINICAL DATA:  Initial encounter. 55 y/o female with recent heart surgery (removed of benign tumor) that was discharged Tuesday. Here with c/o SOB, fever, nausea. Right chest pain with redness, probably from chest tubes Hx arrythmia, ex-smoker EXAM: CHEST  2 VIEW COMPARISON:  12/22/2015 FINDINGS: There is  increased opacity at the right lung base now obscuring the right hemidiaphragm. Opacity at the left lung base is similar to the prior study consistent with a combination of pleural fluid and atelectasis. There is no convincing pulmonary edema. No pneumothorax. Cardiac silhouette is top-normal in size. Left atrial occlusion ir device is stable. IMPRESSION: 1. New opacity at the right lung base. This is likely combination of a small effusion with either atelectasis or pneumonia or a combination. 2. No other change from the prior study. Persistent small left pleural effusion with associated atelectasis. No  pulmonary edema. Electronically Signed   By: Lajean Manes M.D.   On: 12/26/2015 10:12   Ct Chest Wo Contrast  12/26/2015  CLINICAL DATA:  Recent heart surgery 12/16/15 EXAM: CT CHEST WITHOUT CONTRAST TECHNIQUE: Multidetector CT imaging of the chest was performed following the standard protocol without IV contrast. COMPARISON:  Current chest radiograph.  Chest CTA, 11/11/2015. FINDINGS: Neck base and axilla: Small thyroid nodules several calcified. No neck base or axillary masses or adenopathy. Mediastinum and hila: Heart normal in size. Left atrial occlusion device is well positioned. Mild mediastinal adenopathy. There is a 12 mm short axis right peritracheal node and a 12 mm precarinal node. No hilar masses or discrete enlarged lymph nodes. Lungs and pleura: There are areas of loculated pleural fluid on the right. Loculated pleural fluid is seen anteriorly and along the oblique fissure. There is loculated posterior medial pleural fluid which is contiguous with more free fluid at the posterior lung base. Opacity in the right lower lobe is most likely all atelectasis. There is milder atelectasis in the right middle lobe and dependent right upper lobe. There is a small free flowing left pleural effusion. Left lower lobe opacity is also noted likely atelectasis. There is mild hazy opacity in the left upper lobe mild bilateral interstitial prominence. There is no convincing pneumothorax. There is right anterior chest wall subcutaneous emphysema that extends toward the right axilla. Limited upper abdomen: Upper pole right renal cyst. Otherwise unremarkable. IMPRESSION: 1. New opacity on the current chest radiograph is due to a combination of loculated pleural fluid on the right and right lung, primarily right lower lobe, atelectasis. A component of pneumonia is possible but felt less likely. 2. Small left pleural effusion. Partial left lower lobe atelectasis. 3. Mild interstitial prominence without overt pulmonary  edema. 4. No pneumothorax. Right anterior chest wall subcutaneous emphysema. Electronically Signed   By: Lajean Manes M.D.   On: 12/26/2015 10:51   US Abdomen Complete  12/26/2015  CLINICAL DATA:  Diffuse abdominal discomfort s/p heart surgery to removed a benign mass 1 week ago. Pt states she has been nauseated since surgery and has had no appetite. Pt states most of her pain is centered around the chest tube site on the right under the right breast. EXAM: ABDOMEN ULTRASOUND COMPLETE COMPARISON:  CT, 11/11/2015 FINDINGS: Gallbladder: No gallstones or wall thickening visualized. No sonographic Murphy sign noted by sonographer. Common bile duct: Diameter: 5.5 mm Liver: Normal in size and echogenicity.  No mass or focal lesion. IVC: No abnormality visualized. Pancreas: Suboptimally visualized.  Portions seen are unremarkable. Spleen: Size and appearance within normal limits. Right Kidney: Length: 14.4 cm. Normal parenchymal echogenicity. Mild cortical thinning. Two cysts, 1 from the upper pole measuring 5.8 cm and 1 in the lower pole measuring 4.9 cm. No hydronephrosis. Left Kidney: Length: 12.0 cm. Echogenicity within normal limits. No mass or hydronephrosis visualized. Abdominal aorta: No aneurysm visualized. Other findings: None.  IMPRESSION: 1. No acute findings. No evidence acute cholecystitis or gallstones. No bile duct dilation. 2. Right renal cysts. Electronically Signed   By: Lajean Manes M.D.   On: 12/26/2015 12:56   Dg Chest Port 1 View  12/27/2015  CLINICAL DATA:  Shortness of breath. EXAM: PORTABLE CHEST 1 VIEW COMPARISON:  12/26/2015 and chest CT dated 12/26/2015. FINDINGS: The cardiac silhouette remains borderline enlarged. Increased density at both lung bases. Stable diffuse prominence of the interstitial markings. Unremarkable bones. IMPRESSION: 1. Increased bibasilar atelectasis or pneumonia. 2. Mildly increased bibasilar pleural fluid. 3. Stable interstitial lung disease. Electronically  Signed   By: Claudie Revering M.D.   On: 12/27/2015 07:53     STUDIES:  ABD [place] 2/18 >> no acute findings, right renal cysts CT Chest 2/18 >> loculated pleural fluid on right, primarily RLL, atelectasis, small left pleural effusion, L lower atelectasis, no PTX, R anterior chest wall subcutaneous emphysema   CULTURES: BCx2 2/18 >> UC 2/18 >>  U. Strep 2/18 >>   ANTIBIOTICS: Vanco 2/18 >>  Cefepime 2/18 >>   SIGNIFICANT EVENTS: 2/8-2/14  Admit for MAZE procedure, resection of left atrial appendage mass 2/18  Admit for right sided chest pain, loculated pleural effusions on CT  LINES/TUBES:   DISCUSSION: 55 y/o F, former smoker, with a recent admission for MAZE procedure for atrial fibrillation and resection of left atrial appendage mass (fibroelastoma) readmitted 2/18 with right sided chest pain.  Found to have loculated R effusions  ASSESSMENT / PLAN:  PULMONARY A: Loculated Right Pleural Effusion (s) - apical, lower and in fissure P:   O2 if needed for sats > 92% Pulmonary hygiene TCTS  following IR evaluation for possible CT guided sampling of pleural fluid    CARDIOVASCULAR A:  Right Chest Pain - post MAZE with loculated effusions, r/o infectious process, bleeding Hx MAZE procedure 2/8 per Dr. Roxy Manns for left atrial appendage mass (fibroelastoma), s/p ablation for Atrial Fibrillation Prolonged QTc - noted on initial EKG in ER, 655  Hx PAF, HTN Septic shock- resolved P:  Hold home eliquis, lopressor ,start Heparin gtt  Continue amiodarone   RENAL A:   AKI - baseline cr 0.8 Hypokalemia  Pseudohypocalcemia - corrects to 9.2 for albumin P:   Trend BMP / UOP  Replace electrolytes as indicated   GASTROINTESTINAL A:   GERD P:   Diet as tolerated Continue PPI   HEMATOLOGIC A:   Anemia  Coagulopathy - on Eliquis P:  Trend CBC Hold Eliquis for now SCD's   INFECTIOUS A:   Pleural Effusion - loculated on R, DDx includes infectious etiology, concern for   effusion vs bleeding UTI possible P:   ABX / cultures as above Trend fever curve / WBC  ENDOCRINE A:   Hyperglycemia   P:   Monitor glucose on BMP   NEUROLOGIC A:   Anxiety  Pain - post procedure P:   RASS goal: n/a PRN Fentanyl for pain    FAMILY  - Updates: Patient updated at bedside on plan of care.    - Inter-disciplinary family meet or Palliative Care meeting due by: 2/25  Summary - septic Shock resolved, await IR drain & urine cx, can start heparin gtt if no procedure today Transfer to SDU  Kara Mead MD. [facility]. [facility] Pager [phone] If no response call [phone]     12/27/2015, 8:26 AM

## 2015-12-27 NOTE — Progress Notes (Signed)
  Subjective: Still having considerable right anterior chest pain Temperature 102, white count slightly improved 21,000 Cultures negative so far IR catheter drainage of loculated right pleural collection to be done tomorrow to allow 48 hours after Eliquis  Stopped Posterior right chest tube incision gently probed with culture      stick and clear fluid obtained for culture  We'll change to PCA because intermittent IV fentanyl has been ineffective for the patient's pain control  Objective: Vital signs in last 24 hours: Temp:  [97.2 F (36.2 C)-102 F (38.9 C)] 98.4 F (36.9 C) (02/19 0743) Pulse Rate:  [57-113] 75 (02/19 1000) Cardiac Rhythm:  [-] Normal sinus rhythm (02/19 0800) Resp:  [15-31] 24 (02/19 1000) BP: (63-144)/(44-110) 105/57 mmHg (02/19 1000) SpO2:  [85 %-100 %] 99 % (02/19 1000) Weight:  [202 lb 2.6 oz (91.7 kg)] 202 lb 2.6 oz (91.7 kg) (02/19 0344)  Hemodynamic parameters for last 24 hours:    Intake/Output from previous day: 02/18 0701 - 02/19 0700 In: 4575.8 [P.O.:180; I.V.:4345.8; IV Piggyback:50] Out: 925 [Urine:925] Intake/Output this shift: Total I/O In: 267.9 [I.V.:267.9] Out: -   Diminished breath sounds right base No cardiac murmur  Lab Results:  Recent Labs  12/26/15 0925 12/27/15 0226  WBC 24.6* 21.6*  HGB 9.9* 8.1*  HCT 32.0* 26.5*  PLT 442* 370   BMET:  Recent Labs  12/26/15 0925 12/27/15 0226  NA 134* 135  K 3.3* 3.5  CL 95* 102  CO2 28 23  GLUCOSE 132* 133*  BUN 26* 20  CREATININE 1.64* 1.60*  CALCIUM 8.4* 7.7*    PT/INR:  Recent Labs  12/27/15 0723  LABPROT 22.4*  INR 1.98*   ABG    Component Value Date/Time   PHART 7.345* 12/17/2015 0013   HCO3 19.6* 12/17/2015 0013   TCO2 21 12/17/2015 1709   ACIDBASEDEF 5.0* 12/17/2015 0013   O2SAT 99.0 12/17/2015 0013   CBG (last 3)   Recent Labs  12/26/15 1559  GLUCAP 105*    Assessment/Plan: S/P   IR pigtail catheter drainage of right pleural collection with  fluid for culture tomorrow Hold Eliquis Continue broad-spectrum antibiotics Echocardiogram pending   LOS: 1 day    Tharon Aquas Trigt III 12/27/2015

## 2015-12-27 NOTE — Progress Notes (Addendum)
ANTICOAGULATION CONSULT NOTE   Pharmacy Consult for heparin Indication: atrial fibrillation  No Known Allergies  Patient Measurements: Height: 5\' 7"  (170.2 cm) Weight: 202 lb 2.6 oz (91.7 kg) IBW/kg (Calculated) : 61.6 Heparin Dosing Weight: 81.4 kg  Vital Signs: Temp: 100.1 F (37.8 C) (02/19 1645) Temp Source: Oral (02/19 1645) BP: 119/60 mmHg (02/19 1700) Pulse Rate: 93 (02/19 1700)  Labs:  Recent Labs  12/26/15 0925 12/27/15 0226 12/27/15 0723 12/27/15 1635  HGB 9.9* 8.1*  --   --   HCT 32.0* 26.5*  --   --   PLT 442* 370  --   --   APTT  --  42*  --  79*  LABPROT 23.7*  --  22.4*  --   INR 2.13*  --  1.98*  --   HEPARINUNFRC  --  2.00*  --   --   CREATININE 1.64* 1.60*  --   --     Estimated Creatinine Clearance: 46.7 mL/min (by C-G formula based on Cr of 1.6).   Medical History: Past Medical History  Diagnosis Date  . GERD (gastroesophageal reflux disease)   . Chronic lower back pain   . Paroxysmal atrial fibrillation (HCC)   . Spinal stenosis   . SUI (stress urinary incontinence, female)   . History of kidney stones   . TMJ (temporomandibular joint syndrome)     LEFT SIDE--  WEAR  MOUTH GUARD  . H/O hiatal hernia   . Plantar fasciitis   . Typical atrial flutter ([place])   . 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 Heart murmur   . Hypertension   . Dysrhythmia 02/2015    afib  . Depression   . Anxiety   . Migraines     h/o migraines   . Arthritis     low back, degenerative spine   . S/P Minimally-invasive maze operation for atrial fibrillation 12/16/2015    Complete bilateral atrial lesion set using bipolar radiofrequency and cryothermy ablation with clipping of LA appendage via right mini thoracotomy approach  . 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 She is on apixiban PTA - last dose 2/18  ~0800 and currently on heparin with plans for IR plans for perc drain   -aPTT= 79 and at goal  Goal of Therapy:  APTT 66-102 seconds Heparin level 0.3-0.7 units/ml Monitor platelets by anticoagulation protocol: Yes   Plan:  -No heparin changes needed -Confirm an aPTT in 6hrs - Daily HL, CBC, aPTT  Hildred Laser, Pharm D 12/27/2015 6:27 PM

## 2015-12-27 NOTE — Progress Notes (Signed)
*  PRELIMINARY RESULTS* Echocardiogram 2D Echocardiogram has been performed.  Miranda Taylor 12/27/2015, 4:30 PM

## 2015-12-27 NOTE — H&P (Signed)
Chief Complaint: Empyema  Referring Physician(s): ALVA, RAKESH    History of Present Illness: Miranda Taylor is a 55 y.o. female who underwent excision of an atrial mass and a MAZE by Dr. Roxy Manns on 12/16/2015.  She was discharged from the hospital on 12/22/2015.  Last night she presented to the ED with increase right-sided chest pain site at the surgical site and particularly where the drain tubes were.   She reports she has had some pain in that area but it's gotten significantly worse and now is not controlled with Percocet  She states she also has some mild shortness of breath no fevers.  She denies any anterior chest pain.  She denies fevers at home, but she did have a Tmax of 102 at 0412.  Currently she is afebrile.  She did require phenylephrine last night after pain medications caused some hypotension, but she is NOT currently on pressors during the time of my visit.  She states she thinks her last dose of Eliquis was yesterday, but she could not remember.  CT scan done shows loculated empyema right chest.  We are asked to evaluate her for percutaneous drain placement.  Past Medical History  Diagnosis Date  . GERD (gastroesophageal reflux disease)   . Chronic lower back pain   . Paroxysmal atrial fibrillation (HCC)   . Spinal stenosis   . SUI (stress urinary incontinence, female)   . History of kidney stones   . TMJ (temporomandibular joint syndrome)     LEFT SIDE--  WEAR  MOUTH GUARD  . H/O hiatal hernia   . Plantar fasciitis   . Typical atrial flutter ([place])   . 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 Heart murmur   . Hypertension   . Dysrhythmia 02/2015    afib  . Depression   . Anxiety   . Migraines     h/o migraines   . Arthritis     low back, degenerative spine   . S/P Minimally-invasive maze operation for atrial fibrillation 12/16/2015    Complete bilateral atrial lesion set using bipolar radiofrequency and cryothermy ablation with clipping of LA  appendage via right mini thoracotomy approach  . 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 Anterior cervical decomp/discectomy fusion  04-08-2010    C4 -- C5  . Posterior fusion lumbar spine  09-16-2009    L4 -- L5  . Appendectomy  1984  . Transthoracic echocardiogram  04-03-2012    normal LVF/  ef 55-60%  . Cardiovascular stress test  04-27-2014   dr berry    normal perfusion study/  no ischemia/  ef 61%  . Lasik  1990's  . Bilateral thunb joint arthrodesis  left 2007/   right 2010  . Negative sleep study  04-15-2013  . Cystoscopy N/A 08/04/2014    Procedure: CYSTOSCOPY;  Surgeon: Ardis Hughs, MD;  Location: [facility];  Service: Urology;  Laterality: N/A;  . Pubovaginal sling N/A 08/04/2014    Procedure: BOSTON SCIENTIFIC RETRO-PUBIC MID URETHRAL SLING;  Surgeon: Ardis Hughs, MD;  Location: [facility];  Service: Urology;  Laterality: N/A;  . Tee without cardioversion N/A 02/06/2015    Procedure: TRANSESOPHAGEAL ECHOCARDIOGRAM (TEE);  Surgeon: Lelon Perla, MD;  Location: [facility] ENDOSCOPY;  Service: Cardiovascular;  Laterality: N/A;  . Tee without cardioversion N/A 05/14/2015    Procedure: TRANSESOPHAGEAL ECHOCARDIOGRAM (TEE);  Surgeon: Dani Gobble  Croitoru, MD;  Location: [place];  Service: Cardiovascular;  Laterality: N/A;  . Tee without cardioversion N/A 09/02/2015    Procedure: TRANSESOPHAGEAL ECHOCARDIOGRAM (TEE);  Surgeon: Sueanne Margarita, MD;  Location: [facility] ENDOSCOPY;  Service: Cardiovascular;  Laterality: N/A;  . Tee without cardioversion N/A 09/09/2015    Procedure: TRANSESOPHAGEAL ECHOCARDIOGRAM (TEE);  Surgeon: Lelon Perla, MD;  Location: [facility] ENDOSCOPY;  Service: Cardiovascular;  Laterality: N/A;  . 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 Minimally invasive maze procedure N/A 12/16/2015    Procedure: MINIMALLY INVASIVE MAZE PROCEDURE;  Surgeon: Rexene Alberts, MD;  Location: [place];  Service: Open Heart Surgery;  Laterality: N/A;  . Tee without cardioversion N/A 12/16/2015    Procedure: TRANSESOPHAGEAL ECHOCARDIOGRAM (TEE);  Surgeon: Rexene Alberts, MD;  Location: [place];  Service: Open Heart Surgery;  Laterality: N/A;  . Clipping of atrial appendage N/A 12/16/2015    Procedure: CLIPPING OF ATRIAL APPENDAGE;  Surgeon: Rexene Alberts, MD;  Location: [place];  Service: Open Heart Surgery;  Laterality: N/A;    Allergies: Review of patient's allergies indicates no known allergies.  Medications: Prior to Admission medications   Medication Sig Start Date End Date Taking? Authorizing Provider  amiodarone (PACERONE) 200 MG tablet Take 1 tablet (200 mg total) by mouth daily. 12/22/15  Yes Donielle Liston Alba, PA-C  apixaban (ELIQUIS) 5 MG TABS tablet Take 1 tablet (5 mg total) by mouth 2 (two) times daily. 10/19/15  Yes Rhonda G Barrett, PA-C  cycloSPORINE (RESTASIS) 0.05 % ophthalmic emulsion Place 1 drop into both eyes 2 (two) times daily as needed (for dry eyes).    Yes Historical Provider, MD  desvenlafaxine (PRISTIQ) 50 MG 24 hr tablet Take 100 mg by mouth daily before breakfast.    Yes Historical Provider, MD  furosemide (LASIX) 40 MG tablet For 5 days then stop. 12/22/15  Yes Donielle Liston Alba, PA-C  LORazepam (ATIVAN) 0.5 MG tablet Take 0.5 mg by mouth at bedtime.    Yes Historical Provider, MD  metoprolol tartrate (LOPRESSOR) 25 MG tablet Take 0.5 tablets (12.5 mg total) by mouth 2 (two) times daily. 12/22/15  Yes Donielle Liston Alba, PA-C  Multiple Vitamin (MULITIVITAMIN WITH MINERALS) TABS Take 1 tablet by mouth daily.   Yes Historical Provider, MD  NEXIUM 40 MG capsule Take 40 mg by mouth daily as needed (for heartburn).  06/25/12  Yes Historical  Provider, MD  ondansetron (ZOFRAN ODT) 4 MG disintegrating tablet Take 1 tablet (4 mg total) by mouth every 8 (eight) hours as needed for nausea or vomiting. 12/22/15  Yes Donielle Liston Alba, PA-C  oxyCODONE (OXY IR/ROXICODONE) 5 MG immediate release tablet Take 1-2 tablets (5-10 mg total) by mouth every 4 (four) hours as needed for severe pain. 12/22/15  Yes Donielle Liston Alba, PA-C  polyethylene glycol powder (GLYCOLAX/MIRALAX) powder Take 17 g by mouth every other day. 10/29/15  Yes Historical Provider, MD  potassium chloride (K-DUR) 10 MEQ tablet For 5 days then stop. 12/22/15  Yes Donielle Liston Alba, PA-C  rizatriptan (MAXALT-MLT) 10 MG disintegrating tablet Take 10 mg by mouth daily as needed for migraine.  02/26/13  Yes Historical Provider, MD  triamcinolone cream (KENALOG) 0.1 % Apply 1 application topically daily as needed (for rash). Apply after bathing 10/22/15  Yes Historical Provider, MD  zolpidem (AMBIEN) 5 MG tablet Take 5 mg by mouth at bedtime. 11/17/15  Yes Historical Provider, MD     Family History  Problem Relation Age of Onset  . Hypertension    . Heart attack Father 48  . Heart disease Mother   . CAD Father 74  . Arrhythmia Father     Social History   Social History  . Marital Status: Married    Spouse Name: N/A  . Number of Children: N/A  . Years of Education: N/A   Occupational History  . Disabled    Social History Main Topics  . Smoking status: Former Smoker -- 0.50 packs/day for 20 years    Types: Cigarettes    Quit date: 11/07/2001  . Smokeless tobacco: Never Used  . Alcohol Use: No  . Drug Use: No  . Sexual Activity: Not Asked   Other Topics Concern  . None   Social History Narrative   Pt lives with husband in [place].  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    Review of Systems:  A 12 point ROS discussed Review of Systems  Constitutional: Positive for activity change and fatigue. Negative  for fever, chills and appetite change.  HENT: Negative.   Respiratory: Positive for shortness of breath. Negative for cough and wheezing.   Cardiovascular: Positive for chest pain.  Gastrointestinal: Negative for nausea, vomiting, abdominal pain and abdominal distention.  Genitourinary: Negative.   Musculoskeletal: Negative.   Skin: Negative.   Neurological: Negative.   Psychiatric/Behavioral: Negative.     Vital Signs: BP 106/64 mmHg  Pulse 71  Temp(Src) 98.4 F (36.9 C) (Oral)  Resp 18  Ht 5\' 7"  (1.702 m)  Wt 202 lb 2.6 oz (91.7 kg)  BMI 31.66 kg/m2  SpO2 97%  LMP 12/25/2012  Physical Exam  Constitutional: She is oriented to person, place, and time. She appears well-developed and well-nourished.  HENT:  Head: Normocephalic and atraumatic.  Eyes: EOM are normal.  Neck: Normal range of motion. Neck supple.  Cardiovascular: Normal rate, regular rhythm and normal heart sounds.   Pulmonary/Chest: Effort normal and breath sounds normal. No respiratory distress.  Abdominal: Soft. Bowel sounds are normal. She exhibits no distension.  Musculoskeletal: Normal range of motion.  Neurological: She is alert and oriented to person, place, and time. No cranial nerve deficit.  Skin: Skin is warm and dry.  2 chest tube incisions under right breast. Sutures remain in place. Incision right upper chest, appears to be healing well.  Psychiatric: She has a normal mood and affect. Her behavior is normal. Judgment and thought content normal.  Vitals reviewed.   Mallampati Score:  MD Evaluation Airway: WNL Heart: WNL Abdomen: WNL Chest/ Lungs: WNL ASA  Classification: 2 Mallampati/Airway Score: One  Imaging: Dg Chest 2 View  12/26/2015  CLINICAL DATA:  Initial encounter. 55 y/o female with recent heart surgery (removed of benign tumor) that was discharged Tuesday. Here with c/o SOB, fever, nausea. Right chest pain with redness, probably from chest tubes Hx arrythmia, ex-smoker EXAM:  CHEST  2 VIEW COMPARISON:  12/22/2015 FINDINGS: There is increased opacity at the right lung base now obscuring the right hemidiaphragm. Opacity at the left lung base is similar to the prior study consistent with a combination of pleural fluid and atelectasis. There is no convincing pulmonary edema. No pneumothorax. Cardiac silhouette is top-normal in size. Left atrial occlusion ir device is stable. IMPRESSION: 1. New opacity at the right lung base. This  is likely combination of a small effusion with either atelectasis or pneumonia or a combination. 2. No other change from the prior study. Persistent small left pleural effusion with associated atelectasis. No pulmonary edema. Electronically Signed   By: Lajean Manes M.D.   On: 12/26/2015 10:12   Dg Chest 2 View  12/22/2015  CLINICAL DATA:  Atelectasis, followup EXAM: CHEST  2 VIEW COMPARISON:  Portable chest x-ray of 12/21/2015 FINDINGS: The lung bases are slightly better aerated. There is persistent bibasilar atelectasis present. Also small effusions are noted left slightly greater than right. The right chest tube has removed been removed and there is a small approximately 5% right apical pneumothorax present. Heart size is stable. IMPRESSION: 1. Slightly improved aeration of the lung bases. 2. Small right apical pneumothorax of approximately 5% after removal of right chest tube. 3. Small effusions. Electronically Signed   By: Ivar Drape M.D.   On: 12/22/2015 08:05   Dg Chest 2 View  12/14/2015  CLINICAL DATA:  Atrial fibrillation, atrial mass, former smoker. EXAM: CHEST  2 VIEW COMPARISON:  PA and lateral chest x-ray of February 03, 2015, and chest CT scan of November 11, 2015. FINDINGS: The lungs are well-expanded. There is no focal infiltrate. Previously demonstrated coarse interstitial markings bilaterally have improved. No pulmonary parenchymal nodules or masses are observed. The heart and pulmonary vascularity are normal. The mediastinum is normal in width.  The trachea is midline. The bony thorax exhibits no acute abnormality. IMPRESSION: Mild chronic hyperinflation may reflect underlying COPD or reactive airway disease. There is no alveolar pneumonia nor CHF. Previously demonstrated increased interstitial markings have resolved. Electronically Signed   By: David  Martinique M.D.   On: 12/14/2015 10:41   Ct Chest Wo Contrast  12/26/2015  CLINICAL DATA:  Recent heart surgery 12/16/15 EXAM: CT CHEST WITHOUT CONTRAST TECHNIQUE: Multidetector CT imaging of the chest was performed following the standard protocol without IV contrast. COMPARISON:  Current chest radiograph.  Chest CTA, 11/11/2015. FINDINGS: Neck base and axilla: Small thyroid nodules several calcified. No neck base or axillary masses or adenopathy. Mediastinum and hila: Heart normal in size. Left atrial occlusion device is well positioned. Mild mediastinal adenopathy. There is a 12 mm short axis right peritracheal node and a 12 mm precarinal node. No hilar masses or discrete enlarged lymph nodes. Lungs and pleura: There are areas of loculated pleural fluid on the right. Loculated pleural fluid is seen anteriorly and along the oblique fissure. There is loculated posterior medial pleural fluid which is contiguous with more free fluid at the posterior lung base. Opacity in the right lower lobe is most likely all atelectasis. There is milder atelectasis in the right middle lobe and dependent right upper lobe. There is a small free flowing left pleural effusion. Left lower lobe opacity is also noted likely atelectasis. There is mild hazy opacity in the left upper lobe mild bilateral interstitial prominence. There is no convincing pneumothorax. There is right anterior chest wall subcutaneous emphysema that extends toward the right axilla. Limited upper abdomen: Upper pole right renal cyst. Otherwise unremarkable. IMPRESSION: 1. New opacity on the current chest radiograph is due to a combination of loculated pleural  fluid on the right and right lung, primarily right lower lobe, atelectasis. A component of pneumonia is possible but felt less likely. 2. Small left pleural effusion. Partial left lower lobe atelectasis. 3. Mild interstitial prominence without overt pulmonary edema. 4. No pneumothorax. Right anterior chest wall subcutaneous emphysema. Electronically Signed   By:  Lajean Manes M.D.   On: 12/26/2015 10:51   US Abdomen Complete  12/26/2015  CLINICAL DATA:  Diffuse abdominal discomfort s/p heart surgery to removed a benign mass 1 week ago. Pt states she has been nauseated since surgery and has had no appetite. Pt states most of her pain is centered around the chest tube site on the right under the right breast. EXAM: ABDOMEN ULTRASOUND COMPLETE COMPARISON:  CT, 11/11/2015 FINDINGS: Gallbladder: No gallstones or wall thickening visualized. No sonographic Murphy sign noted by sonographer. Common bile duct: Diameter: 5.5 mm Liver: Normal in size and echogenicity.  No mass or focal lesion. IVC: No abnormality visualized. Pancreas: Suboptimally visualized.  Portions seen are unremarkable. Spleen: Size and appearance within normal limits. Right Kidney: Length: 14.4 cm. Normal parenchymal echogenicity. Mild cortical thinning. Two cysts, 1 from the upper pole measuring 5.8 cm and 1 in the lower pole measuring 4.9 cm. No hydronephrosis. Left Kidney: Length: 12.0 cm. Echogenicity within normal limits. No mass or hydronephrosis visualized. Abdominal aorta: No aneurysm visualized. Other findings: None. IMPRESSION: 1. No acute findings. No evidence acute cholecystitis or gallstones. No bile duct dilation. 2. Right renal cysts. Electronically Signed   By: Lajean Manes M.D.   On: 12/26/2015 12:56   Dg Chest Port 1 View  12/27/2015  CLINICAL DATA:  Shortness of breath. EXAM: PORTABLE CHEST 1 VIEW COMPARISON:  12/26/2015 and chest CT dated 12/26/2015. FINDINGS: The cardiac silhouette remains borderline enlarged. Increased density  at both lung bases. Stable diffuse prominence of the interstitial markings. Unremarkable bones. IMPRESSION: 1. Increased bibasilar atelectasis or pneumonia. 2. Mildly increased bibasilar pleural fluid. 3. Stable interstitial lung disease. Electronically Signed   By: Claudie Revering M.D.   On: 12/27/2015 07:53   Dg Chest Port 1 View  12/21/2015  CLINICAL DATA:  55 year old female with a history of left atrial mass, papillary fibro last stoma, with minimally invasive resection left atrial mass and Maze procedure performed 12/16/2015 EXAM: PORTABLE CHEST 1 VIEW COMPARISON:  Multiple prior chest x-ray, most recent 12/20/2015 FINDINGS: Cardiomediastinal silhouette relatively unchanged given the patient's positioning and low lung volumes. Surgical changes of prior left atrial amputation. Unchanged position of right thoracostomy tube terminating towards the apex. No visualized pneumothorax on the current study. Percutaneous epicardial pacing leads remain in position. Uncertain if there is a mediastinal drain remaining. Low lung volumes with similar appearance of ill-defined opacities at the bilateral lung bases. Persistent opacification of the left hemidiaphragm. Blunting of the left costophrenic angle. IMPRESSION: Similar appearance of the chest x-ray with persisting bibasilar atelectasis and left pleural effusion. Unchanged position of right thoracostomy tube and epicardial pacing leads. Uncertain if mediastinal drain is in position. Surgical changes of prior left atrial mass resection and left atrial amputation. Signed, Dulcy Fanny. Earleen Newport, DO [facility] Radiology Electronically Signed   By: Corrie Mckusick D.O.   On: 12/21/2015 08:04   Dg Chest Port 1 View  12/20/2015  CLINICAL DATA:  Status post left atrial mass resection. EXAM: PORTABLE CHEST 1 VIEW COMPARISON:  12/18/2015 and prior exam FINDINGS: Left atrial appendage clip and right thoracostomy tube again noted. No  pneumothorax identified. Bibasilar atelectasis and very small left pleural effusion again noted. Left IJ central venous catheters have been removed. IMPRESSION: Left central venous catheter removal without other significant change. Continued bilateral lower lung atelectasis and small left pleural effusion. Electronically Signed   By: Margarette Canada M.D.   On: 12/20/2015 08:40   Dg Chest [facility]  1 View  12/18/2015  CLINICAL DATA:  Atelectasis.  Chest tube placement. EXAM: PORTABLE CHEST 1 VIEW COMPARISON:  12/17/2015 FINDINGS: Swan-Ganz catheter has been removed. Central venous catheter sheath and central venous catheter from left internal jugular approach are stable in position. Right-sided chest tube is also stable. Left atrial appendage clip is again seen. Cardiomediastinal silhouette is normal. Mediastinal contours appear intact. There is no evidence of pneumothorax. There is persistent bibasilar atelectasis and probable bilateral small pleural effusions. More confluent airspace consolidation. Left lower lobe cannot be excluded. Osseous structures are without acute abnormality. Soft tissues are grossly normal. IMPRESSION: Removal of Swan Ganz catheter, otherwise stable appearance of support apparatus. Persistent bibasilar opacities likely representing atelectasis and small bilateral pleural effusions. More confluent airspace consolidation in the left lower lobe cannot be excluded. Electronically Signed   By: Fidela Salisbury M.D.   On: 12/18/2015 07:44   Dg Chest Port 1 View  12/17/2015  CLINICAL DATA:  Open heart surgery. EXAM: PORTABLE CHEST 1 VIEW COMPARISON:  12/16/2015. FINDINGS: Interim extubation removal of NG tube. Right chest tube, left IJ line, Swan-Ganz catheter stable position. Heart size stable. Left atrial appendage clip in stable position. Low lung volumes with slight improvement of bibasilar atelectasis and/or infiltrates. No prominent pleural effusion. No pneumothorax. IMPRESSION: 1. Interim  removal of endotracheal tube and NG tube. Remaining lines and tubes including right chest tube and Swan-Ganz catheter stable position. No pneumothorax. 2. Persistent low lung volumes with bibasilar atelectasis and/or infiltrates. Interim slight improvement from prior exam. Electronically Signed   By: [place]   On: 12/17/2015 07:18   Dg Chest Port 1 View  12/16/2015  CLINICAL DATA:  Status post CABG. EXAM: PORTABLE CHEST 1 VIEW COMPARISON:  12/14/2015 FINDINGS: Endotracheal tube overlies the tracheal air column and terminates 2.5 cm above the carina. Right chest tube and mediastinal drains are in place. Left internal jugular approach central venous catheter terminates in the expected location of mid superior vena cava. Swan-Ganz catheter tip overlies the expected location of the pulmonary trunk. Left atrial appendage clip is seen. No sternal wires are present. Cardiomediastinal silhouette is normal. Mediastinal contours appear intact. There is no evidence of pneumothorax. There is right lower lobe focal airspace consolidation and/or right pleural effusion. Osseous structures are without acute abnormality. Soft tissues are grossly normal. IMPRESSION: Interval development of right lower lobe airspace consolidation and right pleural effusion. Postsurgical support apparatus as above. Electronically Signed   By: Fidela Salisbury M.D.   On: 12/16/2015 15:53    Labs:  CBC:  Recent Labs  12/19/15 0311 12/21/15 0258 12/26/15 0925 12/27/15 0226  WBC 9.1 8.5 24.6* 21.6*  HGB 9.1* 9.5* 9.9* 8.1*  HCT 29.4* 30.3* 32.0* 26.5*  PLT 113* 200 442* 370    COAGS:  Recent Labs  11/06/15 1017 12/14/15 1005 12/16/15 1505 12/26/15 0925 12/27/15 0226 12/27/15 0723  INR 1.16 1.04 1.38 2.13*  --  1.98*  APTT 31 26 29   --  42*  --     BMP:  Recent Labs  12/19/15 0311 12/22/15 0332 12/26/15 0925 12/27/15 0226  NA 139 138 134* 135  K 4.0 3.6 3.3* 3.5  CL 108 101 95* 102  CO2 26 27 28 23     GLUCOSE 114* 96 132* 133*  BUN 17 14 26* 20  CALCIUM 8.6* 8.6* 8.4* 7.7*  CREATININE 1.00 1.05* 1.64* 1.60*  GFRNONAA >60 59* 34* 36*  GFRAA >60 >60 40* 41*    LIVER FUNCTION TESTS:  Recent Labs  02/04/15 0205 10/27/15 0950 12/14/15 1005 12/26/15 0925  BILITOT 0.5 0.5 0.7 1.0  AST 46* 15 17 26   ALT 51* 13* 14 17  ALKPHOS 92 98 101 86  PROT 6.2 7.0 6.6 6.9  ALBUMIN 2.8* 3.6 3.5 3.0*    TUMOR MARKERS: No results for input(s): AFPTM, CEA, CA199, CHROMGRNA in the last 8760 hours.  Assessment and Plan:  S/P excision of atrial mass and MAZE procedure on 12/16/2015. Discharged home on 12/22/2015  Returned 12/26/2015 with right chest pain.  Empyema on CT  Eliquis held - last dose 12/26/2015 per pt. INR 1.98  Since patient is currently stable, no URGENT need for placement of perc drain.   Will follow anticoagulation guidelines and wait 48 hours after last dose of Eliquis and plan for percutaneous drain placement tomorrow.    However, if patients condition worsens, could consider thoracentesis as a more urgent option.  INR ordered for am.  NPO after MN except medications.  Risks and Benefits discussed with the patient including bleeding, infection, damage to adjacent structures, bowel perforation/fistula connection, and sepsis.  All of the patient's questions were answered, patient is agreeable to proceed. Consent signed and in chart.  Thank you for this interesting consult.  I greatly enjoyed meeting Miranda Taylor and look forward to participating in their care.  A copy of this report was sent to the requesting provider on this date.  Electronically Signed: Murrell Redden PA-C 12/27/2015, 9:10 AM   I spent a total of 40 Minutes in face to face in clinical consultation, greater than 50% of which was counseling/coordinating care for perc drain placement to treat empyema.

## 2015-12-27 NOTE — Progress Notes (Addendum)
Pharmacy Antibiotic Note  Miranda Taylor is a 55 y.o. female admitted on 12/26/2015 with pneumonia.  Pharmacy has been consulted for vancomycin and cefepime dosing.  Plan: The dose of vancomycin 1,250 mg IV q24h will be adjusted to 750 mg IV q12h based on renal function.  Change cefepime to 2 gm IV q24h F/u cx, LOT, narrowing  Height: 5\' 7"  (170.2 cm) Weight: 202 lb 2.6 oz (91.7 kg) IBW/kg (Calculated) : 61.6  Temp (24hrs), Avg:99 F (37.2 C), Min:97.2 F (36.2 C), Max:102 F (38.9 C)   Recent Labs Lab 12/21/15 0258 12/22/15 0332 12/26/15 0925 12/26/15 1122 12/26/15 1436 12/27/15 0226  WBC 8.5  --  24.6*  --   --  21.6*  CREATININE  --  1.05* 1.64*  --   --  1.60*  LATICACIDVEN  --   --   --  1.30 2.91*  --     Estimated Creatinine Clearance: 46.7 mL/min (by C-G formula based on Cr of 1.6).    No Known Allergies  Antimicrobials this admission: Cefepime 2/18 >>  Vancomycin 2/18 >>   Dose adjustments this admission: n/a  Microbiology results: Blood cx 2/18 > sent Urine cx 2/18 > sent  Thank you for allowing pharmacy to be a part of this patient's care.  Cassie L. Nicole Kindred, PharmD PGY2 Infectious Diseases Pharmacy Resident Pager: [phone] 12/27/2015 10:09 AM

## 2015-12-27 NOTE — Progress Notes (Signed)
Verbal order from [facility] to discontinue heparin and heparin levels at [place] because patient will go to surgery on 12/28/15.

## 2015-12-27 NOTE — Progress Notes (Signed)
*  Preliminary Results* Bilateral lower extremity venous duplex completed. Study technically difficult due to patient position and guarding secondary to pain. Bilateral lower extremities are negative for deep vein thrombosis. There is no evidence of Baker's cyst bilaterally.  12/27/2015  Maudry Mayhew, RVT, RDCS, RDMS

## 2015-12-28 ENCOUNTER — Ambulatory Visit: Payer: BLUE CROSS/BLUE SHIELD | Admitting: Thoracic Surgery (Cardiothoracic Vascular Surgery)

## 2015-12-28 ENCOUNTER — Inpatient Hospital Stay (HOSPITAL_COMMUNITY): Payer: BLUE CROSS/BLUE SHIELD

## 2015-12-28 ENCOUNTER — Ambulatory Visit: Payer: Self-pay | Admitting: Thoracic Surgery (Cardiothoracic Vascular Surgery)

## 2015-12-28 ENCOUNTER — Encounter (HOSPITAL_COMMUNITY): Payer: Self-pay | Admitting: Thoracic Surgery (Cardiothoracic Vascular Surgery)

## 2015-12-28 HISTORY — PX: CHEST TUBE INSERTION: SHX231

## 2015-12-28 LAB — BASIC METABOLIC PANEL
ANION GAP: 8 (ref 5–15)
BUN: 10 mg/dL (ref 6–20)
CALCIUM: 8.2 mg/dL — AB (ref 8.9–10.3)
CO2: 22 mmol/L (ref 22–32)
Chloride: 105 mmol/L (ref 101–111)
Creatinine, Ser: 0.91 mg/dL (ref 0.44–1.00)
Glucose, Bld: 113 mg/dL — ABNORMAL HIGH (ref 65–99)
POTASSIUM: 3.3 mmol/L — AB (ref 3.5–5.1)
SODIUM: 135 mmol/L (ref 135–145)

## 2015-12-28 LAB — CBC
HEMATOCRIT: 25.2 % — AB (ref 36.0–46.0)
HEMOGLOBIN: 8 g/dL — AB (ref 12.0–15.0)
MCH: 28.2 pg (ref 26.0–34.0)
MCHC: 31.7 g/dL (ref 30.0–36.0)
MCV: 88.7 fL (ref 78.0–100.0)
Platelets: 337 10*3/uL (ref 150–400)
RBC: 2.84 MIL/uL — ABNORMAL LOW (ref 3.87–5.11)
RDW: 15.2 % (ref 11.5–15.5)
WBC: 18.5 10*3/uL — AB (ref 4.0–10.5)

## 2015-12-28 LAB — GRAM STAIN

## 2015-12-28 LAB — PROTIME-INR
INR: 1.5 — ABNORMAL HIGH (ref 0.00–1.49)
PROTHROMBIN TIME: 18.2 s — AB (ref 11.6–15.2)

## 2015-12-28 LAB — PROCALCITONIN: PROCALCITONIN: 1.44 ng/mL

## 2015-12-28 LAB — APTT
APTT: 36 s (ref 24–37)
APTT: 59 s — AB (ref 24–37)

## 2015-12-28 MED ORDER — MIDAZOLAM HCL 2 MG/2ML IJ SOLN
INTRAMUSCULAR | Status: AC | PRN
  Administered 2015-12-28: 1 mg via INTRAVENOUS

## 2015-12-28 MED ORDER — FENTANYL CITRATE (PF) 100 MCG/2ML IJ SOLN
INTRAMUSCULAR | Status: AC | PRN
  Administered 2015-12-28: 50 ug via INTRAVENOUS

## 2015-12-28 MED ORDER — MIDAZOLAM HCL 2 MG/2ML IJ SOLN
INTRAMUSCULAR | Status: AC
  Filled 2015-12-28: qty 4

## 2015-12-28 MED ORDER — LIDOCAINE HCL 1 % IJ SOLN
INTRAMUSCULAR | Status: AC
  Filled 2015-12-28: qty 20

## 2015-12-28 MED ORDER — FENTANYL CITRATE (PF) 100 MCG/2ML IJ SOLN
INTRAMUSCULAR | Status: AC
Start: 2015-12-28 — End: 2015-12-28
  Filled 2015-12-28: qty 4

## 2015-12-28 MED ORDER — POTASSIUM CHLORIDE CRYS ER 20 MEQ PO TBCR
40.0000 meq | EXTENDED_RELEASE_TABLET | Freq: Two times a day (BID) | ORAL | Status: AC
  Administered 2015-12-28 (×2): 40 meq via ORAL
  Filled 2015-12-28 (×2): qty 2

## 2015-12-28 NOTE — Sedation Documentation (Signed)
Patient denies pain and is resting comfortably. Pt with no concerns at this time, will continue to monitor

## 2015-12-28 NOTE — Sedation Documentation (Signed)
Patient is resting comfortably. Pt denies any pain, will continue to monitor

## 2015-12-28 NOTE — Progress Notes (Signed)
PULMONARY / CRITICAL CARE MEDICINE   Name: Miranda Taylor MRN: [id number] DOB: [birth year]    ADMISSION DATE:  12/26/2015 CONSULTATION DATE:  12/26/15  REFERRING MD:  Dr. 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     She presented today with subjective fevers, right-sided pleuritic chest pain and leukocytosis. Chest x-ray showed an CT chest confirmed loculated pleural fluid on the right lower lobe and within the fissure. Lactate was 1.3 and increased to 2.9    SUBJECTIVE:  defervesced Improved pain on PCA  VITAL SIGNS: BP 118/67 mmHg  Pulse 93  Temp(Src) 98.8 F (37.1 C) (Oral)  Resp 20  Ht 5\' 7"  (1.702 m)  Wt 91.7 kg (202 lb 2.6 oz)  BMI 31.66 kg/m2  SpO2 97%  LMP 12/25/2012  HEMODYNAMICS:    VENTILATOR SETTINGS:    INTAKE / OUTPUT: I/O last 3 completed shifts: In: 4357.8 [P.O.:840; I.V.:3117.8; IV Piggyback:400] Out: 2650 [Urine:2650]  PHYSICAL EXAMINATION: General:  Well developed female , lying supine in bed Neuro:  AAOx4, speech clear, MAE HEENT:  MM pink/dry, no jvd Cardiovascular:  s1s2 rrr, no m/r/g, SB to SR on monitor  Lungs:  Even/non-labored, decreased rt base Abdomen:  Obese/soft, bsx4 active  Musculoskeletal:  No acute deformities  Skin:  R groin incision erythematous, small area of induration, no drainage from site.  R anterior chest incision well approximated, no erythema or drainage, R lateral chest tube sites with mild erythema, no drainage from site   LABS:  BMET  Recent Labs Lab 12/26/15 0925 12/27/15 0226 12/28/15 0641   NA 134* 135 135  K 3.3* 3.5 3.3*  CL 95* 102 105  CO2 28 23 22   BUN 26* 20 10  CREATININE 1.64* 1.60* 0.91  GLUCOSE 132* 133* 113*    Electrolytes  Recent Labs Lab 12/26/15 0925 12/27/15 0226 12/28/15 0641  CALCIUM 8.4* 7.7* 8.2*    CBC  Recent Labs Lab 12/26/15 0925 12/27/15 0226 12/28/15 0641  WBC 24.6* 21.6* 18.5*  HGB 9.9* 8.1* 8.0*  HCT 32.0* 26.5* 25.2*  PLT 442* 370 337    Coag's  Recent Labs Lab 12/26/15 0925  12/27/15 0723 12/27/15 1635 12/27/15 2351 12/28/15 0641  APTT  --   < >  --  79* 59* 36  INR 2.13*  --  1.98*  --   --  1.50*  < > = values in this interval not displayed.  Sepsis Markers  Recent Labs Lab 12/26/15 1122 12/26/15 1436 12/27/15 0226 12/28/15 0641  LATICACIDVEN 1.30 2.91*  --   --   PROCALCITON  --   --  2.70 1.44    ABG No results for input(s): PHART, PCO2ART, PO2ART in the last 168 hours.  Liver Enzymes  Recent Labs Lab 12/26/15 0925  AST 26  ALT 17  ALKPHOS 86  BILITOT 1.0  ALBUMIN 3.0*    Cardiac Enzymes No results for input(s): TROPONINI, PROBNP in the last 168 hours.  Glucose  Recent Labs Lab 12/26/15 1559  GLUCAP 105*    Imaging No results found.   STUDIES:  ABD [place] 2/18 >>  no acute findings, right renal cysts CT Chest 2/18 >> loculated pleural fluid on right, primarily RLL, atelectasis, small left pleural effusion, L lower atelectasis, no PTX, R anterior chest wall subcutaneous emphysema   CULTURES: BCx2 2/18 >>ng UC 2/18 >>    ANTIBIOTICS: Vanco 2/18 >>  Cefepime 2/18 >>   SIGNIFICANT EVENTS: 2/8-2/14  Admit for MAZE procedure, resection of left atrial appendage mass 2/18  Admit for right sided chest pain, loculated pleural effusions on CT  LINES/TUBES:   DISCUSSION: 55 y/o F, former smoker, with a recent admission for MAZE procedure for atrial fibrillation and resection of left atrial appendage mass (fibroelastoma) readmitted 2/18 with right sided chest pain.  Found to  have loculated R effusions  ASSESSMENT / PLAN:  PULMONARY A: Loculated Right Pleural Effusion (s) - apical, lower and in fissure P:   O2 if needed for sats > 92% Pulmonary hygiene TCTS  following IR evaluation for possible CT guided sampling of pleural fluid    CARDIOVASCULAR A:  Right Chest Pain - post MAZE with loculated effusions, r/o infectious process, bleeding Hx MAZE procedure 2/8 per Dr. Roxy Manns for left atrial appendage mass (fibroelastoma), s/p ablation for Atrial Fibrillation Prolonged QTc - noted on initial EKG in ER, 655  Hx PAF, HTN Septic shock- resolved P:  Hold home eliquis, lopressor ,held Heparin gtt for procedure Continue amiodarone   RENAL A:   AKI - baseline cr 0.8 Hypokalemia  Pseudohypocalcemia - corrects to 9.2 for albumin P:   Trend BMP / UOP  Replace electrolytes as indicated     HEMATOLOGIC A:   Anemia  Coagulopathy - on Eliquis P:  Trend CBC Hold Eliquis for now SCD's   INFECTIOUS A:   Pleural Effusion - loculated on R, DDx includes infectious etiology, concern for  effusion vs bleeding UTI possible Pct decreasing P:   Await urine cx    NEUROLOGIC A:   Anxiety  Pain - post procedure P:   PCA per TCTS    Summary - septic Shock resolved, await IR drain & urine cx, can resume heparin gtt after procedure Transfer to tele, dr Roxy Manns to assume care, PCCM available as needed  Kara Mead MD. [facility]. [place] [facility] Pager [phone] If no response call [phone]     12/28/2015, 1:16 PM

## 2015-12-28 NOTE — Procedures (Signed)
Interventional Radiology Procedure Note  Procedure: Placement of a right pleural drain, for suspected empyema. 41F pigtail via CT guidance.   Complications: None.  Recommendations:  - Follow up lab, 60 cc aspirated for sample - 41F drain to water-seal/suction device - Routine care   Signed,  Dulcy Fanny. Earleen Newport, DO

## 2015-12-28 NOTE — Sedation Documentation (Signed)
Patient denies pain and is resting comfortably.  

## 2015-12-28 NOTE — Sedation Documentation (Signed)
Patient is resting comfortably. Pt denies any pain at this time. 

## 2015-12-28 NOTE — Progress Notes (Signed)
Interventional Radiology Progress Note  55 yo female, SP 10 F pigtail drain into the right pleural effusion/empyema.    No complication with the case.  Patient now C/O pain at the site of the drain, designating the discomfort at the ribs.  No SOB.  Vitals normal with 100% o2 sat.    900cc of amber fluid aspirated.    Potentially drain is irritating either the adjacent rib or possibly the pleura.    Continue Atrium drainage until initial cultures are returned.   Call with questions/concerns   Signed,  Dulcy Fanny. Earleen Newport, DO

## 2015-12-28 NOTE — Care Management Note (Addendum)
Case Management Note  Patient Details  Name: Miranda Taylor MRN: [id number] Date of Birth: December 13, 1960  Subjective/Objective:   Pt admitted with sepsis due to possible complication of last admit - minimally invasive resection of left atrial mass and maze procedure on 2/8                Action/Plan:  Pt is independent from home with husband - CM will continue to monitor for disposition needs   Expected Discharge Date:                  Expected Discharge Plan:  Home/Self Care  In-House Referral:     Discharge planning Services  CM Consult  Post Acute Care Choice:    Choice offered to:     DME Arranged:    DME Agency:     HH Arranged:    [place] Agency:     Status of Service:  In process, will continue to follow  Medicare Important Message Given:    Date Medicare IM Given:    Medicare IM give by:    Date Additional Medicare IM Given:    Additional Medicare Important Message give by:     If discussed at [place] of Stay Meetings, dates discussed:    Additional Comments:  Maryclare Labrador, RN 12/28/2015, 11:11 AM

## 2015-12-28 NOTE — Progress Notes (Signed)
[address]       [place] [address]             [phone]        CARDIOTHORACIC SURGERY PROGRESS NOTE  Subjective: Some soreness in chest but much improved.  No SOB.  No cough.  Appetite okay and now abdominal pain, normal bowel function.  Denies dysuria.  Objective: Vital signs: BP Readings from Last 1 Encounters:  12/28/15 99/81   Pulse Readings from Last 1 Encounters:  12/28/15 92   Resp Readings from Last 1 Encounters:  12/28/15 20   Temp Readings from Last 1 Encounters:  12/28/15 98.8 F (37.1 C) Oral    Hemodynamics:    Physical Exam:  Rhythm:   sinus  Breath sounds: Diminished at bases, o/w clear  Heart sounds:  RRR  Incisions:  Clean and dry, minimal serous drainage from old chest tube incisions  Abdomen:  Soft, non-distended, non-tender=  Extremities:  Warm, well-perfused    Intake/Output from previous day: 02/19 0701 - 02/20 0700 In: 3187.8 [P.O.:720; I.V.:2117.8; IV Piggyback:350] Out: 1800 [Urine:1800] Intake/Output this shift:    Lab Results:  CBC: Recent Labs  12/27/15 0226 12/28/15 0641  WBC 21.6* 18.5*  HGB 8.1* 8.0*  HCT 26.5* 25.2*  PLT 370 337    BMET:  Recent Labs  12/27/15 0226 12/28/15 0641  NA 135 135  K 3.5 3.3*  CL 102 105  CO2 23 22  GLUCOSE 133* 113*  BUN 20 10  CREATININE 1.60* 0.91  CALCIUM 7.7* 8.2*     PT/INR:   Recent Labs  12/28/15 0641  LABPROT 18.2*  INR 1.50*    CBG (last 3)   Recent Labs  12/26/15 1559  GLUCAP 105*    ABG    Component Value Date/Time   PHART 7.345* 12/17/2015 0013   PCO2ART 36.0 12/17/2015 0013   PO2ART 126.0* 12/17/2015 0013   HCO3 19.6* 12/17/2015 0013   TCO2 21 12/17/2015 1709   ACIDBASEDEF 5.0* 12/17/2015 0013   O2SAT 99.0 12/17/2015 0013    CXR: PORTABLE CHEST 1 VIEW  COMPARISON: 12/26/2015 and chest CT dated 12/26/2015.  FINDINGS: The cardiac silhouette remains borderline enlarged. Increased density at both lung bases.  Stable diffuse prominence of the interstitial markings. Unremarkable bones.  IMPRESSION: 1. Increased bibasilar atelectasis or pneumonia. 2. Mildly increased bibasilar pleural fluid. 3. Stable interstitial lung disease.   Electronically Signed  By: Claudie Revering M.D.  On: 12/27/2015 07:53   Transthoracic Echocardiography  Patient:  Lynna, Carlsson MR #:    [id number] Study Date: 12/27/2015 Gender:   F Age:    64 Height:   170.2 cm Weight:   91.6 kg BSA:    2.11 m^2 Pt. Status: Room:    2M02C  Aileen Pilot  Dahlia Byes ATTENDING  Fredia Sorrow [id number] Judith Blonder SONOGRAPHER Leavy Cella PERFORMING  [facility], Inpatient  cc:  ------------------------------------------------------------------- LV EF: 55% -  60%  ------------------------------------------------------------------- Indications:   Fever 780.6.  ------------------------------------------------------------------- History:  PMH: Former Smoker, Pleural Effusion, GERD, Atrial Flutter, Atrial Mass, S/P Minimally invasive resection of papillary fibroelastoma. Atrial flutter. Coronary artery disease.  ------------------------------------------------------------------- Study Conclusions  - Left ventricle: The cavity size was normal. Wall thickness was increased in a pattern of mild LVH. Systolic function was normal. The estimated ejection fraction was in the range of 55% to 60%. Features are consistent with a pseudonormal left ventricular filling pattern, with concomitant abnormal  relaxation and increased filling pressure (grade 2 diastolic dysfunction). Doppler parameters are consistent with high ventricular filling pressure. - Aortic valve: Mildly calcified annulus. Trileaflet; mildly thickened leaflets. Valve area (VTI): 2.54 cm^2. Valve area (Vmax): 2.88 cm^2. - Left atrium: The atrium was  mildly dilated. - Right ventricle: The cavity size was mildly dilated. Systolic function was low normal. - Technically difficult study.  Transthoracic echocardiography. M-mode, complete 2D, spectral Doppler, and color Doppler. Birthdate: Patient birthdate: 07-01-1961. Age: Patient is 55 yr old. Sex: Gender: female. BMI: 31.6 kg/m^2. Blood pressure:   123/67 Patient status: Inpatient. Study date: Study date: 12/27/2015. Study time: 03:48 PM. Location: Bedside.  -------------------------------------------------------------------  ------------------------------------------------------------------- Left ventricle: The cavity size was normal. Wall thickness was increased in a pattern of mild LVH. Systolic function was normal. The estimated ejection fraction was in the range of 55% to 60%. Images were inadequate for LV wall motion assessment. Features are consistent with a pseudonormal left ventricular filling pattern, with concomitant abnormal relaxation and increased filling pressure (grade 2 diastolic dysfunction). Doppler parameters are consistent with high ventricular filling pressure.  ------------------------------------------------------------------- Aortic valve:  Mildly calcified annulus. Trileaflet; mildly thickened leaflets. Doppler:  There was no stenosis.  There was no significant regurgitation.  VTI ratio of LVOT to aortic valve: 0.81. Valve area (VTI): 2.54 cm^2. Indexed valve area (VTI): 1.2 cm^2/m^2. Peak velocity ratio of LVOT to aortic valve: 0.92. Valve area (Vmax): 2.88 cm^2. Indexed valve area (Vmax): 1.36 cm^2/m^2. Mean gradient (S): 6 mm Hg. Peak gradient (S): 14 mm Hg.  ------------------------------------------------------------------- Aorta: Aortic root: The aortic root was normal in size.  ------------------------------------------------------------------- Mitral valve:  Normal thickness leaflets . Doppler:  There was no  evidence for stenosis.  There was no significant regurgitation.  Peak gradient (D): 9 mm Hg.  ------------------------------------------------------------------- Left atrium: The atrium was mildly dilated.  ------------------------------------------------------------------- Pulmonary veins: There is blunting of systolic pulmonary vein flow, consistent with elevated LA pressure.  ------------------------------------------------------------------- Right ventricle: The cavity size was mildly dilated. Systolic function was low normal.  ------------------------------------------------------------------- Pulmonic valve:  Not well visualized. Doppler:  There was no evidence for stenosis.  There was no significant regurgitation.  ------------------------------------------------------------------- Tricuspid valve:  Normal thickness leaflets. Doppler:  There was no evidence for stenosis.  There was mild regurgitation.  ------------------------------------------------------------------- Pulmonary artery:  Systolic pressure was within the normal range.  ------------------------------------------------------------------- Right atrium: The atrium was normal in size.  ------------------------------------------------------------------- Pericardium: Not well visualized. There was no pericardial effusion.  ------------------------------------------------------------------- Systemic veins: Inferior vena cava: The vessel was normal in size. The respirophasic diameter changes were in the normal range (>= 50%), consistent with normal central venous pressure.  ------------------------------------------------------------------- Measurements  Left ventricle              Value      Reference LV ID, ED, PLAX chordal     (L)   37.4  mm    43 - 52 LV ID, ES, PLAX chordal         23.6  mm    23 - 38 LV fx shortening, PLAX chordal       37   %    >=29 LV PW thickness, ED           10.1  mm    --------- IVS/LV PW ratio, ED           1.11      <=1.3  Ventricular septum            Value  Reference IVS thickness, ED            11.2  mm    ---------  LVOT                   Value      Reference LVOT ID, S                20   mm    --------- LVOT area                3.14  cm^2   --------- LVOT peak velocity, S          168.48 cm/s   --------- LVOT VTI, S               25.15 cm    --------- LVOT peak gradient, S          11   mm Hg  ---------  Aortic valve               Value      Reference Aortic valve peak velocity, S      183.97 cm/s   --------- Aortic valve mean velocity, S      113.17 cm/s   --------- Aortic valve VTI, S           31.08 cm    --------- Aortic mean gradient, S         6   mm Hg  --------- Aortic peak gradient, S         14   mm Hg  --------- VTI ratio, LVOT/AV            0.81      --------- Aortic valve area, VTI          2.54  cm^2   --------- Aortic valve area/bsa, VTI        1.2  cm^2/m^2 --------- Velocity ratio, peak, LVOT/AV      0.92      --------- Aortic valve area, peak velocity     2.88  cm^2   --------- Aortic valve area/bsa, peak       1.36  cm^2/m^2 --------- velocity  Aorta                  Value      Reference Aortic root ID, ED            34   mm    ---------  Left atrium               Value      Reference LA ID, A-P, ES              36   mm    --------- LA volume/bsa, S             30   ml/m^2  ---------  Mitral valve                Value      Reference Mitral E-wave peak velocity       153  cm/s   --------- Mitral deceleration time         211  ms    150 - 230 Mitral peak gradient, D         9   mm Hg  ---------  Pulmonary arteries            Value      Reference PA pressure, S, DP  27   mm Hg  <=30  Right ventricle             Value      Reference TAPSE                  16   mm    ---------  Legend: (L) and (H) mark values outside specified reference range.  ------------------------------------------------------------------- Prepared and Electronically Authenticated by  Kerry Hough, M.D. 2017-02-19T17:07:57  Assessment/Plan:  Clinically looks good.  Fevers and leukocytosis resolving.  All cultures negative to date.  Surgical incisions look good.  Await CT-guided drainage of loculated right posterior pleural effusion.  She remains in sinus rhythm and the LA appendage was clipped.  There is no need to bridge with w/ intravenous heparin.    Okay for transfer 2W stepdown  Rexene Alberts, MD 12/28/2015 8:29 AM

## 2015-12-29 ENCOUNTER — Encounter (HOSPITAL_COMMUNITY): Payer: Self-pay | Admitting: General Practice

## 2015-12-29 ENCOUNTER — Inpatient Hospital Stay (HOSPITAL_COMMUNITY): Payer: BLUE CROSS/BLUE SHIELD

## 2015-12-29 LAB — BASIC METABOLIC PANEL
ANION GAP: 10 (ref 5–15)
BUN: 7 mg/dL (ref 6–20)
CALCIUM: 9 mg/dL (ref 8.9–10.3)
CO2: 24 mmol/L (ref 22–32)
CREATININE: 0.91 mg/dL (ref 0.44–1.00)
Chloride: 103 mmol/L (ref 101–111)
GLUCOSE: 129 mg/dL — AB (ref 65–99)
Potassium: 4.6 mmol/L (ref 3.5–5.1)
Sodium: 137 mmol/L (ref 135–145)

## 2015-12-29 LAB — STREP PNEUMONIAE URINARY ANTIGEN: STREP PNEUMO URINARY ANTIGEN: NEGATIVE

## 2015-12-29 LAB — CBC
HEMATOCRIT: 29 % — AB (ref 36.0–46.0)
Hemoglobin: 8.7 g/dL — ABNORMAL LOW (ref 12.0–15.0)
MCH: 26.7 pg (ref 26.0–34.0)
MCHC: 30 g/dL (ref 30.0–36.0)
MCV: 89 fL (ref 78.0–100.0)
PLATELETS: 454 10*3/uL — AB (ref 150–400)
RBC: 3.26 MIL/uL — ABNORMAL LOW (ref 3.87–5.11)
RDW: 15.1 % (ref 11.5–15.5)
WBC: 16.7 10*3/uL — AB (ref 4.0–10.5)

## 2015-12-29 LAB — URINE CULTURE

## 2015-12-29 LAB — PROCALCITONIN: PROCALCITONIN: 0.88 ng/mL

## 2015-12-29 MED ORDER — CEFEPIME HCL 2 G IJ SOLR
2.0000 g | Freq: Two times a day (BID) | INTRAMUSCULAR | Status: DC
  Administered 2015-12-29 – 2015-12-31 (×5): 2 g via INTRAVENOUS
  Filled 2015-12-29 (×6): qty 2

## 2015-12-29 MED ORDER — MAGNESIUM HYDROXIDE 400 MG/5ML PO SUSP
30.0000 mL | Freq: Every day | ORAL | Status: DC | PRN
  Administered 2015-12-29 – 2015-12-31 (×2): 30 mL via ORAL
  Filled 2015-12-29 (×2): qty 30

## 2015-12-29 MED ORDER — ENOXAPARIN SODIUM 30 MG/0.3ML ~~LOC~~ SOLN
30.0000 mg | SUBCUTANEOUS | Status: DC
  Administered 2015-12-29 – 2015-12-31 (×3): 30 mg via SUBCUTANEOUS
  Filled 2015-12-29 (×3): qty 0.3

## 2015-12-29 MED ORDER — FLUCONAZOLE 100 MG PO TABS
150.0000 mg | ORAL_TABLET | Freq: Once | ORAL | Status: AC
  Administered 2015-12-29: 150 mg via ORAL
  Filled 2015-12-29: qty 2

## 2015-12-29 NOTE — Progress Notes (Signed)
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  She states she has been up walking around and up to the chair.  No new complaints.  Allergies: Review of patient's allergies indicates no known allergies.  Medications: Prior to Admission medications   Medication Sig Start Date End Date Taking? Authorizing Provider  amiodarone (PACERONE) 200 MG tablet Take 1 tablet (200 mg total) by mouth daily. 12/22/15  Yes Donielle Liston Alba, PA-C  apixaban (ELIQUIS) 5 MG TABS tablet Take 1 tablet (5 mg total) by mouth 2 (two) times daily. 10/19/15  Yes Rhonda G Barrett, PA-C  cycloSPORINE (RESTASIS) 0.05 % ophthalmic emulsion Place 1 drop into both eyes 2 (two) times daily as needed (for dry eyes).    Yes Historical Provider, MD  desvenlafaxine (PRISTIQ) 50 MG 24 hr tablet Take 100 mg by mouth daily before breakfast.    Yes Historical Provider, MD  furosemide (LASIX) 40 MG tablet For 5 days then stop. 12/22/15  Yes Donielle Liston Alba, PA-C  LORazepam (ATIVAN) 0.5 MG tablet Take 0.5 mg by mouth at bedtime.    Yes Historical Provider, MD  metoprolol tartrate (LOPRESSOR) 25 MG tablet Take 0.5 tablets (12.5 mg total) by mouth 2 (two) times daily. 12/22/15  Yes Donielle Liston Alba, PA-C  Multiple Vitamin (MULITIVITAMIN WITH MINERALS) TABS Take 1 tablet by mouth daily.   Yes Historical Provider, MD  NEXIUM 40 MG capsule Take 40 mg by mouth daily as needed (for heartburn).  06/25/12  Yes Historical Provider, MD  ondansetron (ZOFRAN ODT) 4 MG disintegrating tablet Take 1 tablet (4 mg total) by mouth every 8 (eight) hours as needed for nausea or vomiting. 12/22/15  Yes Donielle Liston Alba, PA-C  oxyCODONE (OXY IR/ROXICODONE) 5 MG immediate release tablet Take 1-2 tablets (5-10 mg total) by mouth every 4 (four) hours as needed for severe pain. 12/22/15  Yes Donielle Liston Alba, PA-C  polyethylene glycol powder  (GLYCOLAX/MIRALAX) powder Take 17 g by mouth every other day. 10/29/15  Yes Historical Provider, MD  potassium chloride (K-DUR) 10 MEQ tablet For 5 days then stop. 12/22/15  Yes Donielle Liston Alba, PA-C  rizatriptan (MAXALT-MLT) 10 MG disintegrating tablet Take 10 mg by mouth daily as needed for migraine.  02/26/13  Yes Historical Provider, MD  triamcinolone cream (KENALOG) 0.1 % Apply 1 application topically daily as needed (for rash). Apply after bathing 10/22/15  Yes Historical Provider, MD  zolpidem (AMBIEN) 5 MG tablet Take 5 mg by mouth at bedtime. 11/17/15  Yes Historical Provider, MD     Vital Signs: BP 108/50 mmHg  Pulse 89  Temp(Src) 99 F (37.2 C) (Oral)  Resp 25  Ht 5\' 7"  (1.702 m)  Wt 194 lb 1.6 oz (88.043 kg)  BMI 30.39 kg/m2  SpO2 98%  LMP 12/25/2012  Physical Exam Awake and Alert NAD Heart RRR Lungs clear anteriorly. Chest tube/catheter in place. Intact Output ~1100 mls clear yellow  Imaging: Dg Chest 2 View  12/29/2015  CLINICAL DATA:  Pleural effusion with drainage EXAM: CHEST  2 VIEW COMPARISON:  12/27/2015 FINDINGS: Pigtail drain in the right posterior pleural space in good position. Improvement in right posterior pleural effusion. There remains pleural effusion in the right lung base and anteriorly. Hydro pneumothorax noted in the right lung base. Small left effusion unchanged with left lower lobe atelectasis. Progression of vascular congestion and bilateral airspace disease right greater than left, most consistent with  heart failure and edema. IMPRESSION: Pigtail catheter right posterior pleural space in good position. Decreased right pleural effusion. Small hydro pneumothorax in the right lung base Left lower lobe atelectasis and effusion Vascular congestion with bilateral airspace disease right greater than left, probable heart failure versus pneumonia. Electronically Signed   By: Franchot Gallo M.D.   On: 12/29/2015 07:47   Dg Chest 2 View  12/26/2015   CLINICAL DATA:  Initial encounter. 55 y/o female with recent heart surgery (removed of benign tumor) that was discharged Tuesday. Here with c/o SOB, fever, nausea. Right chest pain with redness, probably from chest tubes Hx arrythmia, ex-smoker EXAM: CHEST  2 VIEW COMPARISON:  12/22/2015 FINDINGS: There is increased opacity at the right lung base now obscuring the right hemidiaphragm. Opacity at the left lung base is similar to the prior study consistent with a combination of pleural fluid and atelectasis. There is no convincing pulmonary edema. No pneumothorax. Cardiac silhouette is top-normal in size. Left atrial occlusion ir device is stable. IMPRESSION: 1. New opacity at the right lung base. This is likely combination of a small effusion with either atelectasis or pneumonia or a combination. 2. No other change from the prior study. Persistent small left pleural effusion with associated atelectasis. No pulmonary edema. Electronically Signed   By: Lajean Manes M.D.   On: 12/26/2015 10:12   Ct Chest Wo Contrast  12/26/2015  CLINICAL DATA:  Recent heart surgery 12/16/15 EXAM: CT CHEST WITHOUT CONTRAST TECHNIQUE: Multidetector CT imaging of the chest was performed following the standard protocol without IV contrast. COMPARISON:  Current chest radiograph.  Chest CTA, 11/11/2015. FINDINGS: Neck base and axilla: Small thyroid nodules several calcified. No neck base or axillary masses or adenopathy. Mediastinum and hila: Heart normal in size. Left atrial occlusion device is well positioned. Mild mediastinal adenopathy. There is a 12 mm short axis right peritracheal node and a 12 mm precarinal node. No hilar masses or discrete enlarged lymph nodes. Lungs and pleura: There are areas of loculated pleural fluid on the right. Loculated pleural fluid is seen anteriorly and along the oblique fissure. There is loculated posterior medial pleural fluid which is contiguous with more free fluid at the posterior lung base. Opacity  in the right lower lobe is most likely all atelectasis. There is milder atelectasis in the right middle lobe and dependent right upper lobe. There is a small free flowing left pleural effusion. Left lower lobe opacity is also noted likely atelectasis. There is mild hazy opacity in the left upper lobe mild bilateral interstitial prominence. There is no convincing pneumothorax. There is right anterior chest wall subcutaneous emphysema that extends toward the right axilla. Limited upper abdomen: Upper pole right renal cyst. Otherwise unremarkable. IMPRESSION: 1. New opacity on the current chest radiograph is due to a combination of loculated pleural fluid on the right and right lung, primarily right lower lobe, atelectasis. A component of pneumonia is possible but felt less likely. 2. Small left pleural effusion. Partial left lower lobe atelectasis. 3. Mild interstitial prominence without overt pulmonary edema. 4. No pneumothorax. Right anterior chest wall subcutaneous emphysema. Electronically Signed   By: Lajean Manes M.D.   On: 12/26/2015 10:51   US Abdomen Complete  12/26/2015  CLINICAL DATA:  Diffuse abdominal discomfort s/p heart surgery to removed a benign mass 1 week ago. Pt states she has been nauseated since surgery and has had no appetite. Pt states most of her pain is centered around the chest tube site on the  right under the right breast. EXAM: ABDOMEN ULTRASOUND COMPLETE COMPARISON:  CT, 11/11/2015 FINDINGS: Gallbladder: No gallstones or wall thickening visualized. No sonographic Murphy sign noted by sonographer. Common bile duct: Diameter: 5.5 mm Liver: Normal in size and echogenicity.  No mass or focal lesion. IVC: No abnormality visualized. Pancreas: Suboptimally visualized.  Portions seen are unremarkable. Spleen: Size and appearance within normal limits. Right Kidney: Length: 14.4 cm. Normal parenchymal echogenicity. Mild cortical thinning. Two cysts, 1 from the upper pole measuring 5.8 cm and 1  in the lower pole measuring 4.9 cm. No hydronephrosis. Left Kidney: Length: 12.0 cm. Echogenicity within normal limits. No mass or hydronephrosis visualized. Abdominal aorta: No aneurysm visualized. Other findings: None. IMPRESSION: 1. No acute findings. No evidence acute cholecystitis or gallstones. No bile duct dilation. 2. Right renal cysts. Electronically Signed   By: Lajean Manes M.D.   On: 12/26/2015 12:56   Dg Chest Port 1 View  12/27/2015  CLINICAL DATA:  Shortness of breath. EXAM: PORTABLE CHEST 1 VIEW COMPARISON:  12/26/2015 and chest CT dated 12/26/2015. FINDINGS: The cardiac silhouette remains borderline enlarged. Increased density at both lung bases. Stable diffuse prominence of the interstitial markings. Unremarkable bones. IMPRESSION: 1. Increased bibasilar atelectasis or pneumonia. 2. Mildly increased bibasilar pleural fluid. 3. Stable interstitial lung disease. Electronically Signed   By: Claudie Revering M.D.   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 She has been referred for drain placement. EXAM: CT IMAGE GUIDED DRAINAGE BY PERCUTANEOUS CATHETER MEDICATIONS: The patient is currently admitted to the hospital and receiving intravenous antibiotics. The antibiotics were administered within an appropriate time frame prior to the initiation of the procedure. ANESTHESIA/SEDATION: Fentanyl 1.0 mcg IV; Versed 50 mg IV Moderate Sedation Time:  15 The patient was continuously monitored during the procedure by the interventional radiology nurse under my direct supervision. COMPLICATIONS: None PROCEDURE: Informed written consent was obtained from the patient after a thorough discussion of the procedural risks, benefits and alternatives. All questions were addressed. Maximal Sterile Barrier Technique was utilized including caps, mask, sterile  gowns, sterile gloves, sterile drape, hand hygiene and skin antiseptic. A timeout was performed prior to the initiation of the procedure. Patient is positioned left decubitus position on the CT gantry table. Scout CT of the chest was performed for planning purposes. The skin and subcutaneous tissues of the right posterior thorax were generously infiltrated with 1% lidocaine for local anesthesia. Using CT guidance and modified Seldinger technique, a 10 French drainage catheter was placed into the fluid collection of the right chest. 60 cc aspiration was acquired for lab analysis. Catheter was attached to waterseal/suction, with approximately 900 cc aspirated. Retention suture was placed and a sterile bandage was placed. Patient tolerated the procedure well and remained hemodynamically stable throughout. No complications were encountered and no significant blood loss encountered. IMPRESSION: Status post CT-guided 10 French drainage catheter into loculated right-sided pleural fluid collection. Approximately 900 cc of dark yellow fluid aspirated, with a sample sent to the lab. Signed, Dulcy Fanny. Earleen Newport, DO [facility] Electronically Signed   By: Corrie Mckusick D.O.   On: 12/28/2015 16:59    Labs:  CBC:  Recent Labs  12/26/15 0925 12/27/15 0226 12/28/15 0641 12/29/15 0338  WBC 24.6* 21.6* 18.5* 16.7*  HGB 9.9* 8.1* 8.0* 8.7*  HCT 32.0* 26.5* 25.2* 29.0*  PLT 442* 370 337 454*    COAGS:  Recent Labs  12/16/15 1505 12/26/15 0925 12/27/15 0226 12/27/15 0723 12/27/15 1635 12/27/15 2351 12/28/15 0641  INR 1.38 2.13*  --  1.98*  --   --  1.50*  APTT 29  --  42*  --  79* 59* 36    BMP:  Recent Labs  12/26/15 0925 12/27/15 0226 12/28/15 0641 12/29/15 0338  NA 134* 135 135 137  K 3.3* 3.5 3.3* 4.6  CL 95* 102 105 103  CO2 28 23 22 24   GLUCOSE 132* 133* 113* 129*  BUN 26* 20 10 7   CALCIUM 8.4* 7.7* 8.2* 9.0  CREATININE 1.64*  1.60* 0.91 0.91  GFRNONAA 34* 36* >60 >60  GFRAA 40* 41* >60 >60    LIVER FUNCTION TESTS:  Recent Labs  02/04/15 0205 10/27/15 0950 12/14/15 1005 12/26/15 0925  BILITOT 0.5 0.5 0.7 1.0  AST 46* 15 17 26   ALT 51* 13* 14 17  ALKPHOS 92 98 101 86  PROT 6.2 7.0 6.6 6.9  ALBUMIN 2.8* 3.6 3.5 3.0*    Assessment and Plan:  Right pleural effusion after  MAZE and atrial mass exicision on 12/16/2015.  Right Chest tube/drain placement by Dr. Earleen Newport 12/28/2015  Continue chest tube until output is minimal.  Electronically Signed: Murrell Redden PA-C 12/29/2015, 1:00 PM   I spent a total of 15 Minutes at the the patient's bedside AND on the patient's hospital floor or unit, greater than 50% of which was counseling/coordinating care for chest tube/drain.

## 2015-12-29 NOTE — Progress Notes (Signed)
Utilization review completed.  

## 2015-12-29 NOTE — Progress Notes (Signed)
CARDIAC REHAB PHASE I   PRE:  Rate/Rhythm: 90 SR  BP:  Sitting: 114/69         SaO2: 97 2L  MODE:  Ambulation: 350 ft   POST:  Rate/Rhythm: 103 ST  BP:  Sitting: 125/58         SaO2: ?87 2L, 97 3L  Pt ambulated 350 ft on 2-3L O2, rolling walker, IV/PCA, chest tube, assist x2 (for safety with equipment), tolerated well. Pt denies any complaints. Pt sats reading 87% on 2L O2, O2 increased to 3L. Difficult to assess accuracy of oxygen saturation due to nail polish on pt's fingernails (PCA reading 74% on 3L, pulse ox reading 87-97% on 2-3L O2). Pt denies any shortness of breath. Pt to recliner after walk, call bell within reach. Will follow.   [id number] Lenna Sciara, RN, BSN 12/29/2015 11:48 AM

## 2015-12-29 NOTE — Progress Notes (Addendum)
      [address]       [place] [address]             [phone]             Subjective: Patient sore on right side  Objective: Vital signs in last 24 hours: Temp:  [98.8 F (37.1 C)-99.1 F (37.3 C)] 99 F (37.2 C) (02/21 0408) Pulse Rate:  [49-101] 89 (02/21 0408) Cardiac Rhythm:  [-] Normal sinus rhythm (02/21 0408) Resp:  [18-38] 18 (02/21 0408) BP: (96-120)/(46-67) 108/50 mmHg (02/21 0408) SpO2:  [93 %-98 %] 94 % (02/21 0408) Weight:  [194 lb 1.6 oz (88.043 kg)] 194 lb 1.6 oz (88.043 kg) (02/21 0646)   Current Weight  12/29/15 194 lb 1.6 oz (88.043 kg)      Intake/Output from previous day: 02/20 0701 - 02/21 0700 In: 350.8 [I.V.:150.8; IV Piggyback:200] Out: 2380 [Urine:2350; Drains:30]   Physical Exam:  Cardiovascular: RRR, no murmurs, gallops, or rubs. Pulmonary: Diminished at bases Abdomen: Soft, non tender, bowel sounds present. Wounds: Clean and dry.  No erythema or signs of infection.  Lab Results: CBC: Recent Labs  12/28/15 0641 12/29/15 0338  WBC 18.5* 16.7*  HGB 8.0* 8.7*  HCT 25.2* 29.0*  PLT 337 454*   BMET:  Recent Labs  12/28/15 0641 12/29/15 0338  NA 135 137  K 3.3* 4.6  CL 105 103  CO2 22 24  GLUCOSE 113* 129*  BUN 10 7  CREATININE 0.91 0.91  CALCIUM 8.2* 9.0    PT/INR:  Lab Results  Component Value Date   INR 1.50* 12/28/2015   INR 1.98* 12/27/2015   INR 2.13* 12/26/2015   ABG:  INR: Will add last result for INR, ABG once components are confirmed Will add last 4 CBG results once components are confirmed  Assessment/Plan:  1. CV - SR in the. On Amiodarone 200 mg daily. 2.  Pulmonary - S/p right thoracentesis with 900 cc of amber fluid removed. CXR this am shows LLL atelectasis and effusion, small right pleural effusion, and vascular congestion. Pigtail with yellow pleural fluid drainage. 3. Anemia-H and H increased to 8.7 and 29 4. ID-WBC decreasing and with low grade fever (99).On  Maxipime and Vancomycin.  ZIMMERMAN,DONIELLE MPA-C 12/29/2015,7:49 AM  I have seen and examined the patient and agree with the assessment and plan as outlined.  Low volume clear serous output from chest tube overnight.  Pleural fluid Gram stain w/ no organisms, culture pending.  Blood cultures remain no growth so far.  Urine culture w/ 40,000 col/mL Enterococcus.  Swab wound culture from chest tube site w/ "few Staphylococcus aureus."  Will defer management of chest tube to IR team but I would expect that it could be removed in 1-2 days if drainage is minimal.  Will adjust antibiotics once culture results are final.  Rexene Alberts, MD 12/29/2015 8:21 AM

## 2015-12-29 NOTE — Progress Notes (Signed)
Pharmacy Antibiotic Note  Miranda Taylor is a 55 y.o. female admitted on 12/26/2015 with pneumonia, enterococcal UTI (pan-S).  Pharmacy has been consulted for vancomycin and cefepime dosing - Day #4. Afeb, wbc 16.7 (down), PCT 2.7 > 0.88. SCr 1.6>>0.91, CrCl~80, good UOP. Per MD note, to de-escalate abx when cultures final.  Plan: Vanc 750mg  IV q12h Change cefepime to 2 gm IV q12h for renal function F/u cx, LOT, narrowing VT prn  Height: 5\' 7"  (170.2 cm) Weight: 194 lb 1.6 oz (88.043 kg) IBW/kg (Calculated) : 61.6  Temp (24hrs), Avg:99 F (37.2 C), Min:98.8 F (37.1 C), Max:99.1 F (37.3 C)   Recent Labs Lab 12/26/15 0925 12/26/15 1122 12/26/15 1436 12/27/15 0226 12/28/15 0641 12/29/15 0338  WBC 24.6*  --   --  21.6* 18.5* 16.7*  CREATININE 1.64*  --   --  1.60* 0.91 0.91  LATICACIDVEN  --  1.30 2.91*  --   --   --     Estimated Creatinine Clearance: 80.6 mL/min (by C-G formula based on Cr of 0.91).    No Known Allergies  Antimicrobials this admission: Cefepime 2/18 >>  Vancomycin 2/18 >>   Dose adjustments this admission: n/a  Microbiology results: 2/18 BCx: ngtd 2/18 UCx: enterococcus (pan-S) 2/19 wound cx: few staph aur (S-pending) 2/18 MRSA PCR neg  Elicia Lamp, PharmD, BCPS Clinical Pharmacist Pager [phone] 12/29/2015 10:48 AM

## 2015-12-30 LAB — WOUND CULTURE
Gram Stain: NONE SEEN
Special Requests: NORMAL

## 2015-12-30 LAB — CBC
HCT: 26.7 % — ABNORMAL LOW (ref 36.0–46.0)
Hemoglobin: 8.1 g/dL — ABNORMAL LOW (ref 12.0–15.0)
MCH: 26.7 pg (ref 26.0–34.0)
MCHC: 30.3 g/dL (ref 30.0–36.0)
MCV: 88.1 fL (ref 78.0–100.0)
PLATELETS: 447 10*3/uL — AB (ref 150–400)
RBC: 3.03 MIL/uL — AB (ref 3.87–5.11)
RDW: 14.9 % (ref 11.5–15.5)
WBC: 11.3 10*3/uL — ABNORMAL HIGH (ref 4.0–10.5)

## 2015-12-30 MED ORDER — POLYETHYLENE GLYCOL 3350 17 G PO PACK
17.0000 g | PACK | Freq: Once | ORAL | Status: AC
  Administered 2015-12-30: 17 g via ORAL
  Filled 2015-12-30: qty 1

## 2015-12-30 NOTE — Progress Notes (Addendum)
      [address]       [place] [address]             [phone]             Subjective: Patient with constipation-she does not want lactulose  Objective: Vital signs in last 24 hours: Temp:  [98.2 F (36.8 C)-100.2 F (37.9 C)] 98.2 F (36.8 C) (02/22 0523) Pulse Rate:  [86-93] 86 (02/22 0523) Cardiac Rhythm:  [-] Normal sinus rhythm (02/21 2010) Resp:  [15-25] 20 (02/22 0523) BP: (104-109)/(49-60) 109/49 mmHg (02/22 0523) SpO2:  [92 %-100 %] 92 % (02/22 0523) FiO2 (%):  [3 %] 3 % (02/21 1349) Weight:  [197 lb 8 oz (89.585 kg)] 197 lb 8 oz (89.585 kg) (02/22 0420)   Current Weight  12/30/15 197 lb 8 oz (89.585 kg)      Intake/Output from previous day: 02/21 0701 - 02/22 0700 In: 780 [P.O.:720] Out: 2000 [Urine:2000]   Physical Exam:  Cardiovascular: RRR, no murmur, gallops, or rubs. Pulmonary: Diminished at bases Abdomen: Soft, non tender, bowel sounds present. Wounds: Clean and dry.  No erythema or signs of infection.  Lab Results: CBC:  Recent Labs  12/29/15 0338 12/30/15 0400  WBC 16.7* 11.3*  HGB 8.7* 8.1*  HCT 29.0* 26.7*  PLT 454* 447*   BMET:   Recent Labs  12/28/15 0641 12/29/15 0338  NA 135 137  K 3.3* 4.6  CL 105 103  CO2 22 24  GLUCOSE 113* 129*  BUN 10 7  CREATININE 0.91 0.91  CALCIUM 8.2* 9.0    PT/INR:  Lab Results  Component Value Date   INR 1.50* 12/28/2015   INR 1.98* 12/27/2015   INR 2.13* 12/26/2015   ABG:  INR: Will add last result for INR, ABG once components are confirmed Will add last 4 CBG results once components are confirmed  Assessment/Plan:  1. CV - SR in the. On Amiodarone 200 mg daily. 2.  Pulmonary - S/p right thoracentesis with 900 cc of amber fluid removed. Pigtail with yellow pleural fluid drainage and appears to have had less than 50 cc last 24 hours. Hope to remove soon. 3. Anemia-H and H increased to 8.1 and 26.7 4. ID-WBC decreasing (11,300 this am) and with  fever  (100.2) last evening.On Maxipime and Vancomycin. Right pleural fluid culture and gram stain shows no organisms, no growth. Preliminary wound culture shows few Staph Aureus,  urine culture showed 40,000 Enterococcus, and Strep pneumo urine antigen negative. 5. LOC constipation-given MOM last evening without relief thus far  ZIMMERMAN,DONIELLE MPA-C 12/30/2015,7:39 AM   I have seen and examined the patient and agree with the assessment and plan as outlined.  No new culture data although both the Enterococcus in urine and Staph aureus from chest tube wound swab were pan-sensitive.  Pleural fluid culture remains no growth.  Trivial output from chest tube - I favor removing it.  Will d/c PCA once tube out.  Continue IV Vancomycin and Maxipime today.  Possible d/c home tomorrow on oral Ciprofloxacin if pleural fluid culture remains no growth.  Rexene Alberts, MD 12/30/2015 8:37 AM

## 2015-12-30 NOTE — Progress Notes (Signed)
CARDIAC REHAB PHASE I   Second attempt to ambulate with pt today. First attempt, pt declined, stated she had just returned to bed. Second attempt, pt now asleep in bed. Will follow up tomorrow.   Lenna Sciara, RN, BSN 12/30/2015 2:20 PM

## 2015-12-30 NOTE — Progress Notes (Signed)
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  Medications: Prior to Admission medications   Medication Sig Start Date End Date Taking? Authorizing Provider  amiodarone (PACERONE) 200 MG tablet Take 1 tablet (200 mg total) by mouth daily. 12/22/15  Yes Donielle Liston Alba, PA-C  apixaban (ELIQUIS) 5 MG TABS tablet Take 1 tablet (5 mg total) by mouth 2 (two) times daily. 10/19/15  Yes Rhonda G Barrett, PA-C  cycloSPORINE (RESTASIS) 0.05 % ophthalmic emulsion Place 1 drop into both eyes 2 (two) times daily as needed (for dry eyes).    Yes Historical Provider, MD  desvenlafaxine (PRISTIQ) 50 MG 24 hr tablet Take 100 mg by mouth daily before breakfast.    Yes Historical Provider, MD  furosemide (LASIX) 40 MG tablet For 5 days then stop. 12/22/15  Yes Donielle Liston Alba, PA-C  LORazepam (ATIVAN) 0.5 MG tablet Take 0.5 mg by mouth at bedtime.    Yes Historical Provider, MD  metoprolol tartrate (LOPRESSOR) 25 MG tablet Take 0.5 tablets (12.5 mg total) by mouth 2 (two) times daily. 12/22/15  Yes Donielle Liston Alba, PA-C  Multiple Vitamin (MULITIVITAMIN WITH MINERALS) TABS Take 1 tablet by mouth daily.   Yes Historical Provider, MD  NEXIUM 40 MG capsule Take 40 mg by mouth daily as needed (for heartburn).  06/25/12  Yes Historical Provider, MD  ondansetron (ZOFRAN ODT) 4 MG disintegrating tablet Take 1 tablet (4 mg total) by mouth every 8 (eight) hours as needed for nausea or vomiting. 12/22/15  Yes Donielle Liston Alba, PA-C  oxyCODONE (OXY IR/ROXICODONE) 5 MG immediate release tablet Take 1-2 tablets (5-10 mg total) by mouth every 4 (four) hours as needed for severe pain. 12/22/15  Yes Donielle Liston Alba, PA-C    polyethylene glycol powder (GLYCOLAX/MIRALAX) powder Take 17 g by mouth every other day. 10/29/15  Yes Historical Provider, MD  potassium chloride (K-DUR) 10 MEQ tablet For 5 days then stop. 12/22/15  Yes Donielle Liston Alba, PA-C  rizatriptan (MAXALT-MLT) 10 MG disintegrating tablet Take 10 mg by mouth daily as needed for migraine.  02/26/13  Yes Historical Provider, MD  triamcinolone cream (KENALOG) 0.1 % Apply 1 application topically daily as needed (for rash). Apply after bathing 10/22/15  Yes Historical Provider, MD  zolpidem (AMBIEN) 5 MG tablet Take 5 mg by mouth at bedtime. 11/17/15  Yes Historical Provider, MD     Vital Signs: BP 109/49 mmHg  Pulse 86  Temp(Src) 98.2 F (36.8 C) (Oral)  Resp 14  Ht 5\' 7"  (1.702 m)  Wt 197 lb 8 oz (89.585 kg)  BMI 30.93 kg/m2  SpO2 94%  LMP 12/25/2012  Physical Exam  Cardiovascular: Normal rate and regular rhythm.   Pulmonary/Chest: Effort normal.  Musculoskeletal: Normal range of motion.  Neurological: She is alert.  Skin: Skin is warm.  Site of Rt chest tube drain clean and dry NT no bleeding 1150 cc in pleur vac---serous/yellow fluid No air leak  Cx NG so far  Nursing note and vitals reviewed.  Tmax 100.2; afeb now  CXR 2/21: IMPRESSION: Pigtail catheter right posterior pleural space in good position. Decreased right pleural effusion. Small hydro pneumothorax in the right lung base  Left lower lobe atelectasis and effusion  Vascular congestion with bilateral  airspace disease right greater than left, probable heart failure versus pneumonia.  Imaging: Dg Chest 2 View  12/29/2015  CLINICAL DATA:  Pleural effusion with drainage EXAM: CHEST  2 VIEW COMPARISON:  12/27/2015 FINDINGS: Pigtail drain in the right posterior pleural space in good position. Improvement in right posterior pleural effusion. There remains pleural effusion in the right lung base and anteriorly. Hydro pneumothorax noted in the right lung base. Small left  effusion unchanged with left lower lobe atelectasis. Progression of vascular congestion and bilateral airspace disease right greater than left, most consistent with heart failure and edema. IMPRESSION: Pigtail catheter right posterior pleural space in good position. Decreased right pleural effusion. Small hydro pneumothorax in the right lung base Left lower lobe atelectasis and effusion Vascular congestion with bilateral airspace disease right greater than left, probable heart failure versus pneumonia. Electronically Signed   By: Franchot Gallo M.D.   On: 12/29/2015 07:47   Dg Chest 2 View  12/26/2015  CLINICAL DATA:  Initial encounter. 55 y/o female with recent heart surgery (removed of benign tumor) that was discharged Tuesday. Here with c/o SOB, fever, nausea. Right chest pain with redness, probably from chest tubes Hx arrythmia, ex-smoker EXAM: CHEST  2 VIEW COMPARISON:  12/22/2015 FINDINGS: There is increased opacity at the right lung base now obscuring the right hemidiaphragm. Opacity at the left lung base is similar to the prior study consistent with a combination of pleural fluid and atelectasis. There is no convincing pulmonary edema. No pneumothorax. Cardiac silhouette is top-normal in size. Left atrial occlusion ir device is stable. IMPRESSION: 1. New opacity at the right lung base. This is likely combination of a small effusion with either atelectasis or pneumonia or a combination. 2. No other change from the prior study. Persistent small left pleural effusion with associated atelectasis. No pulmonary edema. Electronically Signed   By: Lajean Manes M.D.   On: 12/26/2015 10:12   Ct Chest Wo Contrast  12/26/2015  CLINICAL DATA:  Recent heart surgery 12/16/15 EXAM: CT CHEST WITHOUT CONTRAST TECHNIQUE: Multidetector CT imaging of the chest was performed following the standard protocol without IV contrast. COMPARISON:  Current chest radiograph.  Chest CTA, 11/11/2015. FINDINGS: Neck base and axilla:  Small thyroid nodules several calcified. No neck base or axillary masses or adenopathy. Mediastinum and hila: Heart normal in size. Left atrial occlusion device is well positioned. Mild mediastinal adenopathy. There is a 12 mm short axis right peritracheal node and a 12 mm precarinal node. No hilar masses or discrete enlarged lymph nodes. Lungs and pleura: There are areas of loculated pleural fluid on the right. Loculated pleural fluid is seen anteriorly and along the oblique fissure. There is loculated posterior medial pleural fluid which is contiguous with more free fluid at the posterior lung base. Opacity in the right lower lobe is most likely all atelectasis. There is milder atelectasis in the right middle lobe and dependent right upper lobe. There is a small free flowing left pleural effusion. Left lower lobe opacity is also noted likely atelectasis. There is mild hazy opacity in the left upper lobe mild bilateral interstitial prominence. There is no convincing pneumothorax. There is right anterior chest wall subcutaneous emphysema that extends toward the right axilla. Limited upper abdomen: Upper pole right renal cyst. Otherwise unremarkable. IMPRESSION: 1. New opacity on the current chest radiograph is due to a combination of loculated pleural fluid on the right and right lung, primarily right lower lobe, atelectasis. A component of pneumonia is possible but felt  less likely. 2. Small left pleural effusion. Partial left lower lobe atelectasis. 3. Mild interstitial prominence without overt pulmonary edema. 4. No pneumothorax. Right anterior chest wall subcutaneous emphysema. Electronically Signed   By: Lajean Manes M.D.   On: 12/26/2015 10:51   US Abdomen Complete  12/26/2015  CLINICAL DATA:  Diffuse abdominal discomfort s/p heart surgery to removed a benign mass 1 week ago. Pt states she has been nauseated since surgery and has had no appetite. Pt states most of her pain is centered around the chest tube  site on the right under the right breast. EXAM: ABDOMEN ULTRASOUND COMPLETE COMPARISON:  CT, 11/11/2015 FINDINGS: Gallbladder: No gallstones or wall thickening visualized. No sonographic Murphy sign noted by sonographer. Common bile duct: Diameter: 5.5 mm Liver: Normal in size and echogenicity.  No mass or focal lesion. IVC: No abnormality visualized. Pancreas: Suboptimally visualized.  Portions seen are unremarkable. Spleen: Size and appearance within normal limits. Right Kidney: Length: 14.4 cm. Normal parenchymal echogenicity. Mild cortical thinning. Two cysts, 1 from the upper pole measuring 5.8 cm and 1 in the lower pole measuring 4.9 cm. No hydronephrosis. Left Kidney: Length: 12.0 cm. Echogenicity within normal limits. No mass or hydronephrosis visualized. Abdominal aorta: No aneurysm visualized. Other findings: None. IMPRESSION: 1. No acute findings. No evidence acute cholecystitis or gallstones. No bile duct dilation. 2. Right renal cysts. Electronically Signed   By: Lajean Manes M.D.   On: 12/26/2015 12:56   Dg Chest Port 1 View  12/27/2015  CLINICAL DATA:  Shortness of breath. EXAM: PORTABLE CHEST 1 VIEW COMPARISON:  12/26/2015 and chest CT dated 12/26/2015. FINDINGS: The cardiac silhouette remains borderline enlarged. Increased density at both lung bases. Stable diffuse prominence of the interstitial markings. Unremarkable bones. IMPRESSION: 1. Increased bibasilar atelectasis or pneumonia. 2. Mildly increased bibasilar pleural fluid. 3. Stable interstitial lung disease. Electronically Signed   By: Claudie Revering M.D.   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 She has been referred for drain placement. EXAM: CT IMAGE GUIDED DRAINAGE BY PERCUTANEOUS CATHETER MEDICATIONS: The patient is currently admitted to the hospital and receiving  intravenous antibiotics. The antibiotics were administered within an appropriate time frame prior to the initiation of the procedure. ANESTHESIA/SEDATION: Fentanyl 1.0 mcg IV; Versed 50 mg IV Moderate Sedation Time:  15 The patient was continuously monitored during the procedure by the interventional radiology nurse under my direct supervision. COMPLICATIONS: None PROCEDURE: Informed written consent was obtained from the patient after a thorough discussion of the procedural risks, benefits and alternatives. All questions were addressed. Maximal Sterile Barrier Technique was utilized including caps, mask, sterile gowns, sterile gloves, sterile drape, hand hygiene and skin antiseptic. A timeout was performed prior to the initiation of the procedure. Patient is positioned left decubitus position on the CT gantry table. Scout CT of the chest was performed for planning purposes. The skin and subcutaneous tissues of the right posterior thorax were generously infiltrated with 1% lidocaine for local anesthesia. Using CT guidance and modified Seldinger technique, a 10 French drainage catheter was placed into the fluid collection of the right chest. 60 cc aspiration was acquired for lab analysis. Catheter was attached to waterseal/suction, with approximately 900 cc aspirated. Retention suture was placed and a sterile bandage was placed. Patient tolerated the procedure well and remained hemodynamically stable throughout. No complications were encountered and  no significant blood loss encountered. IMPRESSION: Status post CT-guided 10 French drainage catheter into loculated right-sided pleural fluid collection. Approximately 900 cc of dark yellow fluid aspirated, with a sample sent to the lab. Signed, Dulcy Fanny. Earleen Newport, DO [facility] Electronically Signed   By: Corrie Mckusick D.O.   On: 12/28/2015 16:59    Labs:  CBC:  Recent Labs  12/27/15 0226 12/28/15 0641  12/29/15 0338 12/30/15 0400  WBC 21.6* 18.5* 16.7* 11.3*  HGB 8.1* 8.0* 8.7* 8.1*  HCT 26.5* 25.2* 29.0* 26.7*  PLT 370 337 454* 447*    COAGS:  Recent Labs  12/16/15 1505 12/26/15 0925 12/27/15 0226 12/27/15 0723 12/27/15 1635 12/27/15 2351 12/28/15 0641  INR 1.38 2.13*  --  1.98*  --   --  1.50*  APTT 29  --  42*  --  79* 59* 36    BMP:  Recent Labs  12/26/15 0925 12/27/15 0226 12/28/15 0641 12/29/15 0338  NA 134* 135 135 137  K 3.3* 3.5 3.3* 4.6  CL 95* 102 105 103  CO2 28 23 22 24   GLUCOSE 132* 133* 113* 129*  BUN 26* 20 10 7   CALCIUM 8.4* 7.7* 8.2* 9.0  CREATININE 1.64* 1.60* 0.91 0.91  GFRNONAA 34* 36* >60 >60  GFRAA 40* 41* >60 >60    LIVER FUNCTION TESTS:  Recent Labs  02/04/15 0205 10/27/15 0950 12/14/15 1005 12/26/15 0925  BILITOT 0.5 0.5 0.7 1.0  AST 46* 15 17 26   ALT 51* 13* 14 17  ALKPHOS 92 98 101 86  PROT 6.2 7.0 6.6 6.9  ALBUMIN 2.8* 3.6 3.5 3.0*    Assessment and Plan:  Rt chest tube drain intact 1150 cc in pleurvac Minimal output per Dr Roxy Manns note will follow Plan per [facility]  Electronically Signed: , A 12/30/2015, 9:47 AM   I spent a total of 15 Minutes at the the patient's bedside AND on the patient's hospital floor or unit, greater than 50% of which was counseling/coordinating care for Rt chest tube drain

## 2015-12-30 NOTE — Discharge Summary (Addendum)
Physician Discharge Summary       [address]       [place] [address]             [phone]    Patient ID: Miranda Taylor MRN: [id number] DOB/AGE: 07-12-1961 55 y.o.  Admit date: 12/26/2015 Discharge date: 01/02/2016  Admission Diagnoses: 1. Sepsis ([place]) 2. Right pleural effusion 3. Right empyema  Active Diagnoses:  1. S/P Minimally-invasive resection of LA papillary fibroelastoma and maze operation for atrial fibrillation 2. Paroxysmal atrial fibrillation (HCC) 3. Chronic diastolic heart failure ([place]) 4. Major depressive disorder, recurrent episode, moderate (HCC) 5. Chronic lower back pain 6. Fibromyalgia 7. GERD (gastroesophageal reflux disease) 8. DDD (degenerative disc disease), cervical 9. UTI (lower urinary tract infection) 10. Migraines  Consults: pulmonary/intensive care and and interventional radiology  Procedure (s):  Placement of a right pleural drain, for suspected empyema. 41F pigtail via CT guidance by Dr. Earleen Newport on 12/28/2015.  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   Originally, the patient was found to have what was thought to be a left atrial thrombus in July 2016. After a series of planned TEE's, it was ultimately determined that it was a mass in the left atrial appendage. She was referred to [facility] for surgical evaluation. On 2/8, she underwent minimally invasive resection of left atrial mass and MAZE procedure. Post operative course was uncomplicated. Pathology was consistent with fibroelastoma. She  had residual small bilateral pleural effusions. Chest tubes were removed without difficulty. She was discharged on 12/22/2015.   On 2/18, the patient reported she began having increased right sided chest pain from the chest tube site that was progressive in nature. She also had mild shortness of breath. Initial ER evaluation was notable for a temperature of 98.1, HR 73, BP 101/55 and 95% on room air. She developed hypotension in the ER with systolic pressures falling to the 60's. Initial labs showed Na 134, K 3.3, Cl 95, BUN 26, Sr Cr 1.64 (up from 1.05), glucose 132, WBC 24.6 (up from 8.5 at discharge), Hgb 9.9, and platelets 442. Initial CXR was concerning for increased opacity in the right lung, small left effusion unchanged. This was further evaluated with a CT of the Chest w/o contrast that demonstrated loculated pleural fluid on right, primarily RLL, atelectasis, small left pleural effusion, L lower atelectasis, no PTX, R anterior chest wall subcutaneous emphysema.   PCCM was consulted for admission and TCTS as well.    Brief Hospital Course:  Echo was done on 12/27/2015. It showed LVEF 55-60%,  No AS or AI, no MS or MR, no PS or PR, and mild TR, and no pericardial effusion. Eliquis was on hold secondary to possible IR procedure. She was started on both IV Vancomycin and Maxipime. The patient had a fair amount of right sided chest/flank pain. She was put on a PCA on 02/19. She had a fever to 102 and WBC slightly down to 21,600. Blood culture showed no growth thus far, urine cultures showed 40,000 Enterococcus, and wound culture showed few Staph Aureus.  Interventional radiology was consulted and a 10 [place]  pigtail catheter was placed in the right pleural space (effusion/empyema on 12/28/2015. Initially, there was 900 cc of amber fluid removed. Right pleural fluid culture showed no growth thus far. Drainage over the next 24 hours was scant. Pigtail catheter was removed on 12/31/2015. She did  desat with ambulation on 02/23. She was volume overloaded and given IV Lasix with good response. WBC now down to 9,200. Per Dr. Roxy Manns, patient will be discharged on Levaquin 750 mg bid for 2 weeks. Eliquis and low dose Lopressor were restarted. She is felt surgically stable for discharge today.   Latest Vital Signs: Blood pressure 100/55, pulse 72, temperature 98.2 F (36.8 C), temperature source Oral, resp. rate 19, height 5\' 7"  (1.702 m), weight 184 lb 14.4 oz (83.87 kg), last menstrual period 12/25/2012, SpO2 97 %.  Physical Exam: Cardiovascular: RRR, no murmur, gallops, or rubs. Pulmonary: Diminished at bases Abdomen: Soft, non tender, bowel sounds present. Wounds: Clean and dry. No erythema or signs of infection.  Discharge Condition:Stable and discharged to home  Recent laboratory studies:  Lab Results  Component Value Date   WBC 9.2 01/01/2016   HGB 8.0* 01/01/2016   HCT 25.3* 01/01/2016   MCV 88.5 01/01/2016   PLT 431* 01/01/2016   Lab Results  Component Value Date   NA 138 01/01/2016   K 4.2 01/01/2016   CL 96* 01/01/2016   CO2 32 01/01/2016   CREATININE 0.82 01/01/2016   GLUCOSE 105* 01/01/2016    Diagnostic Studies:  EXAM: CHEST 2 VIEW  COMPARISON: Portable chest x-ray of December 31, 2015  FINDINGS: The lungs are reasonably well inflated. There are bilateral pleural effusions likely with some loculation on the right. Confluent alveolar opacity in the right upper and right infrahilar regions is present. There is left lower lobe atelectasis or pneumonia. The cardiac silhouette is top-normal in size. A left atrial appendage clip is present. The pulmonary vascularity is not engorged. The mediastinum is normal in width. The bony thorax is unremarkable.  IMPRESSION: 1. Interstitial and alveolar opacities bilaterally consistent with pneumonia. Small bilateral pleural effusions. No definite pneumothorax evident today. 2. Mild cardiomegaly without significant  pulmonary vascular congestion.   Electronically Signed  By: David Martinique M.D.  On: 01/01/2016 08:09  Ct Chest Wo Contrast  12/26/2015  CLINICAL DATA:  Recent heart surgery 12/16/15 EXAM: CT CHEST WITHOUT CONTRAST TECHNIQUE: Multidetector CT imaging of the chest was performed following the standard protocol without IV contrast. COMPARISON:  Current chest radiograph.  Chest CTA, 11/11/2015. FINDINGS: Neck base and axilla: Small thyroid nodules several calcified. No neck base or axillary masses or adenopathy. Mediastinum and hila: Heart normal in size. Left atrial occlusion device is well positioned. Mild mediastinal adenopathy. There is a 12 mm short axis right peritracheal node and a 12 mm precarinal node. No hilar masses or discrete enlarged lymph nodes. Lungs and pleura: There are areas of loculated pleural fluid on the right. Loculated pleural fluid is seen anteriorly and along the oblique fissure. There is loculated posterior medial pleural fluid which is contiguous with more free fluid at the posterior lung base. Opacity in the right lower lobe is most likely all atelectasis. There is milder atelectasis in the right middle lobe and dependent right upper lobe. There is a small free flowing left pleural effusion. Left lower lobe opacity is also noted likely atelectasis. There is mild hazy opacity in the left upper lobe mild bilateral interstitial prominence. There is no convincing pneumothorax. There is right anterior chest wall subcutaneous emphysema  that extends toward the right axilla. Limited upper abdomen: Upper pole right renal cyst. Otherwise unremarkable. IMPRESSION: 1. New opacity on the current chest radiograph is due to a combination of loculated pleural fluid on the right and right lung, primarily right lower lobe, atelectasis. A component of pneumonia is possible but felt less likely. 2. Small left pleural effusion. Partial left lower lobe atelectasis. 3. Mild interstitial prominence without  overt pulmonary edema. 4. No pneumothorax. Right anterior chest wall subcutaneous emphysema. Electronically Signed   By: Lajean Manes M.D.   On: 12/26/2015 10:51   US Abdomen Complete  12/26/2015  CLINICAL DATA:  Diffuse abdominal discomfort s/p heart surgery to removed a benign mass 1 week ago. Pt states she has been nauseated since surgery and has had no appetite. Pt states most of her pain is centered around the chest tube site on the right under the right breast. EXAM: ABDOMEN ULTRASOUND COMPLETE COMPARISON:  CT, 11/11/2015 FINDINGS: Gallbladder: No gallstones or wall thickening visualized. No sonographic Murphy sign noted by sonographer. Common bile duct: Diameter: 5.5 mm Liver: Normal in size and echogenicity.  No mass or focal lesion. IVC: No abnormality visualized. Pancreas: Suboptimally visualized.  Portions seen are unremarkable. Spleen: Size and appearance within normal limits. Right Kidney: Length: 14.4 cm. Normal parenchymal echogenicity. Mild cortical thinning. Two cysts, 1 from the upper pole measuring 5.8 cm and 1 in the lower pole measuring 4.9 cm. No hydronephrosis. Left Kidney: Length: 12.0 cm. Echogenicity within normal limits. No mass or hydronephrosis visualized. Abdominal aorta: No aneurysm visualized. Other findings: None. IMPRESSION: 1. No acute findings. No evidence acute cholecystitis or gallstones. No bile duct dilation. 2. Right renal cysts. Electronically Signed   By: Lajean Manes M.D.   On: 12/26/2015 12:56   Ct Image Guided Drainage By Percutaneous Catheter  12/28/2015  INDICATION: 55 year old female with a history of loculated fluid collection of the right pleural space, status post CABG. She has been referred for drain placement. EXAM: CT IMAGE GUIDED DRAINAGE BY PERCUTANEOUS CATHETER MEDICATIONS: The patient is currently admitted to the hospital and receiving intravenous antibiotics. The antibiotics were administered within an appropriate time frame prior to the initiation  of the procedure. ANESTHESIA/SEDATION: Fentanyl 1.0 mcg IV; Versed 50 mg IV Moderate Sedation Time:  15 The patient was continuously monitored during the procedure by the interventional radiology nurse under my direct supervision. COMPLICATIONS: None PROCEDURE: Informed written consent was obtained from the patient after a thorough discussion of the procedural risks, benefits and alternatives. All questions were addressed. Maximal Sterile Barrier Technique was utilized including caps, mask, sterile gowns, sterile gloves, sterile drape, hand hygiene and skin antiseptic. A timeout was performed prior to the initiation of the procedure. Patient is positioned left decubitus position on the CT gantry table. Scout CT of the chest was performed for planning purposes. The skin and subcutaneous tissues of the right posterior thorax were generously infiltrated with 1% lidocaine for local anesthesia. Using CT guidance and modified Seldinger technique, a 10 French drainage catheter was placed into the fluid collection of the right chest. 60 cc aspiration was acquired for lab analysis. Catheter was attached to waterseal/suction, with approximately 900 cc aspirated. Retention suture was placed and a sterile bandage was placed. Patient tolerated the procedure well and remained hemodynamically stable throughout. No complications were encountered and no significant blood loss encountered. IMPRESSION: Status post CT-guided 10 French drainage catheter into loculated right-sided pleural fluid collection. Approximately 900 cc of dark yellow fluid aspirated, with a sample  sent to the lab. Signed, Dulcy Fanny. Earleen Newport, DO [facility] Electronically Signed   By: Corrie Mckusick D.O.   On: 12/28/2015 16:59    Discharge Medications:   Medication List    STOP taking these medications        amiodarone 200 MG tablet  Commonly known as:  PACERONE     polyethylene glycol powder  powder  Commonly known as:  GLYCOLAX/MIRALAX      TAKE these medications        apixaban 5 MG Tabs tablet  Commonly known as:  ELIQUIS  Take 1 tablet (5 mg total) by mouth 2 (two) times daily.     cycloSPORINE 0.05 % ophthalmic emulsion  Commonly known as:  RESTASIS  Place 1 drop into both eyes 2 (two) times daily as needed (for dry eyes).     desvenlafaxine 50 MG 24 hr tablet  Commonly known as:  PRISTIQ  Take 100 mg by mouth daily before breakfast.     furosemide 40 MG tablet  Commonly known as:  LASIX  For 5 days then stop.     guaiFENesin 600 MG 12 hr tablet  Commonly known as:  MUCINEX  Take 2 tablets (1,200 mg total) by mouth 2 (two) times daily.     levofloxacin 750 MG tablet  Commonly known as:  LEVAQUIN  Take 1 tablet (750 mg total) by mouth daily.     LORazepam 0.5 MG tablet  Commonly known as:  ATIVAN  Take 0.5 mg by mouth at bedtime.     metoprolol tartrate 25 MG tablet  Commonly known as:  LOPRESSOR  Take 0.5 tablets (12.5 mg total) by mouth 2 (two) times daily.     multivitamin with minerals Tabs tablet  Take 1 tablet by mouth daily.     NEXIUM 40 MG capsule  Generic drug:  esomeprazole  Take 40 mg by mouth daily as needed (for heartburn).     ondansetron 4 MG disintegrating tablet  Commonly known as:  ZOFRAN ODT  Take 1 tablet (4 mg total) by mouth every 8 (eight) hours as needed for nausea or vomiting.     oxyCODONE 5 MG immediate release tablet  Commonly known as:  Oxy IR/ROXICODONE  Take 1-2 tablets (5-10 mg total) by mouth every 4 (four) hours as needed for severe pain.     potassium chloride 10 MEQ tablet  Commonly known as:  K-DUR  For 5 days then stop.     rizatriptan 10 MG disintegrating tablet  Commonly known as:  MAXALT-MLT  Take 10 mg by mouth daily as needed for migraine.     triamcinolone cream 0.1 %  Commonly known as:  KENALOG  Apply 1 application topically daily as needed (for rash). Apply after bathing     zolpidem 5 MG  tablet  Commonly known as:  AMBIEN  Take 5 mg by mouth at bedtime.       The patient has been discharged on:   1.Beta Blocker:  Yes [ x  ]                              No   [   ]                              If No, reason:  2.Ace Inhibitor/ARB: Yes [   ]  No  [  x  ]                                     If No, reason:Labile BP  3.Statin:   Yes [   ]                  No  [ x  ]                  If No, reason:No CAD  4.[place]:  Yes  [   ]                  No   [  x ]                  If No, reason:On Eliquis, no CAD  Follow Up Appointments: Follow-up Information    Follow up with HAGER, BRYAN, PA-C On 01/06/2016.   Specialties:  Physician Assistant, Radiology, Interventional Cardiology   Why:  Appointment time is at 3:30 pm   Contact information:   [address] [place] [address] [phone]       Follow up with Rexene Alberts, MD.   Specialty:  Cardiothoracic Surgery   Why:  PA/LAT CXR to be taken (at [facility] which is in the same building as Dr. Guy Sandifer office) on 01/25/2016 at 2:45 pm;Appointment is at 3:30 pm   Contact information:   [place] [address] [phone]       Signed: Macy Mis 01/02/2016, 1:18 PM

## 2015-12-31 ENCOUNTER — Inpatient Hospital Stay (HOSPITAL_COMMUNITY): Payer: BLUE CROSS/BLUE SHIELD

## 2015-12-31 LAB — CULTURE, BLOOD (ROUTINE X 2)
Culture: NO GROWTH
Culture: NO GROWTH

## 2015-12-31 LAB — BASIC METABOLIC PANEL
ANION GAP: 9 (ref 5–15)
BUN: <5 mg/dL — ABNORMAL LOW (ref 6–20)
CALCIUM: 8.7 mg/dL — AB (ref 8.9–10.3)
CHLORIDE: 99 mmol/L — AB (ref 101–111)
CO2: 30 mmol/L (ref 22–32)
Creatinine, Ser: 0.68 mg/dL (ref 0.44–1.00)
GFR calc Af Amer: >60 mL/min (ref >60)
GFR calc non Af Amer: >60 mL/min (ref >60)
GLUCOSE: 109 mg/dL — AB (ref 65–99)
Potassium: 4.2 mmol/L (ref 3.5–5.1)
Sodium: 138 mmol/L (ref 135–145)

## 2015-12-31 MED ORDER — FUROSEMIDE 10 MG/ML IJ SOLN
40.0000 mg | Freq: Once | INTRAMUSCULAR | Status: AC
  Administered 2015-12-31: 40 mg via INTRAVENOUS
  Filled 2015-12-31: qty 4

## 2015-12-31 MED ORDER — OXYCODONE HCL 5 MG PO TABS: 5.0000 mg | ORAL_TABLET | ORAL | Status: DC | PRN

## 2015-12-31 MED ORDER — POLYETHYLENE GLYCOL 3350 17 G PO PACK
17.0000 g | PACK | Freq: Once | ORAL | Status: AC
  Administered 2015-12-31: 17 g via ORAL
  Filled 2015-12-31: qty 1

## 2015-12-31 MED ORDER — FUROSEMIDE 10 MG/ML IJ SOLN: 20.0000 mg | Freq: Once | INTRAMUSCULAR | Status: DC

## 2015-12-31 MED ORDER — CIPROFLOXACIN HCL 500 MG PO TABS
500.0000 mg | ORAL_TABLET | Freq: Two times a day (BID) | ORAL | Status: DC
  Administered 2015-12-31 – 2016-01-02 (×4): 500 mg via ORAL
  Filled 2015-12-31 (×4): qty 1

## 2015-12-31 MED ORDER — CIPROFLOXACIN HCL 500 MG PO TABS: 500.0000 mg | ORAL_TABLET | Freq: Two times a day (BID) | ORAL | Status: DC

## 2015-12-31 NOTE — Progress Notes (Signed)
CARDIAC REHAB PHASE I   PRE:  Rate/Rhythm: 90 SR  BP:  Supine: 120/64  Sitting:   Standing:    SaO2: 92% 2L,  82% RA in bed,  86% RA up in room  MODE:  Ambulation: 350 ft   POST:  Rate/Rhythm: 102  BP:  Supine:   Sitting: 139/70  Standing:    SaO2: 87% 2L had to increase to 3L for 94% walking,  93% 2L in bed [id number] Wrote note to qualify for home oxygen if pt is for discharge. Pt walked 350 ft on 3L with rolling walker and asst x 1. When asked if she felt SOB walking, she stated yes. Back to bed after walk. To 2L for resting. RN aware.   Graylon Good, RN BSN  12/31/2015 2:38 PM

## 2015-12-31 NOTE — Progress Notes (Addendum)
      [address]       [place] [address]             [phone]             Subjective: Patient still with constipation, but does not want lactulose  Objective: Vital signs in last 24 hours: Temp:  [98.3 F (36.8 C)-99.1 F (37.3 C)] 98.3 F (36.8 C) (02/23 0601) Pulse Rate:  [83-91] 83 (02/23 0601) Cardiac Rhythm:  [-] Normal sinus rhythm (02/22 2001) Resp:  [13-20] 16 (02/23 0625) BP: (105-115)/(57-58) 105/57 mmHg (02/23 0601) SpO2:  [91 %-99 %] 96 % (02/23 0700)   Current Weight  12/30/15 197 lb 8 oz (89.585 kg)      Intake/Output from previous day: 02/22 0701 - 02/23 0700 In: 770 [P.O.:720; IV Piggyback:50] Out: [id number] [Urine:1350]   Physical Exam:  Cardiovascular: RRR, no murmur, gallops, or rubs. Pulmonary: Diminished at bases Abdomen: Soft, non tender, bowel sounds present. Wounds: Clean and dry.  No erythema or signs of infection.  Lab Results: CBC:  Recent Labs  12/29/15 0338 12/30/15 0400  WBC 16.7* 11.3*  HGB 8.7* 8.1*  HCT 29.0* 26.7*  PLT 454* 447*   BMET:   Recent Labs  12/29/15 0338  NA 137  K 4.6  CL 103  CO2 24  GLUCOSE 129*  BUN 7  CREATININE 0.91  CALCIUM 9.0    PT/INR:  Lab Results  Component Value Date   INR 1.50* 12/28/2015   INR 1.98* 12/27/2015   INR 2.13* 12/26/2015   ABG:  INR: Will add last result for INR, ABG once components are confirmed Will add last 4 CBG results once components are confirmed  Assessment/Plan:  1. CV - SR in the. On Amiodarone 200 mg daily. 2.  Pulmonary - S/p right thoracentesis with 900 cc of amber fluid removed. Pigtail with yellow pleural fluid drainage and appears to have been scant last 2 days. Hope IR removes today. 3. Anemia-H and H increased to 8.1 and 26.7 4. ID-WBC decreased to 11,300 and with low grade fever (99.1).On Maxipime and Vancomycin. Right pleural fluid culture and gram stain shows no organisms, no growth. Preliminary wound culture shows few  Staph Aureus,  urine culture showed 40,000 Enterococcus, and Strep pneumo urine antigen negative. Per Dr. Roxy Manns, will change antibiotic to Cipro at discharge for 2 weeks 5. LOC constipation 6. Per patient request, PCA to remain until pigtail catheter removed  ZIMMERMAN,DONIELLE MPA-C 12/31/2015,7:56 AM    I have seen and examined the patient and agree with the assessment and plan as outlined.  Tube not removed yesterday for unclear reasons.  D/C tube.  D/C home later today if CXR okay after tube out.   Rexene Alberts, MD 12/31/2015 8:54 AM

## 2015-12-31 NOTE — Progress Notes (Signed)
8SATURATION QUALIFICATIONS: (This note is used to comply with regulatory documentation for home oxygen)  Patient Saturations on Room Air at Rest = 82%  Patient Saturations on Room Air while Ambulating = 86%  Patient Saturations on 3 Liters of oxygen while Ambulating = 94%  Please briefly explain why patient needs home oxygen: desat on RA

## 2015-12-31 NOTE — Progress Notes (Signed)
Desat alarm repeatedly sounding due to poor sensor reading. Patient has artifical nails on which is interfering with oxygen sensor. Rechecked patient's oxygen, patient saturation level is 94. Will turn off PCA and continue to monitor patient.

## 2015-12-31 NOTE — Progress Notes (Signed)
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 Dr. Roxy Manns ok with chest drain removal.  Allergies: Review of patient's allergies indicates no known allergies.  Medications:  Current facility-administered medications:  .  0.9 %  sodium chloride infusion, 250 mL, Intravenous, PRN, Donita Brooks, NP .  acetaminophen (TYLENOL) tablet 650 mg, 650 mg, Oral, Q4H PRN, Donita Brooks, NP, 650 mg at 12/27/15 0418 .  amiodarone (PACERONE) tablet 200 mg, 200 mg, Oral, Daily, Donita Brooks, NP, 200 mg at 12/31/15 1116 .  ceFEPIme (MAXIPIME) 2 g in dextrose 5 % 50 mL IVPB, 2 g, Intravenous, Q12H, Romona Curls, RPH, 2 g at 12/31/15 1119 .  cycloSPORINE (RESTASIS) 0.05 % ophthalmic emulsion 1 drop, 1 drop, Both Eyes, BID PRN, Donita Brooks, NP .  diphenhydrAMINE (BENADRYL) injection 12.5 mg, 12.5 mg, Intravenous, Q6H PRN **OR** diphenhydrAMINE (BENADRYL) 12.5 MG/5ML elixir 12.5 mg, 12.5 mg, Oral, Q6H PRN, Ivin Poot, MD .  enoxaparin (LOVENOX) injection 30 mg, 30 mg, Subcutaneous, Q24H, Rexene Alberts, MD, 30 mg at 12/31/15 1114 .  fentaNYL (SUBLIMAZE) injection 50 mcg, 50 mcg, Intravenous, Once, Fredia Sorrow, MD, 50 mcg at 12/26/15 1600 .  magnesium hydroxide (MILK OF MAGNESIA) suspension 30 mL, 30 mL, Oral, Daily PRN, Wayne E Gold, PA-C, 30 mL at 12/31/15 1116 .  morphine (MORPHINE) 2 mg/mL PCA injection, , Intravenous, 6 times per day, Ivin Poot, MD .  multivitamin with minerals tablet 1 tablet, 1 tablet, Oral, Daily, Donita Brooks, NP, 1 tablet at 12/31/15 1116 .  naloxone (NARCAN) injection 0.4 mg, 0.4 mg, Intravenous, PRN **AND** sodium chloride flush (NS) 0.9 % injection 9 mL, 9 mL, Intravenous, PRN, Ivin Poot, MD .  ondansetron [facility]) injection 4 mg, 4 mg, Intravenous, Q6H  PRN, Donita Brooks, NP .  oxyCODONE (Oxy IR/ROXICODONE) immediate release tablet 5 mg, 5 mg, Oral, Q3H PRN, Ivin Poot, MD, 5 mg at 12/31/15 1115 .  pantoprazole (PROTONIX) EC tablet 80 mg, 80 mg, Oral, Q1200, Donita Brooks, NP, 80 mg at 12/31/15 1114 .  vancomycin (VANCOCIN) IVPB 750 mg/150 ml premix, 750 mg, Intravenous, Q12H, Cassie Jodean Lima, RPH, 750 mg at 12/30/15 2226    Vital Signs: BP 105/57 mmHg  Pulse 83  Temp(Src) 98.3 F (36.8 C) (Oral)  Resp 17  Ht 5\' 7"  (1.702 m)  Wt 197 lb 8 oz (89.585 kg)  BMI 30.93 kg/m2  SpO2 94%  LMP 12/25/2012  Physical Exam  Cardiovascular: Normal rate and regular rhythm.   Pulmonary/Chest: Effort normal.  Musculoskeletal: Normal range of motion.  Neurological: She is alert.  Skin: Skin is warm.  Site of Rt chest tube drain clean and dry NT no bleeding Scant output past 24-36hrs   Nursing note and vitals reviewed.    Imaging: Dg Chest 2 View  12/31/2015  CLINICAL DATA:  Patient with history of pleural effusion and drainage. EXAM: CHEST  2 VIEW COMPARISON:  Chest radiograph 12/29/2015 FINDINGS: Right pigtail drainage catheter stable in position. Small right hydropneumothorax, most notable at the lung base. Unchanged heterogeneous opacities throughout the right lung. Moderate left pleural effusion with underlying opacities, unchanged. Thoracic spine degenerative changes. Grossly stable cardiac and mediastinal contours. IMPRESSION: Grossly unchanged placement of right pigtail pleural drainage catheter with small right basilar hydro pneumothorax.  Grossly unchanged diffuse airspace opacities throughout the right lung. Unchanged left pleural effusion and underlying atelectasis. Electronically Signed   By: Lovey Newcomer M.D.   On: 12/31/2015 10:56   Dg Chest 2 View  12/29/2015  CLINICAL DATA:  Pleural effusion with drainage EXAM: CHEST  2 VIEW COMPARISON:  12/27/2015 FINDINGS: Pigtail drain in the right posterior pleural space in good  position. Improvement in right posterior pleural effusion. There remains pleural effusion in the right lung base and anteriorly. Hydro pneumothorax noted in the right lung base. Small left effusion unchanged with left lower lobe atelectasis. Progression of vascular congestion and bilateral airspace disease right greater than left, most consistent with heart failure and edema. IMPRESSION: Pigtail catheter right posterior pleural space in good position. Decreased right pleural effusion. Small hydro pneumothorax in the right lung base Left lower lobe atelectasis and effusion Vascular congestion with bilateral airspace disease right greater than left, probable heart failure versus pneumonia. Electronically Signed   By: Franchot Gallo M.D.   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 She has been referred for drain placement. EXAM: CT IMAGE GUIDED DRAINAGE BY PERCUTANEOUS CATHETER MEDICATIONS: The patient is currently admitted to the hospital and receiving intravenous antibiotics. The antibiotics were administered within an appropriate time frame prior to the initiation of the procedure. ANESTHESIA/SEDATION: Fentanyl 1.0 mcg IV; Versed 50 mg IV Moderate Sedation Time:  15 The patient was continuously monitored during the procedure by the interventional radiology nurse under my direct supervision. COMPLICATIONS: None PROCEDURE: Informed written consent was obtained from the patient after a thorough discussion of the procedural risks, benefits and alternatives. All questions were addressed. Maximal Sterile Barrier Technique was utilized including caps, mask, sterile gowns, sterile gloves, sterile drape, hand hygiene and skin antiseptic. A timeout was performed prior to the initiation of the procedure. Patient is positioned left decubitus position on the CT  gantry table. Scout CT of the chest was performed for planning purposes. The skin and subcutaneous tissues of the right posterior thorax were generously infiltrated with 1% lidocaine for local anesthesia. Using CT guidance and modified Seldinger technique, a 10 French drainage catheter was placed into the fluid collection of the right chest. 60 cc aspiration was acquired for lab analysis. Catheter was attached to waterseal/suction, with approximately 900 cc aspirated. Retention suture was placed and a sterile bandage was placed. Patient tolerated the procedure well and remained hemodynamically stable throughout. No complications were encountered and no significant blood loss encountered. IMPRESSION: Status post CT-guided 10 French drainage catheter into loculated right-sided pleural fluid collection. Approximately 900 cc of dark yellow fluid aspirated, with a sample sent to the lab. Signed, Dulcy Fanny. Earleen Newport, DO [facility]iology Electronically Signed   By: Corrie Mckusick D.O.   On: 12/28/2015 16:59    Labs:  CBC:  Recent Labs  12/27/15 0226 12/28/15 0641 12/29/15 0338 12/30/15 0400  WBC 21.6* 18.5* 16.7* 11.3*  HGB 8.1* 8.0* 8.7* 8.1*  HCT 26.5* 25.2* 29.0* 26.7*  PLT 370 337 454* 447*    COAGS:  Recent Labs  12/16/15 1505 12/26/15 0925 12/27/15 0226 12/27/15 0723 12/27/15 1635 12/27/15 2351 12/28/15 0641  INR 1.38 2.13*  --  1.98*  --   --  1.50*  APTT 29  --  42*  --  79* 59* 36  BMP:  Recent Labs  12/26/15 0925 12/27/15 0226 12/28/15 0641 12/29/15 0338  NA 134* 135 135 137  K 3.3* 3.5 3.3* 4.6  CL 95* 102 105 103  CO2 28 23 22 24   GLUCOSE 132* 133* 113* 129*  BUN 26* 20 10 7   CALCIUM 8.4* 7.7* 8.2* 9.0  CREATININE 1.64* 1.60* 0.91 0.91  GFRNONAA 34* 36* >60 >60  GFRAA 40* 41* >60 >60    LIVER FUNCTION TESTS:  Recent Labs  02/04/15 0205 10/27/15 0950 12/14/15 1005 12/26/15 0925  BILITOT 0.5 0.5 0.7  1.0  AST 46* 15 17 26   ALT 51* 13* 14 17  ALKPHOS 92 98 101 86  PROT 6.2 7.0 6.6 6.9  ALBUMIN 2.8* 3.6 3.5 3.0*    Assessment and Plan: (R)posterior pigtail chest tube CXR stable, scant output Chest tube removed at bedside without difficulty. Repeat CXR ordered.  Electronically Signed: Ascencion Dike 12/31/2015, 11:31 AM   I spent a total of 15 Minutes at the the patient's bedside AND on the patient's hospital floor or unit, greater than 50% of which was counseling/coordinating care for Rt chest tube drain

## 2015-12-31 NOTE — Progress Notes (Signed)
TCTS BRIEF PROGRESS NOTE   Patient has been unable to wean off O2 She has bibasilar rales and diminished breath sounds at base CXR looks wet with mild edema and small left effusion Will give lasix and hold discharge  Rexene Alberts, MD 12/31/2015 6:12 PM

## 2015-12-31 NOTE — Progress Notes (Signed)
No output this shift from [place] [facility] [employer].

## 2016-01-01 ENCOUNTER — Inpatient Hospital Stay (HOSPITAL_COMMUNITY): Payer: BLUE CROSS/BLUE SHIELD

## 2016-01-01 LAB — BASIC METABOLIC PANEL
ANION GAP: 10 (ref 5–15)
BUN: <5 mg/dL — ABNORMAL LOW (ref 6–20)
CO2: 32 mmol/L (ref 22–32)
Calcium: 8.5 mg/dL — ABNORMAL LOW (ref 8.9–10.3)
Chloride: 96 mmol/L — ABNORMAL LOW (ref 101–111)
Creatinine, Ser: 0.82 mg/dL (ref 0.44–1.00)
Glucose, Bld: 105 mg/dL — ABNORMAL HIGH (ref 65–99)
POTASSIUM: 4.2 mmol/L (ref 3.5–5.1)
SODIUM: 138 mmol/L (ref 135–145)

## 2016-01-01 LAB — CBC
HEMATOCRIT: 25.3 % — AB (ref 36.0–46.0)
HEMOGLOBIN: 8 g/dL — AB (ref 12.0–15.0)
MCH: 28 pg (ref 26.0–34.0)
MCHC: 31.6 g/dL (ref 30.0–36.0)
MCV: 88.5 fL (ref 78.0–100.0)
Platelets: 431 10*3/uL — ABNORMAL HIGH (ref 150–400)
RBC: 2.86 MIL/uL — ABNORMAL LOW (ref 3.87–5.11)
RDW: 14.9 % (ref 11.5–15.5)
WBC: 9.2 10*3/uL (ref 4.0–10.5)

## 2016-01-01 MED ORDER — DESVENLAFAXINE SUCCINATE ER 100 MG PO TB24
100.0000 mg | ORAL_TABLET | Freq: Every day | ORAL | Status: DC
Start: 2016-01-01 — End: 2016-01-02
  Administered 2016-01-01 – 2016-01-02 (×2): 100 mg via ORAL
  Filled 2016-01-01 (×2): qty 1

## 2016-01-01 MED ORDER — CIPROFLOXACIN HCL 500 MG PO TABS: 500.0000 mg | ORAL_TABLET | Freq: Two times a day (BID) | ORAL | Status: DC

## 2016-01-01 MED ORDER — POTASSIUM CHLORIDE ER 10 MEQ PO TBCR: EXTENDED_RELEASE_TABLET | ORAL | Status: DC

## 2016-01-01 MED ORDER — OXYCODONE HCL 5 MG PO TABS: 5.0000 mg | ORAL_TABLET | ORAL | Status: AC | PRN

## 2016-01-01 MED ORDER — FUROSEMIDE 10 MG/ML IJ SOLN
40.0000 mg | Freq: Once | INTRAMUSCULAR | Status: AC
  Administered 2016-01-01: 40 mg via INTRAVENOUS
  Filled 2016-01-01: qty 4

## 2016-01-01 MED ORDER — METOPROLOL TARTRATE 12.5 MG HALF TABLET
12.5000 mg | ORAL_TABLET | Freq: Two times a day (BID) | ORAL | Status: DC
  Administered 2016-01-01 – 2016-01-02 (×2): 12.5 mg via ORAL
  Filled 2016-01-01 (×3): qty 1

## 2016-01-01 MED ORDER — APIXABAN 5 MG PO TABS
5.0000 mg | ORAL_TABLET | Freq: Two times a day (BID) | ORAL | Status: DC
  Administered 2016-01-01 – 2016-01-02 (×3): 5 mg via ORAL
  Filled 2016-01-01 (×3): qty 1

## 2016-01-01 MED ORDER — FUROSEMIDE 40 MG PO TABS: ORAL_TABLET | ORAL | Status: DC

## 2016-01-01 MED ORDER — GUAIFENESIN ER 600 MG PO TB12
1200.0000 mg | ORAL_TABLET | Freq: Two times a day (BID) | ORAL | Status: DC
  Administered 2016-01-01 – 2016-01-02 (×2): 1200 mg via ORAL
  Filled 2016-01-01 (×2): qty 2

## 2016-01-01 NOTE — Progress Notes (Addendum)
CARDIAC REHAB PHASE I   PRE:  Rate/Rhythm: 77 SR  BP:  Sitting: 99/51        SaO2: 93 3L  MODE:  Ambulation: 150 ft   POST:  Rate/Rhythm: 86 SR  BP:  Sitting: 96/52         SaO2: 93 3L  Pt in bed, very flat affect, short responses, states she has not walked today. When asked if ready to walk pt states "it's always something." Pt ultimately agreeable to walk, states "fine, I don't care." Pt ambulated 150 ft on 3L O2, rolling walker, steady gait, tolerated well. Pt denies any complaints, just general fatigue, pt states "I haven't been doing much." Pt qualified for home O2 yesterday with cardiac rehab RN (see note). Pt still requiring 3L O2. Pt to bed per pt request after walk, call bell within reach. Encouraged increased ambulation, IS. Will follow.   [place], RN, BSN 01/01/2016 3:12 PM

## 2016-01-01 NOTE — Progress Notes (Signed)
RN went to check on pt and saw that pt had removed oxygen.  O2 sat 80% on RA while asleep.  3L [place] applied, O2 sat now 94%.  Pt resting comfortably.  Will cont to monitor pt closely.  Claudette Stapler, RN

## 2016-01-01 NOTE — Progress Notes (Signed)
Nurse said she was 86% on room air with ambulation. She is on 3 liters of oxygen with oxygen saturation 93-94%. Will discuss with Dr. Roxy Manns, but will hold off on discharge for now.

## 2016-01-01 NOTE — Progress Notes (Addendum)
      [name]       [name]             [phone]             Subjective: Patient feeling better. She hopes to go home today.  Objective: Vital signs in last 24 hours: Temp:  [98.7 F (37.1 C)-99 F (37.2 C)] 98.7 F (37.1 C) (02/24 0456) Pulse Rate:  [87-95] 87 (02/24 0456) Cardiac Rhythm:  [-] Normal sinus rhythm (02/23 1930) Resp:  [17-26] 18 (02/24 0456) BP: (104-114)/(49-64) 105/54 mmHg (02/24 0456) SpO2:  [85 %-98 %] 93 % (02/24 0456) Weight:  [190 lb 3.2 oz (86.274 kg)] 190 lb 3.2 oz (86.274 kg) (02/24 0456)   Current Weight  01/01/16 190 lb 3.2 oz (86.274 kg)      Intake/Output from previous day: 02/23 0701 - 02/24 0700 In: 1000 [P.O.:870; I.V.:80; IV Piggyback:50] Out: [id number] [Urine:4650]   Physical Exam:  Cardiovascular: RRR, no murmur, gallops, or rubs. Pulmonary: Slightly diminished at bases L>R Abdomen: Soft, non tender, bowel sounds present. Extremities: Trace bilateral lower extremity edema Wounds: Clean and dry.  No erythema or signs of infection.  Lab Results: CBC:  Recent Labs  12/30/15 0400 01/01/16 0320  WBC 11.3* 9.2  HGB 8.1* 8.0*  HCT 26.7* 25.3*  PLT 447* 431*   BMET:   Recent Labs  12/31/15 1223 01/01/16 0320  NA 138 138  K 4.2 4.2  CL 99* 96*  CO2 30 32  GLUCOSE 109* 105*  BUN <5* <5*  CREATININE 0.68 0.82  CALCIUM 8.7* 8.5*    PT/INR:  Lab Results  Component Value Date   INR 1.50* 12/28/2015   INR 1.98* 12/27/2015   INR 2.13* 12/26/2015   ABG:  INR: Will add last result for INR, ABG once components are confirmed Will add last 4 CBG results once components are confirmed  Assessment/Plan:  1. CV - SR in the. On Amiodarone 200 mg daily and Eliquis 5 mg bid. Will restart Lopressor 12.5 mg bid, as taken pre op.  2.  Pulmonary - S/p right thoracentesis with 900 cc of amber fluid removed. Pigtail catheter removed yesterday. CXR appears to show small, stable right hydro ptx,  small bilateral pleural effusions, and mild cardiomegaly. On room air this am. Hopefully, does not desat with ambulation and is off oxygen. 3. Anemia-H and H stable 8 and 25.3 4. ID-WBC decreased to 9200 and with low grade fever (99.).Changed to oral Cipro. Right pleural fluid culture and gram stain shows no organisms, no growth. Preliminary wound culture shows few Staph Aureus,  urine culture showed 40,000 Enterococcus, and Strep pneumo urine antigen negative. Per Dr. Roxy Manns, will continue Cipro at discharge for 2 weeks 5. Volume overload-had very good diuresis with IV Lasix yesterday. Will continue oral Lasix for 5 days after discharge. 6. Will discharge today as long as does not desat with ambulation  ZIMMERMAN,DONIELLE MPA-C 01/01/2016,7:34 AM     I have seen and examined the patient and agree with the assessment and plan as outlined.  Rexene Alberts, MD 01/01/2016

## 2016-01-01 NOTE — Progress Notes (Signed)
Dr. Roxy Manns removed oxygen and wanted patient checked on room air at rest and with ambulation. Attempted multiple ways to get oxygen readings. On finger and on ear saturations with good pleth reading 90-96% on room at rest (difficult to get as patient fidgets with fingers). Initially saturations fluctuated between 77-96%. Walked in hallway on room air and difficult to maintain an accurate saturation. Patient held my hand to keep from gripping walker. Saturations with good pleth reading 88-93% walked 50 feet. Patient feels upper respiratory congestion is causing her problem. No SOB. Patient want to go home but does not want to come back. Pt resting with call bell within reach.  Will continue to monitor. Payton Emerald, RN

## 2016-01-02 LAB — CULTURE, BODY FLUID-BOTTLE: CULTURE: NO GROWTH

## 2016-01-02 LAB — CULTURE, BODY FLUID W GRAM STAIN -BOTTLE

## 2016-01-02 MED ORDER — LEVOFLOXACIN 750 MG PO TABS: 750.0000 mg | ORAL_TABLET | Freq: Every day | ORAL | Status: DC

## 2016-01-02 MED ORDER — LEVOFLOXACIN 750 MG PO TABS
750.0000 mg | ORAL_TABLET | Freq: Every day | ORAL | Status: DC
  Administered 2016-01-02: 750 mg via ORAL
  Filled 2016-01-02: qty 1

## 2016-01-02 MED ORDER — GUAIFENESIN ER 600 MG PO TB12: 1200.0000 mg | ORAL_TABLET | Freq: Two times a day (BID) | ORAL | Status: DC

## 2016-01-02 MED ORDER — FUROSEMIDE 10 MG/ML IJ SOLN
40.0000 mg | Freq: Once | INTRAMUSCULAR | Status: AC
  Administered 2016-01-02: 40 mg via INTRAVENOUS
  Filled 2016-01-02: qty 4

## 2016-01-02 NOTE — Progress Notes (Signed)
SATURATION QUALIFICATIONS: (This note is used to comply with regulatory documentation for home oxygen)  Patient Saturations on Room Air at Rest = 90%  Patient Saturations on Room Air while Ambulating = 89%  Patient Saturations on 2 Liters of oxygen while Ambulating = 99%  Please briefly explain why patient needs home oxygen:

## 2016-01-02 NOTE — Progress Notes (Addendum)
[address]       [place] [address]             [phone]          Subjective: Feels fairly well, desat last night during sleep to 80%, currently off O2 at 90%sat  Objective: Vital signs in last 24 hours: Temp:  [98.3 F (36.8 C)-99.8 F (37.7 C)] 98.3 F (36.8 C) (02/25 0533) Pulse Rate:  [75-86] 75 (02/25 0533) Cardiac Rhythm:  [-] Normal sinus rhythm (02/25 0700) Resp:  [18-20] 18 (02/25 0533) BP: (100-105)/(49-61) 102/59 mmHg (02/25 0533) SpO2:  [92 %-98 %] 95 % (02/25 0533) Weight:  [184 lb 14.4 oz (83.87 kg)] 184 lb 14.4 oz (83.87 kg) (02/25 0533)  Hemodynamic parameters for last 24 hours:    Intake/Output from previous day: 02/24 0701 - 02/25 0700 In: 480 [P.O.:480] Out: 3200 [Urine:3200] Intake/Output this shift:    General appearance: alert, cooperative and no distress Heart: regular rate and rhythm Lungs: dim in bases Abdomen: benign Extremities: no edema Wound: incis healing well  Lab Results:  Recent Labs  01/01/16 0320  WBC 9.2  HGB 8.0*  HCT 25.3*  PLT 431*   BMET:  Recent Labs  12/31/15 1223 01/01/16 0320  NA 138 138  K 4.2 4.2  CL 99* 96*  CO2 30 32  GLUCOSE 109* 105*  BUN <5* <5*  CREATININE 0.68 0.82  CALCIUM 8.7* 8.5*    PT/INR: No results for input(s): LABPROT, INR in the last 72 hours. ABG    Component Value Date/Time   PHART 7.345* 12/17/2015 0013   HCO3 19.6* 12/17/2015 0013   TCO2 21 12/17/2015 1709   ACIDBASEDEF 5.0* 12/17/2015 0013   O2SAT 99.0 12/17/2015 0013   CBG (last 3)  No results for input(s): GLUCAP in the last 72 hours.  Meds Scheduled Meds: . amiodarone  200 mg Oral Daily  . apixaban  5 mg Oral BID  . ciprofloxacin  500 mg Oral BID  . desvenlafaxine  100 mg Oral QAC breakfast  . guaiFENesin  1,200 mg Oral BID  . metoprolol tartrate  12.5 mg Oral BID  . multivitamin with minerals  1 tablet Oral Daily  . pantoprazole  80 mg Oral Q1200   Continuous Infusions:  PRN  Meds:.sodium chloride, acetaminophen, cycloSPORINE, magnesium hydroxide, ondansetron (ZOFRAN) IV, oxyCODONE  Xrays Dg Chest 2 View  01/01/2016  CLINICAL DATA:  History of excision of atrial myxoma, Maze procedure, and atrial appendage clipping on December 16, 2015, sepsis, pleural effusions. EXAM: CHEST  2 VIEW COMPARISON:  Portable chest x-ray of December 31, 2015 FINDINGS: The lungs are reasonably well inflated. There are bilateral pleural effusions likely with some loculation on the right. Confluent alveolar opacity in the right upper and right infrahilar regions is present. There is left lower lobe atelectasis or pneumonia. The cardiac silhouette is top-normal in size. A left atrial appendage clip is present. The pulmonary vascularity is not engorged. The mediastinum is normal in width. The bony thorax is unremarkable. IMPRESSION: 1. Interstitial and alveolar opacities bilaterally consistent with pneumonia. Small bilateral pleural effusions. No definite pneumothorax evident today. 2. Mild cardiomegaly without significant pulmonary vascular congestion. Electronically Signed   By: David  Martinique M.D.   On: 01/01/2016 08:09   Dg Chest 2 View  12/31/2015  CLINICAL DATA:  Patient with history of pleural effusion and drainage. EXAM: CHEST  2 VIEW COMPARISON:  Chest radiograph 12/29/2015 FINDINGS: Right pigtail drainage catheter stable  in position. Small right hydropneumothorax, most notable at the lung base. Unchanged heterogeneous opacities throughout the right lung. Moderate left pleural effusion with underlying opacities, unchanged. Thoracic spine degenerative changes. Grossly stable cardiac and mediastinal contours. IMPRESSION: Grossly unchanged placement of right pigtail pleural drainage catheter with small right basilar hydro pneumothorax. Grossly unchanged diffuse airspace opacities throughout the right lung. Unchanged left pleural effusion and underlying atelectasis. Electronically Signed   By: Lovey Newcomer M.D.   On: 12/31/2015 10:56   Dg Chest Port 1 View  12/31/2015  CLINICAL DATA:  Right chest tube removal. Loculated pleural effusion. EXAM: PORTABLE CHEST 1 VIEW COMPARISON:  Radiographs 12/31/2015 and 12/29/2015.  CT 12/26/2015 FINDINGS: 1154 hours. The pigtail catheter has been removed from the medial aspect of the right hemithorax. Small underlying right-sided hydro pneumothorax is unchanged. There is a small amount residual loculated pleural fluid on the right and dependent pleural fluid on the left. Underlying pulmonary opacities and mild cardiomegaly are stable. IMPRESSION: Stable small right-sided hydro pneumothorax following right chest tube removal. No other significant changes. Electronically Signed   By: Richardean Sale M.D.   On: 12/31/2015 12:11    Assessment/Plan:  1 afeb and hemodyn stable 2 airspace opacities on yest CXR are stable but oxygenation remains intermit low- will see how she does with ambulation this am- may need to consider home O2 at discharge. Consider stopping amiodarone as could be a component of pulm toxicity    LOS: 7 days    GOLD,WAYNE E 01/02/2016   I have seen and examined the patient and agree with the assessment and plan as outlined.  However, patient seems to be doing fine without oxygen at this time.  She has ambulated twice on room air.  Will change Cipro to Levaquin for better coverage in case right lung opacity is secondary to pneumonia and continue x 14 days.  Stop amiodarone.  Continue lasix x 5 days.  D/C home.  Will plan to see in office on Monday March 6th with CXR  Rexene Alberts, MD 01/02/2016 12:38 PM

## 2016-01-02 NOTE — Progress Notes (Addendum)
CARDIAC REHAB PHASE I   PRE:  Rate/Rhythm: 68 sinus rhythm  BP:  Supine:   Sitting: 100/59  Standing:    SaO2: 92% ra  MODE:  Ambulation: 350 ft   POST:  Rate/Rhythem: 77 sinus rhythm  BP:  Supine:   Sitting: 102/57  Standing:    SaO2: 98% ra  1035-1055  Pt ambulated in hallway x1 assist.  Brisk gait, pt tired quickly.  O2 sat-88% ra , pt c/o fatigue.   Rest break encouraged.  O2 Sat up to 95% ra  with PLB and standing  rest break.  Pt able to ambulate without further difficulty to room.  O2 sat -98% ra  sitting post exercise.  Pt encouraged to use walker, slower pace and take rest break to increase stamina. Pt encouraged to continue good pulmonary hygiene and IS use.  Pt very eager to go home.  Pt returned bed, sitting.  Pt ambulating indepedently in her room. Call light in reach.    [place]  2

## 2016-01-02 NOTE — Discharge Instructions (Signed)
° °  Antibiotic Medicine °Antibiotic medicines are used to treat infections caused by bacteria. They work by hurting or killing the germs that are making you sick. °HOW WILL MY MEDICINE BE PICKED? °There are many kinds of antibiotic medicines. To help your doctor pick one, tell your doctor if: °· You have any allergies. °· You are pregnant or plan to get pregnant. °· You are breastfeeding. °· You are taking any medicines. These include over-the-counter medicines, prescription medicines, and herbal remedies. °· You have a medical condition or problem. °If you have questions about why your medicine was picked, ask. °FOR HOW LONG SHOULD I TAKE MY MEDICINE? °Take your medicine for as long as your doctor tells you to. Do not stop taking it when you feel better. If you stop taking it too soon: °· You may start to feel sick again. °· Your infection may get harder to treat. °· New problems may develop. °WHAT IF I MISS A DOSE? °Try not to miss any doses of antibiotic medicine. If you miss a dose: °· Take the dose as soon as you can. °· If you are taking 2 doses a day, take the next dose in 5 to 6 hours. °· If you are taking 3 or more doses a day, take the next dose in 2 to 4 hours. Then go back to the normal schedule. °If you cannot take a missed dose, take the next dose on time. Then take the missed dose after you have taken all the doses as told by your doctor, as if you had one more dose left. °DOES THIS MEDICINE AFFECT BIRTH CONTROL? °Birth control pills may not work while you are on antibiotic medicines. If you are taking birth control pills, keep taking them as usual. Use a second form of birth control, such as a condom. Keep using the second form of birth control until you are finished with your current 1 month cycle of birth control pills. °GET HELP IF: °· You get worse. °· You do not feel better a few days after starting the medicine. °· You throw up (vomit). °· There are white patches in your mouth. °· You have new  joint pain after starting the medicine. °· You have new muscle aches after starting the medicine. °· You had a fever before starting the medicine, and it comes back. °· You have any symptoms of an allergic reaction, such as an itchy rash. If this happens, stop taking the medicine. °GET HELP RIGHT AWAY IF: °· Your pee (urine) turns dark or becomes blood-colored. °· Your skin turns yellow. °· You bruise or bleed easily. °· You have very bad watery poop (diarrhea) and cramps in your belly (abdomen). °· You have a very bad headache. °· You have signs of a very bad allergic reaction, such as: °¨ Trouble breathing. °¨ Wheezing. °¨ Swelling of the lips, tongue, or face. °¨ Fainting. °¨ Blisters on the skin or in the mouth. °If you have signs of a very bad allergic reaction, stop taking the antibiotic medicine right away. °  °This information is not intended to replace advice given to you by your health care provider. Make sure you discuss any questions you have with your health care provider. °  °Document Released: 08/02/2008 Document Revised: 07/15/2015 Document Reviewed: 03/11/2015 °[employer]vier Interactive Patient Education ©2016 [employer] ° °

## 2016-01-06 ENCOUNTER — Ambulatory Visit: Payer: BLUE CROSS/BLUE SHIELD | Admitting: Physician Assistant

## 2016-01-08 ENCOUNTER — Other Ambulatory Visit: Payer: Self-pay | Admitting: Thoracic Surgery (Cardiothoracic Vascular Surgery)

## 2016-01-08 DIAGNOSIS — D151 Benign neoplasm of heart: Secondary | ICD-10-CM

## 2016-01-11 ENCOUNTER — Encounter: Payer: Self-pay | Admitting: Thoracic Surgery (Cardiothoracic Vascular Surgery)

## 2016-01-11 ENCOUNTER — Ambulatory Visit (INDEPENDENT_AMBULATORY_CARE_PROVIDER_SITE_OTHER): Payer: Self-pay | Admitting: Thoracic Surgery (Cardiothoracic Vascular Surgery)

## 2016-01-11 ENCOUNTER — Ambulatory Visit
Admission: RE | Admit: 2016-01-11 | Discharge: 2016-01-11 | Disposition: A | Discharge status: Home / Self Care | Payer: BLUE CROSS/BLUE SHIELD | Source: Ambulatory Visit | Attending: Thoracic Surgery (Cardiothoracic Vascular Surgery) | Admitting: Thoracic Surgery (Cardiothoracic Vascular Surgery)

## 2016-01-11 ENCOUNTER — Ambulatory Visit: Payer: BLUE CROSS/BLUE SHIELD | Admitting: Thoracic Surgery (Cardiothoracic Vascular Surgery)

## 2016-01-11 VITALS — BP 100/64 | HR 58 | Resp 20 | Ht 67.0 in | Wt 184.0 lb

## 2016-01-11 DIAGNOSIS — R222 Localized swelling, mass and lump, trunk: Secondary | ICD-10-CM

## 2016-01-11 DIAGNOSIS — Z9889 Other specified postprocedural states: Secondary | ICD-10-CM

## 2016-01-11 DIAGNOSIS — I480 Paroxysmal atrial fibrillation: Secondary | ICD-10-CM

## 2016-01-11 DIAGNOSIS — D151 Benign neoplasm of heart: Secondary | ICD-10-CM

## 2016-01-11 DIAGNOSIS — Z8679 Personal history of other diseases of the circulatory system: Secondary | ICD-10-CM

## 2016-01-11 DIAGNOSIS — I5189 Other ill-defined heart diseases: Secondary | ICD-10-CM

## 2016-01-11 NOTE — Patient Instructions (Signed)
Continue all previous medications without any changes at this time  You may continue to gradually increase your physical activity as tolerated.  Refrain from any heavy lifting or strenuous use of your arms and shoulders until at least 8 weeks from the time of your surgery, and avoid activities that cause increased pain in your chest on the side of your surgical incision.  Otherwise you may continue to increase activities without any particular limitations.  Increase the intensity and duration of physical activity gradually.  You may return to driving an automobile as long as you are no longer requiring oral narcotic pain relievers during the daytime.  It would be wise to start driving only short distances during the daylight and gradually increase from there as you feel comfortable.  You are encouraged to enroll and participate in the outpatient cardiac rehab program beginning as soon as practical.

## 2016-01-11 NOTE — Progress Notes (Signed)
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  The patient's early postoperative recovery was uncomplicated and she was discharged from the hospital in sinus rhythm on the sixth postoperative day.  She was readmitted to the hospital 4 days later with increased right-sided chest pain, leukocytosis, and fevers. Chest x-ray and subsequent CT scan demonstrated a loculated pleural effusion on the right side with parenchymal opacity in the right lung consistent with pneumonia and possible empyema. She was treated with broad-spectrum intravenous antibiotics and percutaneous CT-guided drainage of her loculated pleural effusion.  Blood cultures and pleural fluid cultures were sterile. The patient did have abnormal urinalysis and urine culture that grew enterococcus. She clinically did very well and was discharged from the hospital on 01/02/2016 on oral Levaquin. She returns to our office for routine follow-up today. She states that she has been gradually improving ever since hospital discharge. She still has soreness in the right side of her chest. She has not had any palpitations or other signs to suggest a recurrence of atrial fibrillation. She has been afebrile. She denies cough or shortness of breath.  Appetite is improving. Overall she has no complaints with exception of the persistent soreness in her chest.   Current Outpatient Prescriptions  Medication Sig Dispense Refill  . apixaban (ELIQUIS) 5 MG TABS tablet Take 1 tablet (5 mg total) by mouth 2 (two) times daily. 180 tablet 3  . cycloSPORINE (RESTASIS) 0.05 % ophthalmic emulsion Place 1 drop into both eyes 2 (two) times  daily as needed (for dry eyes).     Marland Kitchen desvenlafaxine (PRISTIQ) 50 MG 24 hr tablet Take 100 mg by mouth daily before breakfast.     . guaiFENesin (MUCINEX) 600 MG 12 hr tablet Take 2 tablets (1,200 mg total) by mouth 2 (two) times daily. 30 tablet 0  . levofloxacin (LEVAQUIN) 750 MG tablet Take 1 tablet (750 mg total) by mouth daily. 14 tablet 0  . LORazepam (ATIVAN) 0.5 MG tablet Take 0.5 mg by mouth at bedtime.     . metoprolol tartrate (LOPRESSOR) 25 MG tablet Take 0.5 tablets (12.5 mg total) by mouth 2 (two) times daily. 30 tablet 1  . Multiple Vitamin (MULITIVITAMIN WITH MINERALS) TABS Take 1 tablet by mouth daily.    Marland Kitchen NEXIUM 40 MG capsule Take 40 mg by mouth daily as needed (for heartburn).     . ondansetron (ZOFRAN ODT) 4 MG disintegrating tablet Take 1 tablet (4 mg total) by mouth every 8 (eight) hours as needed for nausea or vomiting. 10 tablet 0  . oxyCODONE (OXY IR/ROXICODONE) 5 MG immediate release tablet Take 1-2 tablets (5-10 mg total) by mouth every 4 (four) hours as needed for severe pain. 14 tablet 0  . rizatriptan (MAXALT-MLT) 10 MG disintegrating tablet Take 10 mg by mouth daily as needed for migraine.     . triamcinolone cream (KENALOG) 0.1 % Apply 1 application topically daily as needed (for rash). Apply after bathing  0  . zolpidem (AMBIEN) 5 MG tablet Take 5 mg by mouth at bedtime.  0   No current facility-administered medications for this  visit.      Physical Exam:   BP 100/64 mmHg  Pulse 58  Resp 20  Ht 5\' 7"  (1.702 m)  Wt 184 lb (83.462 kg)  BMI 28.81 kg/m2  SpO2 98%  LMP 12/25/2012  General:  Well-appearing  Chest:   Clear to auscultation  CV:   Regular rate and rhythm without murmur  Incisions:  Clean and dry healing nicely  Abdomen:  Soft and nontender  Extremities:  Warm and well-perfused  Diagnostic Tests:  2 channel telemetry rhythm strip demonstrates normal sinus rhythm   CHEST 2 VIEW  COMPARISON: 12/31/2015 and  01/01/2016  FINDINGS: Cardiomediastinal silhouette is stable. There is improvement in aeration in right lung with small residual right basilar atelectasis infiltrate or scarring. No pulmonary edema. Small residual right pleural effusion. Left lung is clear. Left atrial appendage clip is unchanged in position.  IMPRESSION: Improvement in aeration. Small right pleural effusion with residual right basilar atelectasis, scarring or infiltrate. No pulmonary edema. Left lung is clear.   Electronically Signed  By: Lahoma Crocker M.D.  On: 01/11/2016 14:24   Impression:  Patient is doing well approximately one month status post minimally invasive resection of left atrial papillary fiber in the last stoma and Maze procedure. She is maintaining sinus rhythm. Her recent febrile illness has resolved. Her pleural effusions have resolved.   Plan:  I have encouraged the patient to continue to gradually increase her physical activity as tolerated.  She has been reminded to avoid any heavy lifting or strenuous use of her arms or shoulders. Once she no longer needs oral narcotic pain relievers during the daytime she may resume driving an automobile. We have not recommended any changes to her current medications at this time.  I have encouraged her to consider enrolling and participating in the outpatient cardiac rehabilitation program. The patient will return for routine follow-up and rhythm check in 2 months.    Valentina Gu. Roxy Manns, MD 01/11/2016 3:40 PM

## 2016-01-18 ENCOUNTER — Ambulatory Visit: Payer: BLUE CROSS/BLUE SHIELD | Admitting: Thoracic Surgery (Cardiothoracic Vascular Surgery)

## 2016-01-25 ENCOUNTER — Ambulatory Visit: Payer: BLUE CROSS/BLUE SHIELD | Admitting: Thoracic Surgery (Cardiothoracic Vascular Surgery)

## 2016-02-05 NOTE — Progress Notes (Signed)
This encounter was created in error - please disregard.

## 2016-03-14 ENCOUNTER — Ambulatory Visit (INDEPENDENT_AMBULATORY_CARE_PROVIDER_SITE_OTHER): Payer: Self-pay | Admitting: Thoracic Surgery (Cardiothoracic Vascular Surgery)

## 2016-03-14 ENCOUNTER — Encounter: Payer: Self-pay | Admitting: Thoracic Surgery (Cardiothoracic Vascular Surgery)

## 2016-03-14 VITALS — BP 136/80 | HR 63 | Resp 20 | Ht 67.0 in

## 2016-03-14 DIAGNOSIS — Z9889 Other specified postprocedural states: Secondary | ICD-10-CM

## 2016-03-14 DIAGNOSIS — Z8679 Personal history of other diseases of the circulatory system: Secondary | ICD-10-CM

## 2016-03-14 DIAGNOSIS — D151 Benign neoplasm of heart: Secondary | ICD-10-CM

## 2016-03-14 MED ORDER — AMIODARONE HCL 200 MG PO TABS: 100.0000 mg | ORAL_TABLET | Freq: Every day | ORAL | Status: DC

## 2016-03-14 MED ORDER — AMIODARONE HCL 200 MG PO TABS: 200.0000 mg | ORAL_TABLET | Freq: Every day | ORAL | Status: DC

## 2016-03-14 NOTE — Patient Instructions (Signed)
Decrease amiodarone to 100 mg (1/2 tablet) daily  Continue all other previous medications without any changes at this time  You may resume unrestricted physical activity without any particular limitations at this time.

## 2016-03-14 NOTE — Progress Notes (Signed)
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 She was last seen here in our office on 01/11/2016. Since then she has continued to do very well. The patient states that she has not had any palpitations or other symptoms to suggest a recurrence of atrial fibrillation or atrial flutter. The soreness in her chest has resolved. She has no exertional shortness of breath. She has been walking regularly and now reports being essentially close to her baseline. She has no exertional shortness of breath. She states that she just recently her from the cardiac rehabilitation program and she told them that it's too late to participate:  she feels that has already recovered almost completely.  Overall she is quite pleased with her progress.   Current Outpatient Prescriptions  Medication Sig Dispense Refill  . apixaban (ELIQUIS) 5 MG TABS tablet Take 1 tablet (5 mg total) by mouth 2 (two) times daily. 180 tablet 3  . cycloSPORINE (RESTASIS) 0.05 % ophthalmic emulsion Place 1 drop into both eyes 2 (two) times daily as needed (for dry eyes).     Marland Kitchen desvenlafaxine (PRISTIQ) 50 MG 24 hr tablet Take 100 mg by mouth daily before breakfast.     . LORazepam (ATIVAN) 0.5 MG tablet Take 0.5 mg by mouth at bedtime.     . metoprolol tartrate (LOPRESSOR) 25 MG tablet Take 0.5 tablets (12.5 mg total) by mouth 2 (two) times daily. 30 tablet 1  . Multiple Vitamin (MULITIVITAMIN WITH MINERALS) TABS Take 1 tablet by mouth daily.    Marland Kitchen NEXIUM 40 MG capsule Take 40 mg by mouth daily as needed (for heartburn).     Marland Kitchen oxyCODONE (OXY  IR/ROXICODONE) 5 MG immediate release tablet Take 1-2 tablets (5-10 mg total) by mouth every 4 (four) hours as needed for severe pain. 14 tablet 0  . rizatriptan (MAXALT-MLT) 10 MG disintegrating tablet Take 10 mg by mouth daily as needed for migraine.     Marland Kitchen amiodarone (PACERONE) 200 MG tablet Take 1 tablet (200 mg total) by mouth daily. 60 tablet 0   No current facility-administered medications for this visit.      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 She is maintaining sinus rhythm and overall progressing well.  Plan:  I have instructed the patient to decrease her dose of amiodarone to 100 mg daily. At some point might be possible to discontinue amiodarone altogether.  We have not recommended any other changes to her current medications. I have encouraged her to continue to gradually  increase her physical activity without any particular limitations at this time other than those associated with long-term anticoagulation. All of her questions have been addressed. She will plan to contact Dr. Kennon Holter office to make sure that she is scheduled for routine follow-up appointment. She will return to the atrial fibrillation clinic for routine follow-up in 3 months. She will return to our office next February, approximately 1 year following her original surgery.  I spent in excess of 15 minutes during the conduct of this office consultation and >50% of this time involved direct face-to-face  encounter with the patient for counseling and/or coordination of their care.    Valentina Gu. Roxy Manns, MD 03/14/2016 12:06 PM

## 2016-03-15 ENCOUNTER — Telehealth: Payer: Self-pay | Admitting: Cardiovascular Disease

## 2016-03-18 NOTE — Telephone Encounter (Signed)
Closed Encounter  °

## 2016-03-24 ENCOUNTER — Other Ambulatory Visit: Payer: Self-pay | Admitting: Cardiovascular Disease

## 2016-03-24 DIAGNOSIS — I480 Paroxysmal atrial fibrillation: Secondary | ICD-10-CM

## 2016-03-24 MED ORDER — METOPROLOL TARTRATE 25 MG PO TABS: 12.5000 mg | ORAL_TABLET | Freq: Two times a day (BID) | ORAL | Status: DC

## 2016-03-24 NOTE — Telephone Encounter (Signed)
Rx request sent to pharmacy.  

## 2016-04-05 ENCOUNTER — Ambulatory Visit: Payer: BLUE CROSS/BLUE SHIELD | Admitting: Cardiovascular Disease

## 2016-04-06 ENCOUNTER — Telehealth (HOSPITAL_COMMUNITY): Payer: Self-pay | Admitting: Cardiac Rehabilitation

## 2016-04-06 NOTE — Telephone Encounter (Signed)
pc to [employer] to verify benefits for cardiac rehab.  Pt has met $200 deductible and $500 out of pocket expense.  ICD-10 code D15.1 for CPT 9371667992  covered 100%.  Reference# [id number].

## 2016-04-07 ENCOUNTER — Encounter (HOSPITAL_COMMUNITY): Admission: RE | Admit: 2016-04-07 | Payer: BLUE CROSS/BLUE SHIELD | Source: Ambulatory Visit

## 2016-04-13 ENCOUNTER — Ambulatory Visit (HOSPITAL_COMMUNITY): Payer: BLUE CROSS/BLUE SHIELD

## 2016-04-15 ENCOUNTER — Ambulatory Visit (HOSPITAL_COMMUNITY): Payer: BLUE CROSS/BLUE SHIELD

## 2016-04-18 ENCOUNTER — Ambulatory Visit (HOSPITAL_COMMUNITY): Payer: BLUE CROSS/BLUE SHIELD

## 2016-04-20 ENCOUNTER — Ambulatory Visit (HOSPITAL_COMMUNITY): Payer: BLUE CROSS/BLUE SHIELD

## 2016-04-21 ENCOUNTER — Other Ambulatory Visit: Payer: Self-pay

## 2016-04-21 DIAGNOSIS — I480 Paroxysmal atrial fibrillation: Secondary | ICD-10-CM

## 2016-04-21 MED ORDER — METOPROLOL TARTRATE 25 MG PO TABS
12.5000 mg | ORAL_TABLET | Freq: Two times a day (BID) | ORAL | Status: DC
Start: 2016-04-21 — End: 2016-04-27

## 2016-04-22 ENCOUNTER — Ambulatory Visit (HOSPITAL_COMMUNITY): Payer: BLUE CROSS/BLUE SHIELD

## 2016-04-25 ENCOUNTER — Ambulatory Visit (HOSPITAL_COMMUNITY): Payer: BLUE CROSS/BLUE SHIELD

## 2016-04-27 ENCOUNTER — Telehealth: Payer: Self-pay | Admitting: *Deleted

## 2016-04-27 ENCOUNTER — Ambulatory Visit (HOSPITAL_COMMUNITY): Payer: BLUE CROSS/BLUE SHIELD

## 2016-04-27 ENCOUNTER — Ambulatory Visit (INDEPENDENT_AMBULATORY_CARE_PROVIDER_SITE_OTHER): Payer: BLUE CROSS/BLUE SHIELD | Admitting: Cardiovascular Disease

## 2016-04-27 ENCOUNTER — Encounter: Payer: Self-pay | Admitting: Cardiovascular Disease

## 2016-04-27 VITALS — BP 110/78 | HR 58 | Ht 67.0 in | Wt 176.0 lb

## 2016-04-27 DIAGNOSIS — I480 Paroxysmal atrial fibrillation: Secondary | ICD-10-CM

## 2016-04-27 MED ORDER — METOPROLOL TARTRATE 25 MG PO TABS: 12.5000 mg | ORAL_TABLET | Freq: Two times a day (BID) | ORAL | Status: DC

## 2016-04-27 NOTE — Patient Instructions (Signed)
Medication Instructions:  Your physician recommends that you continue on your current medications as directed. Please refer to the Current Medication list given to you today.   Labwork: none  Testing/Procedures: none  Follow-Up: Your physician wants you to follow-up in: [place]. You will receive a reminder letter in the mail two months in advance. If you don't receive a letter, please call our office to schedule the follow-up appointment.  You have been referred to [facility].   Any Other Special Instructions Will Be Listed Below (If Applicable).     If you need a refill on your cardiac medications before your next appointment, please call your pharmacy.

## 2016-04-27 NOTE — Assessment & Plan Note (Signed)
History of atrial fib/atrial flutter with a left atrial fibrillar stoma status post surgical excision of this by Dr. Roxy Manns in March of this year along with left atrial maze procedure.. She has maintained sinus rhythm. She feels clinically improved. Her LV function is normal. She remains on low-dose amiodarone as well as Eloquis oral anticoagulation. I'm going to refer her back to Doristine Devoid NP at the A. Fib clinic to address the need to continue these medications.

## 2016-04-27 NOTE — Telephone Encounter (Signed)
Spoke with patient regarding appointment with Roderic Palau in A Fib clinic---Wednesday 05/11/16 @ 11:00 am--parking code [id number].  Patient voiced her understanding.

## 2016-04-27 NOTE — Progress Notes (Signed)
04/27/2016 Miranda Taylor   1960-12-12  [id number]  Primary Physician Bing Matter, PA-C Primary Cardiologist: Lorretta Harp MD Renae Gloss  HPI:  The patient is a delightful 55 year old mildly overweight married Caucasian female, mother of 62 and grandmother to 2 grandchildren, whom I last saw in the office 04/17/15.Marland Kitchen She has a history of symptomatic paroxysmal atrial fibrillation with a low [place] score on beta-blocker and aspirin. She had a normal 2D echo and stress test. I did an event monitor that showed multiple episodes of PAF, which she is symptomatic from with complaints of shortness of breath and fatigue. I referred her to Dr. Thompson Grayer at [facility], Electrophysiologist, who placed her on flecainide 50 p.o. b.i.d. empirically. Her symptoms have improved.she had symptoms compatible with obstructive sleep apnea when I last saw her 2 years ago and a subsequent outpatient sleep study did not suggest this. She was recently admitted to the hospital 02/03/15 for one week with A. Fib with RVR. A transesophageal echo showed reduced systolic function with [id number] range with left atrial smoke/thrombus and therefore cardioversion was not performed. She was placed on amiodarone and rate controlled and placed on oral anticoagulation. She subsequently converted o normal sinus rhythm. 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 Roxy Manns which was performed in March. Her postoperative echo revealed normal LV function. She feels clinically improved. Her amiodarone has been reduced to 100 mg a day and she remains on Eloquis  oral hydration.    Current Outpatient Prescriptions  Medication Sig Dispense Refill  . amiodarone (PACERONE) 200 MG tablet Take 0.5 tablets (100 mg total) by mouth daily. 60 tablet 0  . apixaban (ELIQUIS) 5 MG TABS tablet Take 1  tablet (5 mg total) by mouth 2 (two) times daily. 180 tablet 3  . cycloSPORINE (RESTASIS) 0.05 % ophthalmic emulsion Place 1 drop into both eyes 2 (two) times daily as needed (for dry eyes).     Marland Kitchen desvenlafaxine (PRISTIQ) 50 MG 24 hr tablet Take 100 mg by mouth daily before breakfast.     . LORazepam (ATIVAN) 0.5 MG tablet Take 0.5 mg by mouth at bedtime.     . metoprolol tartrate (LOPRESSOR) 25 MG tablet Take 0.5 tablets (12.5 mg total) by mouth 2 (two) times daily. 30 tablet 0  . Multiple Vitamin (MULITIVITAMIN WITH MINERALS) TABS Take 1 tablet by mouth daily.    Marland Kitchen NEXIUM 40 MG capsule Take 40 mg by mouth daily as needed (for heartburn).     Marland Kitchen oxyCODONE (OXY IR/ROXICODONE) 5 MG immediate release tablet Take 1-2 tablets (5-10 mg total) by mouth every 4 (four) hours as needed for severe pain. 14 tablet 0  . rizatriptan (MAXALT-MLT) 10 MG disintegrating tablet Take 10 mg by mouth daily as needed for migraine.      No current facility-administered medications for this visit.    No Known Allergies  Social History   Social History  . Marital Status: Married    Spouse Name: N/A  . Number of Children: N/A  . Years of Education: N/A   Occupational History  . Disabled    Social History Main Topics  . Smoking status: Former Smoker -- 0.50 packs/day for 20 years    Types: Cigarettes    Quit date: 11/07/2001  . Smokeless tobacco: Never Used  . Alcohol Use: No  . Drug Use: No  .  Sexual Activity: Not on file   Other Topics Concern  . Not on file   Social History Narrative   Pt lives with husband in [place].  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  Cardiovascular: negative for chest pain, dyspnea on exertion, edema, orthopnea, palpitations, paroxysmal nocturnal dyspnea or shortness of breath Dermatological: negative for rash Respiratory: negative  for cough or wheezing Urologic: negative for hematuria Abdominal: negative for nausea, vomiting, diarrhea, bright red blood per rectum, melena, or hematemesis Neurologic: negative for visual changes, syncope, or dizziness All other systems reviewed and are otherwise negative except as noted above.    Blood pressure 110/78, pulse 58, height 5\' 7"  (1.702 m), weight 176 lb (79.833 kg), last menstrual period 12/25/2012.  General appearance: alert and no distress Neck: no adenopathy, no carotid bruit, no JVD, supple, symmetrical, trachea midline and thyroid not enlarged, symmetric, no tenderness/mass/nodules Lungs: clear to auscultation bilaterally Heart: regular rate and rhythm, S1, S2 normal, no murmur, click, rub or gallop Extremities: extremities normal, atraumatic, no cyanosis or edema  EKG not performed today  ASSESSMENT AND PLAN:   Atrial flutter with rapid ventricular response (HCC) History of atrial fib/atrial flutter with a left atrial fibrillar stoma status post surgical excision of this by Dr. Roxy Manns in March of this year along with left atrial maze procedure.. She has maintained sinus rhythm. She feels clinically improved. Her LV function is normal. She remains on low-dose amiodarone as well as Eloquis oral anticoagulation. I'm going to refer her back to Doristine Devoid NP at the A. Fib clinic to address the need to continue these medications.      Lorretta Harp MD FACP,FACC,FAHA, [facility] 04/27/2016 9:42 AM

## 2016-04-29 ENCOUNTER — Ambulatory Visit (HOSPITAL_COMMUNITY): Payer: BLUE CROSS/BLUE SHIELD

## 2016-05-02 ENCOUNTER — Ambulatory Visit (HOSPITAL_COMMUNITY): Payer: BLUE CROSS/BLUE SHIELD

## 2016-05-04 ENCOUNTER — Ambulatory Visit (HOSPITAL_COMMUNITY): Payer: BLUE CROSS/BLUE SHIELD

## 2016-05-06 ENCOUNTER — Ambulatory Visit (HOSPITAL_COMMUNITY): Payer: BLUE CROSS/BLUE SHIELD

## 2016-05-09 ENCOUNTER — Ambulatory Visit (HOSPITAL_COMMUNITY): Payer: BLUE CROSS/BLUE SHIELD

## 2016-05-11 ENCOUNTER — Ambulatory Visit (HOSPITAL_COMMUNITY)
Admission: RE | Admit: 2016-05-11 | Discharge: 2016-05-11 | Disposition: A | Discharge status: Home / Self Care | Payer: BLUE CROSS/BLUE SHIELD | Source: Ambulatory Visit | Attending: Nurse Practitioner | Admitting: Nurse Practitioner

## 2016-05-11 ENCOUNTER — Ambulatory Visit (HOSPITAL_COMMUNITY): Payer: BLUE CROSS/BLUE SHIELD

## 2016-05-11 ENCOUNTER — Encounter (HOSPITAL_COMMUNITY): Payer: Self-pay | Admitting: Nurse Practitioner

## 2016-05-11 VITALS — BP 118/84 | HR 53 | Ht 67.0 in | Wt 177.0 lb

## 2016-05-11 DIAGNOSIS — I480 Paroxysmal atrial fibrillation: Secondary | ICD-10-CM | POA: Diagnosis not present

## 2016-05-11 DIAGNOSIS — K219 Gastro-esophageal reflux disease without esophagitis: Secondary | ICD-10-CM | POA: Insufficient documentation

## 2016-05-11 DIAGNOSIS — Z7901 Long term (current) use of anticoagulants: Secondary | ICD-10-CM | POA: Diagnosis not present

## 2016-05-11 DIAGNOSIS — Z87891 Personal history of nicotine dependence: Secondary | ICD-10-CM | POA: Insufficient documentation

## 2016-05-11 DIAGNOSIS — Z79899 Other long term (current) drug therapy: Secondary | ICD-10-CM | POA: Diagnosis not present

## 2016-05-11 DIAGNOSIS — F419 Anxiety disorder, unspecified: Secondary | ICD-10-CM | POA: Insufficient documentation

## 2016-05-11 DIAGNOSIS — R001 Bradycardia, unspecified: Secondary | ICD-10-CM | POA: Diagnosis not present

## 2016-05-11 DIAGNOSIS — F329 Major depressive disorder, single episode, unspecified: Secondary | ICD-10-CM | POA: Diagnosis not present

## 2016-05-11 DIAGNOSIS — M1990 Unspecified osteoarthritis, unspecified site: Secondary | ICD-10-CM | POA: Insufficient documentation

## 2016-05-11 DIAGNOSIS — I10 Essential (primary) hypertension: Secondary | ICD-10-CM | POA: Diagnosis not present

## 2016-05-11 DIAGNOSIS — I4891 Unspecified atrial fibrillation: Secondary | ICD-10-CM | POA: Diagnosis present

## 2016-05-11 NOTE — Progress Notes (Signed)
Patient ID: Miranda Taylor, female   DOB: [birth year], 55 y.o.   MRN: [id number]     Primary Care Physician: Tula Nakayama Referring Physician: Dr. Gwenlyn Found Cardiologist: Dr. Gwenlyn Found CV surgeon: Dr. Aloha Gell is a 55 y.o. 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 No palpitations to suggest reoccurrence of afib. She saw Dr. Gwenlyn Found recently saw pt and deferred medicine changes to this office. She has been on 100 mg of amiodarone since early May without palpitations and can come off drug. Chadsvasc score of one that I can determine for female. So feel that [place] can be stopped as well but will review with Dr. Rayann Heman.  Today, she denies symptoms of palpitations, chest pain, shortness of breath, orthopnea, PND, lower extremity edema, dizziness, presyncope, syncope, or neurologic sequela. The patient is tolerating medications without difficulties and is otherwise without complaint today.   Past Medical History  Diagnosis Date  . GERD (gastroesophageal reflux disease)   . Chronic lower back pain   . Paroxysmal atrial fibrillation (HCC)   . Spinal stenosis   . SUI (stress urinary incontinence, female)   . History of kidney stones   . TMJ (temporomandibular joint syndrome)     LEFT SIDE--  WEAR  MOUTH GUARD  . H/O hiatal hernia   . Plantar fasciitis   . Typical atrial flutter ([place])   . 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 Heart murmur   . Hypertension   . Dysrhythmia 02/2015    afib  . Depression   . Anxiety   . Migraines     h/o migraines   . Arthritis     low back, degenerative spine   . S/P Minimally-invasive maze operation for atrial fibrillation 12/16/2015    Complete bilateral atrial lesion set using bipolar radiofrequency and cryothermy ablation with clipping of LA appendage via right mini thoracotomy approach  . s/p Minimally-invasive resection of papillary  fibroelastoma of heart 12/16/2015  . Pleural effusion on right 12/27/2015    loculated  . Pleural effusion on right   . Sepsis ([place]) 12/2015   Past Surgical History  Procedure Laterality Date  . Anterior cervical decomp/discectomy fusion  04-08-2010    C4 -- C5  . Posterior fusion lumbar spine  09-16-2009    L4 -- L5  . Appendectomy  1984  . Transthoracic echocardiogram  04-03-2012    normal LVF/  ef 55-60%  . Cardiovascular stress test  04-27-2014   dr berry    normal perfusion study/  no ischemia/  ef 61%  . Lasik  1990's  . Bilateral thunb joint arthrodesis  left 2007/   right 2010  . Negative sleep study  04-15-2013  . Cystoscopy N/A 08/04/2014    Procedure: CYSTOSCOPY;  Surgeon: Ardis Hughs, MD;  Location: [facility];  Service: Urology;  Laterality: N/A;  . Pubovaginal sling N/A 08/04/2014    Procedure: BOSTON SCIENTIFIC RETRO-PUBIC MID URETHRAL SLING;  Surgeon: Ardis Hughs, MD;  Location[facility];  Service: Urology;  Laterality: N/A;  . Tee without cardioversion N/A 02/06/2015    Procedure: TRANSESOPHAGEAL ECHOCARDIOGRAM (TEE);  Surgeon: Lelon Perla, MD;  Location: [facility] ENDOSCOPY;  Service: Cardiovascular;  Laterality: N/A;  . Tee without cardioversion N/A 05/14/2015    Procedure: TRANSESOPHAGEAL ECHOCARDIOGRAM (TEE);  Surgeon: Sanda Klein, MD;  Location: [place];  Service: Cardiovascular;  Laterality: N/A;  . [place] Dates  without cardioversion N/A 09/02/2015    Procedure: TRANSESOPHAGEAL ECHOCARDIOGRAM (TEE);  Surgeon: Sueanne Margarita, MD;  Location: [facility] ENDOSCOPY;  Service: Cardiovascular;  Laterality: N/A;  . Tee without cardioversion N/A 09/09/2015    Procedure: TRANSESOPHAGEAL ECHOCARDIOGRAM (TEE);  Surgeon: Lelon Perla, MD;  Location: [facility] ENDOSCOPY;  Service: Cardiovascular;  Laterality: N/A;  . Minimally invasive excision of atrial myxoma Right 12/16/2015    Procedure: MINIMALLY INVASIVE RESECTION OF LEFT ATRIAL MASS;  Surgeon:  Rexene Alberts, MD;  Location: [place];  Service: Open Heart Surgery;  Laterality: Right;  . Minimally invasive maze procedure N/A 12/16/2015    Procedure: MINIMALLY INVASIVE MAZE PROCEDURE;  Surgeon: Rexene Alberts, MD;  Location: [place];  Service: Open Heart Surgery;  Laterality: N/A;  . Tee without cardioversion N/A 12/16/2015    Procedure: TRANSESOPHAGEAL ECHOCARDIOGRAM (TEE);  Surgeon: Rexene Alberts, MD;  Location: [place];  Service: Open Heart Surgery;  Laterality: N/A;  . Clipping of atrial appendage N/A 12/16/2015    Procedure: CLIPPING OF ATRIAL APPENDAGE;  Surgeon: Rexene Alberts, MD;  Location: [place];  Service: Open Heart Surgery;  Laterality: N/A;  . Chest tube insertion Right 12/28/2015    Current Outpatient Prescriptions  Medication Sig Dispense Refill  . apixaban (ELIQUIS) 5 MG TABS tablet Take 1 tablet (5 mg total) by mouth 2 (two) times daily. 180 tablet 3  . cycloSPORINE (RESTASIS) 0.05 % ophthalmic emulsion Place 1 drop into both eyes 2 (two) times daily as needed (for dry eyes).     Marland Kitchen desvenlafaxine (PRISTIQ) 50 MG 24 hr tablet Take 100 mg by mouth daily before breakfast.     . LORazepam (ATIVAN) 0.5 MG tablet Take 0.5 mg by mouth at bedtime.     . metoprolol tartrate (LOPRESSOR) 25 MG tablet Take 0.5 tablets (12.5 mg total) by mouth 2 (two) times daily. 45 tablet 3  . Multiple Vitamin (MULITIVITAMIN WITH MINERALS) TABS Take 1 tablet by mouth daily.    Marland Kitchen NEXIUM 40 MG capsule Take 40 mg by mouth daily as needed (for heartburn).     Marland Kitchen oxyCODONE (OXY IR/ROXICODONE) 5 MG immediate release tablet Take 1-2 tablets (5-10 mg total) by mouth every 4 (four) hours as needed for severe pain. 14 tablet 0  . rizatriptan (MAXALT-MLT) 10 MG disintegrating tablet Take 10 mg by mouth daily as needed for migraine.      No current facility-administered medications for this encounter.    No Known Allergies  Social History   Social History  . Marital Status: Married    Spouse Name: N/A  .  Number of Children: N/A  . Years of Education: N/A   Occupational History  . Disabled    Social History Main Topics  . Smoking status: Former Smoker -- 0.50 packs/day for 20 years    Types: Cigarettes    Quit date: 11/07/2001  . Smokeless tobacco: Never Used  . Alcohol Use: No  . Drug Use: No  . Sexual Activity: Not on file   Other Topics Concern  . Not on file   Social History Narrative   Pt lives with husband in [place].  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 Hypertension    . Heart attack Father 26  . Heart disease Mother   . CAD Father 39  . Arrhythmia Father     ROS- All systems are reviewed and negative  except as per the HPI above  Physical Exam: Filed Vitals:   05/11/16 1114  BP: 118/84  Pulse: 53  Height: 5\' 7"  (1.702 m)  Weight: 177 lb (80.287 kg)    GEN- The patient is well appearing, alert and oriented x 3 today.   Head- normocephalic, atraumatic Eyes-  Sclera clear, conjunctiva pink Ears- hearing intact Oropharynx- clear Neck- supple, no JVP Lymph- no cervical lymphadenopathy Lungs- Clear to ausculation bilaterally, normal work of breathing Heart- Regular rate and rhythm, no murmurs, rubs or gallops, PMI not laterally displaced GI- soft, NT, ND, + BS Extremities- no clubbing, cyanosis, or edema MS- no significant deformity or atrophy Skin- no rash or lesion Psych- euthymic mood, full affect Neuro- strength and sensation are intact  EKG-NSR at 53 bpm, pr int 142 ms, qrs int 73 ms, qtc 433 ms Epic records reviewed, including Dr. Roxy Manns and Gwenlyn Found notes.  Assessment and Plan: 1. 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 Has been afib free, can stop amiodarone Chadsvasc of 1 and anticipate stopping DAOC but will run past Dr. Rayann Heman first and let pt know for sure, she will continue it  until then.  F/u in 3 months   Geroge Baseman. , [facility] [address] [place], [place] [address] [phone]

## 2016-05-11 NOTE — Patient Instructions (Addendum)
Your physician has recommended you make the following change in your medication:  1)Stop Amiodarone    

## 2016-05-13 ENCOUNTER — Ambulatory Visit (HOSPITAL_COMMUNITY): Payer: BLUE CROSS/BLUE SHIELD

## 2016-05-16 ENCOUNTER — Ambulatory Visit (HOSPITAL_COMMUNITY): Payer: BLUE CROSS/BLUE SHIELD

## 2016-05-18 ENCOUNTER — Other Ambulatory Visit (HOSPITAL_COMMUNITY): Payer: Self-pay | Admitting: *Deleted

## 2016-05-18 ENCOUNTER — Ambulatory Visit (HOSPITAL_COMMUNITY): Payer: BLUE CROSS/BLUE SHIELD

## 2016-05-18 DIAGNOSIS — I4891 Unspecified atrial fibrillation: Secondary | ICD-10-CM

## 2016-05-18 MED ORDER — APIXABAN 5 MG PO TABS: 5.0000 mg | ORAL_TABLET | Freq: Two times a day (BID) | ORAL | Status: DC

## 2016-05-20 ENCOUNTER — Ambulatory Visit (HOSPITAL_COMMUNITY): Payer: BLUE CROSS/BLUE SHIELD

## 2016-05-20 ENCOUNTER — Telehealth (HOSPITAL_COMMUNITY): Payer: Self-pay | Admitting: *Deleted

## 2016-05-20 NOTE — Telephone Encounter (Signed)
Per Roderic Palau, NP she consulted with Dr. Roxy Manns and patient is okay to stop blood thinner, but will now need to take ASA 81 mg daily.  I notified pt who understood instruction.

## 2016-05-23 ENCOUNTER — Ambulatory Visit (HOSPITAL_COMMUNITY): Payer: BLUE CROSS/BLUE SHIELD

## 2016-05-25 ENCOUNTER — Ambulatory Visit (HOSPITAL_COMMUNITY): Payer: BLUE CROSS/BLUE SHIELD

## 2016-05-27 ENCOUNTER — Ambulatory Visit (HOSPITAL_COMMUNITY): Payer: BLUE CROSS/BLUE SHIELD

## 2016-05-30 ENCOUNTER — Ambulatory Visit (HOSPITAL_COMMUNITY): Payer: BLUE CROSS/BLUE SHIELD

## 2016-06-01 ENCOUNTER — Ambulatory Visit (HOSPITAL_COMMUNITY): Payer: BLUE CROSS/BLUE SHIELD

## 2016-06-03 ENCOUNTER — Ambulatory Visit (HOSPITAL_COMMUNITY): Payer: BLUE CROSS/BLUE SHIELD

## 2016-06-06 ENCOUNTER — Ambulatory Visit (HOSPITAL_COMMUNITY): Payer: BLUE CROSS/BLUE SHIELD

## 2016-06-08 ENCOUNTER — Ambulatory Visit (HOSPITAL_COMMUNITY): Payer: BLUE CROSS/BLUE SHIELD

## 2016-06-10 ENCOUNTER — Ambulatory Visit (HOSPITAL_COMMUNITY): Payer: BLUE CROSS/BLUE SHIELD

## 2016-06-13 ENCOUNTER — Ambulatory Visit (HOSPITAL_COMMUNITY): Payer: BLUE CROSS/BLUE SHIELD

## 2016-06-15 ENCOUNTER — Ambulatory Visit (HOSPITAL_COMMUNITY): Payer: BLUE CROSS/BLUE SHIELD

## 2016-06-17 ENCOUNTER — Ambulatory Visit (HOSPITAL_COMMUNITY): Payer: BLUE CROSS/BLUE SHIELD

## 2016-06-20 ENCOUNTER — Ambulatory Visit (HOSPITAL_COMMUNITY): Payer: BLUE CROSS/BLUE SHIELD

## 2016-06-22 ENCOUNTER — Ambulatory Visit (HOSPITAL_COMMUNITY): Payer: BLUE CROSS/BLUE SHIELD

## 2016-06-24 ENCOUNTER — Ambulatory Visit (HOSPITAL_COMMUNITY): Payer: BLUE CROSS/BLUE SHIELD

## 2016-06-27 ENCOUNTER — Ambulatory Visit (HOSPITAL_COMMUNITY): Payer: BLUE CROSS/BLUE SHIELD

## 2016-06-29 ENCOUNTER — Ambulatory Visit (HOSPITAL_COMMUNITY): Payer: BLUE CROSS/BLUE SHIELD

## 2016-07-01 ENCOUNTER — Ambulatory Visit (HOSPITAL_COMMUNITY): Payer: BLUE CROSS/BLUE SHIELD

## 2016-07-01 NOTE — Progress Notes (Signed)
DOS 10/06/2015 Endoscopic release med. band right heel

## 2016-07-04 ENCOUNTER — Ambulatory Visit (HOSPITAL_COMMUNITY): Payer: BLUE CROSS/BLUE SHIELD

## 2016-07-06 ENCOUNTER — Ambulatory Visit (HOSPITAL_COMMUNITY): Payer: BLUE CROSS/BLUE SHIELD

## 2016-07-08 ENCOUNTER — Ambulatory Visit (HOSPITAL_COMMUNITY): Payer: BLUE CROSS/BLUE SHIELD

## 2016-07-11 ENCOUNTER — Ambulatory Visit (HOSPITAL_COMMUNITY): Payer: BLUE CROSS/BLUE SHIELD

## 2016-07-13 ENCOUNTER — Ambulatory Visit (HOSPITAL_COMMUNITY): Payer: BLUE CROSS/BLUE SHIELD

## 2016-07-15 ENCOUNTER — Ambulatory Visit (HOSPITAL_COMMUNITY): Payer: BLUE CROSS/BLUE SHIELD

## 2016-07-18 ENCOUNTER — Ambulatory Visit (HOSPITAL_COMMUNITY): Payer: BLUE CROSS/BLUE SHIELD

## 2016-08-24 ENCOUNTER — Ambulatory Visit (HOSPITAL_COMMUNITY)
Admission: RE | Admit: 2016-08-24 | Discharge: 2016-08-24 | Disposition: A | Discharge status: Home / Self Care | Payer: BLUE CROSS/BLUE SHIELD | Source: Ambulatory Visit | Attending: Nurse Practitioner | Admitting: Nurse Practitioner

## 2016-08-24 ENCOUNTER — Encounter (HOSPITAL_COMMUNITY): Payer: Self-pay | Admitting: Nurse Practitioner

## 2016-08-24 VITALS — BP 126/80 | HR 57 | Ht 67.0 in | Wt 186.6 lb

## 2016-08-24 DIAGNOSIS — Z87891 Personal history of nicotine dependence: Secondary | ICD-10-CM | POA: Diagnosis not present

## 2016-08-24 DIAGNOSIS — R5383 Other fatigue: Secondary | ICD-10-CM | POA: Insufficient documentation

## 2016-08-24 DIAGNOSIS — Z8249 Family history of ischemic heart disease and other diseases of the circulatory system: Secondary | ICD-10-CM | POA: Insufficient documentation

## 2016-08-24 DIAGNOSIS — I480 Paroxysmal atrial fibrillation: Secondary | ICD-10-CM | POA: Insufficient documentation

## 2016-08-24 DIAGNOSIS — M545 Low back pain: Secondary | ICD-10-CM | POA: Insufficient documentation

## 2016-08-24 DIAGNOSIS — I10 Essential (primary) hypertension: Secondary | ICD-10-CM | POA: Insufficient documentation

## 2016-08-24 DIAGNOSIS — K219 Gastro-esophageal reflux disease without esophagitis: Secondary | ICD-10-CM | POA: Diagnosis not present

## 2016-08-24 DIAGNOSIS — Z79899 Other long term (current) drug therapy: Secondary | ICD-10-CM | POA: Diagnosis not present

## 2016-08-24 DIAGNOSIS — G8929 Other chronic pain: Secondary | ICD-10-CM | POA: Diagnosis not present

## 2016-08-24 DIAGNOSIS — F329 Major depressive disorder, single episode, unspecified: Secondary | ICD-10-CM | POA: Diagnosis not present

## 2016-08-24 NOTE — Progress Notes (Signed)
Patient ID: YUDIT DEALBA, female   DOB: [birth year], 55 y.o.   MRN: [id number]     Primary Care Physician: Tula Nakayama Referring Physician: Dr. Gwenlyn Found Cardiologist: Dr. Gwenlyn Found CV surgeon: Dr. Aloha Gell is a 55 y.o. 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 No palpitations to suggest reoccurrence of afib. She saw Dr. Gwenlyn Found recently saw pt and deferred medicine changes to this office. She has been on 100 mg of amiodarone since early May without palpitations and can come off drug. Chadsvasc score of one that I can determine for female. Stopping DOAC was reviewed with Dr. Roxy Manns and Dr. Rayann Heman and both agreed was OK to stop DOAC.   She returns to afib clinic today and is doing well. No further palpitations, maintaining SR. Is walking on a regular basis and does not describe any shortness of breath but feels some fatigue. Question if BB is causing fatigue.  Today, she denies symptoms of palpitations, chest pain, shortness of breath, orthopnea, PND, lower extremity edema, dizziness, presyncope, syncope, or neurologic sequela. The patient is tolerating medications without difficulties and is otherwise without complaint today.   Past Medical History:  Diagnosis Date  . Anxiety   . Arthritis    low back, degenerative spine   . Chronic lower back pain   . Depression   . Dysrhythmia 02/2015   afib  . GERD (gastroesophageal reflux disease)   . H/O hiatal hernia   . Heart murmur   . History of kidney stones   . Hypertension   . Migraines    h/o migraines   . Paroxysmal atrial fibrillation (HCC)   . Plantar fasciitis   . Pleural effusion on right 12/27/2015   loculated  . Pleural effusion on right   . S/P Minimally-invasive maze operation for atrial fibrillation 12/16/2015   Complete bilateral atrial lesion set using bipolar radiofrequency and cryothermy ablation with clipping of LA  appendage via right mini thoracotomy approach  . 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 Sepsis ([place]) 12/2015  . Spinal stenosis   . SUI (stress urinary incontinence, female)   . 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 TMJ (temporomandibular joint syndrome)    LEFT SIDE--  WEAR  MOUTH GUARD  . Typical atrial flutter [facility])    Past Surgical History:  Procedure Laterality Date  . ANTERIOR CERVICAL DECOMP/DISCECTOMY FUSION  04-08-2010   C4 -- C5  . APPENDECTOMY  1984  . BILATERAL THUNB JOINT ARTHRODESIS  left 2007/   right 2010  . CARDIOVASCULAR STRESS TEST  04-27-2014   dr berry   normal perfusion study/  no ischemia/  ef 61%  . CHEST TUBE INSERTION Right 12/28/2015  . CLIPPING OF ATRIAL APPENDAGE N/A 12/16/2015   Procedure: CLIPPING OF ATRIAL APPENDAGE;  Surgeon: Rexene Alberts, MD;  Location: [place];  Service: Open Heart Surgery;  Laterality: N/A;  . CYSTOSCOPY N/A 08/04/2014   Procedure: CYSTOSCOPY;  Surgeon: Ardis Hughs, MD;  Location: [facility];  Service: Urology;  Laterality: N/A;  . LASIK  1990's  . MINIMALLY INVASIVE EXCISION OF ATRIAL MYXOMA Right 12/16/2015   Procedure: MINIMALLY INVASIVE RESECTION OF LEFT ATRIAL MASS;  Surgeon: Rexene Alberts, MD;  Location: [place];  Service: Open Heart Surgery;  Laterality: Right;  . MINIMALLY INVASIVE MAZE PROCEDURE N/A 12/16/2015   Procedure: MINIMALLY INVASIVE MAZE PROCEDURE;  Surgeon: Braulio Conte  Keturah Barre, MD;  Location: [place];  Service: Open Heart Surgery;  Laterality: N/A;  . NEGATIVE SLEEP STUDY  04-15-2013  . POSTERIOR FUSION LUMBAR SPINE  09-16-2009   L4 -- L5  . PUBOVAGINAL SLING N/A 08/04/2014   Procedure: BOSTON SCIENTIFIC RETRO-PUBIC MID URETHRAL SLING;  Surgeon: Ardis Hughs, MD;  Location: [facility];  Service: Urology;  Laterality: N/A;  . TEE WITHOUT CARDIOVERSION N/A 02/06/2015   Procedure: TRANSESOPHAGEAL ECHOCARDIOGRAM (TEE);  Surgeon: Lelon Perla, MD;  Location: [facility] ENDOSCOPY;  Service: Cardiovascular;  Laterality: N/A;  . TEE WITHOUT CARDIOVERSION N/A 05/14/2015   Procedure: TRANSESOPHAGEAL ECHOCARDIOGRAM (TEE);  Surgeon: Sanda Klein, MD;  Location: [facility] ENDOSCOPY;  Service: Cardiovascular;  Laterality: N/A;  . TEE WITHOUT CARDIOVERSION N/A 09/02/2015   Procedure: TRANSESOPHAGEAL ECHOCARDIOGRAM (TEE);  Surgeon: Sueanne Margarita, MD;  Location: [facility] ENDOSCOPY;  Service: Cardiovascular;  Laterality: N/A;  . TEE WITHOUT CARDIOVERSION N/A 09/09/2015   Procedure: TRANSESOPHAGEAL ECHOCARDIOGRAM (TEE);  Surgeon: Lelon Perla, MD;  Location: [facility] ENDOSCOPY;  Service: Cardiovascular;  Laterality: N/A;  . TEE WITHOUT CARDIOVERSION N/A 12/16/2015   Procedure: TRANSESOPHAGEAL ECHOCARDIOGRAM (TEE);  Surgeon: Rexene Alberts, MD;  Location: [place];  Service: Open Heart Surgery;  Laterality: N/A;  . TRANSTHORACIC ECHOCARDIOGRAM  04-03-2012   normal LVF/  ef 55-60%    Current Outpatient Prescriptions  Medication Sig Dispense Refill  . cycloSPORINE (RESTASIS) 0.05 % ophthalmic emulsion Place 1 drop into both eyes 2 (two) times daily as needed (for dry eyes).     Marland Kitchen desvenlafaxine (PRISTIQ) 50 MG 24 hr tablet Take 100 mg by mouth daily before breakfast.     . HYDROcodone-acetaminophen (NORCO) 10-325 MG tablet Take 1 tablet by mouth every 6 (six) hours as needed (1 tablet every 4 to 6 hours PRN).    . LORazepam (ATIVAN) 0.5 MG tablet Take 0.5 mg by mouth at bedtime.     . metoprolol tartrate (LOPRESSOR) 25 MG tablet Take 0.5 tablets (12.5 mg total) by mouth 2 (two) times daily. 45 tablet 3  . Multiple Vitamin (MULITIVITAMIN WITH MINERALS) TABS Take 1 tablet by mouth daily.    Marland Kitchen NEXIUM 40 MG capsule Take 40 mg by mouth daily as needed (for heartburn).     Marland Kitchen oxyCODONE (OXY IR/ROXICODONE) 5 MG immediate release tablet Take 1-2 tablets (5-10 mg total) by mouth every 4 (four) hours as needed for severe pain. 14 tablet 0  . rizatriptan (MAXALT-MLT) 10 MG  disintegrating tablet Take 10 mg by mouth daily as needed for migraine.      No current facility-administered medications for this encounter.     No Known Allergies  Social History   Social History  . Marital status: Married    Spouse name: N/A  . Number of children: N/A  . Years of education: N/A   Occupational History  . Disabled    Social History Main Topics  . Smoking status: Former Smoker    Packs/day: 0.50    Years: 20.00    Types: Cigarettes    Quit date: 11/07/2001  . Smokeless tobacco: Never Used  . Alcohol use No  . Drug use: No  . Sexual activity: Not on file   Other Topics Concern  . Not on file   Social History Narrative   Pt lives with husband in [place].  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 Hypertension    .  Heart attack Father 36  . Heart disease Mother   . CAD Father 46  . Arrhythmia Father     ROS- All systems are reviewed and negative except as per the HPI above  Physical Exam: Vitals:   08/24/16 0934  BP: 126/80  Pulse: (!) 57  Weight: 186 lb 9.6 oz (84.6 kg)  Height: 5\' 7"  (1.702 m)    GEN- The patient is well appearing, alert and oriented x 3 today.   Head- normocephalic, atraumatic Eyes-  Sclera clear, conjunctiva pink Ears- hearing intact Oropharynx- clear Neck- supple, no JVP Lymph- no cervical lymphadenopathy Lungs- Clear to ausculation bilaterally, normal work of breathing Heart- Regular rate and rhythm, no murmurs, rubs or gallops, PMI not laterally displaced GI- soft, NT, ND, + BS Extremities- no clubbing, cyanosis, or edema MS- no significant deformity or atrophy Skin- no rash or lesion Psych- euthymic mood, full affect Neuro- strength and sensation are intact  EKG Sinus brady at 57 bpm, pr int 156 ms, qrs int 74 ms, qtc 436 ms Epic records reviewed  Assessment and Plan: 1. S/p minimally invasive resection of  left atrial papillary fibro-elastoma and Maze procedure on 12/16/2015. Has been afib free, off amiodarone and DOAC Chadsvasc of 1 for female  2. Fatigue Possibly from BB Will repeat echo and if no LV dysfunction will stop BB   F/u with Dr. Roxy Manns in February and afib clinic in one year for long term surveillance of afib/maze procedure   Butch Penny C. , [facility] [address] [place], [place] [address] [phone]

## 2016-09-09 ENCOUNTER — Ambulatory Visit (HOSPITAL_COMMUNITY)
Admission: RE | Admit: 2016-09-09 | Discharge: 2016-09-09 | Disposition: A | Discharge status: Home / Self Care | Payer: BLUE CROSS/BLUE SHIELD | Source: Ambulatory Visit | Attending: Nurse Practitioner | Admitting: Nurse Practitioner

## 2016-09-09 DIAGNOSIS — I480 Paroxysmal atrial fibrillation: Secondary | ICD-10-CM | POA: Insufficient documentation

## 2016-09-09 DIAGNOSIS — R011 Cardiac murmur, unspecified: Secondary | ICD-10-CM | POA: Diagnosis not present

## 2016-09-09 NOTE — Progress Notes (Signed)
  Echocardiogram 2D Echocardiogram has been performed.  Miranda Taylor 09/09/2016, 9:33 AM

## 2016-09-20 ENCOUNTER — Other Ambulatory Visit (HOSPITAL_COMMUNITY): Payer: Self-pay | Admitting: *Deleted

## 2016-12-05 ENCOUNTER — Other Ambulatory Visit: Payer: Self-pay | Admitting: Cardiovascular Disease

## 2016-12-05 DIAGNOSIS — I480 Paroxysmal atrial fibrillation: Secondary | ICD-10-CM

## 2016-12-19 ENCOUNTER — Encounter: Payer: BLUE CROSS/BLUE SHIELD | Admitting: Thoracic Surgery (Cardiothoracic Vascular Surgery)

## 2016-12-26 ENCOUNTER — Ambulatory Visit (INDEPENDENT_AMBULATORY_CARE_PROVIDER_SITE_OTHER): Payer: BLUE CROSS/BLUE SHIELD | Admitting: Thoracic Surgery (Cardiothoracic Vascular Surgery)

## 2016-12-26 ENCOUNTER — Encounter: Payer: Self-pay | Admitting: Thoracic Surgery (Cardiothoracic Vascular Surgery)

## 2016-12-26 VITALS — BP 111/76 | HR 76 | Resp 20 | Ht 67.0 in | Wt 186.0 lb

## 2016-12-26 DIAGNOSIS — Z8679 Personal history of other diseases of the circulatory system: Secondary | ICD-10-CM

## 2016-12-26 DIAGNOSIS — Z9889 Other specified postprocedural states: Secondary | ICD-10-CM | POA: Diagnosis not present

## 2016-12-26 DIAGNOSIS — D151 Benign neoplasm of heart: Secondary | ICD-10-CM | POA: Diagnosis not present

## 2016-12-26 NOTE — Progress Notes (Signed)
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 She was last seen here in our office on 03/14/2016 at which time she was doing well and maintaining sinus rhythm. Since then she has continued to maintain sinus rhythm. She has been seen in follow-up by Dr. Gwenlyn Found and more recently by Roderic Palau in the atrial fibrillation clinic. Follow-up echocardiogram performed last November revealed normal left ventricular systolic function. She has been taken off of amiodarone, metoprolol, and all blood thinners. She returns to our office for routine follow-up today. She reports that she is doing very well. She states that she feels much improved in comparison with how she was prior to surgery. She has not had any palpitations or other symptoms to suggest a recurrence of atrial fibrillation. She states that her energy level has improved since she has been taken off of her previous medications. She states that she still gets short of breath with physical activity, but this occurs only with more strenuous exertion and it only slightly limits her ability. Overall she states that her exercise tolerance is noticeably better than it was prior to surgery one year ago. She denies any history of chest pain or chest tightness either with activity or at rest. The remainder of her review of systems is unremarkable.   Current Outpatient Prescriptions  Medication Sig Dispense Refill  . cycloSPORINE (RESTASIS) 0.05 % ophthalmic emulsion Place 1  drop into both eyes 2 (two) times daily as needed (for dry eyes).     Marland Kitchen desvenlafaxine (PRISTIQ) 50 MG 24 hr tablet Take 100 mg by mouth daily before breakfast.     . LORazepam (ATIVAN) 0.5 MG tablet Take 0.5 mg by mouth at bedtime.     . Multiple Vitamin (MULITIVITAMIN WITH MINERALS) TABS Take 1 tablet by mouth daily.    Marland Kitchen NEXIUM 40 MG capsule Take 40 mg by mouth daily as needed (for heartburn).     Marland Kitchen oxyCODONE (OXY IR/ROXICODONE) 5 MG immediate release tablet Take 1-2 tablets (5-10 mg total) by mouth every 4 (four) hours as needed for severe pain. 14 tablet 0  . rizatriptan (MAXALT-MLT) 10 MG disintegrating tablet Take 10 mg by mouth daily as needed for migraine.      No current facility-administered medications for this visit.       Physical Exam:   BP 111/76   Pulse 76   Resp 20   Ht 5\' 7"  (1.702 m)   Wt 186 lb (84.4 kg)   LMP 12/25/2012   SpO2 99% Comment: RA  BMI 29.13 kg/m   General:  Well-appearing  Chest:   Clear to auscultation  CV:   Regular rate and rhythm without murmur  Incisions:  Completely healed  Abdomen:  Soft nontender  Extremities:  Warm and well-perfused, no edema  Diagnostic Tests:  2 channel telemetry rhythm strip demonstrates normal sinus rhythm   Transthoracic Echocardiography  Patient:    Miranda Taylor, Miranda Taylor MR #:  [id number] Study Date: 09/09/2016 Gender:     F Age:        68 Height:     170.2 cm Weight:     84.6 kg BSA:        2.02 m^2 Pt. Status: Room:   ATTENDING    Marvell Fuller  REFERRING    Carroll, [place], Outpatient  SONOGRAPHER  Madelin Rear, RDCS  cc:  ------------------------------------------------------------------- LV EF: 55% -   60%  ------------------------------------------------------------------- Indications:      Atrial fibrillation - 427.31.  ------------------------------------------------------------------- History:   PMH:  HX: LAA thrombus and  Maze procedure.  Murmur. Atrial flutter.  ------------------------------------------------------------------- Study Conclusions  - Left ventricle: The cavity size was normal. Systolic function was   normal. The estimated ejection fraction was in the range of 55%   to 60%. Wall motion was normal; there were no regional wall   motion abnormalities. There was a reduced contribution of atrial   contraction to ventricular filling, due to increased ventricular   diastolic pressure or atrial contractile dysfunction. Features   are consistent with a pseudonormal left ventricular filling   pattern, with concomitant abnormal relaxation and increased   filling pressure (grade 2 diastolic dysfunction). - Tricuspid valve: There was mild-moderate regurgitation directed   centrally. - Pulmonary arteries: Systolic pressure was mildly increased. PA   peak pressure: 37 mm Hg (S).  ------------------------------------------------------------------- Study data:  Comparison was made to the study of 12/27/2015.  Study status:  Routine.  Procedure:  The patient reported no pain pre or post test. Transthoracic echocardiography. Image quality was adequate.  Study completion:  There were no complications. Transthoracic echocardiography.  M-mode, complete 2D, spectral Doppler, and color Doppler.  Birthdate:  Patient birthdate: 02/25/1961.  Age:  Patient is 56 yr old.  Sex:  Gender: female. BMI: 29.2 kg/m^2.  Blood pressure:     126/80  Patient status: Outpatient.  Study date:  Study date: 09/09/2016. Study time: 08:59 AM.  Location:  [facility].  -------------------------------------------------------------------  ------------------------------------------------------------------- Left ventricle:  The cavity size was normal. Systolic function was normal. The estimated ejection fraction was in the range of 55% to 60%. Wall motion was normal; there were no regional wall motion abnormalities. There  was a reduced contribution of atrial contraction to ventricular filling, due to increased ventricular diastolic pressure or atrial contractile dysfunction. Features are consistent with a pseudonormal left ventricular filling pattern, with concomitant abnormal relaxation and increased filling pressure (grade 2 diastolic dysfunction).  ------------------------------------------------------------------- Aortic valve:   Trileaflet; normal thickness leaflets. Mobility was not restricted.  Doppler:  Transvalvular velocity was within the normal range. There was no stenosis. There was no regurgitation.   ------------------------------------------------------------------- Aorta:  Aortic root: The aortic root was normal in size.  ------------------------------------------------------------------- Mitral valve:   Structurally normal valve.   Mobility was not restricted.  Doppler:  Transvalvular velocity was within the normal range. There was no evidence for stenosis. There was no regurgitation.    Peak gradient (D): 4 mm Hg.  ------------------------------------------------------------------- Left atrium:  The atrium was normal in size.  ------------------------------------------------------------------- Right ventricle:  The cavity size was normal. Wall thickness was normal. Systolic function was normal.  ------------------------------------------------------------------- Pulmonic valve:    Doppler:  Transvalvular velocity was within the normal range. There was no evidence for stenosis.  ------------------------------------------------------------------- Tricuspid valve:   Structurally normal valve.    Doppler: Transvalvular velocity was within the normal  range. There was mild-moderate regurgitation directed centrally.  ------------------------------------------------------------------- Pulmonary artery:   The main pulmonary artery was normal-sized. Systolic pressure was mildly  increased.  ------------------------------------------------------------------- Right atrium:  The atrium was normal in size.  ------------------------------------------------------------------- Pericardium:  There was no pericardial effusion.  ------------------------------------------------------------------- Systemic veins: Inferior vena cava: The vessel was normal in size. The respirophasic diameter changes were in the normal range (>= 50%), consistent with normal central venous pressure.  ------------------------------------------------------------------- Measurements   Left ventricle                          Value        Reference  LV ID, ED, PLAX chordal                 47.2  mm     43 - 52  LV ID, ES, PLAX chordal                 27.5  mm     23 - 38  LV fx shortening, PLAX chordal          42    %      >=29  LV PW thickness, ED                     9.3   mm     ---------  IVS/LV PW ratio, ED                     1.03         <=1.3  Stroke volume, 2D                       91    ml     ---------  Stroke volume/bsa, 2D                   45    ml/m^2 ---------  LV ejection fraction, 1-p A4C           66    %      ---------  LV end-diastolic volume, 2-p            80    ml     ---------  LV end-systolic volume, 2-p             29    ml     ---------  LV ejection fraction, 2-p               64    %      ---------  Stroke volume, 2-p                      51    ml     ---------  LV end-diastolic volume/bsa, 2-p        40    ml/m^2 ---------  LV end-systolic volume/bsa, 2-p         14    ml/m^2 ---------  Stroke volume/bsa, 2-p                  25.4  ml/m^2 ---------  LV e&', lateral                          9.25  cm/s   ---------  LV E/e&', lateral  11.35        ---------  LV e&', medial                           6.31  cm/s   ---------  LV E/e&', medial                         16.64        ---------  LV e&', average                          7.78  cm/s    ---------  LV E/e&', average                        13.5         ---------  Longitudinal strain, TDI                21    %      ---------    Ventricular septum                      Value        Reference  IVS thickness, ED                       9.61  mm     ---------    LVOT                                    Value        Reference  LVOT ID, S                              20    mm     ---------  LVOT area                               3.14  cm^2   ---------  LVOT peak velocity, S                   135   cm/s   ---------  LVOT mean velocity, S                   86    cm/s   ---------  LVOT VTI, S                             29    cm     ---------  LVOT peak gradient, S                   7     mm Hg  ---------    Aorta                                   Value        Reference  Aortic root ID, ED                      33    mm     ---------  Left atrium                             Value        Reference  LA ID, A-P, ES                          34    mm     ---------  LA ID/bsa, A-P                          1.68  cm/m^2 <=2.2  LA volume, S                            33.9  ml     ---------  LA volume/bsa, S                        16.8  ml/m^2 ---------  LA volume, ES, 1-p A4C                  30.3  ml     ---------  LA volume/bsa, ES, 1-p A4C              15    ml/m^2 ---------  LA volume, ES, 1-p A2C                  36.1  ml     ---------  LA volume/bsa, ES, 1-p A2C              17.8  ml/m^2 ---------    Mitral valve                            Value        Reference  Mitral E-wave peak velocity             105   cm/s   ---------  Mitral deceleration time         (H)    271   ms     150 - 230  Mitral peak gradient, D                 4     mm Hg  ---------    Pulmonary arteries                      Value        Reference  PA pressure, S, DP               (H)    37    mm Hg  <=30    Tricuspid valve                         Value        Reference  Tricuspid regurg peak velocity           291   cm/s   ---------  Tricuspid peak RV-RA gradient           34    mm Hg  ---------    Systemic veins                          Value  Reference  Estimated CVP                           3     mm Hg  ---------    Right ventricle                         Value        Reference  TAPSE                                   19    mm     ---------  RV pressure, S, DP               (H)    37    mm Hg  <=30  RV s&', lateral, S                       13.2  cm/s   ---------  Legend: (L)  and  (H)  mark values outside specified reference range.  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  Plan:  We have not recommended any changes to the patient's current medications. The patient's CHADSVASC score and predicted risk of stroke is low.  Moreover, a clip was placed on the left atrial appendage at the time of surgery. Under the circumstances I agree with previous decision to stop oral anticoagulation. I discussed with the patient the possibility that she could have a recurrence of atrial fibrillation or atrial flutter at some point in the future, particularly as she gets older.  We discussed whether or not to consider Holter monitor or implantable loop recorder to screen for the presence of subclinical atrial dysrhythmias. In the absence of symptoms I do not feel that such diagnostic testing is necessary.  The patient will continue to follow-up intermittently with Dr. Gwenlyn Found. In addition, she will be followed on an annual basis in the atrial fibrillation clinic for long-term surveillance. She will call and return to see [place] in our office in the future only should further problems or questions  arise.  I spent in excess of 15 minutes during the conduct of this office consultation and >50% of this time involved direct face-to-face encounter with the patient for counseling and/or coordination of their care.    Valentina Gu. Roxy Manns, MD 12/26/2016 10:04 AM

## 2016-12-26 NOTE — Patient Instructions (Signed)
Continue all previous medications without any changes at this time  

## 2017-12-14 ENCOUNTER — Encounter (HOSPITAL_COMMUNITY): Payer: Self-pay | Admitting: Nurse Practitioner

## 2017-12-14 ENCOUNTER — Ambulatory Visit (HOSPITAL_COMMUNITY)
Admission: RE | Admit: 2017-12-14 | Discharge: 2017-12-14 | Disposition: A | Discharge status: Home / Self Care | Payer: BLUE CROSS/BLUE SHIELD | Source: Ambulatory Visit | Attending: Nurse Practitioner | Admitting: Nurse Practitioner

## 2017-12-14 VITALS — BP 112/76 | HR 81 | Ht 67.0 in | Wt 171.4 lb

## 2017-12-14 DIAGNOSIS — I480 Paroxysmal atrial fibrillation: Secondary | ICD-10-CM

## 2017-12-14 DIAGNOSIS — Z87442 Personal history of urinary calculi: Secondary | ICD-10-CM | POA: Diagnosis not present

## 2017-12-14 DIAGNOSIS — Z981 Arthrodesis status: Secondary | ICD-10-CM | POA: Diagnosis not present

## 2017-12-14 DIAGNOSIS — Z9889 Other specified postprocedural states: Secondary | ICD-10-CM | POA: Insufficient documentation

## 2017-12-14 DIAGNOSIS — I483 Typical atrial flutter: Secondary | ICD-10-CM | POA: Insufficient documentation

## 2017-12-14 DIAGNOSIS — F419 Anxiety disorder, unspecified: Secondary | ICD-10-CM | POA: Diagnosis not present

## 2017-12-14 DIAGNOSIS — F329 Major depressive disorder, single episode, unspecified: Secondary | ICD-10-CM | POA: Insufficient documentation

## 2017-12-14 DIAGNOSIS — Z09 Encounter for follow-up examination after completed treatment for conditions other than malignant neoplasm: Secondary | ICD-10-CM | POA: Diagnosis present

## 2017-12-14 DIAGNOSIS — K219 Gastro-esophageal reflux disease without esophagitis: Secondary | ICD-10-CM | POA: Insufficient documentation

## 2017-12-14 DIAGNOSIS — Z87891 Personal history of nicotine dependence: Secondary | ICD-10-CM | POA: Diagnosis not present

## 2017-12-14 DIAGNOSIS — K449 Diaphragmatic hernia without obstruction or gangrene: Secondary | ICD-10-CM | POA: Insufficient documentation

## 2017-12-14 DIAGNOSIS — Z8249 Family history of ischemic heart disease and other diseases of the circulatory system: Secondary | ICD-10-CM | POA: Diagnosis not present

## 2017-12-14 DIAGNOSIS — Z79899 Other long term (current) drug therapy: Secondary | ICD-10-CM | POA: Insufficient documentation

## 2017-12-14 DIAGNOSIS — J90 Pleural effusion, not elsewhere classified: Secondary | ICD-10-CM | POA: Insufficient documentation

## 2017-12-14 DIAGNOSIS — I10 Essential (primary) hypertension: Secondary | ICD-10-CM | POA: Diagnosis not present

## 2017-12-14 NOTE — Progress Notes (Signed)
Patient ID: Miranda Taylor, female   DOB: [birth year], 57 y.o.   MRN: [id number]     Primary Care Physician: Aletha Halim., PA-C Referring Physician: Dr. Gwenlyn Found Cardiologist: Dr. Gwenlyn Found CV surgeon: Dr. Aloha Gell is a 57 y.o. 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 No palpitations to suggest reoccurrence of afib.  She is off anticoagulation and cardiac meds for some time. She feels well. No heart irregularity or shortness of breath.BB stopped for fatigue on last visit here with improvement in her energy.  Today, she denies symptoms of palpitations, chest pain, shortness of breath, orthopnea, PND, lower extremity edema, dizziness, presyncope, syncope, or neurologic sequela. The patient is tolerating medications without difficulties and is otherwise without complaint today.   Past Medical History:  Diagnosis Date  . Anxiety   . Arthritis    low back, degenerative spine   . Chronic lower back pain   . Depression   . Dysrhythmia 02/2015   afib  . GERD (gastroesophageal reflux disease)   . H/O hiatal hernia   . Heart murmur   . History of kidney stones   . Hypertension   . Migraines    h/o migraines   . Paroxysmal atrial fibrillation (HCC)   . Plantar fasciitis   . Pleural effusion on right 12/27/2015   loculated  . Pleural effusion on right   . S/P Minimally-invasive maze operation for atrial fibrillation 12/16/2015   Complete bilateral atrial lesion set using bipolar radiofrequency and cryothermy ablation with clipping of LA appendage via right mini thoracotomy approach  . 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 Sepsis ([place]) 12/2015  . Spinal stenosis   . SUI (stress urinary incontinence, female)   . 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 TMJ (temporomandibular joint syndrome)    LEFT SIDE--  WEAR  MOUTH GUARD  . Typical atrial  flutter [facility])    Past Surgical History:  Procedure Laterality Date  . ANTERIOR CERVICAL DECOMP/DISCECTOMY FUSION  04-08-2010   C4 -- C5  . APPENDECTOMY  1984  . BILATERAL THUNB JOINT ARTHRODESIS  left 2007/   right 2010  . CARDIOVASCULAR STRESS TEST  04-27-2014   dr berry   normal perfusion study/  no ischemia/  ef 61%  . CHEST TUBE INSERTION Right 12/28/2015  . CLIPPING OF ATRIAL APPENDAGE N/A 12/16/2015   Procedure: CLIPPING OF ATRIAL APPENDAGE;  Surgeon: Rexene Alberts, MD;  Location: [place];  Service: Open Heart Surgery;  Laterality: N/A;  . CYSTOSCOPY N/A 08/04/2014   Procedure: CYSTOSCOPY;  Surgeon: Ardis Hughs, MD;  Location: [facility];  Service: Urology;  Laterality: N/A;  . LASIK  1990's  . MINIMALLY INVASIVE EXCISION OF ATRIAL MYXOMA Right 12/16/2015   Procedure: MINIMALLY INVASIVE RESECTION OF LEFT ATRIAL MASS;  Surgeon: Rexene Alberts, MD;  Location: [place];  Service: Open Heart Surgery;  Laterality: Right;  . MINIMALLY INVASIVE MAZE PROCEDURE N/A 12/16/2015   Procedure: MINIMALLY INVASIVE MAZE PROCEDURE;  Surgeon: Rexene Alberts, MD;  Location: [place];  Service: Open Heart Surgery;  Laterality: N/A;  . NEGATIVE SLEEP STUDY  04-15-2013  . POSTERIOR FUSION LUMBAR SPINE  09-16-2009   L4 -- L5  . PUBOVAGINAL SLING N/A 08/04/2014   Procedure: BOSTON SCIENTIFIC RETRO-PUBIC MID URETHRAL SLING;  Surgeon: Ardis Hughs, MD;  Location: [facility];  Service: Urology;  Laterality: N/A;  . TEE WITHOUT CARDIOVERSION N/A 02/06/2015   Procedure: TRANSESOPHAGEAL ECHOCARDIOGRAM (TEE);  Surgeon: Lelon Perla, MD;  Location[facility];  Service: Cardiovascular;  Laterality: N/A;  . TEE WITHOUT CARDIOVERSION N/A 05/14/2015   Procedure: TRANSESOPHAGEAL ECHOCARDIOGRAM (TEE);  Surgeon: Sanda Klein, MD;  Location: [facility] ENDOSCOPY;  Service: Cardiovascular;  Laterality: N/A;  . TEE WITHOUT CARDIOVERSION N/A 09/02/2015   Procedure: TRANSESOPHAGEAL ECHOCARDIOGRAM  (TEE);  Surgeon: Sueanne Margarita, MD;  Location[facility] ENDOSCOPY;  Service: Cardiovascular;  Laterality: N/A;  . TEE WITHOUT CARDIOVERSION N/A 09/09/2015   Procedure: TRANSESOPHAGEAL ECHOCARDIOGRAM (TEE);  Surgeon: Lelon Perla, MD;  Location: [facility] ENDOSCOPY;  Service: Cardiovascular;  Laterality: N/A;  . TEE WITHOUT CARDIOVERSION N/A 12/16/2015   Procedure: TRANSESOPHAGEAL ECHOCARDIOGRAM (TEE);  Surgeon: Rexene Alberts, MD;  Location: [place];  Service: Open Heart Surgery;  Laterality: N/A;  . TRANSTHORACIC ECHOCARDIOGRAM  04-03-2012   normal LVF/  ef 55-60%    Current Outpatient Medications  Medication Sig Dispense Refill  . cycloSPORINE (RESTASIS) 0.05 % ophthalmic emulsion Place 1 drop into both eyes 2 (two) times daily as needed (for dry eyes).     Marland Kitchen desvenlafaxine (PRISTIQ) 50 MG 24 hr tablet Take 100 mg by mouth daily before breakfast.     . LORazepam (ATIVAN) 0.5 MG tablet Take 0.5 mg by mouth at bedtime.     . Multiple Vitamin (MULITIVITAMIN WITH MINERALS) TABS Take 1 tablet by mouth daily.    Marland Kitchen NEXIUM 40 MG capsule Take 40 mg by mouth daily as needed (for heartburn).     Marland Kitchen oxyCODONE (OXY IR/ROXICODONE) 5 MG immediate release tablet Take 1-2 tablets (5-10 mg total) by mouth every 4 (four) hours as needed for severe pain. 14 tablet 0  . rizatriptan (MAXALT-MLT) 10 MG disintegrating tablet Take 10 mg by mouth daily as needed for migraine.      No current facility-administered medications for this encounter.     No Known Allergies  Social History   Socioeconomic History  . Marital status: Married    Spouse name: Not on file  . Number of children: Not on file  . Years of education: Not on file  . Highest education level: Not on file  Social Needs  . Financial resource strain: Not on file  . Food insecurity - worry: Not on file  . Food insecurity - inability: Not on file  . Transportation needs - medical: Not on file  . Transportation needs - non-medical: Not on file  Occupational  History  . Occupation: Disabled  Tobacco Use  . Smoking status: Former Smoker    Packs/day: 0.50    Years: 20.00    Pack years: 10.00    Types: Cigarettes    Last attempt to quit: 11/07/2001    Years since quitting: 16.1  . Smokeless tobacco: Never Used  Substance and Sexual Activity  . Alcohol use: No  . Drug use: No  . Sexual activity: Not on file  Other Topics Concern  . Not on file  Social History Narrative   Pt lives with husband in [place].  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 Hypertension Unknown   . Heart attack Father 46  . Heart disease Mother   . CAD Father 9  . Arrhythmia Father     ROS- All systems are reviewed and negative except as per the HPI above  Physical  Exam: Vitals:   12/14/17 1052  BP: 112/76  Pulse: 81  Weight: 171 lb 6.4 oz (77.7 kg)  Height: 5\' 7"  (1.702 m)    GEN- The patient is well appearing, alert and oriented x 3 today.   Head- normocephalic, atraumatic Eyes-  Sclera clear, conjunctiva pink Ears- hearing intact Oropharynx- clear Neck- supple, no JVP Lymph- no cervical lymphadenopathy Lungs- Clear to ausculation bilaterally, normal work of breathing Heart- Regular rate and rhythm, no murmurs, rubs or gallops, PMI not laterally displaced GI- soft, NT, ND, + BS Extremities- no clubbing, cyanosis, or edema MS- no significant deformity or atrophy Skin- no rash or lesion Psych- euthymic mood, full affect Neuro- strength and sensation are intact  EKG Sinus brady at 57 bpm, pr int 156 ms, qrs int 74 ms, qtc 436 ms Epic records reviewed  Assessment and Plan: 1. 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 ,  [facility] [address] [place], [place] [address] [phone]

## 2018-04-20 ENCOUNTER — Encounter: Payer: Self-pay | Admitting: Cardiovascular Disease

## 2018-04-20 ENCOUNTER — Ambulatory Visit (INDEPENDENT_AMBULATORY_CARE_PROVIDER_SITE_OTHER): Payer: BLUE CROSS/BLUE SHIELD | Admitting: Cardiovascular Disease

## 2018-04-20 VITALS — BP 130/80 | HR 85 | Ht 67.0 in | Wt 179.0 lb

## 2018-04-20 DIAGNOSIS — I480 Paroxysmal atrial fibrillation: Secondary | ICD-10-CM

## 2018-04-20 DIAGNOSIS — Z8679 Personal history of other diseases of the circulatory system: Secondary | ICD-10-CM

## 2018-04-20 DIAGNOSIS — Z9889 Other specified postprocedural states: Secondary | ICD-10-CM | POA: Diagnosis not present

## 2018-04-20 NOTE — Patient Instructions (Signed)
Medication Instructions: Your physician recommends that you continue on your current medications as directed. Please refer to the Current Medication list given to you today.   Testing/Procedures: Your physician has requested that you have an echocardiogram. Echocardiography is a painless test that uses sound waves to create images of your heart. It provides your doctor with information about the size and shape of your heart and how well your heart's chambers and valves are working. This procedure takes approximately one hour. There are no restrictions for this procedure.  Follow-Up: Your physician wants you to follow-up in: 2 years with Dr. Gwenlyn Found. You will receive a reminder letter in the mail two months in advance. If you don't receive a letter, please call our office to schedule the follow-up appointment.  If you need a refill on your cardiac medications before your next appointment, please call your pharmacy.

## 2018-04-20 NOTE — Assessment & Plan Note (Signed)
History of PAF status post left atrial myxoma resection and left atrial surgical Maze procedure by Dr. Roxy Manns with resultant resolution of her atrial fibrillation.  She is no longer on anticoagulation or heart medications and has had no recurrence of her A. fib.

## 2018-04-20 NOTE — Progress Notes (Signed)
04/20/2018 Miranda Taylor   [birth year]  [id number]  Primary Physician Aletha Halim., PA-C Primary Cardiologist: Lorretta Harp MD FACP, [place], [place], [place]  HPI:  Miranda Taylor is a 57 y.o.  mildly overweight married Caucasian female, mother of 34 and grandmother to 2 grandchildren, whom I last saw in the office 04/27/2016.Marland Kitchen She has a history of symptomatic paroxysmal atrial fibrillation with a low [place] score on beta-blocker and aspirin. She had a normal 2D echo and stress test. I did an event monitor that showed multiple episodes of PAF, which she is symptomatic from with complaints of shortness of breath and fatigue. I referred her to Dr. Thompson Grayer at [facility], Electrophysiologist, who placed her on flecainide 50 p.o. b.i.d. empirically. Her symptoms have improved.she had symptoms compatible with obstructive sleep apnea when I last saw her 2 years ago and a subsequent outpatient sleep study did not suggest this. She was recently admitted to the hospital 02/03/15 for one week with A. Fib with RVR. A transesophageal echo showed reduced systolic function with 36-64% range with left atrial smoke/thrombus and therefore cardioversion was not performed. She was placed on amiodarone and rate controlled and placed on oral anticoagulation. She subsequently converted o normal sinus rhythm. 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 Roxy Manns which was performed in March. Her postoperative echo revealed normal LV function. She feels clinically improved.   Since I saw her last 2 years ago her amiodarone has been discontinued as has her Eliquis.  She is had no recurrence of atrial fibrillation.  Current Meds  Medication Sig  . cycloSPORINE (RESTASIS) 0.05 % ophthalmic emulsion Place 1 drop into both eyes 2 (two) times daily as needed (for dry eyes).   Marland Kitchen  desvenlafaxine (PRISTIQ) 50 MG 24 hr tablet Take 100 mg by mouth daily before breakfast.   . LORazepam (ATIVAN) 0.5 MG tablet Take 0.5 mg by mouth at bedtime.   . Multiple Vitamin (MULITIVITAMIN WITH MINERALS) TABS Take 1 tablet by mouth daily.  Marland Kitchen NEXIUM 40 MG capsule Take 40 mg by mouth daily as needed (for heartburn).   Marland Kitchen oxyCODONE (OXY IR/ROXICODONE) 5 MG immediate release tablet Take 1-2 tablets (5-10 mg total) by mouth every 4 (four) hours as needed for severe pain.  . rizatriptan (MAXALT-MLT) 10 MG disintegrating tablet Take 10 mg by mouth daily as needed for migraine.      No Known Allergies  Social History   Socioeconomic History  . Marital status: Married    Spouse name: Not on file  . Number of children: Not on file  . Years of education: Not on file  . Highest education level: Not on file  Occupational History  . Occupation: Disabled  Social Needs  . Financial resource strain: Not on file  . Food insecurity:    Worry: Not on file    Inability: Not on file  . Transportation needs:    Medical: Not on file    Non-medical: Not on file  Tobacco Use  . Smoking status: Former Smoker    Packs/day: 0.50    Years: 20.00    Pack years: 10.00    Types: Cigarettes    Last attempt to quit: 11/07/2001    Years since quitting: 16.4  . Smokeless tobacco: Never Used  Substance and Sexual Activity  . Alcohol use: No  . Drug use:  No  . Sexual activity: Not on file  Lifestyle  . Physical activity:    Days per week: Not on file    Minutes per session: Not on file  . Stress: Not on file  Relationships  . Social connections:    Talks on phone: Not on file    Gets together: Not on file    Attends religious service: Not on file    Active member of club or organization: Not on file    Attends meetings of clubs or organizations: Not on file    Relationship status: Not on file  . Intimate partner violence:    Fear of current or ex partner: Not on file    Emotionally abused: Not on  file    Physically abused: Not on file    Forced sexual activity: Not on file  Other Topics Concern  . Not on file  Social History Narrative   Pt lives with husband in [place].  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  Cardiovascular: negative for chest pain, dyspnea on exertion, edema, orthopnea, palpitations, paroxysmal nocturnal dyspnea or shortness of breath Dermatological: negative for rash Respiratory: negative for cough or wheezing Urologic: negative for hematuria Abdominal: negative for nausea, vomiting, diarrhea, bright red blood per rectum, melena, or hematemesis Neurologic: negative for visual changes, syncope, or dizziness All other systems reviewed and are otherwise negative except as noted above.    Blood pressure 130/80, pulse 85, height 5\' 7"  (1.702 m), weight 179 lb (81.2 kg), last menstrual period 12/25/2012.  General appearance: alert and no distress Neck: no adenopathy, no carotid bruit, no JVD, supple, symmetrical, trachea midline and thyroid not enlarged, symmetric, no tenderness/mass/nodules Lungs: clear to auscultation bilaterally Heart: regular rate and rhythm, S1, S2 normal, no murmur, click, rub or gallop Extremities: extremities normal, atraumatic, no cyanosis or edema Pulses: 2+ and symmetric Skin: Skin color, texture, turgor normal. No rashes or lesions Neurologic: Alert and oriented X 3, normal strength and tone. Normal symmetric reflexes. Normal coordination and gait  EKG not performed today  ASSESSMENT AND PLAN:   PAF (paroxysmal atrial fibrillation) (HCC) History of PAF status post left atrial myxoma resection and left atrial surgical Maze procedure by Dr. Roxy Manns with resultant resolution of her atrial fibrillation.  She is no longer on anticoagulation or heart medications and has had no recurrence of her A.  fib.  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 Roxy Manns.  There has been no recurrence of atrial fibrillation.  I am going to recheck a 2D echocardiogram because of mild pulmonary hypertension.      Lorretta Harp MD FACP,FACC,FAHA, [facility] 04/20/2018 11:49 AM

## 2018-04-20 NOTE — Assessment & Plan Note (Signed)
Minimally invasive resection of left atrial myxoma with atrial Maze procedure by Dr. Roxy Manns.  There has been no recurrence of atrial fibrillation.  I am going to recheck a 2D echocardiogram because of mild pulmonary hypertension.

## 2018-04-25 ENCOUNTER — Other Ambulatory Visit: Payer: Self-pay

## 2018-04-25 ENCOUNTER — Ambulatory Visit (HOSPITAL_COMMUNITY): Payer: BLUE CROSS/BLUE SHIELD | Attending: Cardiology

## 2018-04-25 DIAGNOSIS — Z9889 Other specified postprocedural states: Secondary | ICD-10-CM | POA: Insufficient documentation

## 2018-04-25 DIAGNOSIS — I083 Combined rheumatic disorders of mitral, aortic and tricuspid valves: Secondary | ICD-10-CM | POA: Insufficient documentation

## 2018-04-25 DIAGNOSIS — I480 Paroxysmal atrial fibrillation: Secondary | ICD-10-CM

## 2018-04-25 DIAGNOSIS — I4891 Unspecified atrial fibrillation: Secondary | ICD-10-CM | POA: Diagnosis present

## 2018-06-19 ENCOUNTER — Telehealth: Payer: Self-pay | Admitting: *Deleted

## 2018-06-19 NOTE — Telephone Encounter (Signed)
   [facility] Pre-operative Risk Assessment    Request for surgical clearance:  1. What type of surgery is being performed? EGD WITH DILATION  2. When is this surgery scheduled? 06/29/18  3. What type of clearance is required (medical clearance vs. Pharmacy clearance to hold med vs. Both)? MEDICAL  4. Are there any medications that need to be held prior to surgery and how long? N./A  5. Practice name and name of physician performing surgery?  [facility]  6. What is your office phone number [phone]   7.   What is your office fax number [phone]  8.   Anesthesia type (None, local, MAC, general) ? [place] 06/19/2018, 1:28 PM  _________________________________________________________________   (provider comments below)

## 2018-06-21 NOTE — Telephone Encounter (Signed)
   Primary Cardiologist: No primary care provider on file.  Chart reviewed as part of pre-operative protocol coverage. Patient was contacted 06/21/2018 in reference to pre-operative risk assessment for pending surgery as outlined below.  Miranda Taylor was last seen on 04/20/18 by Dr. Gwenlyn Found.  Since that day, Miranda Taylor has done well. She reports no new symptoms. She can complete more than 4.0 METS.  Therefore, based on ACC/AHA guidelines, the patient would be at acceptable risk for the planned procedure without further cardiovascular testing.   I will route this recommendation to the requesting party via Epic fax function and remove from pre-op pool.  Please call with questions.  Tami Lin Duke, PA 06/21/2018, 3:01 PM

## 2018-06-21 NOTE — Telephone Encounter (Signed)
Follow up     Patient states that she received a call in reference to her preop clearance. Please call.

## 2018-11-28 ENCOUNTER — Other Ambulatory Visit: Payer: Self-pay

## 2018-11-28 ENCOUNTER — Encounter (HOSPITAL_BASED_OUTPATIENT_CLINIC_OR_DEPARTMENT_OTHER): Payer: Self-pay

## 2018-11-28 ENCOUNTER — Emergency Department (HOSPITAL_BASED_OUTPATIENT_CLINIC_OR_DEPARTMENT_OTHER)
Admission: EM | Admit: 2018-11-28 | Discharge: 2018-11-28 | Disposition: A | Discharge status: Home / Self Care | Payer: BLUE CROSS/BLUE SHIELD | Attending: Emergency Medicine | Admitting: Emergency Medicine

## 2018-11-28 DIAGNOSIS — M436 Torticollis: Secondary | ICD-10-CM

## 2018-11-28 DIAGNOSIS — I110 Hypertensive heart disease with heart failure: Secondary | ICD-10-CM | POA: Diagnosis not present

## 2018-11-28 DIAGNOSIS — Z87891 Personal history of nicotine dependence: Secondary | ICD-10-CM | POA: Diagnosis not present

## 2018-11-28 DIAGNOSIS — I5032 Chronic diastolic (congestive) heart failure: Secondary | ICD-10-CM | POA: Insufficient documentation

## 2018-11-28 DIAGNOSIS — M542 Cervicalgia: Secondary | ICD-10-CM | POA: Diagnosis present

## 2018-11-28 DIAGNOSIS — Z79899 Other long term (current) drug therapy: Secondary | ICD-10-CM | POA: Diagnosis not present

## 2018-11-28 MED ORDER — HYDROCODONE-ACETAMINOPHEN 5-325 MG PO TABS
1.0000 | ORAL_TABLET | Freq: Once | ORAL | Status: AC
  Administered 2018-11-28: 1 via ORAL
  Filled 2018-11-28: qty 1

## 2018-11-28 MED ORDER — METHOCARBAMOL 500 MG PO TABS: 500.0000 mg | ORAL_TABLET | Freq: Two times a day (BID) | ORAL | 0 refills | Status: DC

## 2018-11-28 MED ORDER — DIAZEPAM 5 MG PO TABS
5.0000 mg | ORAL_TABLET | Freq: Once | ORAL | Status: AC
  Administered 2018-11-28: 5 mg via ORAL
  Filled 2018-11-28: qty 1

## 2018-11-28 MED ORDER — METHOCARBAMOL 500 MG PO TABS
500.0000 mg | ORAL_TABLET | Freq: Once | ORAL | Status: AC
  Administered 2018-11-28: 500 mg via ORAL
  Filled 2018-11-28: qty 1

## 2018-11-28 NOTE — ED Triage Notes (Signed)
Pt states she woke yesterday with posterior neck-pain worse with movement-denies injury-NAD-steady gait

## 2018-11-28 NOTE — Discharge Instructions (Signed)
Please use Robaxin in addition to your home pain medications to help with neck spasm you can also continue using heat and over-the-counter muscle rubs.  You can also use massage.  If symptoms are still not improving please follow-up with your primary care doctor for reevaluation.  You have significantly worsened neck pain you develop numbness tingling or weakness in your arm, severe headache, vision changes or any other new or concerning symptoms return to the emergency department for reevaluation.

## 2018-11-28 NOTE — ED Provider Notes (Signed)
[place] [facility] EMERGENCY DEPARTMENT Provider Note   CSN: [id number] Arrival date & time: 11/28/18  1635     History   Chief Complaint Chief Complaint  Patient presents with  . Neck Pain    HPI Miranda Taylor is a 58 y.o. female.  Miranda Taylor is a 58 y.o. female with a history of hypertension, heart murmur, A. fib, GERD, fibromyalgia, TMJ, prior cervical spinal fusion, who presents to the emergency department for evaluation of left-sided neck pain.  She reports symptoms started yesterday morning when she woke up, she denies any inciting injury but thinks she may have slept funny on her neck.  Symptoms are only present over the left side of her neck musculature and she reports it feels like a tight spasm.  Reports history of 1 prior episode of torticollis a few years ago for which she was seen in the emergency department and treated with medication which she cannot remember with improvement.  She reports pain is a constant ache that is worse with palpation or movement especially when she tries to turn her head to the left.  She denies any associated headache, vision change, nausea, vomiting or dizziness.  Denies any numbness tingling or weakness in any of her extremities.  She tried a dose of hydrocodone she had at home and also tried some over-the-counter muscle rubs without improvement and reports that when she was sleeping last night it seemed to get worse and so presented today for evaluation.  Has not had any fevers or chills.  No history of complications from her neck surgery.     Past Medical History:  Diagnosis Date  . Anxiety   . Arthritis    low back, degenerative spine   . Chronic lower back pain   . Depression   . Dysrhythmia 02/2015   afib  . GERD (gastroesophageal reflux disease)   . H/O hiatal hernia   . Heart murmur   . History of kidney stones   . Hypertension   . Migraines    h/o migraines   . Paroxysmal atrial fibrillation (HCC)   . Plantar  fasciitis   . Pleural effusion on right 12/27/2015   loculated  . Pleural effusion on right   . S/P Minimally-invasive maze operation for atrial fibrillation 12/16/2015   Complete bilateral atrial lesion set using bipolar radiofrequency and cryothermy ablation with clipping of LA appendage via right mini thoracotomy approach  . 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 Sepsis ([place]) 12/2015  . Spinal stenosis   . SUI (stress urinary incontinence, female)   . 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 TMJ (temporomandibular joint syndrome)    LEFT SIDE--  WEAR  MOUTH GUARD  . Typical atrial flutter [facility])     Patient Active Problem List   Diagnosis Date Noted  . Pleural effusion on right 12/27/2015  . Sepsis ([place]) 12/26/2015  . Pleural effusion 12/26/2015  . 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 Paroxysmal atrial fibrillation (HCC)   . Overweight 03/31/2015  . Chronic diastolic heart failure ([place]) 02/24/2015  . Atrial flutter with rapid ventricular response ([place]) 02/03/2015  . PAF (paroxysmal atrial fibrillation) ([place]) 02/03/2015  . Hyperglycemia 02/03/2015  . Family history of early CAD 10/15/2014  . Major depressive disorder, recurrent episode, moderate ([place]) 11/11/2013  . Anxiety and depression 08/26/2013  .  Chronic lower back pain   . Fibromyalgia   . GERD (gastroesophageal reflux disease)   . DDD (degenerative disc disease), cervical   . UTI (lower urinary tract infection) 04/02/2012  . Migraines 04/02/2012    Past Surgical History:  Procedure Laterality Date  . ANTERIOR CERVICAL DECOMP/DISCECTOMY FUSION  04-08-2010   C4 -- C5  . APPENDECTOMY  1984  . BILATERAL THUNB JOINT ARTHRODESIS  left 2007/   right 2010  . CARDIOVASCULAR STRESS TEST  04-27-2014   dr berry   normal  perfusion study/  no ischemia/  ef 61%  . CHEST TUBE INSERTION Right 12/28/2015  . CLIPPING OF ATRIAL APPENDAGE N/A 12/16/2015   Procedure: CLIPPING OF ATRIAL APPENDAGE;  Surgeon: Rexene Alberts, MD;  Location: [place];  Service: Open Heart Surgery;  Laterality: N/A;  . CYSTOSCOPY N/A 08/04/2014   Procedure: CYSTOSCOPY;  Surgeon: Ardis Hughs, MD;  Location[facility];  Service: Urology;  Laterality: N/A;  . LASIK  1990's  . MINIMALLY INVASIVE EXCISION OF ATRIAL MYXOMA Right 12/16/2015   Procedure: MINIMALLY INVASIVE RESECTION OF LEFT ATRIAL MASS;  Surgeon: Rexene Alberts, MD;  Location: [place];  Service: Open Heart Surgery;  Laterality: Right;  . MINIMALLY INVASIVE MAZE PROCEDURE N/A 12/16/2015   Procedure: MINIMALLY INVASIVE MAZE PROCEDURE;  Surgeon: Rexene Alberts, MD;  Location: [place];  Service: Open Heart Surgery;  Laterality: N/A;  . NEGATIVE SLEEP STUDY  04-15-2013  . POSTERIOR FUSION LUMBAR SPINE  09-16-2009   L4 -- L5  . PUBOVAGINAL SLING N/A 08/04/2014   Procedure: BOSTON SCIENTIFIC RETRO-PUBIC MID URETHRAL SLING;  Surgeon: Ardis Hughs, MD;  Location[facility];  Service: Urology;  Laterality: N/A;  . TEE WITHOUT CARDIOVERSION N/A 02/06/2015   Procedure: TRANSESOPHAGEAL ECHOCARDIOGRAM (TEE);  Surgeon: Lelon Perla, MD;  Location: [facility] ENDOSCOPY;  Service: Cardiovascular;  Laterality: N/A;  . TEE WITHOUT CARDIOVERSION N/A 05/14/2015   Procedure: TRANSESOPHAGEAL ECHOCARDIOGRAM (TEE);  Surgeon: Sanda Klein, MD;  Location: [facility] ENDOSCOPY;  Service: Cardiovascular;  Laterality: N/A;  . TEE WITHOUT CARDIOVERSION N/A 09/02/2015   Procedure: TRANSESOPHAGEAL ECHOCARDIOGRAM (TEE);  Surgeon: Sueanne Margarita, MD;  Location: [facility] ENDOSCOPY;  Service: Cardiovascular;  Laterality: N/A;  . TEE WITHOUT CARDIOVERSION N/A 09/09/2015   Procedure: TRANSESOPHAGEAL ECHOCARDIOGRAM (TEE);  Surgeon: Lelon Perla, MD;  Location: [facility] ENDOSCOPY;  Service: Cardiovascular;   Laterality: N/A;  . TEE WITHOUT CARDIOVERSION N/A 12/16/2015   Procedure: TRANSESOPHAGEAL ECHOCARDIOGRAM (TEE);  Surgeon: Rexene Alberts, MD;  Location: [facility];  Service: Open Heart Surgery;  Laterality: N/A;  . TRANSTHORACIC ECHOCARDIOGRAM  04-03-2012   normal LVF/  ef 55-60%     OB History   No obstetric history on file.      Home Medications    Prior to Admission medications   Medication Sig Start Date End Date Taking? Authorizing Provider  cycloSPORINE (RESTASIS) 0.05 % ophthalmic emulsion Place 1 drop into both eyes 2 (two) times daily as needed (for dry eyes).     [provider]  desvenlafaxine (PRISTIQ) 50 MG 24 hr tablet Take 100 mg by mouth daily before breakfast.     [provider]  LORazepam (ATIVAN) 0.5 MG tablet Take 0.5 mg by mouth at bedtime.     [provider]  Multiple Vitamin (MULITIVITAMIN WITH MINERALS) TABS Take 1 tablet by mouth daily.    [provider]  NEXIUM 40 MG capsule Take 40 mg by mouth daily as needed (for heartburn).  06/25/12   [provider]  oxyCODONE (OXY IR/ROXICODONE) 5 MG immediate release tablet Take 1-2 tablets (5-10 mg total) by mouth every 4 (four) hours as needed for severe pain. 01/01/16   Nani Skillern, PA-C  rizatriptan (MAXALT-MLT) 10 MG disintegrating tablet Take 10 mg by mouth daily as needed for migraine.  02/26/13   [provider]    Family History Family History  Problem Relation Age of Onset  . Heart attack Father 39  . CAD Father 16  . Arrhythmia Father   . Heart disease Mother   . Hypertension Other     Social History Social History   Tobacco Use  . Smoking status: Former Smoker    Packs/day: 0.50    Years: 20.00    Pack years: 10.00    Types: Cigarettes    Last attempt to quit: 11/07/2001    Years since quitting: 17.0  . Smokeless tobacco: Never Used  Substance Use Topics  . Alcohol use: No  . Drug use: No     Allergies   Patient has no known  allergies.   Review of Systems Review of Systems  Constitutional: Negative for chills and fever.  HENT: Negative.  Negative for sore throat, trouble swallowing and voice change.   Respiratory: Negative for cough, shortness of breath and stridor.   Cardiovascular: Negative for chest pain.  Musculoskeletal: Positive for neck pain and neck stiffness.  Skin: Negative for color change and rash.  Neurological: Negative for dizziness, tremors, seizures, syncope, facial asymmetry, speech difficulty, light-headedness, numbness and headaches.     Physical Exam Updated Vital Signs BP 125/72 (BP Location: Left Arm)   Pulse 85   Temp 98.6 F (37 C) (Oral)   Resp 18   Ht 5\' 7"  (1.702 m)   Wt 83.5 kg   LMP 12/25/2012   SpO2 99%   BMI 28.82 kg/m   Physical Exam Vitals signs and nursing note reviewed.  Constitutional:      General: She is not in acute distress.    Appearance: She is well-developed. She is not diaphoretic.  HENT:     Head: Normocephalic and atraumatic.     Mouth/Throat:     Mouth: Mucous membranes are moist.     Pharynx: Oropharynx is clear.  Eyes:     General:        Right eye: No discharge.        Left eye: No discharge.  Neck:     Comments: There is no midline C-spine tenderness and no palpable deformity.  Tenderness to palpation over the left neck musculature primarily the trapezius and SCM with palpable spasm, no overlying erythema or skin changes.  Range of motion limited on the left due to pain.  There is no anterior neck tenderness, no stridor on auscultation and no lymphadenopathy Pulmonary:     Effort: Pulmonary effort is normal. No respiratory distress.  Neurological:     General: No focal deficit present.     Mental Status: She is alert and oriented to person, place, and time. Mental status is at baseline.     Coordination: Coordination normal.     Comments: Speech is clear, able to follow commands CN III-XII intact Normal strength in upper and lower  extremities bilaterally including dorsiflexion and plantar flexion, strong and equal grip strength Sensation normal to light and sharp touch Moves extremities without ataxia, coordination intact  Psychiatric:        Mood and Affect: Mood normal.  Behavior: Behavior normal.      ED Treatments / Results  Labs (all labs ordered are listed, but only abnormal results are displayed) Labs Reviewed - No data to display  EKG None  Radiology No results found.  Procedures Procedures (including critical care time)  Medications Ordered in ED Medications  methocarbamol (ROBAXIN) tablet 500 mg (has no administration in time range)  diazepam (VALIUM) tablet 5 mg (5 mg Oral Given 11/28/18 1752)  HYDROcodone-acetaminophen (NORCO/VICODIN) 5-325 MG per tablet 1 tablet (1 tablet Oral Given 11/28/18 1752)     Initial Impression / Assessment and Plan / ED Course  I have reviewed the triage vital signs and the nursing notes.  Pertinent labs & imaging results that were available during my care of the patient were reviewed by me and considered in my medical decision making (see chart for details).  Patient presents with spasm and pain over the left side of the neck which is been present and worsening since she got up from sleep yesterday.  History of torticollis in the past which improved with Valium and muscle relaxers.  Patient does have remote history of anterior cervical fusion but no complications from this.  No associated neurologic symptoms and no fevers or chills.  Pain reproducible with palpation over left-sided neck muscles.  Posterior oropharynx clear, no lymphadenopathy and no stridor.  No neurologic deficits noted on exam.  Presentation consistent with torticollis, will treat with Valium and pain medication here in the emergency department and patient will be discharged home with muscle relaxers.  PDMP reviewed and patient was recently prescribed a course of oxycodone from Dr. Nelva Bush  patient reports she still has some of these available to take in addition to muscle relaxer.  I have discussed appropriate return precautions and patient expresses understanding and agreement with plan.  Discharged home in good condition.  Final Clinical Impressions(s) / ED Diagnoses   Final diagnoses:  Torticollis, acute    ED Discharge Orders         Ordered    methocarbamol (ROBAXIN) 500 MG tablet  2 times daily     11/28/18 1909           Janet Berlin 11/28/18 1911    Gareth Morgan, MD 11/30/18 [phone]

## 2018-12-13 ENCOUNTER — Telehealth: Payer: Self-pay | Admitting: *Deleted

## 2018-12-13 NOTE — Telephone Encounter (Signed)
   [facility] Pre-operative Risk Assessment    Request for surgical clearance:  1. What type of surgery is being performed? RIGHT SHOULDER SCOPE ASAD.RCR.DCR,AND BICEPTENODESIS  2. When is this surgery scheduled? TBD  3. What type of clearance is required (medical clearance vs. Pharmacy clearance to hold med vs. Both)? MEDICAL  4. Are there any medications that need to be held prior to surgery and how long? N/A  5. Practice name and name of physician performing surgery?  [facility]  6. What is your office phone number [phone]   7.   What is your office fax number [phone]  8.   Anesthesia type (None, local, MAC, general) ? [facility]   Devra Dopp 12/13/2018, 10:09 AM  _________________________________________________________________   (provider comments below)

## 2018-12-26 NOTE — Telephone Encounter (Signed)
   Primary Cardiologist:Jonathan Gwenlyn Found, MD  Chart reviewed as part of pre-operative protocol coverage. Because of Miranda Taylor's past medical history and time since last visit, he/she will require a follow-up visit in order to better assess preoperative cardiovascular risk.  Pre-op covering staff: - Please schedule appointment and call patient to inform them. - Please contact requesting surgeon's office via preferred method (i.e, phone, fax) to inform them of need for appointment prior to surgery. Leanor Kail, PA  12/26/2018, 9:14 AM

## 2018-12-26 NOTE — Telephone Encounter (Signed)
Pt is scheduled to see Dr. Gwenlyn Found 12/28/2018.

## 2018-12-26 NOTE — Telephone Encounter (Signed)
1st attempt to reach pt re: surgical clearance.  Left a detailed message for pt to call back and make an appt before clearance can be addressed.

## 2018-12-28 ENCOUNTER — Ambulatory Visit: Payer: BLUE CROSS/BLUE SHIELD | Admitting: Cardiovascular Disease

## 2019-01-11 ENCOUNTER — Ambulatory Visit (INDEPENDENT_AMBULATORY_CARE_PROVIDER_SITE_OTHER): Payer: BLUE CROSS/BLUE SHIELD | Admitting: Cardiovascular Disease

## 2019-01-11 ENCOUNTER — Encounter: Payer: Self-pay | Admitting: Cardiovascular Disease

## 2019-01-11 DIAGNOSIS — I480 Paroxysmal atrial fibrillation: Secondary | ICD-10-CM

## 2019-01-11 NOTE — Patient Instructions (Signed)
Medication Instructions:  Your physician recommends that you continue on your current medications as directed. Please refer to the Current Medication list given to you today.  If you need a refill on your cardiac medications before your next appointment, please call your pharmacy.   Lab work: NONE If you have labs (blood work) drawn today and your tests are completely normal, you will receive your results only by: Marland Kitchen MyChart Message (if you have MyChart) OR . A paper copy in the mail If you have any lab test that is abnormal or we need to change your treatment, we will call you to review the results.  Testing/Procedures: NONE  Follow-Up: At [facility], you and your health needs are our priority.  As part of our continuing mission to provide you with exceptional heart care, we have created designated Provider Care Teams.  These Care Teams include your primary Cardiologist (physician) and Advanced Practice Providers (APPs -  Physician Assistants and Nurse Practitioners) who all work together to provide you with the care you need, when you need it. . You will need a follow up appointment in 12 months.  Please call our office 2 months in advance to schedule this appointment.  You may see Dr. Gwenlyn Found or one of the following Advanced Practice Providers on your designated Care Team:   . Kerin Ransom, [place] . Almyra Deforest, PA-C . Fabian Sharp, PA-C . Jory Sims, DNP . Rosaria Ferries, PA-C . Roby Lofts, PA-C . Sande Rives, PA-C  Any Other Special Instructions Will Be Listed Below (If Applicable). YOU HAVE BEEN CLEARED AT LOW RISK FROM A CARDIAC STANDPOINT FOR YOUR UPCOMING PROCEDURE

## 2019-01-11 NOTE — Progress Notes (Signed)
01/11/2019 Miranda Taylor   1960/12/10  [id number]  Primary Physician Aletha Halim., PA-C Primary Cardiologist: Lorretta Harp MD FACP, [place], [place], [place]  HPI:  Miranda Taylor is a 58 y.o.  mildly overweight married Caucasian female, mother of 25 and grandmother to 2 grandchildren, whom I last saw in the office  04/20/2018.Marland Kitchen She has a history of symptomatic paroxysmal atrial fibrillation with a low [place] score on beta-blocker and aspirin. She had a normal 2D echo and stress test. I did an event monitor that showed multiple episodes of PAF, which she is symptomatic from with complaints of shortness of breath and fatigue. I referred her to Dr. Thompson Grayer at [facility], Electrophysiologist, who placed her on flecainide 50 p.o. b.i.d. empirically. Her symptoms have improved.she had symptoms compatible with obstructive sleep apnea when I last saw her 2 years ago and a subsequent outpatient sleep study did not suggest this. She was recently admitted to the hospital 02/03/15 for one week with A. Fib with RVR. A transesophageal echo showed reduced systolic function with 10-93% range with left atrial smoke/thrombus and therefore cardioversion was not performed. She was placed on amiodarone and rate controlled and placed on oral anticoagulation. She subsequently converted o normal sinus rhythm. 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 Roxy Manns which was performed in March 2019. Her postoperative echo revealed normal LV function. She feels clinically improved.   Since I saw her last 1 year ago her amiodarone has been discontinued as has her Eliquis.  She is had no recurrence of atrial fibrillation.    Current Meds  Medication Sig  . cycloSPORINE (RESTASIS) 0.05 % ophthalmic emulsion Place 1 drop into both eyes 2 (two) times daily as needed (for dry eyes).     Marland Kitchen desvenlafaxine (PRISTIQ) 50 MG 24 hr tablet Take 100 mg by mouth daily before breakfast.   . LORazepam (ATIVAN) 0.5 MG tablet Take 0.5 mg by mouth at bedtime.   . methocarbamol (ROBAXIN) 500 MG tablet Take 1 tablet (500 mg total) by mouth 2 (two) times daily.  . Multiple Vitamin (MULITIVITAMIN WITH MINERALS) TABS Take 1 tablet by mouth daily.  Marland Kitchen NEXIUM 40 MG capsule Take 40 mg by mouth daily as needed (for heartburn).   Marland Kitchen oxyCODONE (OXY IR/ROXICODONE) 5 MG immediate release tablet Take 1-2 tablets (5-10 mg total) by mouth every 4 (four) hours as needed for severe pain.  . rizatriptan (MAXALT-MLT) 10 MG disintegrating tablet Take 10 mg by mouth daily as needed for migraine.      No Known Allergies  Social History   Socioeconomic History  . Marital status: Married    Spouse name: Not on file  . Number of children: Not on file  . Years of education: Not on file  . Highest education level: Not on file  Occupational History  . Occupation: Disabled  Social Needs  . Financial resource strain: Not on file  . Food insecurity:    Worry: Not on file    Inability: Not on file  . Transportation needs:    Medical: Not on file    Non-medical: Not on file  Tobacco Use  . Smoking status: Former Smoker    Packs/day: 0.50    Years: 20.00    Pack years: 10.00    Types: Cigarettes    Last attempt to quit: 11/07/2001  Years since quitting: 17.1  . Smokeless tobacco: Never Used  Substance and Sexual Activity  . Alcohol use: No  . Drug use: No  . Sexual activity: Not on file  Lifestyle  . Physical activity:    Days per week: Not on file    Minutes per session: Not on file  . Stress: Not on file  Relationships  . Social connections:    Talks on phone: Not on file    Gets together: Not on file    Attends religious service: Not on file    Active member of club or organization: Not on file    Attends meetings of clubs or organizations: Not on file    Relationship status: Not on file  .  Intimate partner violence:    Fear of current or ex partner: Not on file    Emotionally abused: Not on file    Physically abused: Not on file    Forced sexual activity: Not on file  Other Topics Concern  . Not on file  Social History Narrative   Pt lives with husband in [place].  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  Cardiovascular: negative for chest pain, dyspnea on exertion, edema, orthopnea, palpitations, paroxysmal nocturnal dyspnea or shortness of breath Dermatological: negative for rash Respiratory: negative for cough or wheezing Urologic: negative for hematuria Abdominal: negative for nausea, vomiting, diarrhea, bright red blood per rectum, melena, or hematemesis Neurologic: negative for visual changes, syncope, or dizziness All other systems reviewed and are otherwise negative except as noted above.    Blood pressure 110/80, pulse 69, height 5\' 7"  (1.702 m), weight 187 lb 9.6 oz (85.1 kg), last menstrual period 12/25/2012.  General appearance: alert and no distress Neck: no adenopathy, no carotid bruit, no JVD, supple, symmetrical, trachea midline and thyroid not enlarged, symmetric, no tenderness/mass/nodules Lungs: clear to auscultation bilaterally Heart: regular rate and rhythm, S1, S2 normal, no murmur, click, rub or gallop Extremities: extremities normal, atraumatic, no cyanosis or edema Pulses: 2+ and symmetric Skin: Skin color, texture, turgor normal. No rashes or lesions Neurologic: Alert and oriented X 3, normal strength and tone. Normal symmetric reflexes. Normal coordination and gait  EKG sinus rhythm at 69 without ST or T wave changes.  I Personally reviewed this EKG.  ASSESSMENT AND PLAN:   PAF (paroxysmal atrial fibrillation) (HCC) History of PAF in the past status post chemical cardioversion on amiodarone.  She  subsequent underwent left atrial myxoma excision and left atrial maze procedure by Dr. Roxy Manns in March of last year.  She is had no recurrent A. fib.  Recent 2D echo performed 04/25/2018 showed normal LV systolic function with normal post myxoma resection findings.      Lorretta Harp MD FACP,FACC,FAHA, [facility] 01/11/2019 12:14 PM

## 2019-01-11 NOTE — Assessment & Plan Note (Signed)
History of PAF in the past status post chemical cardioversion on amiodarone.  She subsequent underwent left atrial myxoma excision and left atrial maze procedure by Dr. Roxy Manns in March of last year.  She is had no recurrent A. fib.  Recent 2D echo performed 04/25/2018 showed normal LV systolic function with normal post myxoma resection findings.

## 2020-04-24 ENCOUNTER — Ambulatory Visit: Payer: BLUE CROSS/BLUE SHIELD | Admitting: Cardiovascular Disease

## 2020-05-05 ENCOUNTER — Other Ambulatory Visit: Payer: Self-pay

## 2020-05-05 ENCOUNTER — Ambulatory Visit (INDEPENDENT_AMBULATORY_CARE_PROVIDER_SITE_OTHER): Payer: BC Managed Care – PPO | Admitting: Cardiovascular Disease

## 2020-05-05 ENCOUNTER — Encounter: Payer: Self-pay | Admitting: Cardiovascular Disease

## 2020-05-05 VITALS — BP 134/86 | HR 71 | Ht 67.0 in | Wt 189.0 lb

## 2020-05-05 DIAGNOSIS — I480 Paroxysmal atrial fibrillation: Secondary | ICD-10-CM | POA: Diagnosis not present

## 2020-05-05 NOTE — Assessment & Plan Note (Signed)
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  She has had no further episodes of PAF and is currently off of all antiarrhythmic medications.

## 2020-05-05 NOTE — Patient Instructions (Signed)
Medication Instructions:  NO CHANGE *If you need a refill on your cardiac medications before your next appointment, please call your pharmacy*   Lab Work: If you have labs (blood work) drawn today and your tests are completely normal, you will receive your results only by: Marland Kitchen MyChart Message (if you have MyChart) OR . A paper copy in the mail If you have any lab test that is abnormal or we need to change your treatment, we will call you to review the results.   Follow-Up: At [facility], you and your health needs are our priority.  As part of our continuing mission to provide you with exceptional heart care, we have created designated Provider Care Teams.  These Care Teams include your primary Cardiologist (physician) and Advanced Practice Providers (APPs -  Physician Assistants and Nurse Practitioners) who all work together to provide you with the care you need, when you need it.  We recommend signing up for the patient portal called "MyChart".  Sign up information is provided on this After Visit Summary.  MyChart is used to connect with patients for Virtual Visits (Telemedicine).  Patients are able to view lab/test results, encounter notes, upcoming appointments, etc.  Non-urgent messages can be sent to your provider as well.   To learn more about what you can do with MyChart, go to [email or web address].    Your next appointment:   12 month(s)  The format for your next appointment:   Either In Person or Virtual  Provider:   You may see Quay Burow, MD or one of the following Advanced Practice Providers on your designated Care Team:    Kerin Ransom, PA-C  [place], [place]  Coletta Memos, [place]

## 2020-05-05 NOTE — Progress Notes (Signed)
05/05/2020 Miranda Taylor   08-20-1961  [id number]  Primary Physician Aletha Halim., PA-C Primary Cardiologist: Lorretta Harp MD FACP, [place], [place], [place]  HPI:  Miranda Taylor is a 59 y.o.    mildly overweight married Caucasian female, mother of 4 and grandmother to 7 grandchildren, whom I last saw in the office  01/11/2019.Marland Kitchen She has a history of symptomatic paroxysmal atrial fibrillation with a low [place] score on beta-blocker and aspirin. She had a normal 2D echo and stress test. I did an event monitor that showed multiple episodes of PAF, which she is symptomatic from with complaints of shortness of breath and fatigue. I referred her to Dr. Thompson Grayer at [facility], Electrophysiologist, who placed her on flecainide 50 p.o. b.i.d. empirically. Her symptoms have improved.she had symptoms compatible with obstructive sleep apnea when I last saw her 2 years ago and a subsequent outpatient sleep study did not suggest this. She was recently admitted to the hospital 02/03/15 for one week with A. Fib with RVR. A transesophageal echo showed reduced systolic function with 41-28% range with left atrial smoke/thrombus and therefore cardioversion was not performed. She was placed on amiodarone and rate controlled and placed on oral anticoagulation. She subsequently converted o normal sinus rhythm. 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 Roxy Manns which was performed in March 2019. Her postoperative echo revealed normal LV function. She feels clinically improved.  Since I saw her last over a year ago she continues to do well.  She is not on antiantiarrhythmic or oral anticoagulant.  She said no further episodes of A. fib.  She denies chest pain or shortness of breath.    Current Meds  Medication Sig   cycloSPORINE (RESTASIS) 0.05 % ophthalmic emulsion Place  1 drop into both eyes 2 (two) times daily as needed (for dry eyes).    desvenlafaxine (PRISTIQ) 100 MG 24 hr tablet Take 100 mg by mouth daily.   LORazepam (ATIVAN) 0.5 MG tablet Take 0.5 mg by mouth at bedtime.    Multiple Vitamin (MULITIVITAMIN WITH MINERALS) TABS Take 1 tablet by mouth daily.   NEXIUM 40 MG capsule Take 40 mg by mouth daily as needed (for heartburn).    oxyCODONE (OXY IR/ROXICODONE) 5 MG immediate release tablet Take 1-2 tablets (5-10 mg total) by mouth every 4 (four) hours as needed for severe pain.   rizatriptan (MAXALT-MLT) 10 MG disintegrating tablet Take 10 mg by mouth daily as needed for migraine.      No Known Allergies  Social History   Socioeconomic History   Marital status: Married    Spouse name: Not on file   Number of children: Not on file   Years of education: Not on file   Highest education level: Not on file  Occupational History   Occupation: Disabled  Tobacco Use   Smoking status: Former Smoker    Packs/day: 0.50    Years: 20.00    Pack years: 10.00    Types: Cigarettes    Quit date: 11/07/2001    Years since quitting: 18.5   Smokeless tobacco: Never Used  Scientific laboratory technician Use: Never used  Substance and Sexual Activity   Alcohol use: No   Drug use: No   Sexual activity: Not on file  Other Topics Concern   Not on file  Social History Narrative  Pt lives with husband in [place].  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   Social Determinants of Radio broadcast assistant Strain:    Difficulty of Paying Living Expenses:   Food Insecurity:    Worried About Charity fundraiser in the Last Year:    Arboriculturist in the Last Year:   Transportation Needs:    Film/video editor (Medical):    Lack of Transportation (Non-Medical):   Physical Activity:    Days of Exercise per Week:    Minutes of Exercise per Session:   Stress:    Feeling of Stress :    Social Connections:    Frequency of Communication with Friends and Family:    Frequency of Social Gatherings with Friends and Family:    Attends Religious Services:    Active Member of Clubs or Organizations:    Attends Music therapist:    Marital Status:   Intimate Partner Violence:    Fear of Current or Ex-Partner:    Emotionally Abused:    Physically Abused:    Sexually Abused:      Review of Systems: General: negative for chills, fever, night sweats or weight changes.  Cardiovascular: negative for chest pain, dyspnea on exertion, edema, orthopnea, palpitations, paroxysmal nocturnal dyspnea or shortness of breath Dermatological: negative for rash Respiratory: negative for cough or wheezing Urologic: negative for hematuria Abdominal: negative for nausea, vomiting, diarrhea, bright red blood per rectum, melena, or hematemesis Neurologic: negative for visual changes, syncope, or dizziness All other systems reviewed and are otherwise negative except as noted above.    Blood pressure 134/86, pulse 71, height 5\' 7"  (1.702 m), weight 189 lb (85.7 kg), last menstrual period 12/25/2012, SpO2 98 %.  General appearance: alert and no distress Neck: no adenopathy, no carotid bruit, no JVD, supple, symmetrical, trachea midline and thyroid not enlarged, symmetric, no tenderness/mass/nodules Lungs: clear to auscultation bilaterally Heart: regular rate and rhythm, S1, S2 normal, no murmur, click, rub or gallop Extremities: extremities normal, atraumatic, no cyanosis or edema Pulses: 2+ and symmetric Skin: Skin color, texture, turgor normal. No rashes or lesions Neurologic: Alert and oriented X 3, normal strength and tone. Normal symmetric reflexes. Normal coordination and gait  EKG sinus rhythm at 71 without ST or T wave changes.  I personally reviewed this EKG.  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  She has had no further episodes of PAF and is currently off of all antiarrhythmic medications.      Lorretta Harp MD FACP,FACC,FAHA, [facility] 05/05/2020 9:44 AM

## 2020-12-23 ENCOUNTER — Ambulatory Visit (INDEPENDENT_AMBULATORY_CARE_PROVIDER_SITE_OTHER): Payer: Medicare Other | Admitting: Orthopedic Surgery

## 2020-12-23 ENCOUNTER — Ambulatory Visit: Payer: Self-pay

## 2020-12-23 DIAGNOSIS — M659 Synovitis and tenosynovitis, unspecified: Secondary | ICD-10-CM | POA: Diagnosis not present

## 2020-12-23 DIAGNOSIS — M25561 Pain in right knee: Secondary | ICD-10-CM | POA: Diagnosis not present

## 2020-12-26 ENCOUNTER — Encounter: Payer: Self-pay | Admitting: Orthopedic Surgery

## 2020-12-26 DIAGNOSIS — M659 Synovitis and tenosynovitis, unspecified: Secondary | ICD-10-CM | POA: Diagnosis not present

## 2020-12-26 MED ORDER — BUPIVACAINE HCL 0.25 % IJ SOLN
4.0000 mL | INTRAMUSCULAR | Status: AC | PRN
Start: 2020-12-26 — End: 2020-12-26
  Administered 2020-12-26: 4 mL via INTRA_ARTICULAR

## 2020-12-26 MED ORDER — BETAMETHASONE SOD PHOS & ACET 6 (3-3) MG/ML IJ SUSP
6.0000 mg | INTRAMUSCULAR | Status: AC | PRN
  Administered 2020-12-26: 6 mg via INTRA_ARTICULAR

## 2020-12-26 MED ORDER — LIDOCAINE HCL 1 % IJ SOLN
5.0000 mL | INTRAMUSCULAR | Status: AC | PRN
Start: 2020-12-26 — End: 2020-12-26
  Administered 2020-12-26: 5 mL

## 2020-12-26 MED ORDER — BETAMETHASONE SOD PHOS & ACET 6 (3-3) MG/ML IJ SUSP
6.0000 mg | INTRAMUSCULAR | Status: AC | PRN
Start: 2020-12-26 — End: 2020-12-26
  Administered 2020-12-26: 6 mg via INTRA_ARTICULAR

## 2020-12-26 NOTE — Progress Notes (Signed)
Office Visit Note   Patient: Miranda Taylor           Date of Birth: 1960/11/17           MRN: [id number] Visit Date: 12/23/2020 Requested by: Aletha Halim., PA-C [address]  [place] [address] PCP: Aletha Halim., PA-C  Subjective: Chief Complaint  Patient presents with  . Right Knee - Pain    HPI: Miranda Taylor is a 60 year old patient with right knee pain.  Pain is gradually worsened over the last several months.  Does have a history of arthroscopy for torn meniscus performed on the knee 3 years ago in another location.  Did okay for a while now the pain is worse.  She is not had any physical therapy nor an injection.  For the past 4 weeks she reports pain in the front and back of her knee.  She has had an MRI scan which she states was okay by the report.  Takes about half a tablet of oxycodone for her back pain occasionally.  I do not have access to the MRI scan which was performed.  Patient states that the pain will wake her from sleep at night.  Denies any mechanical symptoms.  Reports difficulty with stairs.              ROS: All systems reviewed are negative as they relate to the chief complaint within the history of present illness.  Patient denies  fevers or chills.   Assessment & Plan: Visit Diagnoses:  1. Right knee pain, unspecified chronicity     Plan: Impression is right knee pain with history of arthroscopy and radiographs which look pretty normal.  Mild effusion is present so she has some type of inflammatory process going on in the knee.  Denies any other joint complaints.  Aspiration injection performed today in both knees per her request.  We will see how she does with those injections.  Before considering any type of intervention I would have to look at that MRI scan.  This is likely an exacerbation of some existing wear and tear that is not visible on plain radiographs.  Follow-up as needed.  Follow-Up Instructions: Return if symptoms worsen or fail  to improve.   Orders:  Orders Placed This Encounter  Procedures  . XR KNEE 3 VIEW RIGHT   No orders of the defined types were placed in this encounter.     Procedures: Large Joint Inj: bilateral knee on 12/26/2020 8:18 PM Indications: diagnostic evaluation, joint swelling and pain Details: 18 G 1.5 in needle, superolateral approach  Arthrogram: No  Medications (Right): 5 mL lidocaine 1 %; 4 mL bupivacaine 0.25 %; 6 mg betamethasone acetate-betamethasone sodium phosphate 6 (3-3) MG/ML Medications (Left): 5 mL lidocaine 1 %; 4 mL bupivacaine 0.25 %; 6 mg betamethasone acetate-betamethasone sodium phosphate 6 (3-3) MG/ML Outcome: tolerated well, no immediate complications Procedure, treatment alternatives, risks and benefits explained, specific risks discussed. Consent was given by the patient. Immediately prior to procedure a time out was called to verify the correct patient, procedure, equipment, support staff and site/side marked as required. Patient was prepped and draped in the usual sterile fashion.       Clinical Data: No additional findings.  Objective: Vital Signs: LMP 12/25/2012   Physical Exam:   Constitutional: Patient appears well-developed HEENT:  Head: Normocephalic Eyes:EOM are normal Neck: Normal range of motion Cardiovascular: Normal rate Pulmonary/chest: Effort normal Neurologic: Patient is alert Skin: Skin is warm  Psychiatric: Patient has normal mood and affect    Ortho Exam: Ortho exam demonstrates normal gait alignment.  Mild effusion right knee no effusion left knee.  Extensor mechanism intact bilaterally.  Collateral and cruciate ligaments intact.  No masses lymphadenopathy or skin changes noted in the knee regions.  Range of motion is full in both knees.  Pedal pulses palpable.  Extensor mechanism nontender and intact.  No patellar apprehension.  Specialty Comments:  No specialty comments available.  Imaging: No results found.   PMFS  History: Patient Active Problem List   Diagnosis Date Noted  . Pleural effusion on right 12/27/2015  . Sepsis ([place]) 12/26/2015  . Pleural effusion 12/26/2015  . 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 Paroxysmal atrial fibrillation (HCC)   . Overweight 03/31/2015  . Chronic diastolic heart failure ([place]) 02/24/2015  . Atrial flutter with rapid ventricular response ([place]) 02/03/2015  . PAF (paroxysmal atrial fibrillation) ([place]) 02/03/2015  . Hyperglycemia 02/03/2015  . Family history of early CAD 10/15/2014  . Major depressive disorder, recurrent episode, moderate ([place]) 11/11/2013  . Anxiety and depression 08/26/2013  . Chronic lower back pain   . Fibromyalgia   . GERD (gastroesophageal reflux disease)   . DDD (degenerative disc disease), cervical   . UTI (lower urinary tract infection) 04/02/2012  . Migraines 04/02/2012   Past Medical History:  Diagnosis Date  . Anxiety   . Arthritis    low back, degenerative spine   . Chronic lower back pain   . Depression   . Dysrhythmia 02/2015   afib  . GERD (gastroesophageal reflux disease)   . H/O hiatal hernia   . Heart murmur   . History of kidney stones   . Hypertension   . Migraines    h/o migraines   . Paroxysmal atrial fibrillation (HCC)   . Plantar fasciitis   . Pleural effusion on right 12/27/2015   loculated  . Pleural effusion on right   . S/P Minimally-invasive maze operation for atrial fibrillation 12/16/2015   Complete bilateral atrial lesion set using bipolar radiofrequency and cryothermy ablation with clipping of LA appendage via right mini thoracotomy approach  . 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 Sepsis ([place]) 12/2015  . Spinal stenosis   . SUI (stress urinary incontinence, female)   . 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 TMJ (temporomandibular joint syndrome)    LEFT SIDE--  WEAR  MOUTH GUARD  . Typical atrial flutter (HCC)     Family History  Problem Relation Age of Onset  . Heart attack Father 1  . CAD Father 108  . Arrhythmia Father   . Heart disease Mother   . Hypertension Other     Past Surgical History:  Procedure Laterality Date  . ANTERIOR CERVICAL DECOMP/DISCECTOMY FUSION  04-08-2010   C4 -- C5  . APPENDECTOMY  1984  . BILATERAL THUNB JOINT ARTHRODESIS  left 2007/   right 2010  . CARDIOVASCULAR STRESS TEST  04-27-2014   dr berry   normal perfusion study/  no ischemia/  ef 61%  . CHEST TUBE INSERTION Right 12/28/2015  . CLIPPING OF ATRIAL APPENDAGE N/A 12/16/2015   Procedure: CLIPPING OF ATRIAL APPENDAGE;  Surgeon: Rexene Alberts, MD;  Location: [facility];  Service: Open Heart Surgery;  Laterality: N/A;  . CYSTOSCOPY N/A 08/04/2014   Procedure: CYSTOSCOPY;  Surgeon: Ardis Hughs, MD;  Location: [facility];  Service: Urology;  Laterality: N/A;  . LASIK  1990's  . MINIMALLY INVASIVE EXCISION OF ATRIAL MYXOMA Right 12/16/2015   Procedure: MINIMALLY INVASIVE RESECTION OF LEFT ATRIAL MASS;  Surgeon: Rexene Alberts, MD;  Location: [facility];  Service: Open Heart Surgery;  Laterality: Right;  . MINIMALLY INVASIVE MAZE PROCEDURE N/A 12/16/2015   Procedure: MINIMALLY INVASIVE MAZE PROCEDURE;  Surgeon: Rexene Alberts, MD;  Location: [facility];  Service: Open Heart Surgery;  Laterality: N/A;  . NEGATIVE SLEEP STUDY  04-15-2013  . POSTERIOR FUSION LUMBAR SPINE  09-16-2009   L4 -- L5  . PUBOVAGINAL SLING N/A 08/04/2014   Procedure: BOSTON SCIENTIFIC RETRO-PUBIC MID URETHRAL SLING;  Surgeon: Ardis Hughs, MD;  Location: [facility];  Service: Urology;  Laterality: N/A;  . TEE WITHOUT CARDIOVERSION N/A 02/06/2015   Procedure: TRANSESOPHAGEAL ECHOCARDIOGRAM (TEE);  Surgeon: Lelon Perla, MD;  Location: [facility] ENDOSCOPY;  Service: Cardiovascular;  Laterality: N/A;   . TEE WITHOUT CARDIOVERSION N/A 05/14/2015   Procedure: TRANSESOPHAGEAL ECHOCARDIOGRAM (TEE);  Surgeon: Sanda Klein, MD;  Location: [facility] ENDOSCOPY;  Service: Cardiovascular;  Laterality: N/A;  . TEE WITHOUT CARDIOVERSION N/A 09/02/2015   Procedure: TRANSESOPHAGEAL ECHOCARDIOGRAM (TEE);  Surgeon: Sueanne Margarita, MD;  Location: [facility] ENDOSCOPY;  Service: Cardiovascular;  Laterality: N/A;  . TEE WITHOUT CARDIOVERSION N/A 09/09/2015   Procedure: TRANSESOPHAGEAL ECHOCARDIOGRAM (TEE);  Surgeon: Lelon Perla, MD;  Location: [facility] ENDOSCOPY;  Service: Cardiovascular;  Laterality: N/A;  . TEE WITHOUT CARDIOVERSION N/A 12/16/2015   Procedure: TRANSESOPHAGEAL ECHOCARDIOGRAM (TEE);  Surgeon: Rexene Alberts, MD;  Location: [facility];  Service: Open Heart Surgery;  Laterality: N/A;  . TRANSTHORACIC ECHOCARDIOGRAM  04-03-2012   normal LVF/  ef 55-60%   Social History   Occupational History  . Occupation: Disabled  Tobacco Use  . Smoking status: Former Smoker    Packs/day: 0.50    Years: 20.00    Pack years: 10.00    Types: Cigarettes    Quit date: 11/07/2001    Years since quitting: 19.1  . Smokeless tobacco: Never Used  Vaping Use  . Vaping Use: Never used  Substance and Sexual Activity  . Alcohol use: No  . Drug use: No  . Sexual activity: Not on file

## 2021-01-27 ENCOUNTER — Encounter: Payer: Self-pay | Admitting: Orthopedic Surgery

## 2021-01-27 ENCOUNTER — Ambulatory Visit (INDEPENDENT_AMBULATORY_CARE_PROVIDER_SITE_OTHER): Payer: Medicare Other | Admitting: Orthopedic Surgery

## 2021-01-27 ENCOUNTER — Other Ambulatory Visit: Payer: Self-pay

## 2021-01-27 DIAGNOSIS — M659 Synovitis and tenosynovitis, unspecified: Secondary | ICD-10-CM | POA: Diagnosis not present

## 2021-01-27 DIAGNOSIS — M25561 Pain in right knee: Secondary | ICD-10-CM

## 2021-01-27 MED ORDER — BUPIVACAINE HCL 0.25 % IJ SOLN
4.0000 mL | INTRAMUSCULAR | Status: AC | PRN
Start: 2021-01-27 — End: 2021-01-27
  Administered 2021-01-27: 4 mL via INTRA_ARTICULAR

## 2021-01-27 MED ORDER — LIDOCAINE HCL 1 % IJ SOLN
5.0000 mL | INTRAMUSCULAR | Status: AC | PRN
Start: 2021-01-27 — End: 2021-01-27
  Administered 2021-01-27: 5 mL

## 2021-01-27 NOTE — Progress Notes (Addendum)
Office Visit Note   Patient: Miranda Taylor           Date of Birth: [birth year]           MRN: [id number] Visit Date: 01/27/2021 Requested by: Aletha Halim., PA-C [address]  [place] [address] PCP: Aletha Halim., PA-C  Subjective: Chief Complaint  Patient presents with  . Left Knee - Pain  . Right Knee - Pain    HPI: Miranda Taylor is a 60 year old patient with bilateral knee pain right worse than left.  Had injection and aspiration 12/23/2020 which gave her relief for about 3 days.  Swelling is better.  She states that her knee pain on the right is worse now than it was 3 years ago prior to an arthroscopic intervention.  2018 MRI scan is reviewed and shows no medial sided pathology but possibly some increased signal in the anterior horn of the lateral meniscus.  She currently is not working.  She does do stand up work when she works.  She is going out of town on vacation today.              ROS: All systems reviewed are negative as they relate to the chief complaint within the history of present illness.  Patient denies  fevers or chills.   Assessment & Plan: Visit Diagnoses:  1. Synovitis of both knee joints     Plan: Impression is right knee pain with MRI scan which is reviewed today report only.  Shows partial-thickness chondral loss on the medial side where she is most symptomatic along with possible anterior horn lateral meniscal pathology.  Need to review the scan before making any decision about arthroscopic intervention.  She does have decent effusion in that knee and intervention may be indicated but we need to review the scan personally first.  Plan at this time is to aspirate and inject the knee with Toradol.  She is going to bring the images back for evaluation next week.  I will call her after that and we can come up with a plan.  Follow-Up Instructions: Return if symptoms worsen or fail to improve.   Orders:  No orders of the defined types were placed in  this encounter.  No orders of the defined types were placed in this encounter.     Procedures: Large Joint Inj: R knee on 01/27/2021 12:53 PM Indications: diagnostic evaluation, joint swelling and pain Details: 18 G 1.5 in needle, superolateral approach  Arthrogram: No  Medications: 5 mL lidocaine 1 %; 4 mL bupivacaine 0.25 % Outcome: tolerated well, no immediate complications Procedure, treatment alternatives, risks and benefits explained, specific risks discussed. Consent was given by the patient. Immediately prior to procedure a time out was called to verify the correct patient, procedure, equipment, support staff and site/side marked as required. Patient was prepped and draped in the usual sterile fashion.    1 cc Toradol injected with bupivacaine   Clinical Data: No additional findings.  Objective: Vital Signs: LMP 12/25/2012   Physical Exam:   Constitutional: Patient appears well-developed HEENT:  Head: Normocephalic Eyes:EOM are normal Neck: Normal range of motion Cardiovascular: Normal rate Pulmonary/chest: Effort normal Neurologic: Patient is alert Skin: Skin is warm Psychiatric: Patient has normal mood and affect    Ortho Exam: Ortho exam demonstrates mild right knee effusion with medial grade lateral joint line tenderness.  Patient does have pretty good range of motion with full extension to about 125 of flexion bilaterally.  Collateral crucial ligaments are stable.  Has medial greater than lateral joint line tenderness but no patellar apprehension and intact and nontender extensor mechanism.  No groin pain with internal or external [place] of the leg.  Specialty Comments:  No specialty comments available.  Imaging: No results found.   PMFS History: Patient Active Problem List   Diagnosis Date Noted  . Pleural effusion on right 12/27/2015  . Sepsis ([place]) 12/26/2015  . Pleural effusion 12/26/2015  . S/P Minimally-invasive resection of LA papillary  fibroelastoma and maze operation for atrial fibrillation 12/16/2015  . 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 Paroxysmal atrial fibrillation (HCC)   . Overweight 03/31/2015  . Chronic diastolic heart failure ([place]) 02/24/2015  . Atrial flutter with rapid ventricular response ([place]) 02/03/2015  . PAF (paroxysmal atrial fibrillation) ([place]) 02/03/2015  . Hyperglycemia 02/03/2015  . Family history of early CAD 10/15/2014  . Major depressive disorder, recurrent episode, moderate ([place]) 11/11/2013  . Anxiety and depression 08/26/2013  . Chronic lower back pain   . Fibromyalgia   . GERD (gastroesophageal reflux disease)   . DDD (degenerative disc disease), cervical   . UTI (lower urinary tract infection) 04/02/2012  . Migraines 04/02/2012   Past Medical History:  Diagnosis Date  . Anxiety   . Arthritis    low back, degenerative spine   . Chronic lower back pain   . Depression   . Dysrhythmia 02/2015   afib  . GERD (gastroesophageal reflux disease)   . H/O hiatal hernia   . Heart murmur   . History of kidney stones   . Hypertension   . Migraines    h/o migraines   . Paroxysmal atrial fibrillation (HCC)   . Plantar fasciitis   . Pleural effusion on right 12/27/2015   loculated  . Pleural effusion on right   . S/P Minimally-invasive maze operation for atrial fibrillation 12/16/2015   Complete bilateral atrial lesion set using bipolar radiofrequency and cryothermy ablation with clipping of LA appendage via right mini thoracotomy approach  . 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 Sepsis ([place]) 12/2015  . Spinal stenosis   . SUI (stress urinary incontinence, female)   . 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 TMJ (temporomandibular joint syndrome)    LEFT SIDE--  WEAR  MOUTH GUARD  . Typical atrial flutter (HCC)     Family History  Problem Relation Age of Onset  . Heart attack Father 69  . CAD  Father 48  . Arrhythmia Father   . Heart disease Mother   . Hypertension Other     Past Surgical History:  Procedure Laterality Date  . ANTERIOR CERVICAL DECOMP/DISCECTOMY FUSION  04-08-2010   C4 -- C5  . APPENDECTOMY  1984  . BILATERAL THUNB JOINT ARTHRODESIS  left 2007/   right 2010  . CARDIOVASCULAR STRESS TEST  04-27-2014   dr berry   normal perfusion study/  no ischemia/  ef 61%  . CHEST TUBE INSERTION Right 12/28/2015  . CLIPPING OF ATRIAL APPENDAGE N/A 12/16/2015   Procedure: CLIPPING OF ATRIAL APPENDAGE;  Surgeon: Rexene Alberts, MD;  Location: [facility];  Service: Open Heart Surgery;  Laterality: N/A;  . CYSTOSCOPY N/A 08/04/2014   Procedure: CYSTOSCOPY;  Surgeon: Ardis Hughs, MD;  Location: [facility];  Service: Urology;  Laterality: N/A;  . LASIK  1990's  . MINIMALLY INVASIVE EXCISION OF ATRIAL MYXOMA Right 12/16/2015   Procedure: MINIMALLY INVASIVE  RESECTION OF LEFT ATRIAL MASS;  Surgeon: Rexene Alberts, MD;  Location: [place];  Service: Open Heart Surgery;  Laterality: Right;  . MINIMALLY INVASIVE MAZE PROCEDURE N/A 12/16/2015   Procedure: MINIMALLY INVASIVE MAZE PROCEDURE;  Surgeon: Rexene Alberts, MD;  Location: [facility];  Service: Open Heart Surgery;  Laterality: N/A;  . NEGATIVE SLEEP STUDY  04-15-2013  . POSTERIOR FUSION LUMBAR SPINE  09-16-2009   L4 -- L5  . PUBOVAGINAL SLING N/A 08/04/2014   Procedure: BOSTON SCIENTIFIC RETRO-PUBIC MID URETHRAL SLING;  Surgeon: Ardis Hughs, MD;  Location: [facility];  Service: Urology;  Laterality: N/A;  . TEE WITHOUT CARDIOVERSION N/A 02/06/2015   Procedure: TRANSESOPHAGEAL ECHOCARDIOGRAM (TEE);  Surgeon: Lelon Perla, MD;  Location: [facility] ENDOSCOPY;  Service: Cardiovascular;  Laterality: N/A;  . TEE WITHOUT CARDIOVERSION N/A 05/14/2015   Procedure: TRANSESOPHAGEAL ECHOCARDIOGRAM (TEE);  Surgeon: Sanda Klein, MD;  Location: [facility] ENDOSCOPY;  Service: Cardiovascular;  Laterality: N/A;  . TEE WITHOUT  CARDIOVERSION N/A 09/02/2015   Procedure: TRANSESOPHAGEAL ECHOCARDIOGRAM (TEE);  Surgeon: Sueanne Margarita, MD;  Location: [facility] ENDOSCOPY;  Service: Cardiovascular;  Laterality: N/A;  . TEE WITHOUT CARDIOVERSION N/A 09/09/2015   Procedure: TRANSESOPHAGEAL ECHOCARDIOGRAM (TEE);  Surgeon: Lelon Perla, MD;  Location: [facility] ENDOSCOPY;  Service: Cardiovascular;  Laterality: N/A;  . TEE WITHOUT CARDIOVERSION N/A 12/16/2015   Procedure: TRANSESOPHAGEAL ECHOCARDIOGRAM (TEE);  Surgeon: Rexene Alberts, MD;  Location: [place];  Service: Open Heart Surgery;  Laterality: N/A;  . TRANSTHORACIC ECHOCARDIOGRAM  04-03-2012   normal LVF/  ef 55-60%   Social History   Occupational History  . Occupation: Disabled  Tobacco Use  . Smoking status: Former Smoker    Packs/day: 0.50    Years: 20.00    Pack years: 10.00    Types: Cigarettes    Quit date: 11/07/2001    Years since quitting: 19.2  . Smokeless tobacco: Never Used  Vaping Use  . Vaping Use: Never used  Substance and Sexual Activity  . Alcohol use: No  . Drug use: No  . Sexual activity: Not on file

## 2021-02-03 ENCOUNTER — Other Ambulatory Visit (HOSPITAL_COMMUNITY): Payer: Self-pay | Admitting: Emergency Medicine

## 2021-02-03 ENCOUNTER — Other Ambulatory Visit: Payer: Self-pay

## 2021-02-03 ENCOUNTER — Emergency Department (HOSPITAL_BASED_OUTPATIENT_CLINIC_OR_DEPARTMENT_OTHER)
Admission: EM | Admit: 2021-02-03 | Discharge: 2021-02-03 | Disposition: A | Discharge status: Home / Self Care | Payer: BC Managed Care – PPO | Attending: Emergency Medicine | Admitting: Emergency Medicine

## 2021-02-03 ENCOUNTER — Encounter (HOSPITAL_BASED_OUTPATIENT_CLINIC_OR_DEPARTMENT_OTHER): Payer: Self-pay

## 2021-02-03 ENCOUNTER — Ambulatory Visit: Payer: Medicare Other | Admitting: Orthopedic Surgery

## 2021-02-03 DIAGNOSIS — M62838 Other muscle spasm: Secondary | ICD-10-CM | POA: Diagnosis not present

## 2021-02-03 DIAGNOSIS — I110 Hypertensive heart disease with heart failure: Secondary | ICD-10-CM | POA: Diagnosis not present

## 2021-02-03 DIAGNOSIS — I5032 Chronic diastolic (congestive) heart failure: Secondary | ICD-10-CM | POA: Diagnosis not present

## 2021-02-03 DIAGNOSIS — M542 Cervicalgia: Secondary | ICD-10-CM | POA: Diagnosis present

## 2021-02-03 DIAGNOSIS — Z79899 Other long term (current) drug therapy: Secondary | ICD-10-CM | POA: Insufficient documentation

## 2021-02-03 DIAGNOSIS — Z87891 Personal history of nicotine dependence: Secondary | ICD-10-CM | POA: Insufficient documentation

## 2021-02-03 MED ORDER — TIZANIDINE HCL 2 MG PO TABS: 2.0000 mg | ORAL_TABLET | Freq: Three times a day (TID) | ORAL | 0 refills | Status: DC | PRN

## 2021-02-03 MED FILL — tiZANidine HCL 2 MG TABS: 2 | 6 days supply | Qty: 20 | Fill #0

## 2021-02-03 NOTE — ED Notes (Signed)
Pt reports neck pain since 3/26. Heat ,Ice , Ibuprofen has had minimal relief.Pain has been getting worse, gives her headache, cant sleep.

## 2021-02-03 NOTE — ED Triage Notes (Signed)
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

## 2021-02-03 NOTE — ED Provider Notes (Signed)
[facility]ERGENCY DEPARTMENT Provider Note   CSN: [id number] Arrival date & time: 02/03/21  1623     History Chief Complaint  Patient presents with  . Neck Pain    Miranda Taylor is a 60 y.o. female presenting to the ED for evaluation of gradual onset of bilateral neck pain began on Saturday.  She states she was painting a room in her house on Thursday and Friday, symptoms began the following day and were mild though gradually worsened throughout the following days.  She reports pain in her neck and tightness to the bilateral paracervical region, seems worse on the right.  Pain is worse with movement of her head.  She not having any numbness or weakness in her arms.  No particular injury.  No fevers or chills, no chest pain, no other associated symptoms.  She is treated with her prescribed Percocet, Flexeril, as well as topical Voltaren gel and ice and heat.  Presents for persisting pain.  She is followed by orthopedics, [facility], has not called for an appointment.  The history is provided by the patient.       Past Medical History:  Diagnosis Date  . Anxiety   . Arthritis    low back, degenerative spine   . Chronic lower back pain   . Depression   . Dysrhythmia 02/2015   afib  . GERD (gastroesophageal reflux disease)   . H/O hiatal hernia   . Heart murmur   . History of kidney stones   . Hypertension   . Migraines    h/o migraines   . Paroxysmal atrial fibrillation (HCC)   . Plantar fasciitis   . Pleural effusion on right 12/27/2015   loculated  . Pleural effusion on right   . S/P Minimally-invasive maze operation for atrial fibrillation 12/16/2015   Complete bilateral atrial lesion set using bipolar radiofrequency and cryothermy ablation with clipping of LA appendage via right mini thoracotomy approach  . 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 Sepsis ([place]) 12/2015  . Spinal stenosis   . SUI (stress urinary  incontinence, female)   . 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 TMJ (temporomandibular joint syndrome)    LEFT SIDE--  WEAR  MOUTH GUARD  . Typical atrial flutter [facility])     Patient Active Problem List   Diagnosis Date Noted  . Pleural effusion on right 12/27/2015  . Sepsis ([place]) 12/26/2015  . Pleural effusion 12/26/2015  . 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 Paroxysmal atrial fibrillation (HCC)   . Overweight 03/31/2015  . Chronic diastolic heart failure ([place]) 02/24/2015  . Atrial flutter with rapid ventricular response ([place]) 02/03/2015  . PAF (paroxysmal atrial fibrillation) ([place]) 02/03/2015  . Hyperglycemia 02/03/2015  . Family history of early CAD 10/15/2014  . Major depressive disorder, recurrent episode, moderate ([place]) 11/11/2013  . Anxiety and depression 08/26/2013  . Chronic lower back pain   . Fibromyalgia   . GERD (gastroesophageal reflux disease)   . DDD (degenerative disc disease), cervical   . UTI (lower urinary tract infection) 04/02/2012  . Migraines 04/02/2012    Past Surgical History:  Procedure Laterality Date  . ANTERIOR CERVICAL DECOMP/DISCECTOMY FUSION  04-08-2010   C4 -- C5  . APPENDECTOMY  1984  . BILATERAL THUNB JOINT ARTHRODESIS  left 2007/   right 2010  . CARDIOVASCULAR STRESS  TEST  04-27-2014   dr berry   normal perfusion study/  no ischemia/  ef 61%  . CHEST TUBE INSERTION Right 12/28/2015  . CLIPPING OF ATRIAL APPENDAGE N/A 12/16/2015   Procedure: CLIPPING OF ATRIAL APPENDAGE;  Surgeon: Rexene Alberts, MD;  Location: [facility];  Service: Open Heart Surgery;  Laterality: N/A;  . CYSTOSCOPY N/A 08/04/2014   Procedure: CYSTOSCOPY;  Surgeon: Ardis Hughs, MD;  Location: [facility];  Service: Urology;  Laterality: N/A;  . LASIK  1990's  . MINIMALLY INVASIVE EXCISION  OF ATRIAL MYXOMA Right 12/16/2015   Procedure: MINIMALLY INVASIVE RESECTION OF LEFT ATRIAL MASS;  Surgeon: Rexene Alberts, MD;  Location: [place];  Service: Open Heart Surgery;  Laterality: Right;  . MINIMALLY INVASIVE MAZE PROCEDURE N/A 12/16/2015   Procedure: MINIMALLY INVASIVE MAZE PROCEDURE;  Surgeon: Rexene Alberts, MD;  Location: [place];  Service: Open Heart Surgery;  Laterality: N/A;  . NEGATIVE SLEEP STUDY  04-15-2013  . POSTERIOR FUSION LUMBAR SPINE  09-16-2009   L4 -- L5  . PUBOVAGINAL SLING N/A 08/04/2014   Procedure: BOSTON SCIENTIFIC RETRO-PUBIC MID URETHRAL SLING;  Surgeon: Ardis Hughs, MD;  Location[facility];  Service: Urology;  Laterality: N/A;  . TEE WITHOUT CARDIOVERSION N/A 02/06/2015   Procedure: TRANSESOPHAGEAL ECHOCARDIOGRAM (TEE);  Surgeon: Lelon Perla, MD;  Location: [facility] ENDOSCOPY;  Service: Cardiovascular;  Laterality: N/A;  . TEE WITHOUT CARDIOVERSION N/A 05/14/2015   Procedure: TRANSESOPHAGEAL ECHOCARDIOGRAM (TEE);  Surgeon: Sanda Klein, MD;  Location: [facility] ENDOSCOPY;  Service: Cardiovascular;  Laterality: N/A;  . TEE WITHOUT CARDIOVERSION N/A 09/02/2015   Procedure: TRANSESOPHAGEAL ECHOCARDIOGRAM (TEE);  Surgeon: Sueanne Margarita, MD;  Location[facility] ENDOSCOPY;  Service: Cardiovascular;  Laterality: N/A;  . TEE WITHOUT CARDIOVERSION N/A 09/09/2015   Procedure: TRANSESOPHAGEAL ECHOCARDIOGRAM (TEE);  Surgeon: Lelon Perla, MD;  Location: [facility] ENDOSCOPY;  Service: Cardiovascular;  Laterality: N/A;  . TEE WITHOUT CARDIOVERSION N/A 12/16/2015   Procedure: TRANSESOPHAGEAL ECHOCARDIOGRAM (TEE);  Surgeon: Rexene Alberts, MD;  Location: [place];  Service: Open Heart Surgery;  Laterality: N/A;  . TRANSTHORACIC ECHOCARDIOGRAM  04-03-2012   normal LVF/  ef 55-60%     OB History   No obstetric history on file.     Family History  Problem Relation Age of Onset  . Heart attack Father 60  . CAD Father 84  . Arrhythmia Father   . Heart disease Mother   .  Hypertension Other     Social History   Tobacco Use  . Smoking status: Former Smoker    Packs/day: 0.50    Years: 20.00    Pack years: 10.00    Types: Cigarettes    Quit date: 11/07/2001    Years since quitting: 19.2  . Smokeless tobacco: Never Used  Vaping Use  . Vaping Use: Never used  Substance Use Topics  . Alcohol use: No  . Drug use: No    Home Medications Prior to Admission medications   Medication Sig Start Date End Date Taking? Authorizing Provider  cycloSPORINE (RESTASIS) 0.05 % ophthalmic emulsion Place 1 drop into both eyes 2 (two) times daily as needed (for dry eyes).    Yes [provider]  desvenlafaxine (PRISTIQ) 100 MG 24 hr tablet Take 100 mg by mouth daily.   Yes [provider]  LORazepam (ATIVAN) 0.5 MG tablet Take 0.5 mg by mouth at bedtime.    Yes [provider]  Multiple Vitamin ([place])  TABS Take 1 tablet by mouth daily.   Yes [provider]  NEXIUM 40 MG capsule Take 40 mg by mouth daily as needed (for heartburn).  06/25/12  Yes [provider]  oxyCODONE (OXY IR/ROXICODONE) 5 MG immediate release tablet Take 1-2 tablets (5-10 mg total) by mouth every 4 (four) hours as needed for severe pain. 01/01/16  Yes Lars Pinks M, PA-C  rizatriptan (MAXALT-MLT) 10 MG disintegrating tablet Take 10 mg by mouth daily as needed for migraine.  02/26/13  Yes [provider]  tiZANidine (ZANAFLEX) 2 MG tablet Take 1 tablet (2 mg total) by mouth every 8 (eight) hours as needed for muscle spasms. 02/03/21  Yes , [place] N, PA-C    Allergies    Patient has no known allergies.  Review of Systems   Review of Systems  Constitutional: Negative for fever.  Cardiovascular: Negative for chest pain.  Musculoskeletal: Positive for neck pain.  Neurological: Negative for weakness and numbness.    Physical Exam Updated Vital Signs BP 138/79 (BP Location: Left Arm)   Pulse 81   Temp 98.3 F  (36.8 C) (Oral)   Resp 16   Ht 5\' 7"  (1.702 m)   Wt 77.1 kg   LMP 12/25/2012   SpO2 97%   BMI 26.63 kg/m   Physical Exam Vitals and nursing note reviewed.  Constitutional:      Appearance: She is well-developed.  HENT:     Head: Normocephalic and atraumatic.  Eyes:     Conjunctiva/sclera: Conjunctivae normal.  Cardiovascular:     Rate and Rhythm: Normal rate.  Pulmonary:     Effort: Pulmonary effort is normal. No respiratory distress.     Breath sounds: Normal breath sounds.  Musculoskeletal:     Comments: TTP to bilateral paracervical musculature, no focal tenderness along the C-spine.  Palpable spasm is noted.  TTP extends down into the superior trapezius muscle region.  Patient unwilling to range the neck much secondary to tightness and discomfort.  Normal range of motion of the shoulders.  No skin changes.  Neurological:     Mental Status: She is alert.     Comments: Strong and equal grip strength to bilateral upper extremities, normal tone.  Normal sensation to light touch bilaterally.  Strong and equal radial pulses bilaterally.  Psychiatric:        Mood and Affect: Mood normal.        Behavior: Behavior normal.     ED Results / Procedures / Treatments   Labs (all labs ordered are listed, but only abnormal results are displayed) Labs Reviewed - No data to display  EKG None  Radiology No results found.  Procedures Procedures   Medications Ordered in ED Medications - No data to display  ED Course  I have reviewed the triage vital signs and the nursing notes.  Pertinent labs & imaging results that were available during my care of the patient were reviewed by me and considered in my medical decision making (see chart for details).    MDM Rules/Calculators/A&P                          Patient with muscle spasm to the neck after painting in her home.  No particular injury, gradual onset of muscle soreness and tightness.  Has been treating with her home  prescribed medications for symptom relief.  Followed by [facility].  Exam overall today is reassuring, no neuro symptoms or deficits.  Pain appears to be mostly within the muscle.  No trauma to cause concern for spinal injury.  Recommend continue supportive measures at home, schedule appointment with orthopedist.  Will prescribe tizanidine for relief of muscle spasm.  She is aware this is to be used in place of her Flexeril.  Return precautions discussed.  Patient discharged to home.   Final Clinical Impression(s) / ED Diagnoses Final diagnoses:  Muscle spasms of neck    Rx / DC Orders ED Discharge Orders         Ordered    tiZANidine (ZANAFLEX) 2 MG tablet  Every 8 hours PRN        02/03/21 1730           , [place] N, PA-C 02/03/21 1735    Arnaldo Natal, MD 02/03/21 2320

## 2021-02-03 NOTE — Discharge Instructions (Addendum)
You can try the tizanidine in place of your flexeril for relief from muscle spasms. Follow with your orthopedist regarding your persistent neck pains.

## 2021-02-04 ENCOUNTER — Ambulatory Visit: Payer: Medicare Other | Admitting: Orthopedic Surgery

## 2021-03-11 ENCOUNTER — Other Ambulatory Visit: Payer: Self-pay

## 2021-03-11 ENCOUNTER — Telehealth: Payer: Self-pay

## 2021-03-11 ENCOUNTER — Encounter: Payer: Self-pay | Admitting: Orthopedic Surgery

## 2021-03-11 ENCOUNTER — Ambulatory Visit (INDEPENDENT_AMBULATORY_CARE_PROVIDER_SITE_OTHER): Payer: Medicare Other | Admitting: Orthopedic Surgery

## 2021-03-11 DIAGNOSIS — M659 Synovitis and tenosynovitis, unspecified: Secondary | ICD-10-CM

## 2021-03-11 NOTE — Telephone Encounter (Signed)
Can we get patient approved for right knee gel injection? 

## 2021-03-11 NOTE — Progress Notes (Signed)
Office Visit Note   Patient: Miranda Taylor           Date of Birth: Nov 06, 1961           MRN: [id number] Visit Date: 03/11/2021 Requested by: Aletha Halim., PA-C [address],  [place] [address] PCP: Aletha Halim., PA-C  Subjective: Chief Complaint  Patient presents with  . Right Knee - Pain    HPI: Kendrea is a 60 year old patient with right knee pain.  She had an injection which only gave her about 3 days of relief.  She brings in an MRI scan from September 2021 for review.  She states her knee is hurting with significant amount almost as much as it was prior to her arthroscopic procedure several years ago.  MRI scan is reviewed with the patient.  Does not show any definite meniscal pathology but does show partial-thickness cartilage loss on the medial side.  With some chondromalacia in the patellofemoral joint as well.  Nothing definitively arthroscopically treatable in the right knee.              ROS: All systems reviewed are negative as they relate to the chief complaint within the history of present illness.  Patient denies  fevers or chills.   Assessment & Plan: Visit Diagnoses: No diagnosis found.  Plan: Impression is right knee pain with effusion and nothing really discretely treatable with arthroscopy.  No think her symptoms are bad enough yet for any type of partial versus complete knee replacement.  1 option would be to try aspiration and gel injection in 2 to 3 weeks.  We will get that set up for her and have her continue to do quad strengthening exercises and nonweightbearing exercises.  Follow-Up Instructions: Return in about 3 weeks (around 04/01/2021).   Orders:  No orders of the defined types were placed in this encounter.  No orders of the defined types were placed in this encounter.     Procedures: No procedures performed   Clinical Data: No additional findings.  Objective: Vital Signs: LMP 12/25/2012   Physical Exam:    Constitutional: Patient appears well-developed HEENT:  Head: Normocephalic Eyes:EOM are normal Neck: Normal range of motion Cardiovascular: Normal rate Pulmonary/chest: Effort normal Neurologic: Patient is alert Skin: Skin is warm Psychiatric: Patient has normal mood and affect    Ortho Exam: Ortho exam demonstrates normal gait alignment.  Mild effusion right knee no effusion left knee.  Collateral crucial ligaments are stable.  Extensor mechanism is intact.  Pedal pulses palpable.  No focal medial or lateral joint line tenderness.  Collateral crucial ligaments are stable.  Patient does have mild patellofemoral crepitus bilaterally.  Specialty Comments:  No specialty comments available.  Imaging: No results found.   PMFS History: Patient Active Problem List   Diagnosis Date Noted  . Pleural effusion on right 12/27/2015  . Sepsis ([place]) 12/26/2015  . Pleural effusion 12/26/2015  . 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 Paroxysmal atrial fibrillation (HCC)   . Overweight 03/31/2015  . Chronic diastolic heart failure ([place]) 02/24/2015  . Atrial flutter with rapid ventricular response ([place]) 02/03/2015  . PAF (paroxysmal atrial fibrillation) ([place]) 02/03/2015  . Hyperglycemia 02/03/2015  . Family history of early CAD 10/15/2014  . Major depressive disorder, recurrent episode, moderate ([place]) 11/11/2013  . Anxiety and depression 08/26/2013  . Chronic lower back  pain   . Fibromyalgia   . GERD (gastroesophageal reflux disease)   . DDD (degenerative disc disease), cervical   . UTI (lower urinary tract infection) 04/02/2012  . Migraines 04/02/2012   Past Medical History:  Diagnosis Date  . Anxiety   . Arthritis    low back, degenerative spine   . Chronic lower back pain   . Depression   . Dysrhythmia 02/2015    afib  . GERD (gastroesophageal reflux disease)   . H/O hiatal hernia   . Heart murmur   . History of kidney stones   . Hypertension   . Migraines    h/o migraines   . Paroxysmal atrial fibrillation (HCC)   . Plantar fasciitis   . Pleural effusion on right 12/27/2015   loculated  . Pleural effusion on right   . S/P Minimally-invasive maze operation for atrial fibrillation 12/16/2015   Complete bilateral atrial lesion set using bipolar radiofrequency and cryothermy ablation with clipping of LA appendage via right mini thoracotomy approach  . 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 Sepsis ([place]) 12/2015  . Spinal stenosis   . SUI (stress urinary incontinence, female)   . 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 TMJ (temporomandibular joint syndrome)    LEFT SIDE--  WEAR  MOUTH GUARD  . Typical atrial flutter (HCC)     Family History  Problem Relation Age of Onset  . Heart attack Father 39  . CAD Father 5  . Arrhythmia Father   . Heart disease Mother   . Hypertension Other     Past Surgical History:  Procedure Laterality Date  . ANTERIOR CERVICAL DECOMP/DISCECTOMY FUSION  04-08-2010   C4 -- C5  . APPENDECTOMY  1984  . BILATERAL THUNB JOINT ARTHRODESIS  left 2007/   right 2010  . CARDIOVASCULAR STRESS TEST  04-27-2014   dr berry   normal perfusion study/  no ischemia/  ef 61%  . CHEST TUBE INSERTION Right 12/28/2015  . CLIPPING OF ATRIAL APPENDAGE N/A 12/16/2015   Procedure: CLIPPING OF ATRIAL APPENDAGE;  Surgeon: Rexene Alberts, MD;  Location: [place];  Service: Open Heart Surgery;  Laterality: N/A;  . CYSTOSCOPY N/A 08/04/2014   Procedure: CYSTOSCOPY;  Surgeon: Ardis Hughs, MD;  Location: [facility];  Service: Urology;  Laterality: N/A;  . LASIK  1990's  . MINIMALLY INVASIVE EXCISION OF ATRIAL MYXOMA Right 12/16/2015   Procedure: MINIMALLY INVASIVE RESECTION OF LEFT ATRIAL MASS;  Surgeon: Rexene Alberts, MD;  Location:  [place];  Service: Open Heart Surgery;  Laterality: Right;  . MINIMALLY INVASIVE MAZE PROCEDURE N/A 12/16/2015   Procedure: MINIMALLY INVASIVE MAZE PROCEDURE;  Surgeon: Rexene Alberts, MD;  Location: [place];  Service: Open Heart Surgery;  Laterality: N/A;  . NEGATIVE SLEEP STUDY  04-15-2013  . POSTERIOR FUSION LUMBAR SPINE  09-16-2009   L4 -- L5  . PUBOVAGINAL SLING N/A 08/04/2014   Procedure: BOSTON SCIENTIFIC RETRO-PUBIC MID URETHRAL SLING;  Surgeon: Ardis Hughs, MD;  Location: [facility];  Service: Urology;  Laterality: N/A;  . TEE WITHOUT CARDIOVERSION N/A 02/06/2015   Procedure: TRANSESOPHAGEAL ECHOCARDIOGRAM (TEE);  Surgeon: Lelon Perla, MD;  Location: [facility]OSCOPY;  Service: Cardiovascular;  Laterality: N/A;  . TEE WITHOUT CARDIOVERSION N/A 05/14/2015   Procedure: TRANSESOPHAGEAL ECHOCARDIOGRAM (TEE);  Surgeon: Sanda Klein, MD;  Location: [place];  Service: Cardiovascular;  Laterality: N/A;  . TEE WITHOUT CARDIOVERSION N/A 09/02/2015   Procedure:  TRANSESOPHAGEAL ECHOCARDIOGRAM (TEE);  Surgeon: Sueanne Margarita, MD;  Location: [facility] ENDOSCOPY;  Service: Cardiovascular;  Laterality: N/A;  . TEE WITHOUT CARDIOVERSION N/A 09/09/2015   Procedure: TRANSESOPHAGEAL ECHOCARDIOGRAM (TEE);  Surgeon: Lelon Perla, MD;  Location: [facility] ENDOSCOPY;  Service: Cardiovascular;  Laterality: N/A;  . TEE WITHOUT CARDIOVERSION N/A 12/16/2015   Procedure: TRANSESOPHAGEAL ECHOCARDIOGRAM (TEE);  Surgeon: Rexene Alberts, MD;  Location: [place];  Service: Open Heart Surgery;  Laterality: N/A;  . TRANSTHORACIC ECHOCARDIOGRAM  04-03-2012   normal LVF/  ef 55-60%   Social History   Occupational History  . Occupation: Disabled  Tobacco Use  . Smoking status: Former Smoker    Packs/day: 0.50    Years: 20.00    Pack years: 10.00    Types: Cigarettes    Quit date: 11/07/2001    Years since quitting: 19.3  . Smokeless tobacco: Never Used  Vaping Use  . Vaping Use: Never used  Substance and  Sexual Activity  . Alcohol use: No  . Drug use: No  . Sexual activity: Not on file

## 2021-03-12 NOTE — Progress Notes (Signed)
Noted  

## 2021-03-12 NOTE — Telephone Encounter (Signed)
Noted  

## 2021-03-22 ENCOUNTER — Telehealth: Payer: Self-pay

## 2021-03-22 NOTE — Telephone Encounter (Signed)
Patient called regarding pre approval for gel injection she stated her insurance told her they haven't received anything from [place] call back:[phone]

## 2021-03-23 ENCOUNTER — Telehealth: Payer: Self-pay

## 2021-03-23 NOTE — Telephone Encounter (Signed)
VOB has been submitted for SynviscOne, right knee.  

## 2021-03-23 NOTE — Telephone Encounter (Signed)
Approved for SynivscOne, right knee. [place] Patient will be responsible for 20% OOP. Co-pay of $25.00 No PA required

## 2021-03-23 NOTE — Telephone Encounter (Signed)
Called and left a VM concerning gel injection approval. 

## 2021-04-12 ENCOUNTER — Telehealth: Payer: Self-pay

## 2021-04-12 NOTE — Telephone Encounter (Signed)
Pt called and wants to schedule gel injection. I seen approval but didn't know if it was still good. Cb [phone]

## 2021-04-13 ENCOUNTER — Telehealth: Payer: Self-pay | Admitting: Orthopedic Surgery

## 2021-04-13 NOTE — Telephone Encounter (Signed)
Called and left a VM advising patient to CB to schedule appointment for gel injection with Dr. Marlou Sa.

## 2021-04-13 NOTE — Telephone Encounter (Signed)
Pt called to set her gel inj appt and it says she will have a $25 co pay but she states she has two insurances and wanted to know if she would still have a copay? Also she wanted to verify that she will be able to drive herself home.   [phone]

## 2021-04-13 NOTE — Telephone Encounter (Signed)
Called and left a VM concerning insurance and being able to drive home.

## 2021-04-23 ENCOUNTER — Encounter: Payer: Self-pay | Admitting: Orthopedic Surgery

## 2021-04-23 ENCOUNTER — Ambulatory Visit (INDEPENDENT_AMBULATORY_CARE_PROVIDER_SITE_OTHER): Payer: Medicare Other | Admitting: Orthopedic Surgery

## 2021-04-23 DIAGNOSIS — M659 Synovitis and tenosynovitis, unspecified: Secondary | ICD-10-CM

## 2021-04-23 DIAGNOSIS — M1711 Unilateral primary osteoarthritis, right knee: Secondary | ICD-10-CM

## 2021-04-23 MED ORDER — LIDOCAINE HCL 1 % IJ SOLN
5.0000 mL | INTRAMUSCULAR | Status: AC | PRN
  Administered 2021-04-23: 5 mL

## 2021-04-23 MED ORDER — HYLAN G-F 20 48 MG/6ML IX SOSY
48.0000 mg | PREFILLED_SYRINGE | INTRA_ARTICULAR | Status: AC | PRN
  Administered 2021-04-23: 48 mg via INTRA_ARTICULAR

## 2021-04-23 NOTE — Progress Notes (Signed)
   Procedure Note  Patient: Miranda Taylor             Date of Birth: 06-07-1961           MRN: [id number]             Visit Date: 04/23/2021  Procedures: Visit Diagnoses:  1. Synovitis of both knee joints     Large Joint Inj: R knee on 04/23/2021 10:01 AM Indications: pain, joint swelling and diagnostic evaluation Details: 18 G 1.5 in needle, superolateral approach  Arthrogram: No  Medications: 5 mL lidocaine 1 %; 48 mg Hylan 48 MG/6ML Outcome: tolerated well, no immediate complications Procedure, treatment alternatives, risks and benefits explained, specific risks discussed. Consent was given by the patient. Immediately prior to procedure a time out was called to verify the correct patient, procedure, equipment, support staff and site/side marked as required. Patient was prepped and draped in the usual sterile fashion.

## 2021-05-04 ENCOUNTER — Ambulatory Visit: Payer: Medicare Other | Admitting: Cardiovascular Disease

## 2021-07-07 ENCOUNTER — Ambulatory Visit: Payer: Medicare Other | Admitting: Medical

## 2021-09-01 ENCOUNTER — Other Ambulatory Visit: Payer: Self-pay

## 2021-09-01 ENCOUNTER — Ambulatory Visit (INDEPENDENT_AMBULATORY_CARE_PROVIDER_SITE_OTHER): Payer: BC Managed Care – PPO | Admitting: Medical

## 2021-09-01 ENCOUNTER — Encounter: Payer: Self-pay | Admitting: Medical

## 2021-09-01 VITALS — BP 142/80 | HR 64 | Ht 67.0 in | Wt 182.2 lb

## 2021-09-01 DIAGNOSIS — I480 Paroxysmal atrial fibrillation: Secondary | ICD-10-CM | POA: Diagnosis not present

## 2021-09-01 DIAGNOSIS — R030 Elevated blood-pressure reading, without diagnosis of hypertension: Secondary | ICD-10-CM

## 2021-09-01 DIAGNOSIS — I5042 Chronic combined systolic (congestive) and diastolic (congestive) heart failure: Secondary | ICD-10-CM

## 2021-09-01 DIAGNOSIS — D151 Benign neoplasm of heart: Secondary | ICD-10-CM | POA: Diagnosis not present

## 2021-09-01 NOTE — Patient Instructions (Signed)
Medication Instructions:  Continue current medications  *If you need a refill on your cardiac medications before your next appointment, please call your pharmacy*   Lab Work: None Ordered   Testing/Procedures: None Ordered   Follow-Up: At [facility], you and your health needs are our priority.  As part of our continuing mission to provide you with exceptional heart care, we have created designated Provider Care Teams.  These Care Teams include your primary Cardiologist (physician) and Advanced Practice Providers (APPs -  Physician Assistants and Nurse Practitioners) who all work together to provide you with the care you need, when you need it.  We recommend signing up for the patient portal called "MyChart".  Sign up information is provided on this After Visit Summary.  MyChart is used to connect with patients for Virtual Visits (Telemedicine).  Patients are able to view lab/test results, encounter notes, upcoming appointments, etc.  Non-urgent messages can be sent to your provider as well.   To learn more about what you can do with MyChart, go to [email or web address].    Your next appointment:   1 year(s)  The format for your next appointment:   In Person  Provider:   You may see Quay Burow, MD or one of the following Advanced Practice Providers on your designated Care Team:   [place], PA-C Coletta Memos, FNP   Other Instructions Keep daily blood pressure check

## 2021-09-01 NOTE — Progress Notes (Signed)
Cardiology Office Note   Date:  09/01/2021   ID:  Ciella, Obi [birth year], MRN [id number]  PCP:  Aletha Halim., PA-C  Cardiologist:  Quay Burow, MD EP: None  Chief Complaint  Patient presents with   Follow-up    PAF, CHF, LA myxoma s/p resection       History of Present Illness: JENET DURIO is a 60 y.o. 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  She was last evaluated by cardiology at an outpatient visit with Dr. Alvester Chou 04/2020 at which time she was doing well from a cardiac standpoint.  No clear recurrent episodes of atrial fibrillation while off antiarrhythmics.  No anginal complaints.  No medication changes occurred and she was recommended to follow-up in 1 year.  Her last echocardiogram in 2019 showed EF 55-60%, no RWMA, mild AI, and moderate TR.  Her last ischemic evaluation was an LHC in 2017 which showed normal coronary arteries.  She presents today for routine follow-up.  She has been doing well over the past year.  She has no cardiac complaints today.  No issues with chest pain, shortness of breath, DOE, palpitations, dizziness, lightheadedness, syncope, orthopnea, PND, lower extremity edema.  BP is mildly elevated today.  She notes SBP typically in the 120s-130s at home.  She reports she did have [place] food for lunch yesterday.  We discussed maintaining a low-salt diet and getting regular exercise.  She has been staying busy caring for a 56-year-old foster child.   Past Medical History:  Diagnosis Date   Anxiety    Arthritis    low back, degenerative spine    Chronic lower back pain    Depression    Dysrhythmia 02/2015   afib   GERD (gastroesophageal reflux disease)    H/O hiatal hernia    Heart murmur    History of kidney stones    Hypertension    Migraines    h/o migraines    Paroxysmal atrial  fibrillation (HCC)    Plantar fasciitis    Pleural effusion on right 12/27/2015   loculated   Pleural effusion on right    S/P Minimally-invasive maze operation for atrial fibrillation 12/16/2015   Complete bilateral atrial lesion set using bipolar radiofrequency and cryothermy ablation with clipping of LA appendage via right mini thoracotomy approach   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   Sepsis ([place]) 12/2015   Spinal stenosis    SUI (stress urinary incontinence, female)    Thrombus of left atrial appendage 4/16   TMJ (temporomandibular joint syndrome)    LEFT SIDE--  WEAR  MOUTH GUARD   Typical atrial flutter ([place])     Past Surgical History:  Procedure Laterality Date   ANTERIOR CERVICAL DECOMP/DISCECTOMY FUSION  04-08-2010   C4 -- C5   APPENDECTOMY  1984   BILATERAL THUNB JOINT ARTHRODESIS  left 2007/   right 2010   CARDIOVASCULAR STRESS TEST  04-27-2014   dr berry   normal perfusion study/  no ischemia/  ef 61%   CHEST TUBE INSERTION Right 12/28/2015   CLIPPING OF ATRIAL APPENDAGE N/A 12/16/2015   Procedure: CLIPPING OF ATRIAL APPENDAGE;  Surgeon: Rexene Alberts, MD;  Location: [facility];  Service: Open Heart Surgery;  Laterality: N/A;   CYSTOSCOPY N/A 08/04/2014   Procedure: CYSTOSCOPY;  Surgeon: Ardis Hughs, MD;  Location: [facility];  Service: Urology;  Laterality: N/A;   LASIK  1990's   MINIMALLY INVASIVE EXCISION OF ATRIAL MYXOMA Right 12/16/2015   Procedure: MINIMALLY INVASIVE RESECTION OF LEFT ATRIAL MASS;  Surgeon: Rexene Alberts, MD;  Location: [facility];  Service: Open Heart Surgery;  Laterality: Right;   MINIMALLY INVASIVE MAZE PROCEDURE N/A 12/16/2015   Procedure: MINIMALLY INVASIVE MAZE PROCEDURE;  Surgeon: Rexene Alberts, MD;  Location: [facility];  Service: Open Heart Surgery;  Laterality: N/A;   NEGATIVE SLEEP STUDY  04-15-2013   POSTERIOR FUSION LUMBAR SPINE  09-16-2009   L4 -- L5   PUBOVAGINAL SLING N/A 08/04/2014    Procedure: BOSTON SCIENTIFIC RETRO-PUBIC MID URETHRAL SLING;  Surgeon: Ardis Hughs, MD;  Location: [facility];  Service: Urology;  Laterality: N/A;   TEE WITHOUT CARDIOVERSION N/A 02/06/2015   Procedure: TRANSESOPHAGEAL ECHOCARDIOGRAM (TEE);  Surgeon: Lelon Perla, MD;  Location: [facility] ENDOSCOPY;  Service: Cardiovascular;  Laterality: N/A;   TEE WITHOUT CARDIOVERSION N/A 05/14/2015   Procedure: TRANSESOPHAGEAL ECHOCARDIOGRAM (TEE);  Surgeon: Sanda Klein, MD;  [facility] ENDOSCOPY;  Service: Cardiovascular;  Laterality: N/A;   TEE WITHOUT CARDIOVERSION N/A 09/02/2015   Procedure: TRANSESOPHAGEAL ECHOCARDIOGRAM (TEE);  Surgeon: Sueanne Margarita, MD;  [facility];  Service: Cardiovascular;  Laterality: N/A;   TEE WITHOUT CARDIOVERSION N/A 09/09/2015   Procedure: TRANSESOPHAGEAL ECHOCARDIOGRAM (TEE);  Surgeon: Lelon Perla, MD;  Location: [facility] ENDOSCOPY;  Service: Cardiovascular;  Laterality: N/A;   TEE WITHOUT CARDIOVERSION N/A 12/16/2015   Procedure: TRANSESOPHAGEAL ECHOCARDIOGRAM (TEE);  Surgeon: Rexene Alberts, MD;  Location: [facility];  Service: Open Heart Surgery;  Laterality: N/A;   TRANSTHORACIC ECHOCARDIOGRAM  04-03-2012   normal LVF/  ef 55-60%     Current Outpatient Medications  Medication Sig Dispense Refill   cycloSPORINE (RESTASIS) 0.05 % ophthalmic emulsion Place 1 drop into both eyes 2 (two) times daily as needed (for dry eyes).      desvenlafaxine (PRISTIQ) 100 MG 24 hr tablet Take 100 mg by mouth daily.     LORazepam (ATIVAN) 0.5 MG tablet Take 0.5 mg by mouth at bedtime.      Multiple Vitamin (MULITIVITAMIN WITH MINERALS) TABS Take 1 tablet by mouth daily.     NEXIUM 40 MG capsule Take 40 mg by mouth daily as needed (for heartburn).      oxyCODONE (OXY IR/ROXICODONE) 5 MG immediate release tablet Take 1-2 tablets (5-10 mg total) by mouth every 4 (four) hours as needed for severe pain. 14 tablet 0   rizatriptan (MAXALT-MLT) 10 MG disintegrating  tablet Take 10 mg by mouth daily as needed for migraine.      No current facility-administered medications for this visit.    Allergies:   Patient has no known allergies.    Social History:  The patient  reports that she quit smoking about 19 years ago. Her smoking use included cigarettes. She has a 10.00 pack-year smoking history. She has never used smokeless tobacco. She reports that she does not drink alcohol and does not use drugs.   Family History:  The patient's family history includes Arrhythmia in her father; CAD (age of onset: 35) in her father; Heart attack (age of onset: 90) in her father; Heart disease in her mother; Hypertension in an other family member.    ROS:  Please see the history of present illness.   Otherwise, review of systems are positive for none.   All other systems are reviewed and  negative.    PHYSICAL EXAM: VS:  BP (!) 142/80   Pulse 64   Ht 5\' 7"  (1.702 m)   Wt 182 lb 3.2 oz (82.6 kg)   LMP 12/25/2012   SpO2 97%   BMI 28.54 kg/m  , BMI Body mass index is 28.54 kg/m. GEN: Well nourished, well developed, in no acute distress HEENT: Sclera anicteric Neck: no JVD, carotid bruits, or masses Cardiac: RRR; no murmurs, rubs, or gallops, no edema  Respiratory:  clear to auscultation bilaterally, normal work of breathing GI: soft, nontender, nondistended, + BS MS: no deformity or atrophy Skin: warm and dry, no rash Neuro:  Strength and sensation are intact Psych: euthymic mood, full affect   EKG:  EKG is ordered today. The ekg ordered today demonstrates sinus rhythm, rate 64 bpm, no STE/D, no significant change from previous   Recent Labs: No results found for requested labs within last 8760 hours.    Lipid Panel    Component Value Date/Time   CHOL 180 10/15/2014 0939   TRIG 58 10/15/2014 0939   HDL 62 10/15/2014 0939   CHOLHDL 2.9 10/15/2014 0939   VLDL 12 10/15/2014 0939   LDLCALC 106 (H) 10/15/2014 0939      Wt Readings from Last 3  Encounters:  09/01/21 182 lb 3.2 oz (82.6 kg)  02/03/21 170 lb (77.1 kg)  05/05/20 189 lb (85.7 kg)      Other studies Reviewed: Additional studies/ records that were reviewed today include:   Echocardiogram 2019: - HPI and indications: S/p minimally invasive resection of LA    papillary fibroelastoma and MAZE procedure.  - Left ventricle: The cavity size was normal. There was mild focal    basal hypertrophy of the septum. Systolic function was normal.    The estimated ejection fraction was in the range of 55% to 60%.    Wall motion was normal; there were no regional wall motion    abnormalities.  - Aortic valve: There was mild regurgitation.  - Mitral valve: Calcified annulus.  - Left atrium: Normal post fibroelastoma resection.  - Tricuspid valve: There was moderate regurgitation.  - Pulmonary arteries: Systolic pressure was mildly increased. PA    peak pressure: 31 mm Hg (S).   [place] 2017: History obtained from chart review. Miss Gatling is a 60 year old married Caucasian female with a left atrial mass thought to be a myxoma. She has paroxysmal A. Fib on Eliquis. She is scheduled for surgical resection of this in the upcoming future. She presents for coronary angiography to define her coronary anatomy prior to her open heart procedure.   IMPRESSION:Mrs. Farone has normal coronary arteries. Her procedure was performed radially. The sheath was removed and a TR band was placed on the right wrist to achieve patent hemostasis. The patient left the lab in stable condition. She'll be discharged home today as an outpatient and will follow-up with Dr. Roxy Manns for her planned surgical resection of her left atrial mass.    ASSESSMENT AND PLAN:   1. Paroxysmal atrial fibrillation: no clear recurrence. No longer on eliquis following MAZE in 2017.  - Continue to monitor for recurrence  2. Chronic combined CHF: Was down to 35-40% back in 2016 with quick recovery to 55-60% on echo a few months  later.  Continued to be normal at 55-60% on last echo in 2019.  No symptoms to suggest this has been a recurring issue. - Encouraged ongoing heart healthy diet with low salt intake  3. LA myxoma  s/p resection: Occurred in 2017.  Subsequent echo in 2019 look good.  4.  Elevated blood pressure without history of hypertension: BP elevated to 142/80 today.  Generally with SBP in the 120s-130s at home.  She did have [place] food for lunch yesterday. - Recommended ongoing monitoring at home with plans to notify the office if consistently > 130/80 - Encourage low-salt diet   Current medicines are reviewed at length with the patient today.  The patient does not have concerns regarding medicines.  The following changes have been made:  As above  Labs/ tests ordered today include:   Orders Placed This Encounter  Procedures   EKG 12-Lead     Disposition:   FU with Dr. Gwenlyn Found in 1 year  Signed, Abigail Butts, PA-C  09/01/2021 10:19 AM

## 2022-04-06 ENCOUNTER — Ambulatory Visit (INDEPENDENT_AMBULATORY_CARE_PROVIDER_SITE_OTHER): Payer: Medicare Other

## 2022-04-06 ENCOUNTER — Ambulatory Visit (INDEPENDENT_AMBULATORY_CARE_PROVIDER_SITE_OTHER): Payer: Medicare Other | Admitting: Podiatry

## 2022-04-06 DIAGNOSIS — M779 Enthesopathy, unspecified: Secondary | ICD-10-CM

## 2022-04-06 DIAGNOSIS — M778 Other enthesopathies, not elsewhere classified: Secondary | ICD-10-CM | POA: Diagnosis not present

## 2022-04-06 DIAGNOSIS — M2042 Other hammer toe(s) (acquired), left foot: Secondary | ICD-10-CM | POA: Diagnosis not present

## 2022-04-06 MED ORDER — TRIAMCINOLONE ACETONIDE 10 MG/ML IJ SUSP
20.0000 mg | Freq: Once | INTRAMUSCULAR | Status: AC
  Administered 2022-04-06: 20 mg

## 2022-04-06 NOTE — Progress Notes (Signed)
Subjective:   Patient ID: Miranda Taylor, female   DOB: 61 y.o.   MRN: [id number]   HPI Patient presents stating she gets a lot of pain on top of both feet that is been present for around 6 months and she is having a lot of problems with the second toe of her left foot with a lesion and inability to wear shoe gear comfortably.  Patient states she is tried cushioning padding wider shoes without relief of symptoms and the pain in the top of her feet have gotten worse over this.  Does not smoke likes to be active   Review of Systems  All other systems reviewed and are negative.      Objective:  Physical Exam Vitals and nursing note reviewed.  Constitutional:      Appearance: She is well-developed.  Pulmonary:     Effort: Pulmonary effort is normal.  Musculoskeletal:        General: Normal range of motion.  Skin:    General: Skin is warm.  Neurological:     Mental Status: She is alert.    Neurovascular status found to be intact muscle strength found to be adequate range of motion within normal limits.  Patient is noted to have discomfort and pain of the dorsal of both feet in the extensor complex midtarsal joint area with inflammation and has a keratotic lesion second digit left distal and also complains of moderate discomfort in the sinus tarsi ankle region bilateral     Assessment:  Probability for moderate midtarsal joint arthritis with inflammation of the extensor complex bilateral along with possible compensatory sinus tarsitis inflammation left over right and hammertoe deformity second digit left distal painful eft H&P reviewed all conditions and x-rays.  At this point I went ahead and I did do sterile prep and injected the extensor complex bilateral 3 mg dexamethasone Kenalog 5 mg Xylocaine advised on topical medicines heat ice therapy and discussed the toe if she wants it corrected I recommended distal arthroplasty she wants this done I explained procedure and risk and she wants  surgery and at this point I had her read and then signed consent form for surgery understanding risk and recovery.  Scheduled for outpatient surgery all questions answered and patient will be seen back surgical center again encouraged to call with questions  X-rays indicate there is moderate arthritis of the inner phalangeal joint left second toe and moderate arthritis in the midtarsal joint bilateral     Plan:  Reviewed everything above as far as planned goes

## 2022-04-07 ENCOUNTER — Telehealth: Payer: Self-pay | Admitting: Urology

## 2022-04-07 NOTE — Telephone Encounter (Signed)
DOS - 04/12/22  HAMMERTOE REPAIR 2ND LEFT --- [id number]  [employer] EFFECTIVE DATE - 07/04/21  SPOKE WITH KAILEY B. WITH [employer] AND SHE STATED THAT FOR CPT CODE 30051 NO PRIOR AUTH IS REQUIRED.   REF # [id number]

## 2022-04-12 ENCOUNTER — Encounter: Payer: Self-pay | Admitting: Podiatry

## 2022-04-12 DIAGNOSIS — M2042 Other hammer toe(s) (acquired), left foot: Secondary | ICD-10-CM | POA: Diagnosis not present

## 2022-04-12 DIAGNOSIS — M65872 Other synovitis and tenosynovitis, left ankle and foot: Secondary | ICD-10-CM | POA: Diagnosis not present

## 2022-04-18 ENCOUNTER — Ambulatory Visit (INDEPENDENT_AMBULATORY_CARE_PROVIDER_SITE_OTHER): Payer: BC Managed Care – PPO

## 2022-04-18 ENCOUNTER — Ambulatory Visit (INDEPENDENT_AMBULATORY_CARE_PROVIDER_SITE_OTHER): Payer: BC Managed Care – PPO | Admitting: Podiatry

## 2022-04-18 ENCOUNTER — Encounter: Payer: Self-pay | Admitting: Podiatry

## 2022-04-18 DIAGNOSIS — M2042 Other hammer toe(s) (acquired), left foot: Secondary | ICD-10-CM

## 2022-04-18 NOTE — Progress Notes (Signed)
Subjective:   Patient ID: Miranda Taylor, female   DOB: 61 y.o.   MRN: [id number]   HPI Patient states doing very well with surgery   ROS      Objective:  Physical Exam  Neurovascular status intact negative Bevelyn Buckles' sign noted second digit excellent alignment wound edges well coapted good position     Assessment:  Doing well post distal arthroplasty digit to left     Plan:  X-ray reviewed sterile dressing reapplied and instructed on return in 2 weeks for stitch removal and will not need to be seen by me after that.  Encouraged her to wear open toed shoes with gradual return to soft shoe gears over the next few weeks  X-rays indicate satisfactory resection of the head of the middle phalanx digit to left good alignment noted

## 2022-05-02 ENCOUNTER — Other Ambulatory Visit: Payer: BC Managed Care – PPO

## 2022-05-04 ENCOUNTER — Ambulatory Visit (INDEPENDENT_AMBULATORY_CARE_PROVIDER_SITE_OTHER): Payer: BC Managed Care – PPO | Admitting: Podiatry

## 2022-05-04 DIAGNOSIS — M2042 Other hammer toe(s) (acquired), left foot: Secondary | ICD-10-CM

## 2022-05-04 NOTE — Progress Notes (Signed)
Patient seen today for removal of stitches to second digit to left foot. Stitches removed. Patient tolerated procedure. Nurse cleaned toe with betadine and covered with gauze and coflex. Patient stated she had tenderness to second digit. Nurse clarified that the tenderness should subside after the removal of the stiches.  Nurse clarified that she can call the office for any concerns. If she starts to experience any signs or symptoms of infection to go to the ED.

## 2022-05-16 ENCOUNTER — Telehealth: Payer: Self-pay | Admitting: *Deleted

## 2022-05-16 NOTE — Telephone Encounter (Signed)
"  I had Hammer toe surgery the first of June.  I got my stitches out about two weeks ago.  The toe is kind of red and swollen.  It just doesn't look right.  Calling to see what I need to do.  Dr. Paulla Dolly did the surgery."

## 2022-05-16 NOTE — Telephone Encounter (Signed)
Should come in on monday

## 2022-05-17 ENCOUNTER — Telehealth: Payer: Self-pay | Admitting: *Deleted

## 2022-05-17 NOTE — Telephone Encounter (Signed)
Patient is calling back with concerns about her procedural toe that is swollen after stitches were removed. Called patient to schedule for upcoming appointment per physician's note, no answer, left message to call back to schedule.

## 2022-05-18 ENCOUNTER — Ambulatory Visit: Payer: BC Managed Care – PPO

## 2022-05-18 ENCOUNTER — Ambulatory Visit (INDEPENDENT_AMBULATORY_CARE_PROVIDER_SITE_OTHER): Payer: BC Managed Care – PPO | Admitting: Podiatry

## 2022-05-18 ENCOUNTER — Encounter: Payer: Self-pay | Admitting: Podiatry

## 2022-05-18 ENCOUNTER — Ambulatory Visit (INDEPENDENT_AMBULATORY_CARE_PROVIDER_SITE_OTHER): Payer: BC Managed Care – PPO

## 2022-05-18 DIAGNOSIS — M79675 Pain in left toe(s): Secondary | ICD-10-CM

## 2022-05-18 DIAGNOSIS — G8929 Other chronic pain: Secondary | ICD-10-CM | POA: Diagnosis not present

## 2022-05-18 DIAGNOSIS — M79674 Pain in right toe(s): Secondary | ICD-10-CM

## 2022-05-19 NOTE — Progress Notes (Signed)
Subjective:   Patient ID: Miranda Taylor, female   DOB: 62 y.o.   MRN: [id number]   HPI Patient concerns with some redness of the left second toe stating it is gotten some better recently but has been bright red in the last 2 weeks   ROS      Objective:  Physical Exam  Neurovascular status intact negative Bevelyn Buckles' sign noted the left second digit now is healing well there is no drainage noted mild swelling no erythema noted and no proximal edema erythema drainage noted     Assessment:  Most likely a trauma reaction as she did bump her toe which created an inflammatory response     Plan:  Explained the difference between an inflammatory response and infection and I do think at this point it is probably inflammatory and I have recommended just watching it and wearing open toed shoes wide toe boxes.  Patient's discharge will be seen back if any issues were to occur or change  X-rays indicate that there is satisfactory resection head of middle phalanx left second toe no other pathology

## 2022-05-23 ENCOUNTER — Ambulatory Visit: Payer: BC Managed Care – PPO | Admitting: Podiatry

## 2022-06-06 ENCOUNTER — Other Ambulatory Visit: Payer: Self-pay

## 2022-09-08 ENCOUNTER — Telehealth: Payer: Self-pay | Admitting: Orthopedic Surgery

## 2022-09-08 NOTE — Telephone Encounter (Signed)
Patient called asked if she can get the gel injection?  The number to contact patient is [phone]

## 2022-09-09 NOTE — Telephone Encounter (Signed)
Submit

## 2022-09-13 NOTE — Telephone Encounter (Signed)
VOB submitted for [employer], right knee.  

## 2022-09-27 ENCOUNTER — Telehealth: Payer: Self-pay

## 2022-09-27 NOTE — Telephone Encounter (Signed)
VOB resubmitted for [employer], right knee due to insurance issue.

## 2022-10-17 ENCOUNTER — Telehealth: Payer: Self-pay | Admitting: Orthopedic Surgery

## 2022-10-17 NOTE — Telephone Encounter (Signed)
Patient called would like the status of her gel injections. Her call back nu mber is [phone]

## 2022-10-20 NOTE — Telephone Encounter (Signed)
Talked with patient concerning gel injection.  Advised patient that I have reached out to [employer] concerning her benefits for gel injection and will give her a call when I have some updated information.  Patient voiced that she understands

## 2022-10-26 ENCOUNTER — Encounter: Payer: Self-pay | Admitting: Cardiovascular Disease

## 2022-10-26 ENCOUNTER — Ambulatory Visit: Payer: BC Managed Care – PPO | Attending: Cardiovascular Disease | Admitting: Cardiovascular Disease

## 2022-10-26 VITALS — BP 130/76 | HR 70 | Ht 67.0 in | Wt 187.0 lb

## 2022-10-26 DIAGNOSIS — I480 Paroxysmal atrial fibrillation: Secondary | ICD-10-CM | POA: Diagnosis not present

## 2022-10-26 DIAGNOSIS — R030 Elevated blood-pressure reading, without diagnosis of hypertension: Secondary | ICD-10-CM

## 2022-10-26 LAB — HEPATIC FUNCTION PANEL
ALT: 10 IU/L (ref 0–32)
AST: 15 IU/L (ref 0–40)
Albumin: 3.7 g/dL — ABNORMAL LOW (ref 3.9–4.9)
Alkaline Phosphatase: 118 IU/L (ref 44–121)
Bilirubin Total: 0.6 mg/dL (ref 0.0–1.2)
Bilirubin, Direct: 0.19 mg/dL (ref 0.00–0.40)
Total Protein: 6.7 g/dL (ref 6.0–8.5)

## 2022-10-26 LAB — LIPID PANEL
Chol/HDL Ratio: 2.8 ratio (ref 0.0–4.4)
Cholesterol, Total: 175 mg/dL (ref 100–199)
HDL: 63 mg/dL (ref >39)
LDL Chol Calc (NIH): 101 mg/dL — ABNORMAL HIGH (ref 0–99)
Triglycerides: 53 mg/dL (ref 0–149)
VLDL Cholesterol Cal: 11 mg/dL (ref 5–40)

## 2022-10-26 NOTE — Progress Notes (Signed)
10/26/2022 LOGAN VEGH   1961-07-30  [id number]  Primary Physician Aletha Halim., PA-C Primary Cardiologist: Lorretta Harp MD FACP, [place], [place], [place]  HPI:  Miranda Taylor is a 61 y.o.  mildly overweight married Caucasian female, mother of 4 and grandmother to 7 grandchildren, whom I last saw in the office   05/05/2020.Marland Kitchen She has a history of symptomatic paroxysmal atrial fibrillation with a low [place] score on beta-blocker and aspirin. She had a normal 2D echo and stress test. I did an event monitor that showed multiple episodes of PAF, which she is symptomatic from with complaints of shortness of breath and fatigue. I referred her to Dr. Thompson Grayer at [facility], Electrophysiologist, who placed her on flecainide 50 p.o. b.i.d. empirically. Her symptoms have improved.she had symptoms compatible with obstructive sleep apnea when I last saw her 2 years ago and a subsequent outpatient sleep study did not suggest this. She was recently admitted to the hospital 02/03/15 for one week with A. Fib with RVR. A transesophageal echo showed reduced systolic function with 31-49% range with left atrial smoke/thrombus and therefore cardioversion was not performed. She was placed on amiodarone and rate controlled and placed on oral anticoagulation. She subsequently converted o normal sinus rhythm. 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 Roxy Manns which was performed in March 2019. Her postoperative echo revealed normal LV function. She feels clinically improved.    Since I saw her in the office 2-1/2 years ago she is remained stable.  She has had no recurrent A-fib.  She is not on any anticoagulant or antiarrhythmic medication.  She denies chest pain or shortness of breath.   Current Meds  Medication Sig   desvenlafaxine (PRISTIQ) 100 MG 24 hr tablet Take 100  mg by mouth daily.   LORazepam (ATIVAN) 1 MG tablet Take 1 tablet (1 mg total) by mouth 2 times daily.   Multiple Vitamin (MULITIVITAMIN WITH MINERALS) TABS Take 1 tablet by mouth daily.   NEXIUM 40 MG capsule Take 40 mg by mouth daily as needed (for heartburn).    oxyCODONE (OXY IR/ROXICODONE) 5 MG immediate release tablet Take 1-2 tablets (5-10 mg total) by mouth every 4 (four) hours as needed for severe pain.   rizatriptan (MAXALT-MLT) 10 MG disintegrating tablet Take 10 mg by mouth daily as needed for migraine.      No Known Allergies  Social History   Socioeconomic History   Marital status: Married    Spouse name: Not on file   Number of children: Not on file   Years of education: Not on file   Highest education level: Not on file  Occupational History   Occupation: Disabled  Tobacco Use   Smoking status: Former    Packs/day: 0.50    Years: 20.00    Total pack years: 10.00    Types: Cigarettes    Quit date: 11/07/2001    Years since quitting: 20.9   Smokeless tobacco: Never  Vaping Use   Vaping Use: Never used  Substance and Sexual Activity   Alcohol use: No   Drug use: No   Sexual activity: Not on file  Other Topics Concern   Not on file  Social History Narrative   Pt lives with husband in [place].  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   Social Determinants  of Health   Financial Resource Strain: Not on file  Food Insecurity: Not on file  Transportation Needs: Not on file  Physical Activity: Not on file  Stress: Not on file  Social Connections: Not on file  Intimate Partner Violence: Not on file     Review of Systems: General: negative for chills, fever, night sweats or weight changes.  Cardiovascular: negative for chest pain, dyspnea on exertion, edema, orthopnea, palpitations, paroxysmal nocturnal dyspnea or shortness of breath Dermatological: negative for rash Respiratory: negative for cough or  wheezing Urologic: negative for hematuria Abdominal: negative for nausea, vomiting, diarrhea, bright red blood per rectum, melena, or hematemesis Neurologic: negative for visual changes, syncope, or dizziness All other systems reviewed and are otherwise negative except as noted above.    Blood pressure 130/76, pulse 70, height '5\' 7"'$  (1.702 m), weight 187 lb (84.8 kg), last menstrual period 12/25/2012.  General appearance: alert and no distress Neck: no adenopathy, no carotid bruit, no JVD, supple, symmetrical, trachea midline, and thyroid not enlarged, symmetric, no tenderness/mass/nodules Lungs: clear to auscultation bilaterally Heart: regular rate and rhythm, S1, S2 normal, no murmur, click, rub or gallop Extremities: extremities normal, atraumatic, no cyanosis or edema Pulses: 2+ and symmetric Skin: Skin color, texture, turgor normal. No rashes or lesions Neurologic: Grossly normal  EKG sinus rhythm at 70 without ST or T wave changes.  I personally reviewed this EKG.  ASSESSMENT AND PLAN:   PAF (paroxysmal atrial fibrillation) (HCC) History of PAF in the past status post left atrial myxoma surgical excision and left atrial maze procedure performed by Dr. Roxy Manns March 2019 without recurrence of A-fib.  Her 2D echocardiogram performed 04/25/2018 revealed an EF of 55 to 60% with out valvular abnormality.  Left atrium was normal in size.     Lorretta Harp MD FACP,FACC,FAHA, [facility] 10/26/2022 9:34 AM

## 2022-10-26 NOTE — Patient Instructions (Addendum)
Medication Instructions:  Your physician recommends that you continue on your current medications as directed. Please refer to the Current Medication list given to you today.  *If you need a refill on your cardiac medications before your next appointment, please call your pharmacy*   Lab Work: Your physician recommends that you have labs drawn today: Lipid/liver panel  If you have labs (blood work) drawn today and your tests are completely normal, you will receive your results only by: MyChart Message (if you have MyChart) OR A paper copy in the mail If you have any lab test that is abnormal or we need to change your treatment, we will call you to review the results.   Follow-Up: At [facility], you and your health needs are our priority.  As part of our continuing mission to provide you with exceptional heart care, we have created designated Provider Care Teams.  These Care Teams include your primary Cardiologist (physician) and Advanced Practice Providers (APPs -  Physician Assistants and Nurse Practitioners) who all work together to provide you with the care you need, when you need it.  We recommend signing up for the patient portal called "MyChart".  Sign up information is provided on this After Visit Summary.  MyChart is used to connect with patients for Virtual Visits (Telemedicine).  Patients are able to view lab/test results, encounter notes, upcoming appointments, etc.  Non-urgent messages can be sent to your provider as well.   To learn more about what you can do with MyChart, go to [email or web address].    Your next appointment:   12 month(s)  The format for your next appointment:   In Person  Provider:   Quay Burow, MD

## 2022-10-26 NOTE — Assessment & Plan Note (Signed)
History of PAF in the past status post left atrial myxoma surgical excision and left atrial maze procedure performed by Dr. Roxy Manns March 2019 without recurrence of A-fib.  Her 2D echocardiogram performed 04/25/2018 revealed an EF of 55 to 60% with out valvular abnormality.  Left atrium was normal in size.

## 2022-11-18 ENCOUNTER — Telehealth: Payer: Self-pay

## 2022-11-18 NOTE — Telephone Encounter (Signed)
Faxed completed PA form to [facility] at [phone] for SynviscOne, right knee. PA pending

## 2022-12-02 ENCOUNTER — Telehealth: Payer: Self-pay

## 2022-12-02 NOTE — Telephone Encounter (Signed)
VOB submitted for Orthovisc due patient's primary insurance changing.

## 2022-12-05 ENCOUNTER — Telehealth: Payer: Self-pay

## 2022-12-05 NOTE — Telephone Encounter (Signed)
Completed PA online through covermymeds for Orthovisc, right knee. PA pending# [id number]

## 2022-12-07 ENCOUNTER — Telehealth: Payer: Self-pay

## 2022-12-07 DIAGNOSIS — M25561 Pain in right knee: Secondary | ICD-10-CM

## 2022-12-07 NOTE — Telephone Encounter (Signed)
Called and left a VM for patient to CB to schedule for gel injection with Dr. Marlou Sa or Lurena Joiner.  Patient will need 3 appt.  Per Will J.with [employer] [place], no PA is required for [id number] Reference# [id number],11/19/2022  See referral tab

## 2022-12-23 ENCOUNTER — Ambulatory Visit: Payer: Medicare Other | Admitting: Orthopedic Surgery

## 2022-12-30 ENCOUNTER — Ambulatory Visit: Payer: Medicare Other | Admitting: Orthopedic Surgery

## 2023-01-05 ENCOUNTER — Telehealth: Payer: Self-pay | Admitting: *Deleted

## 2023-01-05 NOTE — Telephone Encounter (Signed)
   Pre-operative Risk Assessment    Patient Name: Miranda Taylor  DOB: 1961-07-04 MRN: [id number]    Request for Surgical Clearance    Procedure:   COLONOSCOPY  Date of Surgery:   01/25/23                                 Surgeon:  DR Collene Mares  Surgeon's Group or Practice Name:  [facility]  Phone number:  [phone] Fax number:  [phone]   Type of Clearance Requested:   - Medical    Type of Anesthesia:   PROPOFOL   Additional requests/questions: N/A   Kathleen Argue   01/05/2023, 3:04 PM

## 2023-01-05 NOTE — Telephone Encounter (Signed)
   Name: KAMBRE SILVA  DOB: 04/04/1961  MRN: [id number]  Primary Cardiologist: Quay Burow, MD  Chart reviewed as part of pre-operative protocol coverage. Because of Dana H Mehring's past medical history and time since last visit, she will require a follow-up telephone visit in order to better assess preoperative cardiovascular risk.  Pre-op covering staff: - Please schedule appointment and call patient to inform them. If patient already had an upcoming appointment within acceptable timeframe, please add "pre-op clearance" to the appointment notes so provider is aware. - Please contact requesting surgeon's office via preferred method (i.e, phone, fax) to inform them of need for appointment prior to surgery.  No medications indicated as needing held.  Elgie Collard, PA-C  01/05/2023, 5:31 PM

## 2023-01-06 ENCOUNTER — Ambulatory Visit (INDEPENDENT_AMBULATORY_CARE_PROVIDER_SITE_OTHER): Payer: Medicare Other | Admitting: Surgical

## 2023-01-06 ENCOUNTER — Telehealth: Payer: Self-pay | Admitting: *Deleted

## 2023-01-06 ENCOUNTER — Encounter: Payer: Self-pay | Admitting: Orthopedic Surgery

## 2023-01-06 DIAGNOSIS — M1711 Unilateral primary osteoarthritis, right knee: Secondary | ICD-10-CM | POA: Diagnosis not present

## 2023-01-06 MED ORDER — LIDOCAINE HCL 1 % IJ SOLN
5.0000 mL | INTRAMUSCULAR | Status: AC | PRN
  Administered 2023-01-06: 5 mL

## 2023-01-06 MED ORDER — HYALURONAN 30 MG/2ML IX SOSY
30.0000 mg | PREFILLED_SYRINGE | INTRA_ARTICULAR | Status: AC | PRN
  Administered 2023-01-06: 30 mg via INTRA_ARTICULAR

## 2023-01-06 NOTE — Telephone Encounter (Signed)
Pt has been scheduled for a tele visit, 01/11/23.  Consent on file / medications reconciled.    Patient Consent for Virtual Visit        Miranda Taylor has provided verbal consent on 01/06/2023 for a virtual visit (video or telephone).   CONSENT FOR VIRTUAL VISIT FOR:  Miranda Taylor  By participating in this virtual visit I agree to the following:  I hereby voluntarily request, consent and authorize [facility] and its employed or contracted physicians, physician assistants, nurse practitioners or other licensed health care professionals (the Practitioner), to provide me with telemedicine health care services (the "Services") as deemed necessary by the treating Practitioner. I acknowledge and consent to receive the Services by the Practitioner via telemedicine. I understand that the telemedicine visit will involve communicating with the Practitioner through live audiovisual communication technology and the disclosure of certain medical information by electronic transmission. I acknowledge that I have been given the opportunity to request an in-person assessment or other available alternative prior to the telemedicine visit and am voluntarily participating in the telemedicine visit.  I understand that I have the right to withhold or withdraw my consent to the use of telemedicine in the course of my care at any time, without affecting my right to future care or treatment, and that the Practitioner or I may terminate the telemedicine visit at any time. I understand that I have the right to inspect all information obtained and/or recorded in the course of the telemedicine visit and may receive copies of available information for a reasonable fee.  I understand that some of the potential risks of receiving the Services via telemedicine include:  Delay or interruption in medical evaluation due to technological equipment failure or disruption; Information transmitted may not be sufficient (e.g.  poor resolution of images) to allow for appropriate medical decision making by the Practitioner; and/or  In rare instances, security protocols could fail, causing a breach of personal health information.  Furthermore, I acknowledge that it is my responsibility to provide information about my medical history, conditions and care that is complete and accurate to the best of my ability. I acknowledge that Practitioner's advice, recommendations, and/or decision may be based on factors not within their control, such as incomplete or inaccurate data provided by me or distortions of diagnostic images or specimens that may result from electronic transmissions. I understand that the practice of medicine is not an exact science and that Practitioner makes no warranties or guarantees regarding treatment outcomes. I acknowledge that a copy of this consent can be made available to me via my patient portal ([facility]), or I can request a printed copy by calling the office of [facility].    I understand that my insurance will be billed for this visit.   I have read or had this consent read to me. I understand the contents of this consent, which adequately explains the benefits and risks of the Services being provided via telemedicine.  I have been provided ample opportunity to ask questions regarding this consent and the Services and have had my questions answered to my satisfaction. I give my informed consent for the services to be provided through the use of telemedicine in my medical care

## 2023-01-06 NOTE — Progress Notes (Signed)
   Procedure Note  Patient: Miranda Taylor             Date of Birth: 20-Jun-1961           MRN: [id number]             Visit Date: 01/06/2023  Procedures: Visit Diagnoses:  1. Unilateral primary osteoarthritis, right knee     Large Joint Inj: R knee on 01/06/2023 10:05 AM Indications: pain, joint swelling and diagnostic evaluation Details: 18 G 1.5 in needle, superolateral approach  Arthrogram: No  Medications: 5 mL lidocaine 1 %; 30 mg Hyaluronan 30 MG/2ML Outcome: tolerated well, no immediate complications  Patient is a 62 year old female who returns to the office following gel injection in June 2022.  She has history of partial-thickness cartilage loss in the medial compartment from MRI scan that was reviewed several years ago.  She had great relief from gel injection in 2022 that provided near 100% relief of her knee pain for about a year.  She would like to repeat this.  She is approved for Orthovisc serial injections.  Today she presents for first Orthovisc injection in series of 3.  Tolerated injection well.  Follow-up next week. Procedure, treatment alternatives, risks and benefits explained, specific risks discussed. Consent was given by the patient. Immediately prior to procedure a time out was called to verify the correct patient, procedure, equipment, support staff and site/side marked as required. Patient was prepped and draped in the usual sterile fashion.

## 2023-01-06 NOTE — Telephone Encounter (Signed)
Pt has been scheduled for a tele visit, 01/11/23.  Consent on file / medications reconciled.

## 2023-01-10 NOTE — Progress Notes (Unsigned)
Virtual Visit via Telephone Note   Because of Miranda Taylor's co-morbid illnesses, she is at least at moderate risk for complications without adequate follow up.  This format is felt to be most appropriate for this patient at this time.  The patient did not have access to video technology/had technical difficulties with video requiring transitioning to audio format only (telephone).  All issues noted in this document were discussed and addressed.  No physical exam could be performed with this format.  Please refer to the patient's chart for her consent to telehealth for [facility].  Evaluation Performed:  Preoperative cardiovascular risk assessment _____________   Date:  01/10/2023   Patient ID:  Miranda Taylor, [place] 1961/03/01, MRN [id number] Patient Location:  Home Provider location:   Office  Primary Care Provider:  Aletha Halim., PA-C Primary Cardiologist:  Quay Burow, MD  Chief Complaint / Patient Profile   62 y.o. y/o female with a h/o paroxysmal atrial fibrillation who is pending colonoscopy and presents today for telephonic preoperative cardiovascular risk assessment.  History of Present Illness    Miranda Taylor is a 62 y.o. female who presents via audio/video conferencing for a telehealth visit today.  Pt was last seen in cardiology clinic on 10/26/2022 by Dr. Gwenlyn Found.  At that time Miranda Taylor was doing well .  The patient is now pending procedure as outlined above. Since her last visit, she remained stable from a cardiac standpoint.  Today she denies chest pain, shortness of breath, lower extremity edema, fatigue, palpitations, melena, hematuria, hemoptysis, diaphoresis, weakness, presyncope, syncope, orthopnea, and PND.   Past Medical History    Past Medical History:  Diagnosis Date   Anxiety    Arthritis    low back, degenerative spine    Chronic lower back pain    Depression    Dysrhythmia 02/2015   afib   GERD (gastroesophageal reflux  disease)    H/O hiatal hernia    Heart murmur    History of kidney stones    Hypertension    Migraines    h/o migraines    Paroxysmal atrial fibrillation (HCC)    Plantar fasciitis    Pleural effusion on right 12/27/2015   loculated   Pleural effusion on right    S/P Minimally-invasive maze operation for atrial fibrillation 12/16/2015   Complete bilateral atrial lesion set using bipolar radiofrequency and cryothermy ablation with clipping of LA appendage via right mini thoracotomy approach   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   Sepsis ([place]) 12/2015   Spinal stenosis    SUI (stress urinary incontinence, female)    Thrombus of left atrial appendage 4/16   TMJ (temporomandibular joint syndrome)    LEFT SIDE--  WEAR  MOUTH GUARD   Typical atrial flutter ([place])    Past Surgical History:  Procedure Laterality Date   ANTERIOR CERVICAL DECOMP/DISCECTOMY FUSION  04-08-2010   C4 -- C5   APPENDECTOMY  1984   BILATERAL THUNB JOINT ARTHRODESIS  left 2007/   right 2010   CARDIOVASCULAR STRESS TEST  04-27-2014   dr berry   normal perfusion study/  no ischemia/  ef 61%   CHEST TUBE INSERTION Right 12/28/2015   CLIPPING OF ATRIAL APPENDAGE N/A 12/16/2015   Procedure: CLIPPING OF ATRIAL APPENDAGE;  Surgeon: Rexene Alberts, MD;  Location: [place];  Service: Open Heart Surgery;  Laterality: N/A;   CYSTOSCOPY N/A 08/04/2014   Procedure: CYSTOSCOPY;  Surgeon: Viona Gilmore  Louis Meckel, MD;  Location: [facility];  Service: Urology;  Laterality: N/A;   LASIK  1990's   MINIMALLY INVASIVE EXCISION OF ATRIAL MYXOMA Right 12/16/2015   Procedure: MINIMALLY INVASIVE RESECTION OF LEFT ATRIAL MASS;  Surgeon: Rexene Alberts, MD;  Location: [place];  Service: Open Heart Surgery;  Laterality: Right;   MINIMALLY INVASIVE MAZE PROCEDURE N/A 12/16/2015   Procedure: MINIMALLY INVASIVE MAZE PROCEDURE;  Surgeon: Rexene Alberts, MD;  Location: [place];  Service: Open Heart Surgery;   Laterality: N/A;   NEGATIVE SLEEP STUDY  04-15-2013   POSTERIOR FUSION LUMBAR SPINE  09-16-2009   L4 -- L5   PUBOVAGINAL SLING N/A 08/04/2014   Procedure: [employer]ON SCIENTIFIC RETRO-PUBIC MID URETHRAL SLING;  Surgeon: Ardis Hughs, MD;  Location: [facility];  Service: Urology;  Laterality: N/A;   TEE WITHOUT CARDIOVERSION N/A 02/06/2015   Procedure: TRANSESOPHAGEAL ECHOCARDIOGRAM (TEE);  Surgeon: Lelon Perla, MD;  Location[facility] ENDOSCOPY;  Service: Cardiovascular;  Laterality: N/A;   TEE WITHOUT CARDIOVERSION N/A 05/14/2015   Procedure: TRANSESOPHAGEAL ECHOCARDIOGRAM (TEE);  Surgeon: Sanda Klein, MD;  Location: [facility] ENDOSCOPY;  Service: Cardiovascular;  Laterality: N/A;   TEE WITHOUT CARDIOVERSION N/A 09/02/2015   Procedure: TRANSESOPHAGEAL ECHOCARDIOGRAM (TEE);  Surgeon: Sueanne Margarita, MD;  Location: [facility] ENDOSCOPY;  Service: Cardiovascular;  Laterality: N/A;   TEE WITHOUT CARDIOVERSION N/A 09/09/2015   Procedure: TRANSESOPHAGEAL ECHOCARDIOGRAM (TEE);  Surgeon: Lelon Perla, MD;  Location: [facility] ENDOSCOPY;  Service: Cardiovascular;  Laterality: N/A;   TEE WITHOUT CARDIOVERSION N/A 12/16/2015   Procedure: TRANSESOPHAGEAL ECHOCARDIOGRAM (TEE);  Surgeon: Rexene Alberts, MD;  Location: [place];  Service: Open Heart Surgery;  Laterality: N/A;   TRANSTHORACIC ECHOCARDIOGRAM  04-03-2012   normal LVF/  ef 55-60%    Allergies  No Known Allergies  Home Medications    Prior to Admission medications   Medication Sig Start Date End Date Taking? Authorizing Provider  desvenlafaxine (PRISTIQ) 100 MG 24 hr tablet Take 100 mg by mouth daily.    [provider]  LORazepam (ATIVAN) 1 MG tablet Take 1 tablet (1 mg total) by mouth 2 times daily. 09/27/21   [provider]  Multiple Vitamin (MULITIVITAMIN WITH MINERALS) TABS Take 1 tablet by mouth daily.    [provider]  NEXIUM 40 MG capsule Take 40 mg by mouth daily as needed (for heartburn).  06/25/12    [provider]  oxyCODONE (OXY IR/ROXICODONE) 5 MG immediate release tablet Take 1-2 tablets (5-10 mg total) by mouth every 4 (four) hours as needed for severe pain. 01/01/16   Nani Skillern, PA-C  rizatriptan (MAXALT-MLT) 10 MG disintegrating tablet Take 10 mg by mouth daily as needed for migraine.  02/26/13   [provider]    Physical Exam    Vital Signs:  Miranda Taylor does not have vital signs available for review today.  Given telephonic nature of communication, physical exam is limited. AAOx3. NAD. Normal affect.  Speech and respirations are unlabored.  Accessory Clinical Findings    None  Assessment & Plan    1.  Preoperative Cardiovascular Risk Assessment: Colonoscopy, Dr. Collene Mares, [facility]    Primary Cardiologist: Quay Burow, MD  Chart reviewed as part of pre-operative protocol coverage. Given past medical history and time since last visit, based on ACC/AHA guidelines, MARYCLAIRE DRAVES would be at acceptable risk for the planned procedure without further cardiovascular testing.   The patient was advised that if she develops new  symptoms prior to surgery to contact our office to arrange for a follow-up visit, and she verbalized understanding.  I will route this recommendation to the requesting party via Epic fax function and remove from pre-op pool.     Time:   Today, I have spent 5 minutes with the patient with telehealth technology discussing medical history, symptoms, and management plan.  Prior to her phone evaluation I spent greater than 10 minutes reviewing her past medical history and cardiac medications.   Deberah Pelton, NP  01/10/2023, 3:59 PM

## 2023-01-11 ENCOUNTER — Ambulatory Visit: Payer: Medicare Other | Attending: Internal Medicine

## 2023-01-11 DIAGNOSIS — Z01810 Encounter for preprocedural cardiovascular examination: Secondary | ICD-10-CM

## 2023-01-13 ENCOUNTER — Encounter: Payer: Self-pay | Admitting: Orthopedic Surgery

## 2023-01-13 ENCOUNTER — Ambulatory Visit (INDEPENDENT_AMBULATORY_CARE_PROVIDER_SITE_OTHER): Payer: Medicare Other | Admitting: Orthopedic Surgery

## 2023-01-13 DIAGNOSIS — M1711 Unilateral primary osteoarthritis, right knee: Secondary | ICD-10-CM | POA: Diagnosis not present

## 2023-01-13 MED ORDER — LIDOCAINE HCL 1 % IJ SOLN
5.0000 mL | INTRAMUSCULAR | Status: AC | PRN
  Administered 2023-01-13: 5 mL

## 2023-01-13 MED ORDER — HYALURONAN 30 MG/2ML IX SOSY
30.0000 mg | PREFILLED_SYRINGE | INTRA_ARTICULAR | Status: AC | PRN
  Administered 2023-01-13: 30 mg via INTRA_ARTICULAR

## 2023-01-13 NOTE — Progress Notes (Signed)
   Procedure Note  Patient: Miranda Taylor             Date of Birth: [birth year]           MRN: [id number]             Visit Date: 01/13/2023  Procedures: Visit Diagnoses:  1. Unilateral primary osteoarthritis, right knee     Large Joint Inj: R knee on 01/13/2023 9:02 AM Indications: diagnostic evaluation, joint swelling and pain Details: 18 G 1.5 in needle, superolateral approach  Arthrogram: No  Medications: 5 mL lidocaine 1 %; 30 mg Hyaluronan 30 MG/2ML Outcome: tolerated well, no immediate complications Procedure, treatment alternatives, risks and benefits explained, specific risks discussed. Consent was given by the patient. Immediately prior to procedure a time out was called to verify the correct patient, procedure, equipment, support staff and site/side marked as required. Patient was prepped and draped in the usual sterile fashion.

## 2023-01-20 ENCOUNTER — Ambulatory Visit (INDEPENDENT_AMBULATORY_CARE_PROVIDER_SITE_OTHER): Payer: Medicare Other | Admitting: Orthopedic Surgery

## 2023-01-20 DIAGNOSIS — M1711 Unilateral primary osteoarthritis, right knee: Secondary | ICD-10-CM

## 2023-01-21 ENCOUNTER — Encounter: Payer: Self-pay | Admitting: Orthopedic Surgery

## 2023-01-21 MED ORDER — LIDOCAINE HCL 1 % IJ SOLN
5.0000 mL | INTRAMUSCULAR | Status: AC | PRN
  Administered 2023-01-20: 5 mL

## 2023-01-21 MED ORDER — HYALURONAN 88 MG/4ML IX SOSY
88.0000 mg | PREFILLED_SYRINGE | INTRA_ARTICULAR | Status: AC | PRN
  Administered 2023-01-20: 88 mg via INTRA_ARTICULAR

## 2023-01-21 NOTE — Progress Notes (Signed)
   Procedure Note  Patient: Miranda Taylor             Date of Birth: 07-May-1961           MRN: [id number]             Visit Date: 01/20/2023  Procedures: Visit Diagnoses: No diagnosis found.  Large Joint Inj: R knee on 01/20/2023 1:08 PM Indications: pain, joint swelling and diagnostic evaluation Details: 18 G 1.5 in needle, superolateral approach  Arthrogram: No  Medications: 5 mL lidocaine 1 %; 88 mg Hyaluronan 88 MG/4ML Outcome: tolerated well, no immediate complications  Patient returns for her third Orthovisc injection.  She states that she has noticed a fair amount of improvement after the second injection.  She is hopeful that this third injection will provide even more relief.  This was administered without complication.  Patient tolerated procedure well.  We will see her back as needed and if she wants to repeat these we can do this in 6 months. Procedure, treatment alternatives, risks and benefits explained, specific risks discussed. Consent was given by the patient. Immediately prior to procedure a time out was called to verify the correct patient, procedure, equipment, support staff and site/side marked as required. Patient was prepped and draped in the usual sterile fashion.

## 2023-06-14 ENCOUNTER — Ambulatory Visit (INDEPENDENT_AMBULATORY_CARE_PROVIDER_SITE_OTHER): Payer: Medicare HMO | Admitting: Surgical

## 2023-06-14 ENCOUNTER — Encounter: Payer: Self-pay | Admitting: Orthopedic Surgery

## 2023-06-14 ENCOUNTER — Other Ambulatory Visit (INDEPENDENT_AMBULATORY_CARE_PROVIDER_SITE_OTHER): Payer: Medicare HMO

## 2023-06-14 ENCOUNTER — Telehealth: Payer: Self-pay

## 2023-06-14 DIAGNOSIS — M1711 Unilateral primary osteoarthritis, right knee: Secondary | ICD-10-CM

## 2023-06-14 DIAGNOSIS — M1712 Unilateral primary osteoarthritis, left knee: Secondary | ICD-10-CM | POA: Diagnosis not present

## 2023-06-14 MED ORDER — BUPIVACAINE HCL 0.25 % IJ SOLN
4.0000 mL | INTRAMUSCULAR | Status: AC | PRN
  Administered 2023-06-14: 4 mL via INTRA_ARTICULAR

## 2023-06-14 MED ORDER — LIDOCAINE HCL 1 % IJ SOLN
5.0000 mL | INTRAMUSCULAR | Status: AC | PRN
  Administered 2023-06-14: 5 mL

## 2023-06-14 MED ORDER — METHYLPREDNISOLONE ACETATE 40 MG/ML IJ SUSP
40.0000 mg | INTRAMUSCULAR | Status: AC | PRN
  Administered 2023-06-14: 40 mg via INTRA_ARTICULAR

## 2023-06-14 MED ORDER — METHYLPREDNISOLONE ACETATE 40 MG/ML IJ SUSP
40.0000 mg | INTRAMUSCULAR | Status: AC | PRN
Start: 2023-06-14 — End: 2023-06-14
  Administered 2023-06-14: 40 mg via INTRA_ARTICULAR

## 2023-06-14 NOTE — Telephone Encounter (Signed)
VOB submitted for Orthovisc, bilateral knee.  Next available gel injection would need to be after 07/23/2023.

## 2023-06-14 NOTE — Telephone Encounter (Signed)
Auth needed for bilat knee gel  

## 2023-06-14 NOTE — Progress Notes (Signed)
Office Visit Note   Patient: Miranda Taylor           Date of Birth: 1961/10/10           MRN: [id number] Visit Date: 06/14/2023 Requested by: Richmond Campbell., PA-C [address]  [place] [address] PCP: Richmond Campbell., PA-C  Subjective: Chief Complaint  Patient presents with   Right Knee - Pain    HPI: Miranda Taylor is a 62 y.o. female who presents to the office reporting right knee pain. Had prior right knee gel injection series in March 2024. Got good relief for 4 months. Pain returned 1 month ago.  Describes primarily anteromedial pain with "tightness" sensation and occ swelling. Has occasional popping but no locking.  Increased pain with bending knee at times. No groin pain, radicular pain, numbness/tingling. Popping feels like it may be new.  No new injury or fall. Able to walk without much difficulty but pain increases if she walks too much or has to do a lot of stairs. Takes occasional ibuprofen.  She also notes increased pain in the left knee that she has noticed for the last several weeks.  No history of injury to this knee either.  She describes a sharp stabbing sensation in the anterior medial aspect of the knee at times.  No mechanical symptoms in this knee.  Never really had much issues with this knee..                ROS: All systems reviewed are negative as they relate to the chief complaint within the history of present illness.  Patient denies fevers or chills.  Assessment & Plan: Visit Diagnoses:  1. Unilateral primary osteoarthritis, right knee   2. Unilateral primary osteoarthritis, left knee     Plan: Patient is a 62 year old female who presents for evaluation of bilateral knee pain.  Has history of mild right knee osteoarthritis and has new radiographs today demonstrating mild medial and patellofemoral compartment arthritis.  She has slightly worse medial compartment arthritis of the left knee based on today's radiographs which seem to correlate  with her new left knee pain.  We discussed options available to patient.  She would like to try gel injections again in the future at the next possible interval which will be around September.  Will also plan to administer cortisone injections into bilateral knees today.  Tolerated procedures well without complication.  No history of diabetes.  Follow-up when gel injections are approved.  Follow-Up Instructions: No follow-ups on file.   Orders:  Orders Placed This Encounter  Procedures   XR KNEE 3 VIEW RIGHT   No orders of the defined types were placed in this encounter.     Procedures: Large Joint Inj: bilateral knee on 06/14/2023 10:19 AM Indications: diagnostic evaluation, joint swelling and pain Details: 18 G 1.5 in needle, superolateral approach  Arthrogram: No  Medications (Right): 5 mL lidocaine 1 %; 4 mL bupivacaine 0.25 %; 40 mg methylPREDNISolone acetate 40 MG/ML Medications (Left): 5 mL lidocaine 1 %; 4 mL bupivacaine 0.25 %; 40 mg methylPREDNISolone acetate 40 MG/ML Outcome: tolerated well, no immediate complications Procedure, treatment alternatives, risks and benefits explained, specific risks discussed. Consent was given by the patient. Immediately prior to procedure a time out was called to verify the correct patient, procedure, equipment, support staff and site/side marked as required. Patient was prepped and draped in the usual sterile fashion.       Clinical Data: No additional  findings.  Objective: Vital Signs: LMP 12/25/2012   Physical Exam:  Constitutional: Patient appears well-developed HEENT:  Head: Normocephalic Eyes:EOM are normal Neck: Normal range of motion Cardiovascular: Normal rate Pulmonary/chest: Effort normal Neurologic: Patient is alert Skin: Skin is warm Psychiatric: Patient has normal mood and affect  Ortho Exam: Ortho exam demonstrates bilateral knees with trace effusion in the right knee and no effusion of the left knee.  She has  tenderness over the medial joint line of both knees.  No tenderness over the lateral joint line of either knee.  She is able to perform straight leg raise with no extensor lag.  No pain with hip range of motion.  Stable to anterior and posterior drawer sign.  Stable to varus stress at 0 and 30 degrees.  No calf tenderness.  Negative Homans' sign bilaterally.  Specialty Comments:  No specialty comments available.  Imaging: No results found.   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   Paroxysmal atrial fibrillation (HCC)    Overweight 03/31/2015   Chronic diastolic heart failure (HCC) 02/24/2015   Atrial flutter with rapid ventricular response (HCC) 02/03/2015   PAF (paroxysmal atrial fibrillation) (HCC) 02/03/2015   Hyperglycemia 02/03/2015   Family history of early CAD 10/15/2014   Major depressive disorder, recurrent episode, moderate (HCC) 11/11/2013   Anxiety and depression 08/26/2013   Chronic lower back pain    Fibromyalgia    GERD (gastroesophageal reflux disease)    DDD (degenerative disc disease), cervical    UTI (lower urinary tract infection) 04/02/2012   Migraines 04/02/2012   Past Medical History:  Diagnosis Date   Anxiety    Arthritis    low back, degenerative spine    Chronic lower back pain    Depression    Dysrhythmia 02/2015   afib   GERD (gastroesophageal reflux disease)    H/O hiatal hernia    Heart murmur    History of kidney stones    Hypertension    Migraines    h/o migraines    Paroxysmal atrial fibrillation (HCC)    Plantar fasciitis    Pleural effusion on right 12/27/2015   loculated   Pleural effusion on right    S/P  Minimally-invasive maze operation for atrial fibrillation 12/16/2015   Complete bilateral atrial lesion set using bipolar radiofrequency and cryothermy ablation with clipping of LA appendage via right mini thoracotomy approach   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   Sepsis (HCC) 12/2015   Spinal stenosis    SUI (stress urinary incontinence, female)    Thrombus of left atrial appendage 4/16   TMJ (temporomandibular joint syndrome)    LEFT SIDE--  WEAR  MOUTH GUARD   Typical atrial flutter (HCC)     Family History  Problem Relation Age of Onset   Heart attack Father 40   CAD Father 78   Arrhythmia Father    Heart disease Mother    Hypertension Other     Past Surgical History:  Procedure Laterality Date   ANTERIOR CERVICAL DECOMP/DISCECTOMY FUSION  04-08-2010   C4 -- C5   APPENDECTOMY  1984   BILATERAL THUNB JOINT ARTHRODESIS  left 2007/   right 2010   CARDIOVASCULAR  STRESS TEST  04-27-2014   dr berry   normal perfusion study/  no ischemia/  ef 61%   CHEST TUBE INSERTION Right 12/28/2015   CLIPPING OF ATRIAL APPENDAGE N/A 12/16/2015   Procedure: CLIPPING OF ATRIAL APPENDAGE;  Surgeon: Purcell Nails, MD;  Location: [facility] OR;  Service: Open Heart Surgery;  Laterality: N/A;   CYSTOSCOPY N/A 08/04/2014   Procedure: CYSTOSCOPY;  Surgeon: Crist Fat, MD;  Location: [facility];  Service: Urology;  Laterality: N/A;   LASIK  1990's   MINIMALLY INVASIVE EXCISION OF ATRIAL MYXOMA Right 12/16/2015   Procedure: MINIMALLY INVASIVE RESECTION OF LEFT ATRIAL MASS;  Surgeon: Purcell Nails, MD;  Location: [facility] OR;  Service: Open Heart Surgery;  Laterality: Right;   MINIMALLY INVASIVE MAZE PROCEDURE N/A 12/16/2015   Procedure: MINIMALLY INVASIVE MAZE PROCEDURE;  Surgeon: Purcell Nails, MD;  Location: [facility] OR;  Service: Open Heart Surgery;  Laterality: N/A;   NEGATIVE SLEEP STUDY  04-15-2013   POSTERIOR FUSION LUMBAR SPINE  09-16-2009   L4 -- L5    PUBOVAGINAL SLING N/A 08/04/2014   Procedure: BOSTON SCIENTIFIC RETRO-PUBIC MID URETHRAL SLING;  Surgeon: Crist Fat, MD;  Location[facility];  Service: Urology;  Laterality: N/A;   TEE WITHOUT CARDIOVERSION N/A 02/06/2015   Procedure: TRANSESOPHAGEAL ECHOCARDIOGRAM (TEE);  Surgeon: Lewayne Bunting, MD;  Location: [facility] ENDOSCOPY;  Service: Cardiovascular;  Laterality: N/A;   TEE WITHOUT CARDIOVERSION N/A 05/14/2015   Procedure: TRANSESOPHAGEAL ECHOCARDIOGRAM (TEE);  Surgeon: Thurmon Fair, MD;  Location: [facility] ENDOSCOPY;  Service: Cardiovascular;  Laterality: N/A;   TEE WITHOUT CARDIOVERSION N/A 09/02/2015   Procedure: TRANSESOPHAGEAL ECHOCARDIOGRAM (TEE);  Surgeon: Quintella Reichert, MD;  Location: [facility] ENDOSCOPY;  Service: Cardiovascular;  Laterality: N/A;   TEE WITHOUT CARDIOVERSION N/A 09/09/2015   Procedure: TRANSESOPHAGEAL ECHOCARDIOGRAM (TEE);  Surgeon: Lewayne Bunting, MD;  Location: [facility] ENDOSCOPY;  Service: Cardiovascular;  Laterality: N/A;   TEE WITHOUT CARDIOVERSION N/A 12/16/2015   Procedure: TRANSESOPHAGEAL ECHOCARDIOGRAM (TEE);  Surgeon: Purcell Nails, MD;  Location: [facility] OR;  Service: Open Heart Surgery;  Laterality: N/A;   TRANSTHORACIC ECHOCARDIOGRAM  04-03-2012   normal LVF/  ef 55-60%   Social History   Occupational History   Occupation: Disabled  Tobacco Use   Smoking status: Former    Current packs/day: 0.00    Average packs/day: 0.5 packs/day for 20.0 years (10.0 ttl pk-yrs)    Types: Cigarettes    Start date: 11/07/1981    Quit date: 11/07/2001    Years since quitting: 21.6   Smokeless tobacco: Never  Vaping Use   Vaping status: Never Used  Substance and Sexual Activity   Alcohol use: No   Drug use: No   Sexual activity: Not on file

## 2023-07-25 ENCOUNTER — Telehealth: Payer: Self-pay

## 2023-07-25 NOTE — Telephone Encounter (Signed)
Faxed completed PA forms to [employer] at [phone] and [employer] at [phone] for Orthovisc, bilateral knee. PA's pending

## 2023-07-31 ENCOUNTER — Telehealth: Payer: Self-pay | Admitting: Orthopedic Surgery

## 2023-07-31 NOTE — Telephone Encounter (Signed)
Pt called stating she got approval letter for gel injection. Dont see in pt chart. Please call pt Miranda Taylor to set appt when we get approval. Pt phone number is [phone].

## 2023-08-01 NOTE — Telephone Encounter (Signed)
Called and left a VM advising patient that I am still awaiting approval from [facility] for the gel injection and to call back with any questions or concerns.

## 2023-08-08 ENCOUNTER — Other Ambulatory Visit: Payer: Self-pay

## 2023-08-08 DIAGNOSIS — M1712 Unilateral primary osteoarthritis, left knee: Secondary | ICD-10-CM

## 2023-08-08 DIAGNOSIS — M1711 Unilateral primary osteoarthritis, right knee: Secondary | ICD-10-CM

## 2023-08-23 ENCOUNTER — Encounter: Payer: Self-pay | Admitting: Orthopedic Surgery

## 2023-08-23 ENCOUNTER — Ambulatory Visit (INDEPENDENT_AMBULATORY_CARE_PROVIDER_SITE_OTHER): Payer: BC Managed Care – PPO | Admitting: Orthopedic Surgery

## 2023-08-23 DIAGNOSIS — M1711 Unilateral primary osteoarthritis, right knee: Secondary | ICD-10-CM

## 2023-08-23 DIAGNOSIS — M1712 Unilateral primary osteoarthritis, left knee: Secondary | ICD-10-CM

## 2023-08-23 DIAGNOSIS — M170 Bilateral primary osteoarthritis of knee: Secondary | ICD-10-CM | POA: Diagnosis not present

## 2023-08-23 MED ORDER — HYALURONAN 30 MG/2ML IX SOSY
30.0000 mg | PREFILLED_SYRINGE | INTRA_ARTICULAR | Status: AC | PRN
Start: 2023-08-23 — End: 2023-08-23
  Administered 2023-08-23: 30 mg via INTRA_ARTICULAR

## 2023-08-23 MED ORDER — LIDOCAINE HCL 1 % IJ SOLN
5.0000 mL | INTRAMUSCULAR | Status: AC | PRN
Start: 2023-08-23 — End: 2023-08-23
  Administered 2023-08-23: 5 mL

## 2023-08-23 NOTE — Progress Notes (Signed)
Procedure Note  Patient: Miranda Taylor             Date of Birth: 1961/08/29           MRN: [id number]             Visit Date: 08/23/2023  Procedures: Visit Diagnoses: No diagnosis found.  Large Joint Inj: R knee on 08/23/2023 9:14 AM Indications: diagnostic evaluation, joint swelling and pain Details: 18 G 1.5 in needle, superolateral approach  Arthrogram: No  Medications: 5 mL lidocaine 1 %; 30 mg Hyaluronan 30 MG/2ML Outcome: tolerated well, no immediate complications Procedure, treatment alternatives, risks and benefits explained, specific risks discussed. Consent was given by the patient. Immediately prior to procedure a time out was called to verify the correct patient, procedure, equipment, support staff and site/side marked as required. Patient was prepped and draped in the usual sterile fashion.    Large Joint Inj: L knee on 08/23/2023 9:14 AM Indications: diagnostic evaluation, joint swelling and pain Details: 18 G 1.5 in needle, superolateral approach  Arthrogram: No  Medications: 5 mL lidocaine 1 %; 30 mg Hyaluronan 30 MG/2ML Outcome: tolerated well, no immediate complications Procedure, treatment alternatives, risks and benefits explained, specific risks discussed. Consent was given by the patient. Immediately prior to procedure a time out was called to verify the correct patient, procedure, equipment, support staff and site/side marked as required. Patient was prepped and draped in the usual sterile fashion.

## 2023-08-30 ENCOUNTER — Encounter: Payer: Self-pay | Admitting: Orthopedic Surgery

## 2023-08-30 ENCOUNTER — Ambulatory Visit (INDEPENDENT_AMBULATORY_CARE_PROVIDER_SITE_OTHER): Payer: BC Managed Care – PPO | Admitting: Surgical

## 2023-08-30 DIAGNOSIS — M1711 Unilateral primary osteoarthritis, right knee: Secondary | ICD-10-CM

## 2023-08-30 DIAGNOSIS — M1712 Unilateral primary osteoarthritis, left knee: Secondary | ICD-10-CM

## 2023-08-30 DIAGNOSIS — M170 Bilateral primary osteoarthritis of knee: Secondary | ICD-10-CM

## 2023-08-30 MED ORDER — HYALURONAN 30 MG/2ML IX SOSY
30.0000 mg | PREFILLED_SYRINGE | INTRA_ARTICULAR | Status: AC | PRN
Start: 2023-08-30 — End: 2023-08-30
  Administered 2023-08-30: 30 mg via INTRA_ARTICULAR

## 2023-08-30 MED ORDER — LIDOCAINE HCL 1 % IJ SOLN
5.0000 mL | INTRAMUSCULAR | Status: AC | PRN
  Administered 2023-08-30: 5 mL

## 2023-08-30 NOTE — Progress Notes (Signed)
Procedure Note  Patient: Miranda Taylor             Date of Birth: 23-Mar-1961           MRN: [id number]             Visit Date: 08/30/2023  Procedures: Visit Diagnoses: No diagnosis found.  Large Joint Inj: bilateral knee on 08/30/2023 9:16 AM Indications: diagnostic evaluation, joint swelling and pain Details: 18 G 1.5 in needle, superolateral approach  Arthrogram: No  Medications (Right): 5 mL lidocaine 1 %; 30 mg Hyaluronan 30 MG/2ML Medications (Left): 5 mL lidocaine 1 %; 30 mg Hyaluronan 30 MG/2ML Outcome: tolerated well, no immediate complications Procedure, treatment alternatives, risks and benefits explained, specific risks discussed. Consent was given by the patient. Immediately prior to procedure a time out was called to verify the correct patient, procedure, equipment, support staff and site/side marked as required. Patient was prepped and draped in the usual sterile fashion.

## 2023-09-06 ENCOUNTER — Encounter: Payer: Self-pay | Admitting: Surgical

## 2023-09-06 ENCOUNTER — Ambulatory Visit (INDEPENDENT_AMBULATORY_CARE_PROVIDER_SITE_OTHER): Payer: BC Managed Care – PPO | Admitting: Surgical

## 2023-09-06 DIAGNOSIS — M1711 Unilateral primary osteoarthritis, right knee: Secondary | ICD-10-CM

## 2023-09-06 DIAGNOSIS — M1712 Unilateral primary osteoarthritis, left knee: Secondary | ICD-10-CM

## 2023-09-06 DIAGNOSIS — M170 Bilateral primary osteoarthritis of knee: Secondary | ICD-10-CM | POA: Diagnosis not present

## 2023-09-06 MED ORDER — HYALURONAN 30 MG/2ML IX SOSY
30.0000 mg | PREFILLED_SYRINGE | INTRA_ARTICULAR | Status: AC | PRN
  Administered 2023-09-06: 30 mg via INTRA_ARTICULAR

## 2023-09-06 MED ORDER — LIDOCAINE HCL 1 % IJ SOLN
5.0000 mL | INTRAMUSCULAR | Status: AC | PRN
  Administered 2023-09-06: 5 mL

## 2023-09-06 NOTE — Progress Notes (Signed)
Procedure Note  Patient: Miranda Taylor             Date of Birth: 11/07/1961           MRN: [id number]             Visit Date: 09/06/2023  Procedures: Visit Diagnoses:  1. Unilateral primary osteoarthritis, right knee   2. Unilateral primary osteoarthritis, left knee     Large Joint Inj: bilateral knee on 09/06/2023 4:24 PM Indications: diagnostic evaluation, joint swelling and pain Details: 18 G 1.5 in needle, superolateral approach  Arthrogram: No  Medications (Right): 5 mL lidocaine 1 %; 30 mg Hyaluronan 30 MG/2ML Medications (Left): 5 mL lidocaine 1 %; 30 mg Hyaluronan 30 MG/2ML Outcome: tolerated well, no immediate complications Procedure, treatment alternatives, risks and benefits explained, specific risks discussed. Consent was given by the patient. Immediately prior to procedure a time out was called to verify the correct patient, procedure, equipment, support staff and site/side marked as required. Patient was prepped and draped in the usual sterile fashion.

## 2023-10-24 ENCOUNTER — Ambulatory Visit: Payer: BC Managed Care – PPO | Attending: Cardiovascular Disease | Admitting: Cardiovascular Disease

## 2023-10-24 ENCOUNTER — Encounter: Payer: Self-pay | Admitting: Cardiovascular Disease

## 2023-10-24 VITALS — BP 134/80 | HR 71 | Ht 67.0 in | Wt 175.0 lb

## 2023-10-24 DIAGNOSIS — Z9889 Other specified postprocedural states: Secondary | ICD-10-CM | POA: Diagnosis not present

## 2023-10-24 DIAGNOSIS — Z8679 Personal history of other diseases of the circulatory system: Secondary | ICD-10-CM | POA: Diagnosis not present

## 2023-10-24 DIAGNOSIS — I480 Paroxysmal atrial fibrillation: Secondary | ICD-10-CM

## 2023-10-24 DIAGNOSIS — I4892 Unspecified atrial flutter: Secondary | ICD-10-CM

## 2023-10-24 NOTE — Assessment & Plan Note (Signed)
History of PAF status post left atrial myxoma surgical excision with left atrial maze procedure by Dr. Rande Lawman March 2019.  She no longer is on antiarrhythmic therapy.  She is not on oral anticoagulation and has maintained sinus rhythm.

## 2023-10-24 NOTE — Progress Notes (Signed)
10/24/2023 Miranda Taylor   1961/09/19  [id number]  Primary Physician Richmond Campbell., PA-C Primary Cardiologist: Runell Gess MD FACP, [place], [place], [place]  HPI:  Miranda Taylor is a 61 y.o.  mildly overweight married Caucasian female, mother of 4 and grandmother to 7 grandchildren, whom I last saw in the office 10/26/2022.Marland Kitchen She has a history of symptomatic paroxysmal atrial fibrillation with a low [place] score on beta-blocker and aspirin. She had a normal 2D echo and stress test. I did an event monitor that showed multiple episodes of PAF, which she is symptomatic from with complaints of shortness of breath and fatigue. I referred her to Dr. Hillis Range at [facility], Electrophysiologist, who placed her on flecainide 50 p.o. b.i.d. empirically. Her symptoms have improved.she had symptoms compatible with obstructive sleep apnea when I last saw her 2 years ago and a subsequent outpatient sleep study did not suggest this. She was recently admitted to the hospital 02/03/15 for one week with A. Fib with RVR. A transesophageal echo showed reduced systolic function with 35-40% range with left atrial smoke/thrombus and therefore cardioversion was not performed. She was placed on amiodarone and rate controlled and placed on oral anticoagulation. She subsequently converted o normal sinus rhythm. 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 Cornelius Moras which was performed in March 2019. Her postoperative echo revealed normal LV function. She feels clinically improved.    Since I saw her in the office 1 year ago she remains stable.  She continues to be in sinus rhythm.  She denies chest pain or shortness of breath.   Current Meds  Medication Sig   desvenlafaxine (PRISTIQ) 100 MG 24 hr tablet Take 100 mg by mouth daily.   LORazepam (ATIVAN) 1 MG tablet Take 1 tablet  (1 mg total) by mouth 2 times daily.   Multiple Vitamin (MULITIVITAMIN WITH MINERALS) TABS Take 1 tablet by mouth daily.   NEXIUM 40 MG capsule Take 40 mg by mouth daily as needed (for heartburn).    oxyCODONE (OXY IR/ROXICODONE) 5 MG immediate release tablet Take 1-2 tablets (5-10 mg total) by mouth every 4 (four) hours as needed for severe pain.   rizatriptan (MAXALT-MLT) 10 MG disintegrating tablet Take 10 mg by mouth daily as needed for migraine.      No Known Allergies  Social History   Socioeconomic History   Marital status: Married    Spouse name: Not on file   Number of children: Not on file   Years of education: Not on file   Highest education level: Not on file  Occupational History   Occupation: Disabled  Tobacco Use   Smoking status: Former    Current packs/day: 0.00    Average packs/day: 0.5 packs/day for 20.0 years (10.0 ttl pk-yrs)    Types: Cigarettes    Start date: 11/07/1981    Quit date: 11/07/2001    Years since quitting: 21.9   Smokeless tobacco: Never  Vaping Use   Vaping status: Never Used  Substance and Sexual Activity   Alcohol use: No   Drug use: No   Sexual activity: Not on file  Other Topics Concern   Not on file  Social History Narrative   Pt lives with husband in [place].  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  4 children   Social Drivers of Health  Financial Resource Strain: Not on file  Food Insecurity: Low Risk  (02/19/2023)   Received from [facility]   Hunger Vital Sign    Within the past 12 months, you worried that your food would run out before you got money to buy more: Not on file    Ran Out of Food in the Last Year: Never true  Transportation Needs: Not on file (02/19/2023)  Physical Activity: Not on file  Stress: Not on file  Social Connections: Unknown (03/21/2022)   Received from [facility], [facility]   Social Network    Social Network: Not on file  Intimate Partner Violence:  Unknown (02/10/2022)   Received from [facility], [facility]   HITS    Physically Hurt: Not on file    Insult or Talk Down To: Not on file    Threaten Physical Harm: Not on file    Scream or Curse: Not on file     Review of Systems: General: negative for chills, fever, night sweats or weight changes.  Cardiovascular: negative for chest pain, dyspnea on exertion, edema, orthopnea, palpitations, paroxysmal nocturnal dyspnea or shortness of breath Dermatological: negative for rash Respiratory: negative for cough or wheezing Urologic: negative for hematuria Abdominal: negative for nausea, vomiting, diarrhea, bright red blood per rectum, melena, or hematemesis Neurologic: negative for visual changes, syncope, or dizziness All other systems reviewed and are otherwise negative except as noted above.    Blood pressure 134/80, pulse 71, height 5\' 7"  (1.702 m), weight 175 lb (79.4 kg), last menstrual period 12/25/2012, SpO2 99%.  General appearance: alert and no distress Neck: no adenopathy, no carotid bruit, no JVD, supple, symmetrical, trachea midline, and thyroid not enlarged, symmetric, no tenderness/mass/nodules Lungs: clear to auscultation bilaterally Heart: regular rate and rhythm, S1, S2 normal, no murmur, click, rub or gallop Extremities: extremities normal, atraumatic, no cyanosis or edema Pulses: 2+ and symmetric Skin: Skin color, texture, turgor normal. No rashes or lesions Neurologic: Grossly normal  EKG EKG Interpretation Date/Time:  Tuesday October 24 2023 08:52:53 EST Ventricular Rate:  69 PR Interval:  164 QRS Duration:  76 QT Interval:  412 QTC Calculation: 441 R Axis:   31  Text Interpretation: Normal sinus rhythm Normal ECG When compared with ECG of 14-Dec-2017 10:54, No significant change was found Confirmed by Nanetta Batty [phone]) on 10/24/2023 9:05:00 AM    ASSESSMENT AND PLAN:   PAF (paroxysmal atrial fibrillation) (HCC) History of PAF status post  left atrial myxoma surgical excision with left atrial maze procedure by Dr. Rande Lawman March 2019.  She no longer is on antiarrhythmic therapy.  She is not on oral anticoagulation and has maintained sinus rhythm.  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 Cornelius Moras  March 2019.     Runell Gess MD FACP,FACC,FAHA, [facility] 10/24/2023 9:11 AM

## 2023-10-24 NOTE — Patient Instructions (Signed)

## 2023-10-24 NOTE — Assessment & Plan Note (Signed)
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 Cornelius Moras  March 2019.

## 2023-10-27 ENCOUNTER — Other Ambulatory Visit: Payer: Self-pay | Admitting: Gastroenterology

## 2023-10-27 DIAGNOSIS — R1310 Dysphagia, unspecified: Secondary | ICD-10-CM

## 2023-11-23 ENCOUNTER — Other Ambulatory Visit: Payer: Medicare HMO

## 2023-12-06 ENCOUNTER — Ambulatory Visit: Payer: PPO | Admitting: Surgical

## 2023-12-06 ENCOUNTER — Encounter: Payer: Self-pay | Admitting: Orthopedic Surgery

## 2023-12-06 DIAGNOSIS — M1711 Unilateral primary osteoarthritis, right knee: Secondary | ICD-10-CM

## 2023-12-06 DIAGNOSIS — M1712 Unilateral primary osteoarthritis, left knee: Secondary | ICD-10-CM | POA: Diagnosis not present

## 2023-12-06 NOTE — Progress Notes (Signed)
Follow-up Office Visit Note   Patient: Miranda Taylor           Date of Birth: [birth year]           MRN: [id number] Visit Date: 12/06/2023 Requested by: Richmond Campbell., PA-C [address],  [place] [address] PCP: Richmond Campbell., PA-C  Subjective: Chief Complaint  Patient presents with   Left Knee - Follow-up    Gel injection   Right Knee - Follow-up    Gel injection    HPI: Miranda Taylor is a 63 y.o. female who returns to the office for follow-up visit.    Plan at last visit was: Bilateral knee gel injections  Since then, patient notes she really has no complaints.  Gel injections have done very well for her.  This is her second series of gel injections.  First series lasted for about 6 months.  She has only had return of minimal lateral sided left knee pain but otherwise has no significant knee pain.  Does not limit her activities.  Does not wake her up from sleep at night.              ROS: All systems reviewed are negative as they relate to the chief complaint within the history of present illness.  Patient denies fevers or chills.  Assessment & Plan: Visit Diagnoses:  1. Unilateral primary osteoarthritis, right knee   2. Unilateral primary osteoarthritis, left knee     Plan: Miranda Taylor is a 63 y.o. female who returns to the office for follow-up visit.  Plan from last visit was noted above in HPI.  They now return with significant symptomatic improvement of bilateral knee pain following gel injections.  We will plan for her to see how her knees trend and let us know once the injections wear off and we can either try cortisone injections or repeat gel injections if it has been 6 months from the last injection at that point.  Follow-Up Instructions: No follow-ups on file.   Orders:  No orders of the defined types were placed in this encounter.  No orders of the defined types were placed in this encounter.     Procedures: No procedures  performed   Clinical Data: No additional findings.  Objective: Vital Signs: LMP 12/25/2012   Physical Exam:  Constitutional: Patient appears well-developed HEENT:  Head: Normocephalic Eyes:EOM are normal Neck: Normal range of motion Cardiovascular: Normal rate Pulmonary/chest: Effort normal Neurologic: Patient is alert Skin: Skin is warm Psychiatric: Patient has normal mood and affect  Ortho Exam: Ortho exam demonstrates no effusion in either knee.  Full active and passive range of motion both knees.  No calf tenderness.  Excellent quad and hamstring strength.    Specialty Comments:  No specialty comments available.  Imaging: No results found.   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   Paroxysmal atrial fibrillation (HCC)    Overweight 03/31/2015   Chronic diastolic heart failure (HCC) 02/24/2015   Atrial flutter with rapid ventricular response (HCC) 02/03/2015   PAF (paroxysmal atrial fibrillation) (HCC) 02/03/2015   Hyperglycemia 02/03/2015   Family history of early CAD 10/15/2014   Major depressive disorder, recurrent episode,  moderate (HCC) 11/11/2013   Anxiety and depression 08/26/2013   Chronic lower back pain    Fibromyalgia    GERD (gastroesophageal reflux disease)    DDD (degenerative disc disease), cervical    Lower urinary tract infectious disease 04/02/2012   Migraines 04/02/2012   Past Medical History:  Diagnosis Date   Anxiety    Arthritis    low back, degenerative spine    Chronic lower back pain    Depression    Dysrhythmia 02/2015   afib   GERD (gastroesophageal reflux disease)    H/O hiatal hernia    Heart murmur     History of kidney stones    Hypertension    Migraines    h/o migraines    Paroxysmal atrial fibrillation (HCC)    Plantar fasciitis    Pleural effusion on right 12/27/2015   loculated   Pleural effusion on right    S/P Minimally-invasive maze operation for atrial fibrillation 12/16/2015   Complete bilateral atrial lesion set using bipolar radiofrequency and cryothermy ablation with clipping of LA appendage via right mini thoracotomy approach   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   Sepsis (HCC) 12/2015   Spinal stenosis    SUI (stress urinary incontinence, female)    Thrombus of left atrial appendage 4/16   TMJ (temporomandibular joint syndrome)    LEFT SIDE--  WEAR  MOUTH GUARD   Typical atrial flutter (HCC)     Family History  Problem Relation Age of Onset   Heart attack Father 59   CAD Father 76   Arrhythmia Father    Heart disease Mother    Hypertension Other     Past Surgical History:  Procedure Laterality Date   ANTERIOR CERVICAL DECOMP/DISCECTOMY FUSION  04-08-2010   C4 -- C5   APPENDECTOMY  1984   BILATERAL THUNB JOINT ARTHRODESIS  left 2007/   right 2010   CARDIOVASCULAR STRESS TEST  04-27-2014   dr berry   normal perfusion study/  no ischemia/  ef 61%   CHEST TUBE INSERTION Right 12/28/2015   CLIPPING OF ATRIAL APPENDAGE N/A 12/16/2015   Procedure: CLIPPING OF ATRIAL APPENDAGE;  Surgeon: Purcell Nails, MD;  Location: [facility] OR;  Service: Open Heart Surgery;  Laterality: N/A;   CYSTOSCOPY N/A 08/04/2014   Procedure: CYSTOSCOPY;  Surgeon: Crist Fat, MD;  Location: [facility];  Service: Urology;  Laterality: N/A;   LASIK  1990's   MINIMALLY INVASIVE EXCISION OF ATRIAL MYXOMA Right 12/16/2015   Procedure: MINIMALLY INVASIVE RESECTION OF LEFT ATRIAL MASS;  Surgeon: Purcell Nails, MD;  Location: [facility] OR;  Service: Open Heart Surgery;  Laterality: Right;   MINIMALLY INVASIVE MAZE PROCEDURE N/A 12/16/2015   Procedure:  MINIMALLY INVASIVE MAZE PROCEDURE;  Surgeon: Purcell Nails, MD;  Location: [facility] OR;  Service: Open Heart Surgery;  Laterality: N/A;   NEGATIVE SLEEP STUDY  04-15-2013   POSTERIOR FUSION LUMBAR SPINE  09-16-2009   L4 -- L5   PUBOVAGINAL SLING N/A 08/04/2014   Procedure: BOSTON SCIENTIFIC RETRO-PUBIC MID URETHRAL SLING;  Surgeon: Crist Fat, MD;  Location: [facility];  Service: Urology;  Laterality: N/A;   TEE WITHOUT CARDIOVERSION N/A 02/06/2015   Procedure: TRANSESOPHAGEAL ECHOCARDIOGRAM (TEE);  Surgeon: Lewayne Bunting, MD;  Location: [facility] ENDOSCOPY;  Service: Cardiovascular;  Laterality: N/A;   TEE WITHOUT CARDIOVERSION N/A 05/14/2015   Procedure: TRANSESOPHAGEAL ECHOCARDIOGRAM (TEE);  Surgeon: Thurmon Fair, MD;  Location: [facility] ENDOSCOPY;  Service:  Cardiovascular;  Laterality: N/A;   TEE WITHOUT CARDIOVERSION N/A 09/02/2015   Procedure: TRANSESOPHAGEAL ECHOCARDIOGRAM (TEE);  Surgeon: Quintella Reichert, MD;  Location: [facility] ENDOSCOPY;  Service: Cardiovascular;  Laterality: N/A;   TEE WITHOUT CARDIOVERSION N/A 09/09/2015   Procedure: TRANSESOPHAGEAL ECHOCARDIOGRAM (TEE);  Surgeon: Lewayne Bunting, MD;  Location: [facility] ENDOSCOPY;  Service: Cardiovascular;  Laterality: N/A;   TEE WITHOUT CARDIOVERSION N/A 12/16/2015   Procedure: TRANSESOPHAGEAL ECHOCARDIOGRAM (TEE);  Surgeon: Purcell Nails, MD;  Location: [facility] OR;  Service: Open Heart Surgery;  Laterality: N/A;   TRANSTHORACIC ECHOCARDIOGRAM  04-03-2012   normal LVF/  ef 55-60%   Social History   Occupational History   Occupation: Disabled  Tobacco Use   Smoking status: Former    Current packs/day: 0.00    Average packs/day: 0.5 packs/day for 20.0 years (10.0 ttl pk-yrs)    Types: Cigarettes    Start date: 11/07/1981    Quit date: 11/07/2001    Years since quitting: 22.0   Smokeless tobacco: Never  Vaping Use   Vaping status: Never Used  Substance and Sexual Activity   Alcohol use: No   Drug use: No   Sexual activity: Not on  file

## 2023-12-07 ENCOUNTER — Ambulatory Visit
Admission: RE | Admit: 2023-12-07 | Discharge: 2023-12-07 | Disposition: A | Discharge status: Home / Self Care | Payer: PPO | Source: Ambulatory Visit | Attending: Gastroenterology | Admitting: Gastroenterology

## 2023-12-07 DIAGNOSIS — R1310 Dysphagia, unspecified: Secondary | ICD-10-CM

## 2024-05-16 ENCOUNTER — Telehealth: Payer: Self-pay | Admitting: Orthopedic Surgery

## 2024-05-16 NOTE — Telephone Encounter (Signed)
 Pt request auth for another gel injection

## 2024-05-20 NOTE — Telephone Encounter (Signed)
 Patient called triage line requesting gel injections, bilateral knees. I advised you submitted for authorization today and it may take a week or so to hear back from insurance. Explained that some insurances approve quickly, however, some may take a little longer. She expressed understanding.

## 2024-05-20 NOTE — Telephone Encounter (Signed)
 VOB submitted for Orthovisc, bilateral knee

## 2024-05-21 ENCOUNTER — Other Ambulatory Visit: Payer: Self-pay

## 2024-05-21 DIAGNOSIS — M1712 Unilateral primary osteoarthritis, left knee: Secondary | ICD-10-CM

## 2024-05-21 DIAGNOSIS — M1711 Unilateral primary osteoarthritis, right knee: Secondary | ICD-10-CM

## 2024-05-21 NOTE — Telephone Encounter (Signed)
 Called and left a VM for patient to CB to schedule 3 appts.with Dr. Addie or Herlene for gel injections  See referrals tab

## 2024-06-19 ENCOUNTER — Ambulatory Visit: Admitting: Surgical

## 2024-06-19 ENCOUNTER — Encounter: Payer: Self-pay | Admitting: Orthopedic Surgery

## 2024-06-19 DIAGNOSIS — M1712 Unilateral primary osteoarthritis, left knee: Secondary | ICD-10-CM

## 2024-06-19 DIAGNOSIS — M170 Bilateral primary osteoarthritis of knee: Secondary | ICD-10-CM | POA: Diagnosis not present

## 2024-06-19 DIAGNOSIS — M1711 Unilateral primary osteoarthritis, right knee: Secondary | ICD-10-CM

## 2024-06-19 MED ORDER — HYALURONAN 30 MG/2ML IX SOSY
30.0000 mg | PREFILLED_SYRINGE | INTRA_ARTICULAR | Status: AC | PRN
  Administered 2024-06-19 (×2): 30 mg via INTRA_ARTICULAR

## 2024-06-19 MED ORDER — LIDOCAINE HCL 1 % IJ SOLN
5.0000 mL | INTRAMUSCULAR | Status: AC | PRN
  Administered 2024-06-19 (×2): 5 mL

## 2024-06-19 NOTE — Progress Notes (Signed)
   Procedure Note  Patient: Miranda Taylor             Date of Birth: 02-20-1961           MRN: [id number]             Visit Date: 06/19/2024  Procedures: Visit Diagnoses:  1. Unilateral primary osteoarthritis, right knee   2. Unilateral primary osteoarthritis, left knee     Large Joint Inj: bilateral knee on 06/19/2024 11:43 AM Indications: diagnostic evaluation, joint swelling and pain Details: 18 G 1.5 in needle, superolateral approach  Arthrogram: No  Medications (Right): 5 mL lidocaine  1 %; 30 mg Hyaluronan 30 MG/2ML Medications (Left): 5 mL lidocaine  1 %; 30 mg Hyaluronan 30 MG/2ML Outcome: tolerated well, no immediate complications Procedure, treatment alternatives, risks and benefits explained, specific risks discussed. Consent was given by the patient. Immediately prior to procedure a time out was called to verify the correct patient, procedure, equipment, support staff and site/side marked as required. Patient was prepped and draped in the usual sterile fashion.

## 2024-06-26 ENCOUNTER — Ambulatory Visit: Admitting: Orthopedic Surgery

## 2024-06-26 DIAGNOSIS — M1711 Unilateral primary osteoarthritis, right knee: Secondary | ICD-10-CM

## 2024-06-26 DIAGNOSIS — M1712 Unilateral primary osteoarthritis, left knee: Secondary | ICD-10-CM

## 2024-06-27 ENCOUNTER — Encounter: Payer: Self-pay | Admitting: Orthopedic Surgery

## 2024-06-27 NOTE — Progress Notes (Signed)
   Procedure Note  Patient: Miranda Taylor             Date of Birth: 1960/12/17           MRN: [id number]             Visit Date: 06/26/2024  Procedures: Visit Diagnoses:  1. Unilateral primary osteoarthritis, right knee   2. Unilateral primary osteoarthritis, left knee     Large Joint Inj: R knee on 06/30/2024 8:44 AM Indications: diagnostic evaluation, joint swelling and pain Details: 18 G 1.5 in needle, superolateral approach  Arthrogram: No  Medications: 5 mL lidocaine  1 %; 4 mL bupivacaine  0.25 %; 40 mg triamcinolone  acetonide 40 MG/ML; 30 mg Hyaluronan 30 MG/2ML Outcome: tolerated well, no immediate complications Procedure, treatment alternatives, risks and benefits explained, specific risks discussed. Consent was given by the patient. Immediately prior to procedure a time out was called to verify the correct patient, procedure, equipment, support staff and site/side marked as required. Patient was prepped and draped in the usual sterile fashion.     Lot #[id number]

## 2024-06-30 DIAGNOSIS — M1711 Unilateral primary osteoarthritis, right knee: Secondary | ICD-10-CM | POA: Diagnosis not present

## 2024-06-30 MED ORDER — LIDOCAINE HCL 1 % IJ SOLN
5.0000 mL | INTRAMUSCULAR | Status: AC | PRN
  Administered 2024-06-30: 5 mL

## 2024-06-30 MED ORDER — TRIAMCINOLONE ACETONIDE 40 MG/ML IJ SUSP
40.0000 mg | INTRAMUSCULAR | Status: AC | PRN
  Administered 2024-06-30: 40 mg via INTRA_ARTICULAR

## 2024-06-30 MED ORDER — HYALURONAN 30 MG/2ML IX SOSY
30.0000 mg | PREFILLED_SYRINGE | INTRA_ARTICULAR | Status: AC | PRN
  Administered 2024-06-30: 30 mg via INTRA_ARTICULAR

## 2024-06-30 MED ORDER — BUPIVACAINE HCL 0.25 % IJ SOLN
4.0000 mL | INTRAMUSCULAR | Status: AC | PRN
Start: 2024-06-30 — End: 2024-06-30
  Administered 2024-06-30: 4 mL via INTRA_ARTICULAR

## 2024-07-03 ENCOUNTER — Ambulatory Visit: Admitting: Orthopedic Surgery

## 2024-07-10 ENCOUNTER — Ambulatory Visit (INDEPENDENT_AMBULATORY_CARE_PROVIDER_SITE_OTHER): Admitting: Orthopedic Surgery

## 2024-07-10 ENCOUNTER — Encounter: Payer: Self-pay | Admitting: Orthopedic Surgery

## 2024-07-10 DIAGNOSIS — M1711 Unilateral primary osteoarthritis, right knee: Secondary | ICD-10-CM

## 2024-07-10 DIAGNOSIS — M1712 Unilateral primary osteoarthritis, left knee: Secondary | ICD-10-CM

## 2024-07-10 MED ORDER — HYALURONAN 30 MG/2ML IX SOSY
30.0000 mg | PREFILLED_SYRINGE | INTRA_ARTICULAR | Status: AC | PRN
  Administered 2024-07-10: 30 mg via INTRA_ARTICULAR

## 2024-07-10 NOTE — Progress Notes (Signed)
   Procedure Note  Patient: Miranda Taylor             Date of Birth: 04/26/1961           MRN: [id number]             Visit Date: 07/10/2024  Procedures: Visit Diagnoses: No diagnosis found.  Large Joint Inj: bilateral knee on 07/10/2024 9:31 AM Indications: diagnostic evaluation, joint swelling and pain Details: 18 G 1.5 in needle, superolateral approach  Arthrogram: No  Medications (Right): 30 mg Hyaluronan 30 MG/2ML Medications (Left): 30 mg Hyaluronan 30 MG/2ML Outcome: tolerated well, no immediate complications Procedure, treatment alternatives, risks and benefits explained, specific risks discussed. Consent was given by the patient. Immediately prior to procedure a time out was called to verify the correct patient, procedure, equipment, support staff and site/side marked as required. Patient was prepped and draped in the usual sterile fashion.     Lot [id number]

## 2024-08-20 ENCOUNTER — Other Ambulatory Visit: Payer: Self-pay

## 2024-09-09 ENCOUNTER — Encounter: Payer: Self-pay | Admitting: Radiology

## 2024-11-26 ENCOUNTER — Encounter: Payer: Self-pay | Admitting: Cardiovascular Disease

## 2024-11-26 ENCOUNTER — Ambulatory Visit: Admitting: Cardiovascular Disease

## 2024-11-26 VITALS — BP 134/76 | HR 65 | Ht 67.0 in | Wt 165.8 lb

## 2024-11-26 DIAGNOSIS — Z9889 Other specified postprocedural states: Secondary | ICD-10-CM

## 2024-11-26 DIAGNOSIS — Z8679 Personal history of other diseases of the circulatory system: Secondary | ICD-10-CM | POA: Diagnosis not present

## 2024-11-26 DIAGNOSIS — I4892 Unspecified atrial flutter: Secondary | ICD-10-CM

## 2024-11-26 DIAGNOSIS — I480 Paroxysmal atrial fibrillation: Secondary | ICD-10-CM

## 2024-11-26 NOTE — Progress Notes (Signed)
 "     11/26/2024 Miranda Taylor   1961-10-16  [id number]  Primary Physician Miranda Taylor Primary Cardiologist: Miranda Taylor, Miranda Taylor, [place], Miranda Taylor  HPI:  Miranda Taylor is a 64 y.o.   mildly overweight married Caucasian female, mother of 4 and grandmother to 7 grandchildren, whom I last saw in the office 10/24/2023.Miranda Taylor She has a history of symptomatic paroxysmal atrial fibrillation with a low CHAD score on beta-blocker and aspirin . She had a normal 2D echo and stress test. I did an event monitor that showed multiple episodes of PAF, which she is symptomatic from with complaints of shortness of breath and fatigue. I referred her to Dr. Agent Taylor at [facility], Electrophysiologist, who placed her on flecainide  50 p.o. b.i.d. empirically. Her symptoms have improved.she had symptoms compatible with obstructive sleep apnea when I last saw her 2 years ago and a subsequent outpatient sleep study did not suggest this. She was recently admitted to the hospital 02/03/15 for one week with A. Fib with RVR. A transesophageal echo showed reduced systolic function with 35-40% range with left atrial smoke/thrombus and therefore cardioversion was not performed. She was placed on amiodarone  and rate controlled and placed on oral anticoagulation. She subsequently converted o normal sinus rhythm. 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 Dusty which was performed in March 2019. Her postoperative echo revealed normal LV function. She feels clinically improved.    Since I saw her in the office 1 year ago she remains stable.  She continues to be in sinus rhythm.  She denies chest pain or shortness of breath.   Active Medications[1]   Allergies[2]  Social History   Socioeconomic History   Marital status: Married    Spouse name: Not on file   Number of  children: Not on file   Years of education: Not on file   Highest education level: Not on file  Occupational History   Occupation: Disabled  Tobacco Use   Smoking status: Former    Current packs/day: 0.00    Average packs/day: 0.5 packs/day for 20.0 years (10.0 ttl pk-yrs)    Types: Cigarettes    Start date: 11/07/1981    Quit date: 11/07/2001    Years since quitting: 23.0   Smokeless tobacco: Never  Vaping Use   Vaping status: Never Used  Substance and Sexual Activity   Alcohol use: No   Drug use: No   Sexual activity: Not on file  Other Topics Concern   Not on file  Social History Narrative   Pt lives with husband in [place].  Previously worked in firefighter but presently unemployed.  4 children   Social Drivers of Health   Tobacco Use: Medium Risk (11/26/2024)   Patient History    Smoking Tobacco Use: Former    Smokeless Tobacco Use: Never    Passive Exposure: Not on Actuary Strain: Not on file  Food Insecurity: Low Risk (02/19/2023)   Received from [facility]   Epic    Within the past 12 months, you worried that your food would run out before you got money to buy more: Not on file    Within the past 12 months, the food you bought just didn't last and you didn't have money to get more. : Never true  Transportation Needs: No Transportation Needs (02/19/2023)  Received from [facility]    In the past 12 months, has lack of reliable transportation kept you from medical appointments, meetings, work or from getting things needed for daily living? : No  Physical Activity: Not on file  Stress: Not on file  Social Connections: Not on file  Intimate Partner Violence: Not on file  Depression (EYV7-0): Not on file  Alcohol Screen: Not on file  Housing: Low Risk (02/19/2023)   Received from [facility]   Epic    What is your living situation today?: I have a steady place to live    Think about the place you live. Do you  have problems with any of the following? Choose all that apply:: None/None on this list  Utilities: Not on file  Health Literacy: Not on file     Review of Systems: General: negative for chills, fever, night sweats or weight changes.  Cardiovascular: negative for chest pain, dyspnea on exertion, edema, orthopnea, palpitations, paroxysmal nocturnal dyspnea or shortness of breath Dermatological: negative for rash Respiratory: negative for cough or wheezing Urologic: negative for hematuria Abdominal: negative for nausea, vomiting, diarrhea, bright red blood per rectum, melena, or hematemesis Neurologic: negative for visual changes, syncope, or dizziness All other systems reviewed and are otherwise negative except as noted above.    Blood pressure 134/76, pulse 65, height 5' 7 (1.702 m), weight 165 lb 12.8 oz (75.2 kg), last menstrual period 12/25/2012, SpO2 100%.  General appearance: alert and no distress Neck: no adenopathy, no carotid bruit, no JVD, supple, symmetrical, trachea midline, and thyroid not enlarged, symmetric, no tenderness/mass/nodules Lungs: clear to auscultation bilaterally Heart: regular rate and rhythm, S1, S2 normal, no murmur, click, rub or gallop Extremities: extremities normal, atraumatic, no cyanosis or edema Pulses: 2+ and symmetric Skin: Skin color, texture, turgor normal. No rashes or lesions Neurologic: Grossly normal  EKG EKG Interpretation Date/Time:  Tuesday November 26 2024 11:21:15 EST Ventricular Rate:  65 PR Interval:  154 QRS Duration:  70 QT Interval:  410 QTC Calculation: 426 R Axis:   19  Text Interpretation: Normal sinus rhythm Low voltage QRS When compared with ECG of 24-Oct-2023 08:52, No significant change was found Confirmed by Miranda Taylor [phone]) on 11/26/2024 11:39:22 AM    ASSESSMENT AND PLAN:   PAF (paroxysmal atrial fibrillation) (HCC) History of PAF maintaining sinus rhythm not currently on antiarrhythmic medication.  She  did have a left atrial Maze procedure performed by Dr. 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  S/P Minimally-invasive resection of LA papillary fibroelastoma and maze operation for atrial fibrillation Minimally invasive left atrial myxoma excision by Dr. Dusty March 2019.  Her last 2D echo performed 04/25/2018 revealed normal LV systolic function with a normal left atrium.     Taylor DOROTHA Court MD Taylor,FACC,FAHA, FSCAI 11/26/2024 11:45 AM    [1]  Current Meds  Medication Sig   desvenlafaxine  (PRISTIQ ) 100 MG 24 hr tablet Take 100 mg by mouth daily.   LORazepam  (ATIVAN ) 1 MG tablet Take 1 tablet (1 mg total) by mouth 2 times daily. (Patient taking differently: Take 1 mg by mouth daily at 6 (six) AM.)   Multiple Vitamin (MULITIVITAMIN WITH MINERALS) TABS Take 1 tablet by mouth daily.   NP THYROID 15 MG tablet Take 15 mg by mouth daily.   NP THYROID 30 MG tablet Take 30 mg by mouth daily.   oxyCODONE  (ROXICODONE ) 15 MG immediate release tablet Take  15 mg by mouth 2 (two) times daily as needed.  [2] No Known Allergies  "

## 2024-11-26 NOTE — Patient Instructions (Signed)

## 2024-11-26 NOTE — Assessment & Plan Note (Signed)
 Minimally invasive left atrial myxoma excision by Dr. Dusty March 2019.  Her last 2D echo performed 04/25/2018 revealed normal LV systolic function with a normal left atrium.

## 2024-11-26 NOTE — Assessment & Plan Note (Signed)
 History of PAF maintaining sinus rhythm not currently on antiarrhythmic medication.  She did have a left atrial Maze procedure performed by Dr. 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

## 2024-11-27 ENCOUNTER — Ambulatory Visit: Admitting: Podiatry

## 2024-11-27 ENCOUNTER — Ambulatory Visit (INDEPENDENT_AMBULATORY_CARE_PROVIDER_SITE_OTHER)

## 2024-11-27 ENCOUNTER — Encounter: Payer: Self-pay | Admitting: Podiatry

## 2024-11-27 DIAGNOSIS — M778 Other enthesopathies, not elsewhere classified: Secondary | ICD-10-CM | POA: Diagnosis not present

## 2024-11-27 DIAGNOSIS — M779 Enthesopathy, unspecified: Secondary | ICD-10-CM | POA: Diagnosis not present

## 2024-11-27 MED ORDER — TRIAMCINOLONE ACETONIDE 10 MG/ML IJ SUSP
10.0000 mg | Freq: Once | INTRAMUSCULAR | Status: AC
  Administered 2024-11-27: 10 mg via INTRA_ARTICULAR

## 2024-11-27 NOTE — Progress Notes (Signed)
 Subjective:   Patient ID: Miranda Taylor Agent, female   DOB: 64 y.o.   MRN: [id number]   HPI Patient presents with quite a bit of pain on the dorsum of the left foot and does not remember specific injury.  States its been relatively hard to walk on   ROS      Objective:  Physical Exam  Neurovascular status intact inflammation fluid of the dorsal left midtarsal joint of 1 month duration with mild edema in the area     Assessment:  Probability for a tendinitis or arthritis of the midtarsal joint left with inflammatory cycle     Plan:  H&P educated her on this reviewed x-rays.  Today I went ahead did sterile prep injected the dorsal tendon complex 3 mg dexamethasone  Kenalog  5 mg Xylocaine  I applied sterile dressing I instructed that this injection risk of rupture before we did it then I said that we may need to look at other modalities if symptoms were to persist or get worse.  Reappoint as symptoms indicate  X-rays indicate that there is no signs of fracture or advanced arthritis within these joint surfaces
